# Patient Record
Sex: Female | Born: 1970 | Race: Black or African American | Hispanic: No | Marital: Single | Smoking: Never smoker
Health system: Southern US, Community
[De-identification: ages and names within clinical notes are randomized; demographics above are authoritative.]

## PROBLEM LIST (undated history)

## (undated) DIAGNOSIS — R0600 Dyspnea, unspecified: Secondary | ICD-10-CM

## (undated) DIAGNOSIS — F32A Depression, unspecified: Secondary | ICD-10-CM

## (undated) DIAGNOSIS — Z9289 Personal history of other medical treatment: Secondary | ICD-10-CM

## (undated) DIAGNOSIS — R7303 Prediabetes: Secondary | ICD-10-CM

## (undated) DIAGNOSIS — Z803 Family history of malignant neoplasm of breast: Secondary | ICD-10-CM

## (undated) DIAGNOSIS — K219 Gastro-esophageal reflux disease without esophagitis: Secondary | ICD-10-CM

## (undated) DIAGNOSIS — Z9989 Dependence on other enabling machines and devices: Secondary | ICD-10-CM

## (undated) DIAGNOSIS — I519 Heart disease, unspecified: Secondary | ICD-10-CM

## (undated) DIAGNOSIS — F329 Major depressive disorder, single episode, unspecified: Secondary | ICD-10-CM

## (undated) DIAGNOSIS — B009 Herpesviral infection, unspecified: Secondary | ICD-10-CM

## (undated) DIAGNOSIS — Z8041 Family history of malignant neoplasm of ovary: Secondary | ICD-10-CM

## (undated) DIAGNOSIS — C50911 Malignant neoplasm of unspecified site of right female breast: Secondary | ICD-10-CM

## (undated) DIAGNOSIS — Z801 Family history of malignant neoplasm of trachea, bronchus and lung: Secondary | ICD-10-CM

## (undated) DIAGNOSIS — I951 Orthostatic hypotension: Secondary | ICD-10-CM

## (undated) DIAGNOSIS — I10 Essential (primary) hypertension: Secondary | ICD-10-CM

## (undated) DIAGNOSIS — F419 Anxiety disorder, unspecified: Secondary | ICD-10-CM

## (undated) DIAGNOSIS — T7840XA Allergy, unspecified, initial encounter: Secondary | ICD-10-CM

## (undated) DIAGNOSIS — L409 Psoriasis, unspecified: Secondary | ICD-10-CM

## (undated) HISTORY — DX: Essential (primary) hypertension: I10

## (undated) HISTORY — DX: Anxiety disorder, unspecified: F41.9

## (undated) HISTORY — DX: Psoriasis, unspecified: L40.9

## (undated) HISTORY — DX: Allergy, unspecified, initial encounter: T78.40XA

## (undated) HISTORY — DX: Malignant neoplasm of unspecified site of right female breast: C50.911

## (undated) HISTORY — DX: Depression, unspecified: F32.A

## (undated) HISTORY — DX: Orthostatic hypotension: I95.1

## (undated) HISTORY — DX: Major depressive disorder, single episode, unspecified: F32.9

## (undated) HISTORY — DX: Personal history of other medical treatment: Z92.89

## (undated) HISTORY — DX: Family history of malignant neoplasm of breast: Z80.3

## (undated) HISTORY — DX: Family history of malignant neoplasm of ovary: Z80.41

## (undated) HISTORY — DX: Gastro-esophageal reflux disease without esophagitis: K21.9

## (undated) HISTORY — DX: Heart disease, unspecified: I51.9

## (undated) HISTORY — DX: Family history of malignant neoplasm of trachea, bronchus and lung: Z80.1

## (undated) NOTE — *Deleted (*Deleted)
[facility]  [phone] [phone]     ID: Penny Kim DOB: 1971/06/20  MR#: [id number]  JYN#:[id number]  Patient Care Team: Maryella Shivers as PCP - General (Physician Assistant) End, Cristal Deer, MD as PCP - Cardiology (Cardiology) Magrinat, Valentino Hue, MD as Consulting Physician (Oncology) End, Cristal Deer, MD as Consulting Physician (Cardiology) Felecia Shelling, DPM as Consulting Physician (Podiatry) Kari Baars OTHER MD:  CHIEF COMPLAINT: estrogen receptor positive breast cancer (s/p bilateral mastectomies)  CURRENT TREATMENT: tamoxifen   INTERVAL HISTORY: Penny Kim returns today for follow-up of her estrogen receptor positive breast cancer.   She continues on tamoxifen.  She does not have significant problems from this medication.  She does have increased vaginal wetness and has to wear a pad.  She has some night time hot flashes.  Since her last visit, she underwent lumbar spine MRI on 03/28/2020 after pain from multiple falls. MRI showed: L4-5 left foraminal extrusion impinging on L4 nerve root.   REVIEW OF SYSTEMS: Penny Kim    COVID 19 VACCINATION STATUS:    HISTORY OF CURRENT ILLNESS: From the original intake note:  Penny Kim had noticed that her right breast dropped a little differently than the left, and occasionally she would have a strange sensation radiating to the right nipple, but she really did not pay much attention to this. She had her first mammogram ever, a screening mammogram at [facility], 04/23/2010, and this showed diffuse calcifications in the lower inner quadrant of the right breast measuring up to 5.6 cm. She was recalled 08/08, and Dr. Samuella Cota found the breast to be dense, which limits mammographic sensitivity, but again was able to demonstrate these calcifications throughout the lower inner quadrant of the right breast. Biopsy was discussed, and performed 08/09, and this showed [phone]) ductal carcinoma  in situ, high grade, which was estrogen receptor 100% and progesterone receptor 57% positive.  Bilateral breast MRIs were obtained 08/14. This showed in the right breast the area of enhancement to total 7.7 cm anterior posteriorly. Most of this was nodular and non-mass like, but in addition in the anterior aspect of this, there was a rounded area of mass-like enhancement measuring 1.7 cm. This portion was suspicious for invasive ductal carcinoma. The left breast was unremarkable, and there were no suspicious internal mammary or axillary lymph nodes noted.  Accordingly the patient was brought back for biopsy of the more mass-like area on 05/05/2010, and this biopsy ([id number]) showed a grade 2 invasive ductal carcinoma, which was estrogen receptor at 78% and progesterone receptor 82% positive. The proliferation marker was elevated at 85%. 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  Her subsequent history is as detailed below.   PAST MEDICAL HISTORY: Past Medical History:  Diagnosis Date  . Allergy   . Anxiety   . Breast cancer, right breast (HCC) 02/2010   s/p chemo (completed 09/2010) and right mastectomy w/reconstruction  . Depression   . Family history of breast cancer   . Family history of lung cancer   . Family history of ovarian cancer   . History of stress test    a. treadmill Myoview 11/2016: small defect of mild severity present in the apex location felt to be secondary to breast attenuation. EF 55-65%. Normal study  . HTN (hypertension)   . Orthostatic hypotension   . Psoriasis   . Systolic dysfunction    a. TTE 10/2016: EF 50-55%, no RWMA, normal LV diastolic function, left atrium normal,  RV systolic function normal, PASP normal    PAST SURGICAL HISTORY: Past Surgical History:  Procedure Laterality Date  . BREAST RECONSTRUCTION  01/06/2012   Procedure: BREAST RECONSTRUCTION;  Surgeon: Etter Sjogren, MD;  Location: [facility];   Service: Plastics;  Laterality: Bilateral;  bilateral removal of tissue expanders, bilateral placement of implants--Breast  . BREAST SURGERY  06/19/2010   exploration of right mastectomy site due to post-op bleeding  . INSERTION OF TISSUE EXPANDER AFTER MASTECTOMY  06/19/2010   bilat. mastectomies with bilat. tissue expanders  . PORT-A-CATH REMOVAL  11/26/2010  . PORTACATH PLACEMENT  07/23/2010  . TISSUE EXPANDER REMOVAL  10/20/2010   left    FAMILY HISTORY: Family History  Problem Relation Age of Onset  . Hypertension Mother   . Depression Mother   . Heart attack Mother   . Hyperlipidemia Mother   . Cancer Sister 30       Ovarian cancer  . Hypertension Maternal Grandmother   . Diabetes Maternal Grandmother   . Cancer Maternal Grandfather        Lung cancer - Education officer, environmental and smoker  . Hypertension Maternal Grandfather   . Diabetes Paternal Grandmother   . Hypertension Paternal Grandmother   . Kidney disease Paternal Grandmother        End stage renal dz - dialysis  . Hyperlipidemia Paternal Grandmother   . Breast cancer Paternal Grandmother   . Alcohol abuse Father   . Depression Father   . Drug abuse Father 69       Died of drug overdose  The patient's father died at the age of 37 from drug overdose in the setting of ETOH abuse. The patient's mother died in 13-Mar-2016 at age 43. She has a significant history of depression. The patient has one sister, also with a history of depression, who was diagnosed with ovarian cancer at age 34.   GYNECOLOGIC HISTORY:  No LMP recorded. (Menstrual status: Other). GX P 0 LMP irregular following chemotherapy (since 10/2013) Hysterectomy? no BSO? no   SOCIAL HISTORY: (updated 06/2019)  Penny Kim is currently on disability.When asked who she would call in case of a problem, she really does not have anyone that she would call. She does not feel her family is supportive, and she has no close friends.     ADVANCED DIRECTIVES: not in place    HEALTH MAINTENANCE: Social History   Tobacco Use  . Smoking status: Never Smoker  . Smokeless tobacco: Never Used  Vaping Use  . Vaping Use: Never used  Substance Use Topics  . Alcohol use: No    Comment: occasionally  . Drug use: No     Colonoscopy: n/a  PAP: 11/2017, negative  Bone density: n/a   Allergies  Allergen Reactions  . Prochlorperazine Other (See Comments)    SYNCOPE SYNCOPE  . Methadone Hcl Itching  . Other   . Propranolol     rash  . Tape Rash    Current Outpatient Medications  Medication Sig Dispense Refill  . amLODipine (NORVASC) 10 MG tablet Take 1 tablet (10 mg total) by mouth daily. 90 tablet 3  . busPIRone (BUSPAR) 10 MG tablet Take 2 tablets (20 mg total) by mouth 3 (three) times daily. 540 tablet 0  . clobetasol ointment (TEMOVATE) 0.05 % Apply 1 application topically 2 (two) times daily. 60 g 3  . cyclobenzaprine (FLEXERIL) 10 MG tablet Take 10 mg by mouth at bedtime.    . fluticasone (FLONASE) 50 MCG/ACT nasal  spray SPRAY 2 SPRAYS INTO EACH NOSTRIL EVERY DAY 48 mL 0  . hydrochlorothiazide (MICROZIDE) 12.5 MG capsule TAKE 1 CAPSULE BY MOUTH EVERY DAY 90 capsule 3  . meloxicam (MOBIC) 7.5 MG tablet Take 1 tablet (7.5 mg total) by mouth daily. 30 tablet 0  . methocarbamol (ROBAXIN) 500 MG tablet Take 1 tablet (500 mg total) by mouth 2 (two) times daily as needed for up to 15 days for muscle spasms. 30 tablet 0  . tamoxifen (NOLVADEX) 20 MG tablet Take 1 tablet (20 mg total) by mouth daily. 90 tablet 4  . traMADol (ULTRAM) 50 MG tablet Take 1 tablet (50 mg total) by mouth every 8 (eight) hours as needed. 6 tablet 0  . traZODone (DESYREL) 100 MG tablet TAKE 1 TABLET BY MOUTH EVERYDAY AT BEDTIME 90 tablet 0  . vortioxetine HBr (TRINTELLIX) 20 MG TABS tablet Take 1 tablet (20 mg total) by mouth daily. 90 tablet 1   No current facility-administered medications for this visit.    OBJECTIVE: Young African-American Kim using a cane  There were no  vitals filed for this visit.   There is no height or weight on file to calculate BMI.   Wt Readings from Last 3 Encounters:  07/15/20 138 lb (62.6 kg)  01/23/20 146 lb (66.2 kg)  11/23/19 150 lb (68 kg)      ECOG FS:3 - Symptomatic, >50% confined to bed  Sclerae unicteric, EOMs intact Wearing a mask No cervical or supraclavicular adenopathy Lungs no rales or rhonchi Heart regular rate and rhythm Abd soft, nontender, positive bowel sounds MSK no focal spinal tenderness, no upper extremity lymphedema Neuro: nonfocal, well oriented, appropriate affect Breasts:    {Ocular: Sclerae unicteric, EOMs intact Ear-nose-throat: Using a mask Lymphatic: No cervical or supraclavicular adenopathy Lungs no rales or rhonchi Heart regular rate and rhythm Abd soft, nontender, positive bowel sounds MSK no focal spinal tenderness, no joint edema Neuro: non-focal, well-oriented, flat affect, somewhat slow responses Breasts: Status post bilateral mastectomies with bilateral implant reconstruction.  There is no evidence of chest wall recurrence.  Both axillae are benign. }   LAB RESULTS:  CMP     Component Value Date/Time   NA 136 01/23/2020 1419   NA 138 10/13/2015 1406   K 3.4 (L) 01/23/2020 1419   K 3.6 10/13/2015 1406   CL 96 01/23/2020 1419   CL 105 01/30/2013 1313   CO2 22 01/23/2020 1419   CO2 27 10/13/2015 1406   GLUCOSE 102 (H) 01/23/2020 1419   GLUCOSE 115 (H) 11/23/2019 1229   GLUCOSE 114 10/13/2015 1406   GLUCOSE 94 01/30/2013 1313   BUN 9 01/23/2020 1419   BUN 10.2 10/13/2015 1406   CREATININE 1.05 (H) 01/23/2020 1419   CREATININE 1.0 10/13/2015 1406   CALCIUM 10.0 01/23/2020 1419   CALCIUM 9.5 10/13/2015 1406   PROT 7.8 01/23/2020 1419   PROT 7.4 10/13/2015 1406   ALBUMIN 4.7 01/23/2020 1419   ALBUMIN 4.0 10/13/2015 1406   AST 24 01/23/2020 1419   AST 22 10/13/2015 1406   ALT 15 01/23/2020 1419   ALT 15 10/13/2015 1406   ALKPHOS 48 01/23/2020 1419   ALKPHOS 38 (L)  10/13/2015 1406   BILITOT 0.3 01/23/2020 1419   BILITOT <0.30 10/13/2015 1406   GFRNONAA 63 01/23/2020 1419   GFRAA 73 01/23/2020 1419    Lab Results  Component Value Date   WBC 7.9 01/23/2020   NEUTROABS 5.1 01/23/2020   HGB 11.6 01/23/2020   HCT  34.5 01/23/2020   MCV 81 01/23/2020   PLT 291 01/23/2020   Lab Results  Component Value Date   LABCA2 11 09/25/2012    No components found for: [id number]  No results for input(s): INR in the last 168 hours.  Lab Results  Component Value Date   LABCA2 11 09/25/2012    No results found for: WUJ811  No results found for: BJY782  No results found for: NFA213  No results found for: CA2729  No components found for: HGQUANT  No results found for: CEA1 / No results found for: CEA1   No results found for: AFPTUMOR  No results found for: CHROMOGRNA  No results found for: TOTALPROTELP, ALBUMINELP, A1GS, A2GS, BETS, BETA2SER, GAMS, MSPIKE, SPEI (this displays SPEP labs)  No results found for: KPAFRELGTCHN, LAMBDASER, KAPLAMBRATIO (kappa/lambda light chains)  No results found for: HGBA, HGBA2QUANT, HGBFQUANT, HGBSQUAN (Hemoglobinopathy evaluation)   No results found for: LDH  No results found for: IRON, TIBC, IRONPCTSAT (Iron and TIBC)  No results found for: FERRITIN  Urinalysis    Component Value Date/Time   COLORURINE YELLOW (A) 11/17/2019 2054   APPEARANCEUR CLEAR (A) 11/17/2019 2054   LABSPEC 1.014 11/17/2019 2054   LABSPEC 1.005 08/07/2013 1155   PHURINE 6.0 11/17/2019 2054   GLUCOSEU NEGATIVE 11/17/2019 2054   GLUCOSEU Negative 08/07/2013 1155   HGBUR NEGATIVE 11/17/2019 2054   BILIRUBINUR NEGATIVE 11/17/2019 2054   BILIRUBINUR Negative 08/07/2013 1155   KETONESUR NEGATIVE 11/17/2019 2054   PROTEINUR NEGATIVE 11/17/2019 2054   UROBILINOGEN 0.2 08/07/2013 1155   NITRITE NEGATIVE 11/17/2019 2054   LEUKOCYTESUR NEGATIVE 11/17/2019 2054   LEUKOCYTESUR Negative 08/07/2013 1155    STUDIES: No results  found.   ASSESSMENT: 89 y.o. 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   (3) She did not require radiation.   (4) She started tamoxifenin March 2012          (a) stopped tamoxifen January 2018  (b) resumed tamoxifen January 2019  (5) genetic testing on 06/27/2018  (a) Previous BRCA2 VUS was reclassified to favor polymorphism on 05/06/2014.  The remainder of the BRCA1 and BRCA2 genes were negative through [facility].  No BART.   (b)Updated genetic testing 07/05/2018-through the [facility] gene panel offered by [employer] found no deleterious mutations in APC, ATM, BARD1, BMPR1A, BRCA1, BRCA2, BRIP1, CHD1, CDK4, CDKN2A, CHEK2, EPCAM (large rearrangement only), GREM1, HOXB13, AXIN2, MSH3, NTHL1, RNF43, GALNT12, RSP20, MLH1, MSH2, MSH6, MUTYH, NBN, PALB2, PMS2, PTEN, RAD51C, RAD51D, SMAD4, STK11, and TP53. Sequencing was performed for select regions of POLE and POLD1, and large rearrangement analysis was performed for select regions of GREM1.   PLAN: Penny Kim is now just over 9 years out from definitive surgery for her breast cancer with no evidence of disease recurrence.  This is very favorable.  The plan is to continue tamoxifen through March 2023.  She is tolerating that generally well.  She has multiple issues including avascular necrosis of both hips, significant limitations secondary to her psychological/mood problems, and other concerns listed in the review of systems above which unfortunately do severely limit her functional capacity.  She brought a  formal disability evaluation form to  me.  I explained that I do not do such things as measuring what reach she has or how long she can stand etc.  I do think that in my opinion it would not be possible for her to hold a job that requires any physical strength or persistent mental acuity.  She will see me again in 1 year.  She knows to call for any other issue that may develop before then.   Kari Baars   07/25/2020 12:27 AM Medical Oncology and Hematology [facility] [address] [place], [place] [address] Tel. [phone]    Fax. [phone]   I, Mickie Bail, am acting as scribe for Dr. Valentino Hue. Magrinat.  I, Ruthann Cancer MD, have reviewed the above documentation for accuracy and completeness, and I agree with the above.   *Total Encounter Time as defined by the Centers for Medicare and Medicaid Services includes, in addition to the face-to-face time of a patient visit (documented in the note above) non-face-to-face time: obtaining and reviewing outside history, ordering and reviewing medications, tests or procedures, care coordination (communications with other health care professionals or caregivers) and documentation in the medical record.

## (undated) NOTE — *Deleted (*Deleted)
[facility]  [phone] Fax:([phone]     ID: Penny Kim DOB: 10-08-1970  MR#: [id number]  JYN#:[id number]  Patient Care Team: Maryella Shivers as PCP - General (Physician Assistant) End, Cristal Deer, MD as PCP - Cardiology (Cardiology) Magrinat, Valentino Hue, MD as Consulting Physician (Oncology) End, Cristal Deer, MD as Consulting Physician (Cardiology) Felecia Shelling, DPM as Consulting Physician (Podiatry) Kari Baars OTHER MD:  CHIEF COMPLAINT: estrogen receptor positive breast cancer (s/p bilateral mastectomies)  CURRENT TREATMENT: tamoxifen   INTERVAL HISTORY: Penny Kim returns today for follow-up of her estrogen receptor positive breast cancer.   She continues on tamoxifen.  She does not have significant problems from this medication.  She does have increased vaginal wetness and has to wear a pad.  She has some night time hot flashes.  Since her last visit, she underwent head CT on 11/23/2019 for multiple falls showing no acute findings.  She also underwent lumbar spine MRI on 03/28/2020 for back pain showing: L4-5 left foraminal extrusion impinging on L4 nerve root.   REVIEW OF SYSTEMS: Penny Kim    COVID 19 VACCINATION STATUS:    HISTORY OF CURRENT ILLNESS: From the original intake note:  Penny Kim had noticed that her right breast dropped a little differently than the left, and occasionally she would have a strange sensation radiating to the right nipple, but she really did not pay much attention to this. She had her first mammogram ever, a screening mammogram at [facility], 04/23/2010, and this showed diffuse calcifications in the lower inner quadrant of the right breast measuring up to 5.6 cm. She was recalled 08/08, and Dr. Samuella Cota found the breast to be dense, which limits mammographic sensitivity, but again was able to demonstrate these calcifications throughout the lower inner quadrant of the right breast. Biopsy was discussed, and  performed 08/09, and this showed [phone]) ductal carcinoma in situ, high grade, which was estrogen receptor 100% and progesterone receptor 57% positive.  Bilateral breast MRIs were obtained 08/14. This showed in the right breast the area of enhancement to total 7.7 cm anterior posteriorly. Most of this was nodular and non-mass like, but in addition in the anterior aspect of this, there was a rounded area of mass-like enhancement measuring 1.7 cm. This portion was suspicious for invasive ductal carcinoma. The left breast was unremarkable, and there were no suspicious internal mammary or axillary lymph nodes noted.  Accordingly the patient was brought back for biopsy of the more mass-like area on 05/05/2010, and this biopsy ([id number]) showed a grade 2 invasive ductal carcinoma, which was estrogen receptor at 78% and progesterone receptor 82% positive. The proliferation marker was elevated at 85%. 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  Her subsequent history is as detailed below.   PAST MEDICAL HISTORY: Past Medical History:  Diagnosis Date  . Allergy   . Anxiety   . Breast cancer, right breast (HCC) 02/2010   s/p chemo (completed 09/2010) and right mastectomy w/reconstruction  . Depression   . Family history of breast cancer   . Family history of lung cancer   . Family history of ovarian cancer   . History of stress test    a. treadmill Myoview 11/2016: small defect of mild severity present in the apex location felt to be secondary to breast attenuation. EF 55-65%. Normal study  . HTN (hypertension)   . Orthostatic hypotension   . Psoriasis   . Systolic dysfunction    a. TTE  10/2016: EF 50-55%, no RWMA, normal LV diastolic function, left atrium normal, RV systolic function normal, PASP normal    PAST SURGICAL HISTORY: Past Surgical History:  Procedure Laterality Date  . BREAST RECONSTRUCTION  01/06/2012   Procedure: BREAST RECONSTRUCTION;   Surgeon: Etter Sjogren, MD;  Location: [facility];  Service: Plastics;  Laterality: Bilateral;  bilateral removal of tissue expanders, bilateral placement of implants--Breast  . BREAST SURGERY  06/19/2010   exploration of right mastectomy site due to post-op bleeding  . INSERTION OF TISSUE EXPANDER AFTER MASTECTOMY  06/19/2010   bilat. mastectomies with bilat. tissue expanders  . PORT-A-CATH REMOVAL  11/26/2010  . PORTACATH PLACEMENT  07/23/2010  . TISSUE EXPANDER REMOVAL  10/20/2010   left    FAMILY HISTORY: Family History  Problem Relation Age of Onset  . Hypertension Mother   . Depression Mother   . Heart attack Mother   . Hyperlipidemia Mother   . Cancer Sister 30       Ovarian cancer  . Hypertension Maternal Grandmother   . Diabetes Maternal Grandmother   . Cancer Maternal Grandfather        Lung cancer - Education officer, environmental and smoker  . Hypertension Maternal Grandfather   . Diabetes Paternal Grandmother   . Hypertension Paternal Grandmother   . Kidney disease Paternal Grandmother        End stage renal dz - dialysis  . Hyperlipidemia Paternal Grandmother   . Breast cancer Paternal Grandmother   . Alcohol abuse Father   . Depression Father   . Drug abuse Father 57       Died of drug overdose  The patient's father died at the age of 8 from drug overdose in the setting of ETOH abuse. The patient's mother died in 01-Mar-2016 at age 21. She has a significant history of depression. The patient has one sister, also with a history of depression, who was diagnosed with ovarian cancer at age 86.   GYNECOLOGIC HISTORY:  No LMP recorded. (Menstrual status: Other). GX P 0 LMP irregular following chemotherapy (since 10/2013) Hysterectomy? no BSO? no   SOCIAL HISTORY: (updated 06/2019)  Penny Kim is currently on disability.When asked who she would call in case of a problem, she really does not have anyone that she would call. She does not feel her family is supportive, and she has  no close friends.     ADVANCED DIRECTIVES: not in place   HEALTH MAINTENANCE: Social History   Tobacco Use  . Smoking status: Never Smoker  . Smokeless tobacco: Never Used  Vaping Use  . Vaping Use: Never used  Substance Use Topics  . Alcohol use: No    Comment: occasionally  . Drug use: No     Colonoscopy: n/a  PAP: 11/2017, negative  Bone density: n/a   Allergies  Allergen Reactions  . Prochlorperazine Other (See Comments)    SYNCOPE SYNCOPE  . Methadone Hcl Itching  . Other   . Propranolol     rash  . Tape Rash    Current Outpatient Medications  Medication Sig Dispense Refill  . amLODipine (NORVASC) 10 MG tablet Take 1 tablet (10 mg total) by mouth daily. 90 tablet 3  . busPIRone (BUSPAR) 10 MG tablet Take 2 tablets (20 mg total) by mouth 3 (three) times daily. 540 tablet 0  . clobetasol ointment (TEMOVATE) 0.05 % Apply 1 application topically 2 (two) times daily. 60 g 3  . cyclobenzaprine (FLEXERIL) 10 MG tablet Take 10 mg by  mouth at bedtime.    . fluticasone (FLONASE) 50 MCG/ACT nasal spray SPRAY 2 SPRAYS INTO EACH NOSTRIL EVERY DAY 48 mL 0  . hydrochlorothiazide (MICROZIDE) 12.5 MG capsule TAKE 1 CAPSULE BY MOUTH EVERY DAY 90 capsule 3  . meloxicam (MOBIC) 7.5 MG tablet Take 1 tablet (7.5 mg total) by mouth daily. 30 tablet 0  . tamoxifen (NOLVADEX) 20 MG tablet Take 1 tablet (20 mg total) by mouth daily. 90 tablet 4  . traMADol (ULTRAM) 50 MG tablet Take 1 tablet (50 mg total) by mouth every 8 (eight) hours as needed. 6 tablet 0  . traZODone (DESYREL) 100 MG tablet TAKE 1 TABLET BY MOUTH EVERYDAY AT BEDTIME 90 tablet 0  . vortioxetine HBr (TRINTELLIX) 20 MG TABS tablet Take 1 tablet (20 mg total) by mouth daily. 90 tablet 1   No current facility-administered medications for this visit.    OBJECTIVE: Young African-American woman using a cane  There were no vitals filed for this visit.   There is no height or weight on file to calculate BMI.   Wt Readings  from Last 3 Encounters:  07/15/20 138 lb (62.6 kg)  01/23/20 146 lb (66.2 kg)  11/23/19 150 lb (68 kg)      ECOG FS:3 - Symptomatic, >50% confined to bed  Sclerae unicteric, EOMs intact Wearing a mask No cervical or supraclavicular adenopathy Lungs no rales or rhonchi Heart regular rate and rhythm Abd soft, nontender, positive bowel sounds MSK no focal spinal tenderness, no upper extremity lymphedema Neuro: nonfocal, well oriented, appropriate affect Breasts:    {Ocular: Sclerae unicteric, EOMs intact Ear-nose-throat: Using a mask Lymphatic: No cervical or supraclavicular adenopathy Lungs no rales or rhonchi Heart regular rate and rhythm Abd soft, nontender, positive bowel sounds MSK no focal spinal tenderness, no joint edema Neuro: non-focal, well-oriented, flat affect, somewhat slow responses Breasts: Status post bilateral mastectomies with bilateral implant reconstruction.  There is no evidence of chest wall recurrence.  Both axillae are benign. }   LAB RESULTS:  CMP     Component Value Date/Time   NA 136 01/23/2020 1419   NA 138 10/13/2015 1406   K 3.4 (L) 01/23/2020 1419   K 3.6 10/13/2015 1406   CL 96 01/23/2020 1419   CL 105 01/30/2013 1313   CO2 22 01/23/2020 1419   CO2 27 10/13/2015 1406   GLUCOSE 102 (H) 01/23/2020 1419   GLUCOSE 115 (H) 11/23/2019 1229   GLUCOSE 114 10/13/2015 1406   GLUCOSE 94 01/30/2013 1313   BUN 9 01/23/2020 1419   BUN 10.2 10/13/2015 1406   CREATININE 1.05 (H) 01/23/2020 1419   CREATININE 1.0 10/13/2015 1406   CALCIUM 10.0 01/23/2020 1419   CALCIUM 9.5 10/13/2015 1406   PROT 7.8 01/23/2020 1419   PROT 7.4 10/13/2015 1406   ALBUMIN 4.7 01/23/2020 1419   ALBUMIN 4.0 10/13/2015 1406   AST 24 01/23/2020 1419   AST 22 10/13/2015 1406   ALT 15 01/23/2020 1419   ALT 15 10/13/2015 1406   ALKPHOS 48 01/23/2020 1419   ALKPHOS 38 (L) 10/13/2015 1406   BILITOT 0.3 01/23/2020 1419   BILITOT <0.30 10/13/2015 1406   GFRNONAA 63  01/23/2020 1419   GFRAA 73 01/23/2020 1419    Lab Results  Component Value Date   WBC 7.9 01/23/2020   NEUTROABS 5.1 01/23/2020   HGB 11.6 01/23/2020   HCT 34.5 01/23/2020   MCV 81 01/23/2020   PLT 291 01/23/2020    Lab Results  Component Value  Date   LABCA2 11 09/25/2012    No components found for: [id number]  No results for input(s): INR in the last 168 hours.  Lab Results  Component Value Date   LABCA2 11 09/25/2012    No results found for: WUJ811  No results found for: BJY782  No results found for: NFA213  No results found for: CA2729  No components found for: HGQUANT  No results found for: CEA1 / No results found for: CEA1   No results found for: AFPTUMOR  No results found for: CHROMOGRNA  No results found for: TOTALPROTELP, ALBUMINELP, A1GS, A2GS, BETS, BETA2SER, GAMS, MSPIKE, SPEI (this displays SPEP labs)  No results found for: KPAFRELGTCHN, LAMBDASER, KAPLAMBRATIO (kappa/lambda light chains)  No results found for: HGBA, HGBA2QUANT, HGBFQUANT, HGBSQUAN (Hemoglobinopathy evaluation)   No results found for: LDH  No results found for: IRON, TIBC, IRONPCTSAT (Iron and TIBC)  No results found for: FERRITIN  Urinalysis    Component Value Date/Time   COLORURINE YELLOW (A) 11/17/2019 2054   APPEARANCEUR CLEAR (A) 11/17/2019 2054   LABSPEC 1.014 11/17/2019 2054   LABSPEC 1.005 08/07/2013 1155   PHURINE 6.0 11/17/2019 2054   GLUCOSEU NEGATIVE 11/17/2019 2054   GLUCOSEU Negative 08/07/2013 1155   HGBUR NEGATIVE 11/17/2019 2054   BILIRUBINUR NEGATIVE 11/17/2019 2054   BILIRUBINUR Negative 08/07/2013 1155   KETONESUR NEGATIVE 11/17/2019 2054   PROTEINUR NEGATIVE 11/17/2019 2054   UROBILINOGEN 0.2 08/07/2013 1155   NITRITE NEGATIVE 11/17/2019 2054   LEUKOCYTESUR NEGATIVE 11/17/2019 2054   LEUKOCYTESUR Negative 08/07/2013 1155    STUDIES: No results found.   ASSESSMENT: 71 y.o. 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   (3) She did not require radiation.   (4) She started tamoxifenin March 2012          (a) stopped tamoxifen January 2018  (b) resumed tamoxifen January 2019  (5) genetic testing on 06/27/2018  (a) Previous BRCA2 VUS was reclassified to favor polymorphism on 05/06/2014.  The remainder of the BRCA1 and BRCA2 genes were negative through [facility].  No BART.   (b)Updated genetic testing 07/05/2018-through the [facility] gene panel offered by [facility] found no deleterious mutations in APC, ATM, BARD1, BMPR1A, BRCA1, BRCA2, BRIP1, CHD1, CDK4, CDKN2A, CHEK2, EPCAM (large rearrangement only), GREM1, HOXB13, AXIN2, MSH3, NTHL1, RNF43, GALNT12, RSP20, MLH1, MSH2, MSH6, MUTYH, NBN, PALB2, PMS2, PTEN, RAD51C, RAD51D, SMAD4, STK11, and TP53. Sequencing was performed for select regions of POLE and POLD1, and large rearrangement analysis was performed for select regions of GREM1.   PLAN: Penny Kim is now just over 9 years out from definitive surgery for her breast cancer with no evidence of disease recurrence.  This is very favorable.  The plan is to continue tamoxifen through March 2023.  She is tolerating that generally well.  She has multiple issues including avascular necrosis of both hips, significant limitations secondary to her psychological/mood problems, and other concerns listed in the review of systems above which unfortunately do severely limit her functional capacity.  She brought a formal disability evaluation form to me.  I explained that I do not do such things as  measuring what reach she has or how  long she can stand etc.  I do think that in my opinion it would not be possible for her to hold a job that requires any physical strength or persistent mental acuity.  She will see me again in 1 year.  She knows to call for any other issue that may develop before then.   Kari Baars   08/08/2020 3:12 AM Medical Oncology and Hematology [facility] [address] [place], [place] [address] Tel. [phone]    Fax. [phone]   I, Mickie Bail, am acting as scribe for Dr. Valentino Hue. Magrinat.  I, Ruthann Cancer MD, have reviewed the above documentation for accuracy and completeness, and I agree with the above.   *Total Encounter Time as defined by the Centers for Medicare and Medicaid Services includes, in addition to the face-to-face time of a patient visit (documented in the note above) non-face-to-face time: obtaining and reviewing outside history, ordering and reviewing medications, tests or procedures, care coordination (communications with other health care professionals or caregivers) and documentation in the medical record.

## (undated) NOTE — *Deleted (*Deleted)
[facility]  [phone] [phone]     ID: Penny Kim DOB: 07-14-1971  MR#: [id number]  QIO#:[id number]  Patient Care Team: Yvonne Kendall, MD as PCP - Cardiology (Cardiology) Magrinat, Valentino Hue, MD as Consulting Physician (Oncology) End, Cristal Deer, MD as Consulting Physician (Cardiology) Felecia Shelling, DPM as Consulting Physician (Podiatry) Kari Baars OTHER MD:  CHIEF COMPLAINT: estrogen receptor positive breast cancer (s/p bilateral mastectomies)  CURRENT TREATMENT: tamoxifen   INTERVAL HISTORY: Penny Kim returns today for follow-up of her estrogen receptor positive breast cancer.   She continues on tamoxifen.  She does not have significant problems from this medication.  She does have increased vaginal wetness and has to wear a pad.  She has some night time hot flashes.   REVIEW OF SYSTEMS: Penny Kim    COVID 19 VACCINATION STATUS:    HISTORY OF CURRENT ILLNESS: From the original intake note:  Blythe had noticed that her right breast dropped a little differently than the left, and occasionally she would have a strange sensation radiating to the right nipple, but she really did not pay much attention to this. She had her first mammogram ever, a screening mammogram at [facility], 04/23/2010, and this showed diffuse calcifications in the lower inner quadrant of the right breast measuring up to 5.6 cm. She was recalled 08/08, and Dr. Samuella Cota found the breast to be dense, which limits mammographic sensitivity, but again was able to demonstrate these calcifications throughout the lower inner quadrant of the right breast. Biopsy was discussed, and performed 08/09, and this showed [id number] ductal carcinoma in situ, high grade, which was estrogen receptor 100% and progesterone receptor 57% positive.  Bilateral breast MRIs were obtained 08/14. This showed in the right breast the area of enhancement to total 7.7 cm anterior  posteriorly. Most of this was nodular and non-mass like, but in addition in the anterior aspect of this, there was a rounded area of mass-like enhancement measuring 1.7 cm. This portion was suspicious for invasive ductal carcinoma. The left breast was unremarkable, and there were no suspicious internal mammary or axillary lymph nodes noted.  Accordingly the patient was brought back for biopsy of the more mass-like area on 05/05/2010, and this biopsy ([id number]) showed a grade 2 invasive ductal carcinoma, which was estrogen receptor at 78% and progesterone receptor 82% positive. The proliferation marker was elevated at 85%. 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  Her subsequent history is as detailed below.   PAST MEDICAL HISTORY: Past Medical History:  Diagnosis Date  . Allergy   . Anxiety   . Breast cancer, right breast (HCC) 02/2010   s/p chemo (completed 09/2010) and right mastectomy w/reconstruction  . Depression   . Family history of breast cancer   . Family history of lung cancer   . Family history of ovarian cancer   . History of stress test    a. treadmill Myoview 11/2016: small defect of mild severity present in the apex location felt to be secondary to breast attenuation. EF 55-65%. Normal study  . HTN (hypertension)   . Orthostatic hypotension   . Psoriasis   . Systolic dysfunction    a. TTE 10/2016: EF 50-55%, no RWMA, normal LV diastolic function, left atrium normal, RV systolic function normal, PASP normal    PAST SURGICAL HISTORY: Past Surgical History:  Procedure Laterality Date  . BREAST RECONSTRUCTION  01/06/2012   Procedure: BREAST RECONSTRUCTION;  Surgeon: Etter Sjogren, MD;  Location: [facility];  Service: Plastics;  Laterality: Bilateral;  bilateral removal of tissue expanders, bilateral placement of implants--Breast  . BREAST SURGERY  06/19/2010   exploration of right mastectomy site due to post-op bleeding  .  INSERTION OF TISSUE EXPANDER AFTER MASTECTOMY  06/19/2010   bilat. mastectomies with bilat. tissue expanders  . PORT-A-CATH REMOVAL  11/26/2010  . PORTACATH PLACEMENT  07/23/2010  . TISSUE EXPANDER REMOVAL  10/20/2010   left    FAMILY HISTORY: Family History  Problem Relation Age of Onset  . Hypertension Mother   . Depression Mother   . Heart attack Mother   . Hyperlipidemia Mother   . Cancer Sister 30       Ovarian cancer  . Hypertension Maternal Grandmother   . Diabetes Maternal Grandmother   . Cancer Maternal Grandfather        Lung cancer - Education officer, environmental and smoker  . Hypertension Maternal Grandfather   . Diabetes Paternal Grandmother   . Hypertension Paternal Grandmother   . Kidney disease Paternal Grandmother        End stage renal dz - dialysis  . Hyperlipidemia Paternal Grandmother   . Breast cancer Paternal Grandmother   . Alcohol abuse Father   . Depression Father   . Drug abuse Father 8       Died of drug overdose  The patient's father died at the age of 62 from drug overdose in the setting of ETOH abuse. The patient's mother died in 02/12/16 at age 54. She has a significant history of depression. The patient has one sister, also with a history of depression, who was diagnosed with ovarian cancer at age 31.   GYNECOLOGIC HISTORY:  No LMP recorded. (Menstrual status: Other). GX P 0 LMP irregular following chemotherapy (since 10/2013) Hysterectomy? no BSO? no   SOCIAL HISTORY: (updated 06/2019)  Penny Kim is currently on disability.When asked who she would call in case of a problem, she really does not have anyone that she would call. She does not feel her family is supportive, and she has no close friends.     ADVANCED DIRECTIVES: not in place   HEALTH MAINTENANCE: Social History   Tobacco Use  . Smoking status: Never Smoker  . Smokeless tobacco: Never Used  Vaping Use  . Vaping Use: Never used  Substance Use Topics  . Alcohol use: No    Comment:  occasionally  . Drug use: No     Colonoscopy: n/a  PAP: 11/2017, negative  Bone density: n/a   Allergies  Allergen Reactions  . Prochlorperazine Other (See Comments)    SYNCOPE SYNCOPE  . Methadone Hcl Itching  . Other   . Propranolol     rash  . Tape Rash    Current Outpatient Medications  Medication Sig Dispense Refill  . amLODipine (NORVASC) 10 MG tablet Take 1 tablet (10 mg total) by mouth daily. 90 tablet 3  . busPIRone (BUSPAR) 10 MG tablet Take 2 tablets (20 mg total) by mouth 3 (three) times daily. 540 tablet 0  . clobetasol ointment (TEMOVATE) 0.05 % Apply 1 application topically 2 (two) times daily. 60 g 3  . cyclobenzaprine (FLEXERIL) 10 MG tablet Take 10 mg by mouth at bedtime.    . fluticasone (FLONASE) 50 MCG/ACT nasal spray SPRAY 2 SPRAYS INTO EACH NOSTRIL EVERY DAY 48 mL 0  . hydrochlorothiazide (MICROZIDE) 12.5 MG capsule TAKE 1 CAPSULE BY MOUTH EVERY DAY 90 capsule 3  . meloxicam (MOBIC) 7.5 MG tablet Take 1  tablet (7.5 mg total) by mouth daily. 30 tablet 0  . tamoxifen (NOLVADEX) 20 MG tablet Take 1 tablet (20 mg total) by mouth daily. 90 tablet 4  . traMADol (ULTRAM) 50 MG tablet Take 1 tablet (50 mg total) by mouth every 8 (eight) hours as needed. 6 tablet 0  . traZODone (DESYREL) 100 MG tablet TAKE 1 TABLET BY MOUTH EVERYDAY AT BEDTIME 90 tablet 0  . vortioxetine HBr (TRINTELLIX) 20 MG TABS tablet Take 1 tablet (20 mg total) by mouth daily. 90 tablet 1   No current facility-administered medications for this visit.    OBJECTIVE: Young African-American woman using a cane  There were no vitals filed for this visit.   There is no height or weight on file to calculate BMI.   Wt Readings from Last 3 Encounters:  07/15/20 138 lb (62.6 kg)  01/23/20 146 lb (66.2 kg)  11/23/19 150 lb (68 kg)      ECOG FS:3 - Symptomatic, >50% confined to bed  Sclerae unicteric, EOMs intact Wearing a mask No cervical or supraclavicular adenopathy Lungs no rales or rhonchi  Heart regular rate and rhythm Abd soft, nontender, positive bowel sounds MSK no focal spinal tenderness, no upper extremity lymphedema Neuro: nonfocal, well oriented, appropriate affect Breasts:    {Ocular: Sclerae unicteric, EOMs intact Ear-nose-throat: Using a mask Lymphatic: No cervical or supraclavicular adenopathy Lungs no rales or rhonchi Heart regular rate and rhythm Abd soft, nontender, positive bowel sounds MSK no focal spinal tenderness, no joint edema Neuro: non-focal, well-oriented, flat affect, somewhat slow responses Breasts: Status post bilateral mastectomies with bilateral implant reconstruction.  There is no evidence of chest wall recurrence.  Both axillae are benign.}   LAB RESULTS:  CMP     Component Value Date/Time   NA 136 01/23/2020 1419   NA 138 10/13/2015 1406   K 3.4 (L) 01/23/2020 1419   K 3.6 10/13/2015 1406   CL 96 01/23/2020 1419   CL 105 01/30/2013 1313   CO2 22 01/23/2020 1419   CO2 27 10/13/2015 1406   GLUCOSE 102 (H) 01/23/2020 1419   GLUCOSE 115 (H) 11/23/2019 1229   GLUCOSE 114 10/13/2015 1406   GLUCOSE 94 01/30/2013 1313   BUN 9 01/23/2020 1419   BUN 10.2 10/13/2015 1406   CREATININE 1.05 (H) 01/23/2020 1419   CREATININE 1.0 10/13/2015 1406   CALCIUM 10.0 01/23/2020 1419   CALCIUM 9.5 10/13/2015 1406   PROT 7.8 01/23/2020 1419   PROT 7.4 10/13/2015 1406   ALBUMIN 4.7 01/23/2020 1419   ALBUMIN 4.0 10/13/2015 1406   AST 24 01/23/2020 1419   AST 22 10/13/2015 1406   ALT 15 01/23/2020 1419   ALT 15 10/13/2015 1406   ALKPHOS 48 01/23/2020 1419   ALKPHOS 38 (L) 10/13/2015 1406   BILITOT 0.3 01/23/2020 1419   BILITOT <0.30 10/13/2015 1406   GFRNONAA 63 01/23/2020 1419   GFRAA 73 01/23/2020 1419    Lab Results  Component Value Date   WBC 7.9 01/23/2020   NEUTROABS 5.1 01/23/2020   HGB 11.6 01/23/2020   HCT 34.5 01/23/2020   MCV 81 01/23/2020   PLT 291 01/23/2020    Lab Results  Component Value Date   LABCA2 11  09/25/2012    No components found for: [id number]  No results for input(s): INR in the last 168 hours.  Lab Results  Component Value Date   LABCA2 11 09/25/2012    No results found for: WUJ811  No results found for: BJY782  No results found for: ZOX096  No results found for: CA2729  No components found for: HGQUANT  No results found for: CEA1 / No results found for: CEA1   No results found for: AFPTUMOR  No results found for: CHROMOGRNA  No results found for: TOTALPROTELP, ALBUMINELP, A1GS, A2GS, BETS, BETA2SER, GAMS, MSPIKE, SPEI (this displays SPEP labs)  No results found for: KPAFRELGTCHN, LAMBDASER, KAPLAMBRATIO (kappa/lambda light chains)  No results found for: HGBA, HGBA2QUANT, HGBFQUANT, HGBSQUAN (Hemoglobinopathy evaluation)   No results found for: LDH  No results found for: IRON, TIBC, IRONPCTSAT (Iron and TIBC)  No results found for: FERRITIN  Urinalysis    Component Value Date/Time   COLORURINE YELLOW (A) 11/17/2019 2054   APPEARANCEUR CLEAR (A) 11/17/2019 2054   LABSPEC 1.014 11/17/2019 2054   LABSPEC 1.005 08/07/2013 1155   PHURINE 6.0 11/17/2019 2054   GLUCOSEU NEGATIVE 11/17/2019 2054   GLUCOSEU Negative 08/07/2013 1155   HGBUR NEGATIVE 11/17/2019 2054   BILIRUBINUR NEGATIVE 11/17/2019 2054   BILIRUBINUR Negative 08/07/2013 1155   KETONESUR NEGATIVE 11/17/2019 2054   PROTEINUR NEGATIVE 11/17/2019 2054   UROBILINOGEN 0.2 08/07/2013 1155   NITRITE NEGATIVE 11/17/2019 2054   LEUKOCYTESUR NEGATIVE 11/17/2019 2054   LEUKOCYTESUR Negative 08/07/2013 1155    STUDIES: No results found.   ASSESSMENT: 45 y.o. 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   (3) She did not require radiation.   (4) She started tamoxifenin March 2012          (a) stopped tamoxifen January 2018  (b) resumed tamoxifen January 2019  (5) genetic testing on 06/27/2018  (a) Previous BRCA2 VUS was reclassified to favor polymorphism on 05/06/2014.  The remainder of the BRCA1 and BRCA2 genes were negative through [facility].  No BART.   (b)Updated genetic testing 07/05/2018-through the [facility] gene panel offered by [employer] found no deleterious mutations in APC, ATM, BARD1, BMPR1A, BRCA1, BRCA2, BRIP1, CHD1, CDK4, CDKN2A, CHEK2, EPCAM (large rearrangement only), GREM1, HOXB13, AXIN2, MSH3, NTHL1, RNF43, GALNT12, RSP20, MLH1, MSH2, MSH6, MUTYH, NBN, PALB2, PMS2, PTEN, RAD51C, RAD51D, SMAD4, STK11, and TP53. Sequencing was performed for select regions of POLE and POLD1, and large rearrangement analysis was performed for select regions of GREM1.   PLAN: Penny Kim is now just over 9 years out from definitive surgery for her breast cancer with no evidence of disease recurrence.  This is very favorable.  The plan is to continue tamoxifen through March 2023.  She is tolerating that generally well.  She has multiple issues including avascular necrosis of both hips, significant limitations secondary to her psychological/mood problems, and other concerns listed in the review of systems above which unfortunately do severely limit her functional capacity.  She brought a formal disability evaluation form to me.  I explained that I do not do such things as measuring what reach she has or how long she can stand etc.  I do think that in my opinion it would not be possible for her to hold a job that requires any physical strength or persistent mental acuity.  She will see me again in 1 year.  She knows to call for any other  issue that  may develop before then.  Kari Baars   08/20/2020 2:57 PM Medical Oncology and Hematology [facility] [address] [place], [place] [address] Tel. [phone]    Fax. [phone]   I, Mickie Bail, am acting as scribe for Dr. Valentino Hue. Magrinat.  I, Ruthann Cancer MD, have reviewed the above documentation for accuracy and completeness, and I agree with the above.   *Total Encounter Time as defined by the Centers for Medicare and Medicaid Services includes, in addition to the face-to-face time of a patient visit (documented in the note above) non-face-to-face time: obtaining and reviewing outside history, ordering and reviewing medications, tests or procedures, care coordination (communications with other health care professionals or caregivers) and documentation in the medical record.

---

## 1999-01-03 ENCOUNTER — Emergency Department (HOSPITAL_COMMUNITY): Admission: EM | Admit: 1999-01-03 | Discharge: 1999-01-03 | Payer: Self-pay | Admitting: Emergency Medicine

## 2000-11-18 ENCOUNTER — Other Ambulatory Visit: Admission: RE | Admit: 2000-11-18 | Discharge: 2000-11-18 | Payer: Self-pay | Admitting: Family Medicine

## 2002-02-14 ENCOUNTER — Encounter: Admission: RE | Admit: 2002-02-14 | Discharge: 2002-03-09 | Payer: Self-pay | Admitting: Occupational Medicine

## 2004-01-14 ENCOUNTER — Encounter: Admission: RE | Admit: 2004-01-14 | Discharge: 2004-01-14 | Payer: Self-pay | Admitting: Family Medicine

## 2010-02-11 ENCOUNTER — Emergency Department (HOSPITAL_COMMUNITY): Admission: EM | Admit: 2010-02-11 | Discharge: 2010-02-11 | Payer: Self-pay | Admitting: Emergency Medicine

## 2010-02-18 DIAGNOSIS — C50911 Malignant neoplasm of unspecified site of right female breast: Secondary | ICD-10-CM

## 2010-02-18 HISTORY — DX: Malignant neoplasm of unspecified site of right female breast: C50.911

## 2010-04-29 ENCOUNTER — Ambulatory Visit: Payer: Self-pay | Admitting: Genetic Counselor

## 2010-04-30 ENCOUNTER — Ambulatory Visit: Payer: Self-pay | Admitting: Oncology

## 2010-05-03 ENCOUNTER — Encounter: Admission: RE | Admit: 2010-05-03 | Discharge: 2010-05-03 | Payer: Self-pay | Admitting: Radiology

## 2010-05-13 LAB — COMPREHENSIVE METABOLIC PANEL
ALT: 14 U/L (ref 0–35)
Alkaline Phosphatase: 36 U/L — ABNORMAL LOW (ref 39–117)
CO2: 25 mEq/L (ref 19–32)
Sodium: 138 mEq/L (ref 135–145)
Total Bilirubin: 0.6 mg/dL (ref 0.3–1.2)
Total Protein: 7.2 g/dL (ref 6.0–8.3)

## 2010-05-13 LAB — CBC WITH DIFFERENTIAL/PLATELET
BASO%: 1.1 % (ref 0.0–2.0)
LYMPH%: 41.2 % (ref 14.0–49.7)
MCHC: 34.8 g/dL (ref 31.5–36.0)
MONO#: 0.3 10*3/uL (ref 0.1–0.9)
Platelets: 205 10*3/uL (ref 145–400)
RBC: 4.19 10*6/uL (ref 3.70–5.45)
WBC: 4.2 10*3/uL (ref 3.9–10.3)

## 2010-05-18 ENCOUNTER — Ambulatory Visit (HOSPITAL_COMMUNITY): Admission: RE | Admit: 2010-05-18 | Discharge: 2010-05-18 | Payer: Self-pay | Admitting: Oncology

## 2010-06-19 ENCOUNTER — Encounter (INDEPENDENT_AMBULATORY_CARE_PROVIDER_SITE_OTHER): Payer: Self-pay | Admitting: General Surgery

## 2010-06-19 ENCOUNTER — Inpatient Hospital Stay (HOSPITAL_COMMUNITY): Admission: RE | Admit: 2010-06-19 | Discharge: 2010-06-23 | Payer: Self-pay | Admitting: General Surgery

## 2010-06-19 HISTORY — PX: INSERTION OF TISSUE EXPANDER AFTER MASTECTOMY: SHX1831

## 2010-06-19 HISTORY — PX: BREAST SURGERY: SHX581

## 2010-07-01 ENCOUNTER — Ambulatory Visit: Payer: Self-pay | Admitting: Oncology

## 2010-07-13 LAB — CBC WITH DIFFERENTIAL/PLATELET
BASO%: 0.7 % (ref 0.0–2.0)
Basophils Absolute: 0 10*3/uL (ref 0.0–0.1)
EOS%: 6.4 % (ref 0.0–7.0)
Eosinophils Absolute: 0.3 10*3/uL (ref 0.0–0.5)
HCT: 32.9 % — ABNORMAL LOW (ref 34.8–46.6)
HGB: 10.9 g/dL — ABNORMAL LOW (ref 11.6–15.9)
LYMPH%: 28.2 % (ref 14.0–49.7)
MCH: 28.3 pg (ref 25.1–34.0)
MCHC: 33.1 g/dL (ref 31.5–36.0)
MCV: 85.5 fL (ref 79.5–101.0)
MONO#: 0.4 10*3/uL (ref 0.1–0.9)
MONO%: 9.2 % (ref 0.0–14.0)
NEUT#: 2.3 10*3/uL (ref 1.5–6.5)
NEUT%: 55.5 % (ref 38.4–76.8)
Platelets: 344 10*3/uL (ref 145–400)
RBC: 3.85 10*6/uL (ref 3.70–5.45)
RDW: 13.1 % (ref 11.2–14.5)
WBC: 4.2 10*3/uL (ref 3.9–10.3)
lymph#: 1.2 10*3/uL (ref 0.9–3.3)

## 2010-07-17 ENCOUNTER — Ambulatory Visit (HOSPITAL_COMMUNITY): Admission: RE | Admit: 2010-07-17 | Discharge: 2010-07-17 | Payer: Self-pay | Admitting: Oncology

## 2010-07-22 ENCOUNTER — Ambulatory Visit: Admission: RE | Admit: 2010-07-22 | Discharge: 2010-07-22 | Payer: Self-pay | Admitting: Oncology

## 2010-07-22 ENCOUNTER — Encounter: Payer: Self-pay | Admitting: Oncology

## 2010-07-22 ENCOUNTER — Ambulatory Visit: Payer: Self-pay | Admitting: Cardiology

## 2010-07-22 LAB — CBC WITH DIFFERENTIAL/PLATELET
Basophils Absolute: 0 10*3/uL (ref 0.0–0.1)
Eosinophils Absolute: 0.4 10*3/uL (ref 0.0–0.5)
HGB: 11.2 g/dL — ABNORMAL LOW (ref 11.6–15.9)
LYMPH%: 17.7 % (ref 14.0–49.7)
MCV: 86.3 fL (ref 79.5–101.0)
MONO#: 0.2 10*3/uL (ref 0.1–0.9)
MONO%: 4.2 % (ref 0.0–14.0)
NEUT#: 3.1 10*3/uL (ref 1.5–6.5)
Platelets: 274 10*3/uL (ref 145–400)
WBC: 4.5 10*3/uL (ref 3.9–10.3)

## 2010-07-23 ENCOUNTER — Ambulatory Visit (HOSPITAL_BASED_OUTPATIENT_CLINIC_OR_DEPARTMENT_OTHER): Admission: RE | Admit: 2010-07-23 | Discharge: 2010-07-23 | Payer: Self-pay | Admitting: General Surgery

## 2010-07-23 HISTORY — PX: PORTACATH PLACEMENT: SHX2246

## 2010-07-27 LAB — CBC WITH DIFFERENTIAL/PLATELET
Eosinophils Absolute: 0.5 10*3/uL (ref 0.0–0.5)
HCT: 32 % — ABNORMAL LOW (ref 34.8–46.6)
LYMPH%: 15 % (ref 14.0–49.7)
MCHC: 33.4 g/dL (ref 31.5–36.0)
MCV: 82.7 fL (ref 79.5–101.0)
MONO#: 0.7 10*3/uL (ref 0.1–0.9)
MONO%: 9.4 % (ref 0.0–14.0)
NEUT#: 5 10*3/uL (ref 1.5–6.5)
NEUT%: 68.7 % (ref 38.4–76.8)
Platelets: 272 10*3/uL (ref 145–400)
RBC: 3.87 10*6/uL (ref 3.70–5.45)
WBC: 7.3 10*3/uL (ref 3.9–10.3)
nRBC: 0 % (ref 0–0)

## 2010-08-03 ENCOUNTER — Ambulatory Visit: Payer: Self-pay | Admitting: Oncology

## 2010-08-10 LAB — CBC WITH DIFFERENTIAL/PLATELET
BASO%: 0.5 % (ref 0.0–2.0)
Basophils Absolute: 0.1 10*3/uL (ref 0.0–0.1)
EOS%: 0 % (ref 0.0–7.0)
HCT: 30.5 % — ABNORMAL LOW (ref 34.8–46.6)
HGB: 10 g/dL — ABNORMAL LOW (ref 11.6–15.9)
LYMPH%: 9.5 % — ABNORMAL LOW (ref 14.0–49.7)
MCH: 27.2 pg (ref 25.1–34.0)
MCHC: 32.8 g/dL (ref 31.5–36.0)
MCV: 82.9 fL (ref 79.5–101.0)
MONO%: 7.8 % (ref 0.0–14.0)
NEUT%: 82.2 % — ABNORMAL HIGH (ref 38.4–76.8)

## 2010-08-17 LAB — CBC WITH DIFFERENTIAL/PLATELET
Basophils Absolute: 0 10*3/uL (ref 0.0–0.1)
Eosinophils Absolute: 0 10*3/uL (ref 0.0–0.5)
HCT: 28.7 % — ABNORMAL LOW (ref 34.8–46.6)
HGB: 9.4 g/dL — ABNORMAL LOW (ref 11.6–15.9)
LYMPH%: 3.3 % — ABNORMAL LOW (ref 14.0–49.7)
MCH: 26.9 pg (ref 25.1–34.0)
MCV: 82.2 fL (ref 79.5–101.0)
MONO%: 0.1 % (ref 0.0–14.0)
NEUT#: 14.4 10*3/uL — ABNORMAL HIGH (ref 1.5–6.5)
NEUT%: 96.2 % — ABNORMAL HIGH (ref 38.4–76.8)
Platelets: 360 10*3/uL (ref 145–400)
RDW: 13.2 % (ref 11.2–14.5)

## 2010-08-24 LAB — CBC WITH DIFFERENTIAL/PLATELET
Eosinophils Absolute: 0 10*3/uL (ref 0.0–0.5)
HCT: 31 % — ABNORMAL LOW (ref 34.8–46.6)
LYMPH%: 3.3 % — ABNORMAL LOW (ref 14.0–49.7)
MONO#: 3 10*3/uL — ABNORMAL HIGH (ref 0.1–0.9)
NEUT#: 24.3 10*3/uL — ABNORMAL HIGH (ref 1.5–6.5)
NEUT%: 85.2 % — ABNORMAL HIGH (ref 38.4–76.8)
Platelets: 197 10*3/uL (ref 145–400)
WBC: 28.5 10*3/uL — ABNORMAL HIGH (ref 3.9–10.3)
nRBC: 1 % — ABNORMAL HIGH (ref 0–0)

## 2010-08-24 LAB — COMPREHENSIVE METABOLIC PANEL
BUN: 7 mg/dL (ref 6–23)
CO2: 27 mEq/L (ref 19–32)
Creatinine, Ser: 0.6 mg/dL (ref 0.40–1.20)
Glucose, Bld: 88 mg/dL (ref 70–99)
Total Bilirubin: 0.2 mg/dL — ABNORMAL LOW (ref 0.3–1.2)

## 2010-08-31 LAB — CBC WITH DIFFERENTIAL/PLATELET
Basophils Absolute: 0 10*3/uL (ref 0.0–0.1)
EOS%: 0.1 % (ref 0.0–7.0)
Eosinophils Absolute: 0 10*3/uL (ref 0.0–0.5)
HCT: 26 % — ABNORMAL LOW (ref 34.8–46.6)
HGB: 9 g/dL — ABNORMAL LOW (ref 11.6–15.9)
MCH: 28.1 pg (ref 25.1–34.0)
MCV: 81.7 fL (ref 79.5–101.0)
NEUT%: 72.7 % (ref 38.4–76.8)
lymph#: 0.5 10*3/uL — ABNORMAL LOW (ref 0.9–3.3)

## 2010-09-04 ENCOUNTER — Ambulatory Visit: Payer: Self-pay | Admitting: Oncology

## 2010-09-07 LAB — COMPREHENSIVE METABOLIC PANEL
AST: 16 U/L (ref 0–37)
BUN: 12 mg/dL (ref 6–23)
Calcium: 9.6 mg/dL (ref 8.4–10.5)
Chloride: 103 mEq/L (ref 96–112)
Creatinine, Ser: 0.91 mg/dL (ref 0.40–1.20)
Glucose, Bld: 85 mg/dL (ref 70–99)

## 2010-09-07 LAB — CBC WITH DIFFERENTIAL/PLATELET
Basophils Absolute: 0.1 10*3/uL (ref 0.0–0.1)
Eosinophils Absolute: 0 10*3/uL (ref 0.0–0.5)
HCT: 29.8 % — ABNORMAL LOW (ref 34.8–46.6)
HGB: 9.7 g/dL — ABNORMAL LOW (ref 11.6–15.9)
MCH: 26.6 pg (ref 25.1–34.0)
MONO#: 2 10*3/uL — ABNORMAL HIGH (ref 0.1–0.9)
NEUT#: 14.3 10*3/uL — ABNORMAL HIGH (ref 1.5–6.5)
NEUT%: 83.3 % — ABNORMAL HIGH (ref 38.4–76.8)
RDW: 14.2 % (ref 11.2–14.5)
WBC: 17.2 10*3/uL — ABNORMAL HIGH (ref 3.9–10.3)
lymph#: 0.7 10*3/uL — ABNORMAL LOW (ref 0.9–3.3)

## 2010-09-22 LAB — CBC WITH DIFFERENTIAL/PLATELET
BASO%: 0.6 % (ref 0.0–2.0)
EOS%: 0 % (ref 0.0–7.0)
LYMPH%: 8 % — ABNORMAL LOW (ref 14.0–49.7)
MCH: 26.5 pg (ref 25.1–34.0)
MCHC: 32.2 g/dL (ref 31.5–36.0)
MONO#: 0.7 10*3/uL (ref 0.1–0.9)
NEUT%: 85.7 % — ABNORMAL HIGH (ref 38.4–76.8)
Platelets: 260 10*3/uL (ref 145–400)
RBC: 3.47 10*6/uL — ABNORMAL LOW (ref 3.70–5.45)
WBC: 11.8 10*3/uL — ABNORMAL HIGH (ref 3.9–10.3)
lymph#: 1 10*3/uL (ref 0.9–3.3)
nRBC: 1 % — ABNORMAL HIGH (ref 0–0)

## 2010-09-29 LAB — CBC WITH DIFFERENTIAL/PLATELET
BASO%: 1 % (ref 0.0–2.0)
Basophils Absolute: 0.1 10*3/uL (ref 0.0–0.1)
EOS%: 0.2 % (ref 0.0–7.0)
Eosinophils Absolute: 0 10*3/uL (ref 0.0–0.5)
HCT: 27.7 % — ABNORMAL LOW (ref 34.8–46.6)
HGB: 8.9 g/dL — ABNORMAL LOW (ref 11.6–15.9)
LYMPH%: 15.4 % (ref 14.0–49.7)
MCH: 26.5 pg (ref 25.1–34.0)
MCHC: 32.1 g/dL (ref 31.5–36.0)
MCV: 82.4 fL (ref 79.5–101.0)
MONO#: 0.7 10*3/uL (ref 0.1–0.9)
MONO%: 12.3 % (ref 0.0–14.0)
NEUT#: 4.1 10*3/uL (ref 1.5–6.5)
NEUT%: 71.1 % (ref 38.4–76.8)
Platelets: 375 10*3/uL (ref 145–400)
RBC: 3.36 10*6/uL — ABNORMAL LOW (ref 3.70–5.45)
RDW: 17 % — ABNORMAL HIGH (ref 11.2–14.5)
WBC: 5.8 10*3/uL (ref 3.9–10.3)
lymph#: 0.9 10*3/uL (ref 0.9–3.3)

## 2010-10-05 ENCOUNTER — Ambulatory Visit: Payer: Self-pay | Admitting: Oncology

## 2010-10-05 LAB — CBC WITH DIFFERENTIAL/PLATELET
BASO%: 0.9 % (ref 0.0–2.0)
Basophils Absolute: 0 10*3/uL (ref 0.0–0.1)
EOS%: 0.7 % (ref 0.0–7.0)
Eosinophils Absolute: 0 10*3/uL (ref 0.0–0.5)
HCT: 27 % — ABNORMAL LOW (ref 34.8–46.6)
HGB: 8.6 g/dL — ABNORMAL LOW (ref 11.6–15.9)
LYMPH%: 15.2 % (ref 14.0–49.7)
MCH: 26.6 pg (ref 25.1–34.0)
MCHC: 31.9 g/dL (ref 31.5–36.0)
MCV: 83.6 fL (ref 79.5–101.0)
MONO#: 0.4 10*3/uL (ref 0.1–0.9)
MONO%: 8.2 % (ref 0.0–14.0)
NEUT#: 3.2 10*3/uL (ref 1.5–6.5)
NEUT%: 75 % (ref 38.4–76.8)
Platelets: 329 10*3/uL (ref 145–400)
RBC: 3.23 10*6/uL — ABNORMAL LOW (ref 3.70–5.45)
RDW: 18.2 % — ABNORMAL HIGH (ref 11.2–14.5)
WBC: 4.3 10*3/uL (ref 3.9–10.3)
lymph#: 0.7 10*3/uL — ABNORMAL LOW (ref 0.9–3.3)

## 2010-10-12 LAB — HOLD TUBE, BLOOD BANK

## 2010-10-12 LAB — CBC WITH DIFFERENTIAL/PLATELET
EOS%: 3 % (ref 0.0–7.0)
LYMPH%: 17.2 % (ref 14.0–49.7)
MCH: 28.7 pg (ref 25.1–34.0)
MCHC: 33.7 g/dL (ref 31.5–36.0)
MCV: 85.1 fL (ref 79.5–101.0)
MONO%: 7.9 % (ref 0.0–14.0)
Platelets: 286 10*3/uL (ref 145–400)
RBC: 3.28 10*6/uL — ABNORMAL LOW (ref 3.70–5.45)
RDW: 22.5 % — ABNORMAL HIGH (ref 11.2–14.5)

## 2010-10-19 LAB — CBC WITH DIFFERENTIAL/PLATELET
Basophils Absolute: 0 10*3/uL (ref 0.0–0.1)
Eosinophils Absolute: 0.1 10*3/uL (ref 0.0–0.5)
HGB: 8.9 g/dL — ABNORMAL LOW (ref 11.6–15.9)
MCV: 84.4 fL (ref 79.5–101.0)
MONO#: 0.5 10*3/uL (ref 0.1–0.9)
MONO%: 11.2 % (ref 0.0–14.0)
NEUT#: 3 10*3/uL (ref 1.5–6.5)
RBC: 3.21 10*6/uL — ABNORMAL LOW (ref 3.70–5.45)
RDW: 18.9 % — ABNORMAL HIGH (ref 11.2–14.5)
WBC: 4.2 10*3/uL (ref 3.9–10.3)
lymph#: 0.6 10*3/uL — ABNORMAL LOW (ref 0.9–3.3)
nRBC: 0 % (ref 0–0)

## 2010-10-20 ENCOUNTER — Inpatient Hospital Stay (HOSPITAL_COMMUNITY)
Admission: AD | Admit: 2010-10-20 | Discharge: 2010-10-23 | DRG: 443 | Disposition: A | Discharge status: Home / Self Care | Payer: BC Managed Care – PPO | Attending: Plastic Surgery | Admitting: Plastic Surgery

## 2010-10-20 DIAGNOSIS — Z4430 Encounter for fitting and adjustment of external breast prosthesis, unspecified breast: Secondary | ICD-10-CM

## 2010-10-20 DIAGNOSIS — Y838 Other surgical procedures as the cause of abnormal reaction of the patient, or of later complication, without mention of misadventure at the time of the procedure: Secondary | ICD-10-CM | POA: Diagnosis present

## 2010-10-20 DIAGNOSIS — C50919 Malignant neoplasm of unspecified site of unspecified female breast: Secondary | ICD-10-CM | POA: Diagnosis present

## 2010-10-20 DIAGNOSIS — T8579XA Infection and inflammatory reaction due to other internal prosthetic devices, implants and grafts, initial encounter: Principal | ICD-10-CM | POA: Diagnosis present

## 2010-10-20 HISTORY — PX: TISSUE EXPANDER REMOVAL: SHX2531

## 2010-10-20 LAB — CBC
HCT: 23.8 % — ABNORMAL LOW (ref 36.0–46.0)
Hemoglobin: 7.9 g/dL — ABNORMAL LOW (ref 12.0–15.0)
MCV: 84.7 fL (ref 78.0–100.0)
RBC: 2.81 MIL/uL — ABNORMAL LOW (ref 3.87–5.11)
RDW: 18.4 % — ABNORMAL HIGH (ref 11.5–15.5)
WBC: 3.8 10*3/uL — ABNORMAL LOW (ref 4.0–10.5)

## 2010-10-21 LAB — SURGICAL PCR SCREEN: Staphylococcus aureus: NEGATIVE

## 2010-10-21 LAB — CBC
HCT: 27.8 % — ABNORMAL LOW (ref 36.0–46.0)
HCT: 22.9 % — ABNORMAL LOW (ref 36.0–46.0)
Hemoglobin: 9.1 g/dL — ABNORMAL LOW (ref 12.0–15.0)
Hemoglobin: 7.5 g/dL — ABNORMAL LOW (ref 12.0–15.0)
MCH: 27.7 pg (ref 26.0–34.0)
MCH: 27.8 pg (ref 26.0–34.0)
MCHC: 32.7 g/dL (ref 30.0–36.0)
RBC: 3.29 MIL/uL — ABNORMAL LOW (ref 3.87–5.11)
RBC: 2.7 MIL/uL — ABNORMAL LOW (ref 3.87–5.11)

## 2010-10-24 LAB — CULTURE, ROUTINE-ABSCESS

## 2010-10-26 ENCOUNTER — Other Ambulatory Visit: Payer: Self-pay | Admitting: Oncology

## 2010-10-26 ENCOUNTER — Encounter (HOSPITAL_BASED_OUTPATIENT_CLINIC_OR_DEPARTMENT_OTHER): Payer: BC Managed Care – PPO | Admitting: Oncology

## 2010-10-26 DIAGNOSIS — Z5111 Encounter for antineoplastic chemotherapy: Secondary | ICD-10-CM

## 2010-10-26 DIAGNOSIS — R55 Syncope and collapse: Secondary | ICD-10-CM

## 2010-10-26 DIAGNOSIS — C50319 Malignant neoplasm of lower-inner quadrant of unspecified female breast: Secondary | ICD-10-CM

## 2010-10-26 DIAGNOSIS — Z5189 Encounter for other specified aftercare: Secondary | ICD-10-CM

## 2010-10-26 LAB — CBC WITH DIFFERENTIAL/PLATELET
Basophils Absolute: 0 10*3/uL (ref 0.0–0.1)
Eosinophils Absolute: 0 10*3/uL (ref 0.0–0.5)
HGB: 10.1 g/dL — ABNORMAL LOW (ref 11.6–15.9)
LYMPH%: 4.5 % — ABNORMAL LOW (ref 14.0–49.7)
MCH: 28.5 pg (ref 25.1–34.0)
MCV: 86.3 fL (ref 79.5–101.0)
MONO%: 0.4 % (ref 0.0–14.0)
NEUT#: 8.7 10*3/uL — ABNORMAL HIGH (ref 1.5–6.5)
NEUT%: 94.6 % — ABNORMAL HIGH (ref 38.4–76.8)
Platelets: 325 10*3/uL (ref 145–400)

## 2010-10-26 LAB — ANAEROBIC CULTURE

## 2010-11-02 ENCOUNTER — Other Ambulatory Visit: Payer: Self-pay | Admitting: Oncology

## 2010-11-02 ENCOUNTER — Encounter (HOSPITAL_BASED_OUTPATIENT_CLINIC_OR_DEPARTMENT_OTHER): Payer: BC Managed Care – PPO | Admitting: Oncology

## 2010-11-02 DIAGNOSIS — C50319 Malignant neoplasm of lower-inner quadrant of unspecified female breast: Secondary | ICD-10-CM

## 2010-11-02 LAB — CBC WITH DIFFERENTIAL/PLATELET
BASO%: 0.3 % (ref 0.0–2.0)
EOS%: 1.2 % (ref 0.0–7.0)
Eosinophils Absolute: 0.1 10*3/uL (ref 0.0–0.5)
LYMPH%: 13.9 % — ABNORMAL LOW (ref 14.0–49.7)
MCH: 28.2 pg (ref 25.1–34.0)
MCHC: 33.4 g/dL (ref 31.5–36.0)
MCV: 84.5 fL (ref 79.5–101.0)
MONO%: 7.5 % (ref 0.0–14.0)
Platelets: 338 10*3/uL (ref 145–400)
RBC: 3.39 10*6/uL — ABNORMAL LOW (ref 3.70–5.45)

## 2010-11-18 NOTE — Op Note (Signed)
NAMENAUTIA, LEM NO.:  [id number]  MEDICAL RECORD NO.:  [id number]          PATIENT TYPE:  INP  LOCATION:  5155                         FACILITY:  [facility]  PHYSICIAN:  Loreta Ave, MD DATE OF BIRTH:  October 05, 1970  DATE OF PROCEDURE:  10/21/2010 DATE OF DISCHARGE:                              OPERATIVE REPORT   PREOPERATIVE DIAGNOSIS:  Infected left breast tissue expander.  POSTOPERATIVE DIAGNOSIS:  Infected left breast tissue expander.  PROCEDURE PERFORMED:  Removal of infected left breast tissue expander.  SURGEON:  Loreta Ave, MD  ASSISTANT:  Tonye Becket, nurse practitioner.  ANESTHESIA:  General.  ESTIMATED BLOOD LOSS:  Minimal.  SPECIMENS:  Cultures of periimplant fluid were sent to microbiology.  COMPLICATIONS:  None.  CLINICAL INDICATION:  Penny Kim is a 40 year old female who is status post bilateral breast reconstruction after bilateral mastectomies with placement of bilateral tissue expanders.  Approximately 6 weeks ago, she finished chemotherapy and has subsequently begun bilateral breast tissue expansion.  She presented to my office yesterday complaining of a 1-week episode of increasing left breast pain, erythema, edema as well as flu- like symptoms.  I admitted her to the hospital for IV antibiotics and observation.  Her left breast has improved somewhat with regard to erythema since her admission in the initiation of antibiotic therapy. However, she has persistent pain and significant edema with small amounts of erythema persisting.  I recommended to the patient that she have her left breast tissue expander removed.  She understands the risks of surgery to include but not be limiting to bleeding, ongoing infection, damage to nearby structures, cosmetic deformity as well as the need for more surgery and the additional need of multiple procedures for left breast reconstruction.  She desires to proceed.  DESCRIPTION OF  OPERATION:  The patient was brought to the operating room, placed in the supine position on the operating room table.  After smooth and routine induction of general anesthesia, the left chest was prepped with chlorhexidine and draped into a sterile field.  A 10 mL of 0.5%Marcaine with epinephrine were injected about the lateral mastectomy scar.  The lateral portion of the mastectomy scar was excised with a 10 blade and the periimplant fluid was immediately encountered within the breast capsule.  This was cultured and sent to microbiology for aerobic and anaerobic cultures as well as Gram stain.  The fluid appeared to be turbid and was serum mixed with old blood.  The left breast tissue expander was removed manually from the left mastectomy defect.  The wound was irrigated with copious amounts of normal saline as well as bacitracin containing normal saline.  Two 19-French round Blake drains were placed via separate stab incisions and were secured to the skin with silk sutures.  The skin was closed with the interrupted buried 3-0 PDS sutures in interrupted simple 3-0 nylon sutures; 4x4s as well as circumferential Ace wraps then applied.  The patient tolerated the procedure well.  She was extubated and transferred to the recovery room in stable condition.     Loreta Ave, MD     CF/MEDQ  D:  10/21/2010  T:  10/22/2010  Job:  [id number]  Electronically Signed by Loreta Ave MD on 11/18/2010 07:58:45 PM

## 2010-11-18 NOTE — Discharge Summary (Signed)
NAMEANDORA, KRULL NO.:  [id number]  MEDICAL RECORD NO.:  [id number]           PATIENT TYPE:  I  LOCATION:  5155                         FACILITY:  [facility]  PHYSICIAN:  Loreta Ave, MD DATE OF BIRTH:  Nov 15, 1970  DATE OF ADMISSION:  10/20/2010 DATE OF DISCHARGE:  10/23/2010                              DISCHARGE SUMMARY   PRIMARY ADMITTING DIAGNOSIS:  Infected left breast tissue expander.  ADDITIONAL DISCHARGE DIAGNOSES: 1. Infected left breast tissue expander. 2. Right breast cancer.  PROCEDURES PERFORMED:  On October 21, 2010, removal of infected left breast tissue expander.  HISTORY:  Penny Kim is a 40 year old African American female who is status post bilateral breast reconstruction after bilateral mastectomy with placement of bilateral tissue expanders on June 19, 2010.  She has recently completed chemotherapy in approximately 6 weeks ago and subsequently had bilateral breast tissue expansion.  She presented to Dr. Kallie Edward office on October 20, 2010 after complaining of 1 week of flu-like symptoms with increasing left breast pain, edema and erythema to the left breast.  She was therefore admitted to the hospital for IV antibiotics.  HOSPITAL COURSE:  Penny Kim was admitted on October 20, 2010, for left breast tissue expander infection for IV antibiotics and observation. She was taken to the operating room on October 21, 2010, for removal of left breast tissue expander by Dr. Noelle Penner.  At the completion of the operation, she was discharged to her room in stable condition and return to the Med-Surg Unit.  Her white blood count on October 21, 2010, was 5.4, hemoglobin 9.1, hematocrit 27.8, platelets 320.  She remained afebrile throughout her hospitalization and on postoperative day #1, she was converted to p.r.n. antibiotics.  At that time, her white blood count was 7.8, hemoglobin was 7.5, hematocrit was 22.9, and her platelets were  295.  Her pain was adequately managed with p.o. narcotics.  She was discharged from the hospital on October 23, 2010, remain afebrile with decreased erythema noted to the left breast.  Her incision remained intact with no JP drainage #1 was 45 mL for 24 hours, JP #2 was 45 mL for 24 hours and both remained serosanguineous.  At the time of discharge, she is ambulating independently, voiding spontaneously and tolerating regular diet and her pain is adequately controlled with p.o. narcotics.  DISCHARGE MEDICATIONS: 1. Keflex 500 mg by mouth every 6 hours. 2. Colace 100 mg by mouth twice daily. 3. Dexamethasone 4 mg by mouth twice daily. 4. Ferrous sulfate 65 mg by mouth daily. 5. Hydrocodone one to two tablets by mouth every 4 hours as needed for     pain. 6. Klonopin 1 mg by mouth twice daily as needed for anxiety. 7. Ativan 0.5 mg one to two tablets by mouth every 6 hours as needed     for sleep. 8. Multivitamin tablets daily by mouth. 9. Prochlorperazine 10 mg one tablet by mouth before meals and at     bedtime as needed. 10.Sertraline 100 mg by mouth daily.  DISCHARGE INSTRUCTIONS:  She is to refrain from driving while taking p.o. narcotics.  She  may continue a regular diet.  In p.m., record JP drains daily.  She is asked to follow up with Dr. Noelle Penner on October 29, 2010 by making an appointment at [phone].  Ms. Humphries is asked to contact Dr. Noelle Penner if she experiences any nausea, vomiting, or pain not controlled by her pain medication.  Ms. Tenpas has met maximum benefit of inpatient hospitalization and is deemed appropriate for discharge.     Tonye Becket, NP   ______________________________ Loreta Ave, MD    AC/MEDQ  D:  10/23/2010  T:  10/24/2010  Job:  [id number]  Electronically Signed by Loreta Ave MD on 11/18/2010 07:58:39 PM

## 2010-11-25 ENCOUNTER — Other Ambulatory Visit (HOSPITAL_COMMUNITY): Payer: Self-pay | Admitting: General Surgery

## 2010-11-25 ENCOUNTER — Encounter (HOSPITAL_COMMUNITY)
Admission: RE | Admit: 2010-11-25 | Discharge: 2010-11-25 | Disposition: A | Discharge status: Home / Self Care | Payer: BC Managed Care – PPO | Source: Ambulatory Visit | Attending: General Surgery | Admitting: General Surgery

## 2010-11-25 ENCOUNTER — Ambulatory Visit (HOSPITAL_COMMUNITY)
Admission: RE | Admit: 2010-11-25 | Discharge: 2010-11-25 | Disposition: A | Discharge status: Home / Self Care | Payer: BC Managed Care – PPO | Source: Ambulatory Visit | Attending: General Surgery | Admitting: General Surgery

## 2010-11-25 DIAGNOSIS — Z01818 Encounter for other preprocedural examination: Secondary | ICD-10-CM | POA: Insufficient documentation

## 2010-11-25 DIAGNOSIS — C50919 Malignant neoplasm of unspecified site of unspecified female breast: Secondary | ICD-10-CM

## 2010-11-25 DIAGNOSIS — Z01812 Encounter for preprocedural laboratory examination: Secondary | ICD-10-CM | POA: Insufficient documentation

## 2010-11-25 DIAGNOSIS — Z01811 Encounter for preprocedural respiratory examination: Secondary | ICD-10-CM | POA: Insufficient documentation

## 2010-11-25 LAB — DIFFERENTIAL
Basophils Absolute: 0 10*3/uL (ref 0.0–0.1)
Lymphocytes Relative: 28 % (ref 12–46)
Lymphs Abs: 0.8 10*3/uL (ref 0.7–4.0)
Monocytes Absolute: 0.2 10*3/uL (ref 0.1–1.0)
Neutro Abs: 1.8 10*3/uL (ref 1.7–7.7)

## 2010-11-25 LAB — BASIC METABOLIC PANEL
CO2: 27 mEq/L (ref 19–32)
Calcium: 9.5 mg/dL (ref 8.4–10.5)
Glucose, Bld: 96 mg/dL (ref 70–99)
Potassium: 3.7 mEq/L (ref 3.5–5.1)
Sodium: 139 mEq/L (ref 135–145)

## 2010-11-25 LAB — CBC
HCT: 34.2 % — ABNORMAL LOW (ref 36.0–46.0)
Hemoglobin: 11.3 g/dL — ABNORMAL LOW (ref 12.0–15.0)
MCHC: 33 g/dL (ref 30.0–36.0)
MCV: 85.5 fL (ref 78.0–100.0)

## 2010-11-25 LAB — SURGICAL PCR SCREEN: Staphylococcus aureus: NEGATIVE

## 2010-11-25 LAB — APTT: aPTT: 29 seconds (ref 24–37)

## 2010-11-26 ENCOUNTER — Ambulatory Visit (HOSPITAL_COMMUNITY)
Admission: RE | Admit: 2010-11-26 | Discharge: 2010-11-26 | Disposition: A | Discharge status: Home / Self Care | Payer: BC Managed Care – PPO | Source: Ambulatory Visit | Attending: General Surgery | Admitting: General Surgery

## 2010-11-26 DIAGNOSIS — T451X5A Adverse effect of antineoplastic and immunosuppressive drugs, initial encounter: Secondary | ICD-10-CM | POA: Insufficient documentation

## 2010-11-26 DIAGNOSIS — Z452 Encounter for adjustment and management of vascular access device: Secondary | ICD-10-CM | POA: Insufficient documentation

## 2010-11-26 DIAGNOSIS — Y9210 Unspecified residential institution as the place of occurrence of the external cause: Secondary | ICD-10-CM | POA: Insufficient documentation

## 2010-11-26 DIAGNOSIS — C50919 Malignant neoplasm of unspecified site of unspecified female breast: Secondary | ICD-10-CM | POA: Insufficient documentation

## 2010-11-26 HISTORY — PX: PORT-A-CATH REMOVAL: SHX5289

## 2010-12-02 LAB — GLUCOSE, CAPILLARY: Glucose-Capillary: 90 mg/dL (ref 70–99)

## 2010-12-03 LAB — CBC
Hemoglobin: 13.2 g/dL (ref 12.0–15.0)
MCH: 29.2 pg (ref 26.0–34.0)
MCHC: 34.6 g/dL (ref 30.0–36.0)
MCHC: 34.7 g/dL (ref 30.0–36.0)
Platelets: 174 10*3/uL (ref 150–400)
Platelets: 204 10*3/uL (ref 150–400)
RDW: 12.9 % (ref 11.5–15.5)
RDW: 12.9 % (ref 11.5–15.5)
WBC: 8.3 10*3/uL (ref 4.0–10.5)

## 2010-12-03 LAB — COMPREHENSIVE METABOLIC PANEL
ALT: 14 U/L (ref 0–35)
AST: 20 U/L (ref 0–37)
Albumin: 3.9 g/dL (ref 3.5–5.2)
CO2: 28 mEq/L (ref 19–32)
Calcium: 9.6 mg/dL (ref 8.4–10.5)
GFR calc Af Amer: >60 mL/min (ref >60)
Sodium: 138 mEq/L (ref 135–145)
Total Protein: 7.2 g/dL (ref 6.0–8.3)

## 2010-12-03 LAB — GLUCOSE, CAPILLARY
Glucose-Capillary: 102 mg/dL — ABNORMAL HIGH (ref 70–99)
Glucose-Capillary: 103 mg/dL — ABNORMAL HIGH (ref 70–99)
Glucose-Capillary: 109 mg/dL — ABNORMAL HIGH (ref 70–99)
Glucose-Capillary: 116 mg/dL — ABNORMAL HIGH (ref 70–99)
Glucose-Capillary: 145 mg/dL — ABNORMAL HIGH (ref 70–99)
Glucose-Capillary: 87 mg/dL (ref 70–99)
Glucose-Capillary: 137 mg/dL — ABNORMAL HIGH (ref 70–99)
Glucose-Capillary: 151 mg/dL — ABNORMAL HIGH (ref 70–99)

## 2010-12-03 LAB — DIFFERENTIAL
Eosinophils Absolute: 0 10*3/uL (ref 0.0–0.7)
Eosinophils Relative: 1 % (ref 0–5)
Lymphs Abs: 1.5 10*3/uL (ref 0.7–4.0)
Monocytes Absolute: 0.4 10*3/uL (ref 0.1–1.0)
Monocytes Relative: 11 % (ref 3–12)

## 2010-12-03 LAB — HEMOGLOBIN AND HEMATOCRIT, BLOOD: Hemoglobin: 8.8 g/dL — ABNORMAL LOW (ref 12.0–15.0)

## 2010-12-03 LAB — CANCER ANTIGEN 27.29: CA 27.29: 16 U/mL (ref 0–39)

## 2010-12-03 LAB — BASIC METABOLIC PANEL
BUN: 4 mg/dL — ABNORMAL LOW (ref 6–23)
Creatinine, Ser: 0.85 mg/dL (ref 0.4–1.2)
GFR calc non Af Amer: >60 mL/min (ref >60)

## 2010-12-03 NOTE — Op Note (Signed)
  Penny Kim, Penny Kim NO.:  [id number]  MEDICAL RECORD NO.:  [id number]           PATIENT TYPE:  O  LOCATION:  [facility]                         FACILITY:  [facility]  PHYSICIAN:  Juanetta Gosling, MDDATE OF BIRTH:  December 08, 1970  DATE OF PROCEDURE:  11/26/2010 DATE OF DISCHARGE:  11/26/2010                              OPERATIVE REPORT   PREOPERATIVE DIAGNOSIS:  Port, status post chemotherapy for breast cancer.  POSTOPERATIVE DIAGNOSIS:  Port, status post chemotherapy for breast cancer.  PROCEDURE:  Left subclavian port removal.  SURGEON:  Juanetta Gosling, MD  ASSISTANT:  None.  ANESTHESIA:  Local with MAC.  SPECIMENS:  None.  DRAINS:  None.  COMPLICATIONS:  None.  ESTIMATED BLOOD LOSS:  Minimal.  DISPOSITION:  To recovery room in stable condition.  INDICATIONS:  [name] is a 40 year old female, breast cancer patient. Her chemotherapy was stopped early due to some toxicity.  She was then referred back for port removal.  PROCEDURE:  After informed consent was obtained, the patient was taken to the operating room.  She was placed under monitored anesthesia care. Her chest was prepped and draped in standard sterile surgical fashion. A surgical time-out was then performed.  I infiltrated with 0.25% Marcaine throughout her old incision and reentered her old incision.  I removed the port in its entirety without difficulty as well as all the Prolene suture.  The cavity was hemostatic.  I closed this with 3-0 Vicryl and 4-0 Monocryl.  Dermabond was placed over this.  She tolerated this well and was transferred to the recovery room.     Juanetta Gosling, MD     MCW/MEDQ  D:  11/26/2010  T:  11/27/2010  Job:  [id number]  cc:   Valentino Hue. Magrinat, M.D.  Electronically Signed by Emelia Loron MD on 12/03/2010 09:25:48 AM

## 2010-12-28 ENCOUNTER — Other Ambulatory Visit: Payer: Self-pay | Admitting: Oncology

## 2010-12-28 ENCOUNTER — Encounter (HOSPITAL_BASED_OUTPATIENT_CLINIC_OR_DEPARTMENT_OTHER): Payer: BC Managed Care – PPO | Admitting: Oncology

## 2010-12-28 DIAGNOSIS — C50319 Malignant neoplasm of lower-inner quadrant of unspecified female breast: Secondary | ICD-10-CM

## 2010-12-28 LAB — COMPREHENSIVE METABOLIC PANEL
ALT: 9 U/L (ref 0–35)
AST: 18 U/L (ref 0–37)
CO2: 27 mEq/L (ref 19–32)
Creatinine, Ser: 0.85 mg/dL (ref 0.40–1.20)
Sodium: 142 mEq/L (ref 135–145)
Total Bilirubin: 0.2 mg/dL — ABNORMAL LOW (ref 0.3–1.2)
Total Protein: 6.9 g/dL (ref 6.0–8.3)

## 2010-12-28 LAB — CBC WITH DIFFERENTIAL/PLATELET
BASO%: 0.5 % (ref 0.0–2.0)
EOS%: 6.2 % (ref 0.0–7.0)
LYMPH%: 33.2 % (ref 14.0–49.7)
MCH: 29.3 pg (ref 25.1–34.0)
MCHC: 34 g/dL (ref 31.5–36.0)
MONO#: 0.3 10*3/uL (ref 0.1–0.9)
NEUT%: 49.3 % (ref 38.4–76.8)
Platelets: 194 10*3/uL (ref 145–400)
RBC: 4.33 10*6/uL (ref 3.70–5.45)
WBC: 2.4 10*3/uL — ABNORMAL LOW (ref 3.9–10.3)
lymph#: 0.8 10*3/uL — ABNORMAL LOW (ref 0.9–3.3)

## 2010-12-28 LAB — CANCER ANTIGEN 27.29: CA 27.29: 18 U/mL (ref 0–39)

## 2011-01-04 ENCOUNTER — Encounter (HOSPITAL_BASED_OUTPATIENT_CLINIC_OR_DEPARTMENT_OTHER): Payer: BC Managed Care – PPO | Admitting: Oncology

## 2011-01-04 DIAGNOSIS — C50319 Malignant neoplasm of lower-inner quadrant of unspecified female breast: Secondary | ICD-10-CM

## 2011-02-03 ENCOUNTER — Other Ambulatory Visit: Payer: Self-pay | Admitting: Oncology

## 2011-02-03 ENCOUNTER — Encounter (HOSPITAL_BASED_OUTPATIENT_CLINIC_OR_DEPARTMENT_OTHER): Payer: BC Managed Care – PPO | Admitting: Oncology

## 2011-02-03 DIAGNOSIS — C50319 Malignant neoplasm of lower-inner quadrant of unspecified female breast: Secondary | ICD-10-CM

## 2011-02-03 LAB — CBC WITH DIFFERENTIAL/PLATELET
BASO%: 0.6 % (ref 0.0–2.0)
Basophils Absolute: 0 10*3/uL (ref 0.0–0.1)
EOS%: 4.8 % (ref 0.0–7.0)
HCT: 36.3 % (ref 34.8–46.6)
HGB: 12.4 g/dL (ref 11.6–15.9)
LYMPH%: 28 % (ref 14.0–49.7)
MCH: 28.9 pg (ref 25.1–34.0)
MCHC: 34.2 g/dL (ref 31.5–36.0)
MCV: 84.3 fL (ref 79.5–101.0)
MONO%: 9.7 % (ref 0.0–14.0)
NEUT%: 56.9 % (ref 38.4–76.8)
Platelets: 200 10*3/uL (ref 145–400)

## 2011-02-04 ENCOUNTER — Ambulatory Visit: Payer: BC Managed Care – PPO | Admitting: Psychiatry

## 2011-02-04 ENCOUNTER — Ambulatory Visit (HOSPITAL_COMMUNITY)
Admission: RE | Admit: 2011-02-04 | Discharge: 2011-02-04 | Disposition: A | Discharge status: Home / Self Care | Payer: BC Managed Care – PPO | Source: Ambulatory Visit | Attending: Oncology | Admitting: Oncology

## 2011-02-04 ENCOUNTER — Other Ambulatory Visit: Payer: Self-pay | Admitting: Oncology

## 2011-02-04 DIAGNOSIS — F17200 Nicotine dependence, unspecified, uncomplicated: Secondary | ICD-10-CM | POA: Insufficient documentation

## 2011-02-04 DIAGNOSIS — Z01818 Encounter for other preprocedural examination: Secondary | ICD-10-CM | POA: Insufficient documentation

## 2011-02-04 DIAGNOSIS — C50919 Malignant neoplasm of unspecified site of unspecified female breast: Secondary | ICD-10-CM

## 2011-02-04 DIAGNOSIS — R079 Chest pain, unspecified: Secondary | ICD-10-CM | POA: Insufficient documentation

## 2011-03-19 ENCOUNTER — Emergency Department (HOSPITAL_COMMUNITY): Payer: BC Managed Care – PPO

## 2011-03-19 ENCOUNTER — Inpatient Hospital Stay (HOSPITAL_COMMUNITY)
Admission: EM | Admit: 2011-03-19 | Discharge: 2011-03-22 | DRG: 582 | Disposition: A | Discharge status: Other Acute Inpatient Hospital | Payer: BC Managed Care – PPO | Attending: Internal Medicine | Admitting: Internal Medicine

## 2011-03-19 DIAGNOSIS — Z853 Personal history of malignant neoplasm of breast: Secondary | ICD-10-CM

## 2011-03-19 DIAGNOSIS — T398X2A Poisoning by other nonopioid analgesics and antipyretics, not elsewhere classified, intentional self-harm, initial encounter: Secondary | ICD-10-CM | POA: Diagnosis present

## 2011-03-19 DIAGNOSIS — T394X2A Poisoning by antirheumatics, not elsewhere classified, intentional self-harm, initial encounter: Secondary | ICD-10-CM | POA: Diagnosis present

## 2011-03-19 DIAGNOSIS — K219 Gastro-esophageal reflux disease without esophagitis: Secondary | ICD-10-CM | POA: Diagnosis present

## 2011-03-19 DIAGNOSIS — F332 Major depressive disorder, recurrent severe without psychotic features: Secondary | ICD-10-CM | POA: Diagnosis present

## 2011-03-19 DIAGNOSIS — D649 Anemia, unspecified: Secondary | ICD-10-CM | POA: Diagnosis present

## 2011-03-19 DIAGNOSIS — G92 Toxic encephalopathy: Secondary | ICD-10-CM | POA: Diagnosis present

## 2011-03-19 DIAGNOSIS — Y92009 Unspecified place in unspecified non-institutional (private) residence as the place of occurrence of the external cause: Secondary | ICD-10-CM

## 2011-03-19 DIAGNOSIS — T450X4A Poisoning by antiallergic and antiemetic drugs, undetermined, initial encounter: Secondary | ICD-10-CM | POA: Diagnosis present

## 2011-03-19 DIAGNOSIS — G929 Unspecified toxic encephalopathy: Secondary | ICD-10-CM | POA: Diagnosis present

## 2011-03-19 DIAGNOSIS — T391X1A Poisoning by 4-Aminophenol derivatives, accidental (unintentional), initial encounter: Principal | ICD-10-CM | POA: Diagnosis present

## 2011-03-19 DIAGNOSIS — T50992A Poisoning by other drugs, medicaments and biological substances, intentional self-harm, initial encounter: Secondary | ICD-10-CM | POA: Diagnosis present

## 2011-03-19 DIAGNOSIS — E876 Hypokalemia: Secondary | ICD-10-CM | POA: Diagnosis present

## 2011-03-19 LAB — BLOOD GAS, ARTERIAL
Drawn by: 307971
O2 Content: 2 L/min
O2 Saturation: 99.1 %
Patient temperature: 98.6
pH, Arterial: 7.354 (ref 7.350–7.400)

## 2011-03-19 LAB — URINALYSIS, ROUTINE W REFLEX MICROSCOPIC
Bilirubin Urine: NEGATIVE
Glucose, UA: NEGATIVE mg/dL
Hgb urine dipstick: NEGATIVE
Protein, ur: NEGATIVE mg/dL
Urobilinogen, UA: 0.2 mg/dL (ref 0.0–1.0)

## 2011-03-19 LAB — CBC
Hemoglobin: 14 g/dL (ref 12.0–15.0)
MCHC: 33.6 g/dL (ref 30.0–36.0)
RDW: 13.8 % (ref 11.5–15.5)
WBC: 3.7 10*3/uL — ABNORMAL LOW (ref 4.0–10.5)

## 2011-03-19 LAB — DIFFERENTIAL
Basophils Absolute: 0 10*3/uL (ref 0.0–0.1)
Basophils Relative: 1 % (ref 0–1)
Eosinophils Relative: 4 % (ref 0–5)
Monocytes Absolute: 0.3 10*3/uL (ref 0.1–1.0)
Neutro Abs: 1.7 10*3/uL (ref 1.7–7.7)

## 2011-03-19 LAB — RAPID URINE DRUG SCREEN, HOSP PERFORMED
Amphetamines: NOT DETECTED
Barbiturates: NOT DETECTED
Opiates: NOT DETECTED
Tetrahydrocannabinol: POSITIVE — AB

## 2011-03-19 LAB — COMPREHENSIVE METABOLIC PANEL
ALT: 15 U/L (ref 0–35)
AST: 24 U/L (ref 0–37)
Alkaline Phosphatase: 54 U/L (ref 39–117)
Calcium: 9.5 mg/dL (ref 8.4–10.5)
Potassium: 4.1 mEq/L (ref 3.5–5.1)
Sodium: 138 mEq/L (ref 135–145)
Total Protein: 8 g/dL (ref 6.0–8.3)

## 2011-03-19 LAB — ACETAMINOPHEN LEVEL
Acetaminophen (Tylenol), Serum: 113.1 ug/mL — ABNORMAL HIGH (ref 10–30)
Acetaminophen (Tylenol), Serum: 180.1 ug/mL (ref 10–30)

## 2011-03-19 LAB — PROTIME-INR: Prothrombin Time: 13.1 seconds (ref 11.6–15.2)

## 2011-03-20 DIAGNOSIS — F332 Major depressive disorder, recurrent severe without psychotic features: Secondary | ICD-10-CM

## 2011-03-20 DIAGNOSIS — F411 Generalized anxiety disorder: Secondary | ICD-10-CM

## 2011-03-20 LAB — HEPATIC FUNCTION PANEL
ALT: 14 U/L (ref 0–35)
Indirect Bilirubin: 0.1 mg/dL — ABNORMAL LOW (ref 0.3–0.9)
Total Protein: 6.7 g/dL (ref 6.0–8.3)

## 2011-03-20 LAB — COMPREHENSIVE METABOLIC PANEL
ALT: 10 U/L (ref 0–35)
AST: 18 U/L (ref 0–37)
Alkaline Phosphatase: 41 U/L (ref 39–117)
CO2: 23 mEq/L (ref 19–32)
Chloride: 110 mEq/L (ref 96–112)
GFR calc non Af Amer: >60 mL/min (ref >60)
Potassium: 3.9 mEq/L (ref 3.5–5.1)
Sodium: 142 mEq/L (ref 135–145)
Total Bilirubin: 0.2 mg/dL — ABNORMAL LOW (ref 0.3–1.2)

## 2011-03-20 LAB — ACETAMINOPHEN LEVEL: Acetaminophen (Tylenol), Serum: <15 ug/mL (ref 10–30)

## 2011-03-20 LAB — GLUCOSE, CAPILLARY
Glucose-Capillary: 128 mg/dL — ABNORMAL HIGH (ref 70–99)
Glucose-Capillary: 109 mg/dL — ABNORMAL HIGH (ref 70–99)

## 2011-03-20 LAB — PROTIME-INR: INR: 1.19 (ref 0.00–1.49)

## 2011-03-21 LAB — MAGNESIUM: Magnesium: 1.5 mg/dL (ref 1.5–2.5)

## 2011-03-21 LAB — COMPREHENSIVE METABOLIC PANEL
Alkaline Phosphatase: 31 U/L — ABNORMAL LOW (ref 39–117)
BUN: 4 mg/dL — ABNORMAL LOW (ref 6–23)
Chloride: 108 mEq/L (ref 96–112)
GFR calc Af Amer: >60 mL/min (ref >60)
GFR calc non Af Amer: >60 mL/min (ref >60)
Glucose, Bld: 108 mg/dL — ABNORMAL HIGH (ref 70–99)
Potassium: 3.2 mEq/L — ABNORMAL LOW (ref 3.5–5.1)
Total Bilirubin: 0.3 mg/dL (ref 0.3–1.2)

## 2011-03-21 LAB — CBC
HCT: 33.5 % — ABNORMAL LOW (ref 36.0–46.0)
Hemoglobin: 11.6 g/dL — ABNORMAL LOW (ref 12.0–15.0)
MCHC: 34.6 g/dL (ref 30.0–36.0)
WBC: 4.2 10*3/uL (ref 4.0–10.5)

## 2011-03-21 LAB — GLUCOSE, CAPILLARY
Glucose-Capillary: 112 mg/dL — ABNORMAL HIGH (ref 70–99)
Glucose-Capillary: 109 mg/dL — ABNORMAL HIGH (ref 70–99)
Glucose-Capillary: 98 mg/dL (ref 70–99)

## 2011-03-21 NOTE — Consult Note (Signed)
NAMEHITOMI, SLAPE NO.:  [id number]  MEDICAL RECORD NO.:  [id number]  LOCATION:  1227                         FACILITY:  [facility]  PHYSICIAN:  Franchot Gallo, MD     DATE OF BIRTH:  June 11, 1971  DATE OF CONSULTATION:  03/20/2011 DATE OF DISCHARGE:                                CONSULTATION   CHIEF COMPLAINT:  "I tried to kill myself."  HISTORY OF PRESENT ILLNESS:  Ms. Kocher is a 40 year old single black female, who was admitted to [facility] for evaluation of overdose of Tylenol as well as 140 ounces of beer.  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  The patient states that she became distraught and went home and consumed 50 Tylenol PM as well as 140 ounces of beer.  As stated, the patient reports a long history of depressive symptoms and is currently being followed by Dr. Evelene Croon in [place], [place] for depression.  She states that prior to her overdose, her depression was under fair control, but now states that her depression is severe.  On a scale of 1 to 10, she rates her depression as a 10, reporting severe feelings of sadness, anhedonia, and depressed mood.  She denies any current suicidal or homicidal ideation, she states this is the first suicide attempt she has ever made.  She is adamant that this was a "stupid thing for her to do" and feels safe currently in the hospital.  The patient reports severe anxiety symptoms, which she has also had for quite some time.  She states that these have been controlled on Klonopin 1 mg b.i.d. p.r.n., also has prescribed by Dr. Evelene Croon.  The patient denies any past or current auditory or visual hallucinations or delusional thinking.  She also denies any past or current manic or hypomanic symptoms.  The patient reports to smoking  marijuana secondary to issues related to her breast cancer.  She reports that the marijuana helps with her appetite and with her anxiety.  She denies any abuse of alcohol, tobacco, or illicit drugs.  The patient is being evaluated for her depression and suicide attempt.  PAST PSYCHIATRIC HISTORY:  The patient reports to suffer from depression "all of my life."  As stated above, she states that she is being followed by Dr. Evelene Croon in [place], [place] for her depressive symptoms.  PAST MEDICAL HISTORY:  Current medications - 1. Zoloft 200 mg p.o. q.a.m. 2. Klonopin 1 mg p.o. b.i.d. - p.r.n. for anxiety. 3. Tamoxifen 20 mg p.o. q.a.m.Marland Kitchen 4. Zantac 150 mg tablets, 1 to 2 tablets p.o. daily - p.r.n. for acid     reflux. 5. Tylenol PM - for sleep. 6. Multivitamin powder in 1 teaspoon of Orange juice daily.  ALLERGIES:  TAPE ADHESIVE -  result in rash.  MEDICAL ILLNESSES: 1. History of breast cancer with bilateral mastectomy in September     2011. 2. GE reflux disease.  PAST OPERATIONS:  Bilateral mastectomy with reconstruction on June 18, 2010.  FAMILY HISTORY:  The patient denies any family history of psychiatric or substance-related illnesses.  SOCIAL HISTORY:  The patient reports that she was born and raised in [place], [place] and currently lives alone.  She is single and has never been married and has no children.  She does report that her mother, aunt, grandparents, and other family members are in the area and are very supportive.  The patient reports to completing high school as well as Masters in business administration.  She was recently employed as a Network engineer at [employer], but as stated in the HPI, lost her job 1 day prior to her suicide attempt.  The patient denies any use of tobacco products or alcohol.  She does state that she uses marijuana on a regular basis to help with her anxiety and appetite issues, concerning her breast  cancer.  MENTAL STATUS EXAMINATION:  GENERAL:  The patient was alert and oriented x3 and was friendly and cooperative with this Environmental consultant.  Speech was appropriate in rate and volume.  Mood appeared severely depressed. Affect was essentially flat.  THOUGHT - patient denied any obvious delusions or hallucinations.  She also denied any current suicidal or homicidal ideation stating that this was "a stupid thing to do." Judgment and insight today both appeared fair to good.  IMPRESSION:  Axis I: 1. 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 2. Generalized anxiety disorder - currently under poor control. Axis II:  Deferred. Axis III:  Please see past medical history above. Axis IV:  Chronic mental illness.  Chronic serious nonpsychiatric medical illness.  Recent suicide attempt.  Recent loss of employment. Axis V:  Global assessment of functioning at the time of admission approximately 20.  Highest global assessment of functioning in past year approximately 65.  PLAN: 1. I would recommend that the patient be restarted on her medication     at Zoloft 200 mg p.o. q.a.m. as soon as possible.  The patient     states that this medication has been helpful in treating her     depression. 2. I would also recommend that the medication Klonopin be restarted at     1 mg p.o. b.i.d. - p.r.n. for anxiety. 3. I would recommend that the Ativan p.r.n. be discontinued that she     would not want to mix 2 benzodiazepines. 4. The patient should remain on one-to-one for safety, although the     patient is reporting to feeling safe while in the hospital.     Considering the severity of the patient's overdose, it would,     however, be proven to continue the one-to-one. 5. Recommend transferring the patient to [facility] for further     treatment and evaluation of her symptoms once the patient is     medically stable and a psychiatric bed is  available.    __________________________________ Franchot Gallo, MD     RR/MEDQ  D:  03/20/2011  T:  03/20/2011  Job:  [id number]  Electronically Signed by Franchot Gallo MD on 03/21/2011 10:01:11 PM

## 2011-03-22 DIAGNOSIS — F329 Major depressive disorder, single episode, unspecified: Secondary | ICD-10-CM

## 2011-03-22 DIAGNOSIS — F3289 Other specified depressive episodes: Secondary | ICD-10-CM

## 2011-03-22 LAB — COMPREHENSIVE METABOLIC PANEL
ALT: 10 U/L (ref 0–35)
AST: 18 U/L (ref 0–37)
Albumin: 2.8 g/dL — ABNORMAL LOW (ref 3.5–5.2)
Alkaline Phosphatase: 33 U/L — ABNORMAL LOW (ref 39–117)
Chloride: 109 mEq/L (ref 96–112)
Potassium: 3.4 mEq/L — ABNORMAL LOW (ref 3.5–5.1)
Sodium: 141 mEq/L (ref 135–145)
Total Bilirubin: 0.3 mg/dL (ref 0.3–1.2)

## 2011-03-22 LAB — DIFFERENTIAL
Basophils Relative: 0 % (ref 0–1)
Eosinophils Absolute: 0.1 10*3/uL (ref 0.0–0.7)
Eosinophils Relative: 2 % (ref 0–5)
Monocytes Absolute: 0.4 10*3/uL (ref 0.1–1.0)
Monocytes Relative: 10 % (ref 3–12)
Neutro Abs: 2.6 10*3/uL (ref 1.7–7.7)

## 2011-03-22 LAB — CBC
Platelets: 165 10*3/uL (ref 150–400)
RBC: 3.74 MIL/uL — ABNORMAL LOW (ref 3.87–5.11)
RDW: 13.4 % (ref 11.5–15.5)
WBC: 4.3 10*3/uL (ref 4.0–10.5)

## 2011-03-22 LAB — PROTIME-INR: INR: 1.08 (ref 0.00–1.49)

## 2011-03-22 LAB — GLUCOSE, CAPILLARY
Glucose-Capillary: 121 mg/dL — ABNORMAL HIGH (ref 70–99)
Glucose-Capillary: 122 mg/dL — ABNORMAL HIGH (ref 70–99)

## 2011-03-23 LAB — ACID PHOSPHATASE, PROSTATIC (PAP): Prostatic Acid Phos: 0.8 ng/mL (ref <2.8)

## 2011-03-23 NOTE — H&P (Signed)
Penny Kim, Penny Kim NO.:  [id number]  MEDICAL RECORD NO.:  [id number]  LOCATION:  WLED                         FACILITY:  [facility]  PHYSICIAN:  Jeoffrey Massed, MD    DATE OF BIRTH:  [birth year]  DATE OF ADMISSION:  03/19/2011 DATE OF DISCHARGE:                             HISTORY & PHYSICAL   PRIMARY CARE PRACTITIONER:  Unknown.  CHIEF COMPLAINT:  Overdose with Tylenol PM some time late this morning. She also consumed 140 ounces of beer per EMS note.  HISTORY OF PRESENT ILLNESS:  Please note that this patient is unable to participate in the history taking process.  She is slightly confused, has some slurred speech so most of this history is obtained either from the emergency department notes or from the EMS note.  Per EMS note, the patient stated to them that she tried to kill herself as she was fired yesterday by taking approximately 50 Tylenol PM and 40 ounces of beer.  Please note that the Tylenol PM per EMS note contained 50 mg of acetaminophen and 25 mg of diphenhydramine.  The patient was then brought to the ED and per my conversation with the emergency department physician assistant since the exact time of ingestion was unknown, but believed to be more than 2 hours prior to the patient presenting to the ED and because of altered mental status, activated charcoal was not given.  Her first Tylenol level done at 1315 hours was 113.  After talking with Poison Control, the ED staff repeated another Tylenol level at 1640 hours and this time, it was 180.  After consultation with the Poison Control, the patient has been started on IV Mucomyst already. The hospitalist service was then asked to admit this patient for further evaluation and ongoing management.  During my evaluation of this patient, the patient is slightly lethargic, but awakens with verbal commands.  She has slurred speech and it is very difficult to discern what she is exactly saying.  She  appears unsteady when she attempts to sit up.  She seems a bit confused, but at times is able to follow commands.  There are no family members at bedside.  It is unknown whether the patient has had prior suicidal attempts in the past.  Review of the e-Chart, does show that she did have breast cancer and she is status post mastectomy.  Review of the e-Chart also suggest that the patient did undergo chemotherapy and that was stopped because of some toxic effects.  The records also she takes Zoloft and likely may have underlying depression as well.  Please note in the paper chart, there is a suicide note that was left at the scene by the patient and then brought to the hospital by EMS I presume.  Also, please note that on the paper chart, there is a termination letter from her employer that was dated March 17, 2011.  In any event, the patient does not appear to have any shortness of breath, does not appear to have stridor, she is not drooling, and is now being admitted to the hospitalist service for further evaluation and treatment.  Further history is unobtainable.  ALLERGIES:  Unknown.  PAST MEDICAL HISTORY: 1. Likely breast cancer, status post mastectomy. 2. Likely depression. 3. Rest unknown.  PAST SURGICAL HISTORY: 1. Mastectomy. 2. Status post placement and removal of Port-A-Cath. 3. Rest unknown.  MEDICATIONS AT HOME:  Per what I have available in the chart from our pharmacy team, the patient apparently is on the following medications: 1. Vicodin 5/500 mg 1 to 2 tablets p.o. q.6 h p.r.n. 2. Tylenol PM 500/25 mg 1 to 2 tablets at bedtime. 3. Ferrous sulfate 65 mg 1 tablet daily. 4. Zantac 150 mg 1 to 2 tablets daily. 5. Tamoxifen 20 mg 1 tablet daily. 6. Sertraline 100 mg 1 tablet every morning. 7. Clonazepam 1 mg 1 tablet twice daily. 8. Multivitamins 1 tablet daily.  FAMILY HISTORY:  Unknown.  SOCIAL HISTORY:  Unknown.  REVIEW OF SYSTEMS:  Very difficult to ascertain;  however, the pertinent positives are noted in the HPI.  PHYSICAL EXAMINATION:  GENERAL:  The patient is lying in bed, not in any acute distress, but clearly confused with slurred speech.  She is not acutely short of breath, does not have stridor, and does not have drooling. VITAL SIGNS:  Initial vital signs showed temperature of 98.0, heart rate of 79, blood pressure of 139/67, respiration of 12, and pulse oximetry of 98% on room air.  Last set of vital signs shows a heart rate of 99, respiration of 23, blood pressure of 142/89, and pulse oximetry of 100% on 2 L per minute. HEENT:  Atraumatic and normocephalic.  Pupils around 4 to 5 mm in size and reactive bilaterally.  Oral mucosa appears dry. NECK:  Supple. CHEST:  Clear to auscultation bilaterally. CARDIOVASCULAR:  Heart sounds are slightly tachycardic, but no murmurs heard. ABDOMEN:  Appears soft, good bowel sounds.  It does not appear to be distended or tender. EXTREMITIES:  No edema. NEUROLOGIC:  The patient has slurred speech, but is able to move all of her 4 extremities and withdraws them to pain.  She does not appear to have any gross focal deficits on exam.  Given her mental status, it is very difficult to do with her neurological exam.  LABORATORY DATA: 1. CBC shows a WBC of 3.7, hemoglobin of 14.0, hematocrit of 41.7, and     platelet count of 191. 2. First set of acetaminophen level done on 1315 hours is 113.1. 3. Alcohol level is 63. 4. Salicylate level less than 2. 5. Chemistry shows sodium of 138, potassium of 4.1, chloride of 102,     bicarbonate of 25, glucose of 68, BUN of 11, creatinine of 0.76,     and calcium of 9.5. 6. LFTs shows AST of 24, ALT of 15, total protein of 8.0, and albumin     of 4.1.  Alkaline phosphatase is 54 and total bilirubin is 0.3. 7. A repeat acetaminophen level done on 1630 hours has increased to     180. 8. ABG on 2 L of oxygen via nasal cannula, shows a pH of 7.354, pCO2     of  44.7, and pO2 of 176. 9. INR is 0.97.  RADIOLOGICAL STUDIES:  None.  ASSESSMENT: 1. Tylenol overdose with suicidal attempt. 2. Benadryl overdose with some evidence of anticholinergic effects.     Per RN, the patient did have a distended bladder and a Foley     catheter has been placed.  The patient appears confused as well. 3. Altered mental status secondary to toxic encephalopathy probably     secondary to anticholinergic  effects of Benadryl as well as EtOH     intake. 4. Acute urinary retention secondary to anticholinergic effects of     Benadryl. 5. Suicidal attempt with possible underlying depression.  PLAN: 1. The patient will be admitted to the ICU for now for further close     observation. 2. I have personally spoken to [place] at [facility] and she advises     to continue the Mucomyst, which has already been started.  Their     recommendations are to repeat LFT along with PT/INR 1 hour before     the Mucomyst infusion is complete and this has been ordered.  For     anticholinergic effects of Benadryl, she suggest supportive     measures such as hydration and to use benzodiazepines for agitation     and confusion. 3. A sitter will be placed at bedside. 4. The patient will be monitored very closely in the ICU and further     plan will depend as the patient's clinical course evolves. 5. She appears to be able to protect her airway currently and we will     continue to assess her as well. 6. DVT prophylaxis with Lovenox. 7. Upon hopefully improvement of the patient's clinical status, a     psychiatric consultation will need to be placed. 8. No family available at bedside.  We will presume that this patient     is a full code.  Total critical care time spent equals 65 minutes.     Jeoffrey Massed, MD     SG/MEDQ  D:  03/19/2011  T:  03/19/2011  Job:  [id number]  cc:   Lowella Dell, M.D. Fax: [phone]  Electronically Signed by Jeoffrey Massed  on 03/23/2011  03:36:33 PM

## 2011-03-30 NOTE — Discharge Summary (Signed)
Penny Kim, ANCONA NO.:  [id number]  MEDICAL RECORD NO.:  [id number]  LOCATION:  1512                         FACILITY:  [facility]  PHYSICIAN:  Kathlen Mody, MD       DATE OF BIRTH:  [birth year]  DATE OF ADMISSION:  03/19/2011 DATE OF DISCHARGE:  03/22/2011                              DISCHARGE SUMMARY   DISCHARGE DIAGNOSES: 1. Acetaminophen overdose. 2. Benadryl overdose. 3. Depression. 4. Suicidal ideation. 5. History of breast cancer. 6. Gastroesophageal reflux disease. 7. Anxiety. 8. Hypokalemia. 9. Normocytic anemia.  DISCHARGE MEDICATIONS: 1. Zoloft 200 mg daily. 2. Klonopin 1 mg 1 tablet twice a day p.r.n. 3. Multivitamin powder 1 tablespoon daily as needed. 4. Tamoxifen 20 mg daily. 5. Zantac 150 mg daily.  CONSULTANTS:  Psychiatric consult for suicidal ideations.  PERTINENT LABORATORY AND X-RAY DATA:  On admission, the patient had a CBC done significant for WBC count 3.7.  Urine drug screen positive for tetrahydrocannabinol. Urinalysis negative for nitrites and leukocytes. Urine pregnancy test negative.  Acetaminophen level was 113.  Alcohol level was 63. Aspirin level was less than 2.  Comprehensive metabolic panel significant for a glucose of 68.  A repeat acetaminophen level was 180. ABG showed a pO2 of 176, pH of 7.354.  MRSA PCR screen negative. INR was 1.11.  Repeat acetaminophen level less than 15.  Liver function tests showed an elevated AST of 38 but normal ALT of 14, alkaline phosphatase was 39, total bilirubin was 0.2.  On the day of discharge, the patient's WBC count was 4.3, hemoglobin of 10.5, MCV of 82.6, platelets of 165.  Comprehensive metabolic panel significant for a potassium of 3.4, glucose of 113.  AST, ALT within normal limits. Alkaline phosphatase was 33, total bilirubin was 0.3.  The patient had a CT head without contrast which showed no acute intracranial abnormalities.  BRIEF HISTORY AND PHYSICAL:  This is a  40 year old lady with a history of anxiety and depression, who was brought into the ER for acetaminophen and Benadryl overdose.  When she came in, she was in altered mental status secondary to toxic encephalopathy secondary to overdose from acetaminophen and Benadryl and also EtOH intake.  BRIEF HOSPITAL COURSE:  The patient was admitted to ICU for close observation.  She was in the ICU for 48 hours.  Poison control was advised.  She was given Mucomyst while in the ER.  It was discontinued on March 20, 2011.  Her LFTs during her hospitalization were within normal limits.  Her PT/INR on the day of discharge was 1.08 and 14, was within normal limits.  The patient at this time appears to be medically stable for transfer to [facility].  A psychiatric consult was called while she was in the ICU for a suicidal attempt and acetaminophen overdose.  They recommended continuing the patient on Zoloft for her depression, also recommended continuing Klonopin and recommended to discontinue Ativan p.r.n., that the patient should remain on one-to-one for safety and recommended the patient to be transferred to [facility] for further treatment and evaluation of her symptoms.  The patient requested to be transferred to [facility] and a bed was  available at [facility] and she is being discharged today.  On the day of discharge, the patient's vitals include temperature of 99, pulse of 93, blood pressure 130/85, respirations 18, saturating 93% on room air. General:  She is alert, afebrile, oriented x3, comfortable in no acute distress.  Cardiovascular:  S1, S2 heard.  Respiratory:  Chest clear to auscultation bilaterally, no wheezing or rhonchi.  Abdomen:  Soft, nontender, nondistended.  Extremities:  No pedal edema.  The patient is hemodynamically stable for discharge to [facility].  For hypokalemia, the patient was given 14 mEq of  K-Dur.  FOLLOWUP:  The patient should follow up at the [facility] with the physician and also get a comprehensive metabolic panel in 1 to 2 days to check up on her LFTs.          ______________________________ Kathlen Mody, MD     VA/MEDQ  D:  03/22/2011  T:  03/22/2011  Job:  [id number]  Electronically Signed by Kathlen Mody MD on 03/30/2011 02:13:02 AM

## 2011-04-12 ENCOUNTER — Encounter (HOSPITAL_BASED_OUTPATIENT_CLINIC_OR_DEPARTMENT_OTHER): Payer: BC Managed Care – PPO | Admitting: Oncology

## 2011-04-12 ENCOUNTER — Other Ambulatory Visit: Payer: Self-pay | Admitting: Oncology

## 2011-04-12 DIAGNOSIS — C50319 Malignant neoplasm of lower-inner quadrant of unspecified female breast: Secondary | ICD-10-CM

## 2011-04-12 DIAGNOSIS — Z170 Estrogen receptor positive status [ER+]: Secondary | ICD-10-CM

## 2011-04-12 LAB — CBC WITH DIFFERENTIAL/PLATELET
BASO%: 0.4 % (ref 0.0–2.0)
HCT: 36.2 % (ref 34.8–46.6)
MCHC: 34.1 g/dL (ref 31.5–36.0)
MONO#: 0.3 10*3/uL (ref 0.1–0.9)
NEUT#: 1.8 10*3/uL (ref 1.5–6.5)
NEUT%: 57.1 % (ref 38.4–76.8)
WBC: 3.2 10*3/uL — ABNORMAL LOW (ref 3.9–10.3)
lymph#: 1 10*3/uL (ref 0.9–3.3)

## 2011-04-12 LAB — COMPREHENSIVE METABOLIC PANEL
ALT: 11 U/L (ref 0–35)
CO2: 26 mEq/L (ref 19–32)
Calcium: 10 mg/dL (ref 8.4–10.5)
Chloride: 101 mEq/L (ref 96–112)
Sodium: 140 mEq/L (ref 135–145)
Total Protein: 7.4 g/dL (ref 6.0–8.3)

## 2011-04-12 LAB — CANCER ANTIGEN 27.29: CA 27.29: 13 U/mL (ref 0–39)

## 2011-06-14 ENCOUNTER — Other Ambulatory Visit: Payer: Self-pay | Admitting: Oncology

## 2011-06-14 ENCOUNTER — Encounter (HOSPITAL_BASED_OUTPATIENT_CLINIC_OR_DEPARTMENT_OTHER): Payer: Self-pay | Admitting: Oncology

## 2011-06-14 DIAGNOSIS — Z9010 Acquired absence of unspecified breast and nipple: Secondary | ICD-10-CM

## 2011-06-14 DIAGNOSIS — C50319 Malignant neoplasm of lower-inner quadrant of unspecified female breast: Secondary | ICD-10-CM

## 2011-06-14 DIAGNOSIS — Z170 Estrogen receptor positive status [ER+]: Secondary | ICD-10-CM

## 2011-06-14 DIAGNOSIS — C50919 Malignant neoplasm of unspecified site of unspecified female breast: Secondary | ICD-10-CM

## 2011-06-14 DIAGNOSIS — F341 Dysthymic disorder: Secondary | ICD-10-CM

## 2011-06-14 LAB — CBC WITH DIFFERENTIAL/PLATELET
BASO%: 0.9 % (ref 0.0–2.0)
EOS%: 6.6 % (ref 0.0–7.0)
MCH: 29.8 pg (ref 25.1–34.0)
MCHC: 34.4 g/dL (ref 31.5–36.0)
RDW: 13.1 % (ref 11.2–14.5)
lymph#: 0.9 10*3/uL (ref 0.9–3.3)

## 2011-06-14 LAB — COMPREHENSIVE METABOLIC PANEL
AST: 25 U/L (ref 0–37)
Albumin: 4.1 g/dL (ref 3.5–5.2)
Alkaline Phosphatase: 39 U/L (ref 39–117)
BUN: 11 mg/dL (ref 6–23)
Potassium: 3.8 mEq/L (ref 3.5–5.3)
Sodium: 141 mEq/L (ref 135–145)
Total Protein: 7.1 g/dL (ref 6.0–8.3)

## 2011-07-27 ENCOUNTER — Telehealth: Payer: Self-pay | Admitting: Oncology

## 2011-07-27 NOTE — Telephone Encounter (Signed)
pt called and r/s appt on 11/26 to 12/04

## 2011-08-10 ENCOUNTER — Encounter (INDEPENDENT_AMBULATORY_CARE_PROVIDER_SITE_OTHER): Payer: Self-pay | Admitting: General Surgery

## 2011-08-24 ENCOUNTER — Ambulatory Visit (HOSPITAL_BASED_OUTPATIENT_CLINIC_OR_DEPARTMENT_OTHER): Payer: BC Managed Care – PPO | Admitting: Physician Assistant

## 2011-08-24 ENCOUNTER — Other Ambulatory Visit (HOSPITAL_BASED_OUTPATIENT_CLINIC_OR_DEPARTMENT_OTHER): Payer: BC Managed Care – PPO

## 2011-08-24 ENCOUNTER — Other Ambulatory Visit: Payer: Self-pay | Admitting: *Deleted

## 2011-08-24 ENCOUNTER — Encounter: Payer: Self-pay | Admitting: Physician Assistant

## 2011-08-24 ENCOUNTER — Other Ambulatory Visit: Payer: Self-pay | Admitting: Oncology

## 2011-08-24 VITALS — BP 151/93 | HR 60 | Temp 98.1°F | Ht 64.5 in | Wt 139.5 lb

## 2011-08-24 DIAGNOSIS — C50319 Malignant neoplasm of lower-inner quadrant of unspecified female breast: Secondary | ICD-10-CM

## 2011-08-24 DIAGNOSIS — Z170 Estrogen receptor positive status [ER+]: Secondary | ICD-10-CM

## 2011-08-24 DIAGNOSIS — C50919 Malignant neoplasm of unspecified site of unspecified female breast: Secondary | ICD-10-CM

## 2011-08-24 DIAGNOSIS — F411 Generalized anxiety disorder: Secondary | ICD-10-CM

## 2011-08-24 DIAGNOSIS — F341 Dysthymic disorder: Secondary | ICD-10-CM

## 2011-08-24 DIAGNOSIS — C50911 Malignant neoplasm of unspecified site of right female breast: Secondary | ICD-10-CM

## 2011-08-24 DIAGNOSIS — I10 Essential (primary) hypertension: Secondary | ICD-10-CM

## 2011-08-24 DIAGNOSIS — C50211 Malignant neoplasm of upper-inner quadrant of right female breast: Secondary | ICD-10-CM | POA: Insufficient documentation

## 2011-08-24 DIAGNOSIS — F419 Anxiety disorder, unspecified: Secondary | ICD-10-CM

## 2011-08-24 LAB — CBC WITH DIFFERENTIAL/PLATELET
Basophils Absolute: 0.1 10*3/uL (ref 0.0–0.1)
EOS%: 3.9 % (ref 0.0–7.0)
HGB: 12.7 g/dL (ref 11.6–15.9)
LYMPH%: 36.5 % (ref 14.0–49.7)
MCH: 28.3 pg (ref 25.1–34.0)
MCV: 82.9 fL (ref 79.5–101.0)
MONO%: 11.3 % (ref 0.0–14.0)
Platelets: 200 10*3/uL (ref 145–400)
RDW: 12.9 % (ref 11.2–14.5)

## 2011-08-24 MED ORDER — TAMOXIFEN CITRATE 20 MG PO TABS: 20.0000 mg | ORAL_TABLET | Freq: Every day | ORAL | Status: DC

## 2011-08-24 NOTE — Progress Notes (Signed)
Hematology and Oncology Follow Up Visit  Penny Kim [id number] 01/18/1971 40 y.o. 08/24/2011    HPI: Penny Kim is a 40 year old [place] woman referred by Dr. Dwain Sarna for evaluation and treatment of her right breast carcinoma. Patient had her first mammogram, a screening mammogram, at [facility] on 04/23/2010, showing diffuse calcifications in the lower inner quadrant of the right breast, measuring up to 5.6 cm. She was recalled for additional studies and on 04/27/2010 the breast was found to be dense, limiting mammographic sensitivity. It was again able to demonstrate calcifications throughout the lower inner quadrant of the right breast. 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  The patient was referred to Dr. Dwain Sarna. Bilateral breast MRIs were obtained 05/03/2010, showing in the right breast an area of enhancement totaling 7.7 cm. Most of this was nodular and non-masslike, but in addition, in the anterior aspect, there was a rounded area of masslike enhancement measuring 1.7 cm and suspicious for invasive ductal carcinoma. The left breast was unremarkable, no suspicious lymph nodes noted.  Additional biopsy was obtained for the more masslike area on 05/05/2010, ([id number]) showing a grade 2 invasive ductal carcinoma, ER +78%, PR +82%. 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 Proliferation marker was elevated at 85%.  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 0 of 4 lymph nodes were involved and there were ample margins. Tissue expanders were placed.  The patient was treated with adjuvant chemotherapy and received 4 dose dense cycles of doxorubicin and cyclophosphamide. 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 Started on tamoxifen in March of 2012, continues now at 20 mg daily. The plan is to continue tamoxifen for 5 years, then switch to an aromatase inhibitor for an additional 5 years.  Patient carries a BRCA2 mutation of unknown significance. The issue of whether she should undergo bilateral salpingo-oophorectomy will continue to be addressed, the patient having not had a menstrual cycle for almost one year now.  Interim History:   Patient returns today for routine followup of her breast carcinoma. Interval history is remarkable for the patient beginning an online breast cancer blog. She has been writing music. She finds that these activities are really helping her in dealing with her history of depression and anxiety, although those problems persist. She was being seen by her counseling intern, and is now scheduled to meet with Dr. Enzo Bi to continue therapy next week. She was previously evaluated by Dr. Evelene Croon is currently not seen in their office. She has a prescription for Klonopin which she utilizes occasionally, but is currently not on an antidepressant. She denies any current suicidal ideations.  Patient continues to be out of work which is extremely troublesome and causes quite a bit of stress in itself. She does get some financial support which is very helpful. She does need a refill of tamoxifen and we can call that in through [facility] pharmacy.  With regards to the tamoxifen, the patient is tolerating it well. She's had no menstrual cycles. She tells me she is not currently sexually active, but we did discuss the fact that she could in fact get pregnant, and needs to utilize barrier birth control methods. She has no significant hot flashes. No change in vision. No peripheral swelling  or evidence of abnormal clotting.  The patient still has her tissue expanders intact. Her reconstruction surgery has been put on hold for now secondary to lack of finances, and  lack of insurance. She is followed by Dr. Odis Luster.  A detailed review of systems is otherwise noncontributory as noted below.  Review of Systems: Constitutional:  no weight loss, fever, night sweats and feels well Eyes: negative [id number] Cardiovascular: no chest pain or dyspnea on exertion Respiratory: no cough, shortness of breath, or wheezing Neurological: negative Dermatological: negative Gastrointestinal: no abdominal pain, change in bowel habits, or black or bloody stools Genito-Urinary: no dysuria, trouble voiding, or hematuria Hematological and Lymphatic: negative Breast: negative Musculoskeletal: negative Remaining ROS negative.  Medications: I have reviewed the patient's current medications. Current Outpatient Prescriptions  Medication Sig Dispense Refill  . clonazePAM (KLONOPIN) 1 MG tablet Take 1 mg by mouth 2 (two) times daily as needed.        . tamoxifen (NOLVADEX) 20 MG tablet Take 1 tablet (20 mg total) by mouth daily.  30 tablet  6    Allergies:  Allergies  Allergen Reactions  . Prochlorperazine Other (See Comments)    "Passsing out"  . Methadone Hcl Itching  . Tape Rash     Physical Exam: Filed Vitals:   08/24/11 1207  BP: 151/93  Pulse: 60  Temp: 98.1 F (36.7 C)   HEENT:  Sclerae anicteric, conjunctivae pink.  Oropharynx clear.  No mucositis or candidiasis.  Nodes:  No cervical, supraclavicular, or axillary lymphadenopathy palpated.  Breast Exam:  Patient is status post bilateral mastectomies with tissue expanders intact. Incisions are well healed. No suspicious nodularities, skin changes, or evidence of infection or recurrence of disease. Lungs:  Clear to auscultation bilaterally.  No crackles, rhonchi, or wheezes.  Heart:  Regular rate and rhythm.  Abdomen:  Soft, nontender.  Positive bowel sounds.  No organomegaly or masses palpated.  Musculoskeletal:  No focal spinal tenderness to palpation.  Extremities:  Benign.  No peripheral edema or  cyanosis.  Skin:  Benign.  Neuro:  Nonfocal.   Lab Results: Lab Results  Component Value Date   WBC 4.1 08/24/2011   HGB 12.7 08/24/2011   HCT 37.2 08/24/2011   MCV 82.9 08/24/2011   PLT 200 08/24/2011   NEUTROABS 1.8 08/24/2011     Chemistry      Component Value Date/Time   NA 141 06/14/2011 1211   NA 141 06/14/2011 1211   K 3.8 06/14/2011 1211   K 3.8 06/14/2011 1211   CL 105 06/14/2011 1211   CL 105 06/14/2011 1211   CO2 26 06/14/2011 1211   CO2 26 06/14/2011 1211   BUN 11 06/14/2011 1211   BUN 11 06/14/2011 1211   CREATININE 0.96 06/14/2011 1211   CREATININE 0.96 06/14/2011 1211      Component Value Date/Time   CALCIUM 9.3 06/14/2011 1211   CALCIUM 9.3 06/14/2011 1211   ALKPHOS 39 06/14/2011 1211   ALKPHOS 39 06/14/2011 1211   AST 25 06/14/2011 1211   AST 25 06/14/2011 1211   ALT 18 06/14/2011 1211   ALT 18 06/14/2011 1211   BILITOT 0.3 06/14/2011 1211   BILITOT 0.3 06/14/2011 1211            Radiological Studies:  No results found.   Impression and Plan: 40 year old [place] woman, status post bilateral mastectomies in September 2011 for a right sided T2 N0, stage II a, grade 3 invasive ductal carcinoma. Tumor was ER and PR positive,  HER-2/neu negative, with an elevated proliferation fraction. Status post adjuvant chemotherapy, consisting of 4 cycles of dose dense doxorubicin and cyclophosphamide followed by 4 weekly doses of paclitaxel. Paclitaxel was discontinued after 4 doses secondary to neuropathy which has resolved. Patient was started on tamoxifen in March of 2012 on which she continues with good tolerance. The plan is to continue for total of 5 years, followed by aromatase inhibitor for an additional 5 years. The patient did not require radiation therapy. She does carry a BRCA2 mutation of uncertain significance.  With regards to her breast cancer, the patient continues to do well. We are refilling her tamoxifen through [facility] pharmacy as requested.  As noted above,  the patient is scheduled to meet with our psychologists next week. We will continue to follow her here for her closely, on a Q2 month basis for now, until her situation is a little more stabilized. Accordingly she'll return here for repeat labs and physical exam in February 2013.  This plan was reviewed with the patient, who voices understanding and agreement.  She knows to call with any changes or problems.    Arna Luis, PA-C 08/24/2011

## 2011-08-25 ENCOUNTER — Telehealth: Payer: Self-pay | Admitting: Oncology

## 2011-08-25 NOTE — Telephone Encounter (Signed)
Gv pt appt for [id number].  scheduled pt for appt with Dr. Gwenlyn Saran @ [facility] for 08/26/2011 @ 1:30pm

## 2011-08-26 ENCOUNTER — Ambulatory Visit (INDEPENDENT_AMBULATORY_CARE_PROVIDER_SITE_OTHER): Payer: Self-pay | Admitting: Family Medicine

## 2011-08-26 ENCOUNTER — Encounter: Payer: Self-pay | Admitting: Family Medicine

## 2011-08-26 ENCOUNTER — Telehealth: Payer: Self-pay | Admitting: Oncology

## 2011-08-26 VITALS — BP 114/76 | HR 64 | Temp 98.5°F | Ht 64.0 in | Wt 140.0 lb

## 2011-08-26 DIAGNOSIS — R030 Elevated blood-pressure reading, without diagnosis of hypertension: Secondary | ICD-10-CM

## 2011-08-26 DIAGNOSIS — Z0000 Encounter for general adult medical examination without abnormal findings: Secondary | ICD-10-CM

## 2011-08-26 DIAGNOSIS — C50919 Malignant neoplasm of unspecified site of unspecified female breast: Secondary | ICD-10-CM

## 2011-08-26 DIAGNOSIS — C50911 Malignant neoplasm of unspecified site of right female breast: Secondary | ICD-10-CM

## 2011-08-26 NOTE — Patient Instructions (Signed)
It was great meeting you today! For your blood pressure was like for you to take a log of your blood pressures. Like we talked about, take it after having been at rest for about 5 minutes. I would like to see you back in 3-4 weeks to go over the log with you. Continue exercising as this will help your blood pressure as well. For your anxiety and depression keep your appointments with Merry Proud. We can potentially talk about medications at your next visit if this is something you are interested in.

## 2011-08-26 NOTE — Telephone Encounter (Signed)
Made a note °

## 2011-08-28 NOTE — Progress Notes (Signed)
  Subjective:    Patient ID: Penny Kim, female    DOB: Dec 25, 1970, 40 y.o.   MRN: [id number]  HPI From-year-old female with history of invasive ductal carcinoma in the right breast, grade 3, who presents as a new patient to establish care. She was referred by her oncologist to followup on elevated blood pressure. She was found to have a blood pressure of 158/94 at her last oncology visit. She denies any shortness of breath any chest pain other than her breast tenderness, any lower extremity swelling, any changes in vision. She does have some mild bitemporal headache that she associates with increased stress.  She reports feelings of depression and anxiety. She sees a psychologist Enzo Bi. She reports a suicide attempt where she took multiple Tylenol PM's with alcohol. This occurred 6 or 7 months ago right when she was laid off from her job. She occasionally takes klonopin but ran out recently. She was on citalopram and klonopin at the time of her suicide attempt and feels like they did not help given the fact that she tried to hurt herself while on them. She feels like counseling is probably the best solution at this point. She has been blogging about her breast cancer experience as well as fighting for what she thinks was an unlawful termination of employment. She sees these things as giving her something to work for. She denies any current suicideation. She does report feeling anxious and having felt anxious for this doctor's appointment. She admits to smoking marijuana the morning before the office visit to calm her down.  Health maintenance: Pap smear: June 2012 at [facility] Lipid panel: unknown Mammogram: up to date Flu shot: not this season   Review of Systems Negative except per history of present illness    Objective:   Physical Exam Filed Vitals:   08/26/11 1352  BP: 114/76  Pulse: 64  Temp: 98.5 F (36.9 C)    Physical Examination: General appearance - alert,  well appearing, and in no distress Eyes - pupils equal and reactive, extraocular eye movements intact Nose - normal and patent, no erythema, discharge or polyps Mouth - mucous membranes moist, pharynx normal without lesions Neck - supple, no enlarged thyroid Chest - clear to auscultation, no wheezes, rales or rhonchi, symmetric air entry Heart - normal rate, regular rhythm, normal S1, S2, no murmurs, rubs, clicks or gallops Abdomen - soft, nontender, nondistended, no masses or organomegaly Extremities - peripheral pulses normal, no pedal edema, no clubbing or cyanosis      Assessment & Plan:

## 2011-08-29 ENCOUNTER — Encounter: Payer: Self-pay | Admitting: Family Medicine

## 2011-08-29 DIAGNOSIS — R030 Elevated blood-pressure reading, without diagnosis of hypertension: Secondary | ICD-10-CM | POA: Insufficient documentation

## 2011-08-29 DIAGNOSIS — Z0000 Encounter for general adult medical examination without abnormal findings: Secondary | ICD-10-CM | POA: Insufficient documentation

## 2011-08-29 NOTE — Assessment & Plan Note (Signed)
Patient referred by oncologist for elevated blood pressure in the office. Her blood pressure at this visit was within normal range at 114/76. Elevated blood pressure at oncologist could have been stress related. Nonetheless, patient to measure her blood pressure at local pharmacy and bring back log of blood pressures at follow up visit.

## 2011-08-29 NOTE — Assessment & Plan Note (Signed)
Up to date on Pap smear that she had in June 2012.  Received flu shot today.

## 2011-08-29 NOTE — Assessment & Plan Note (Signed)
Patient to see oncologist in February. Ran out of Tamoxifen but will call oncologist office for refill.

## 2011-08-30 ENCOUNTER — Ambulatory Visit (INDEPENDENT_AMBULATORY_CARE_PROVIDER_SITE_OTHER): Payer: Self-pay | Admitting: Psychiatry

## 2011-08-30 DIAGNOSIS — F1220 Cannabis dependence, uncomplicated: Secondary | ICD-10-CM

## 2011-08-30 DIAGNOSIS — F331 Major depressive disorder, recurrent, moderate: Secondary | ICD-10-CM

## 2011-08-30 DIAGNOSIS — F0630 Mood disorder due to known physiological condition, unspecified: Secondary | ICD-10-CM

## 2011-08-31 NOTE — Progress Notes (Signed)
Patient seen for initial psychological evaluation.  Patient quite distraught over lack of employment despite having an MBA degree, financial problems, fears about the future, h & h.  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 Patient self-medicating with THC on a daily basis (one pipe full per day).  Patient agreeable to engaging in psychotherapy to address these issues.  Next appointment 09-06-2011.

## 2011-09-06 ENCOUNTER — Ambulatory Visit: Payer: Self-pay | Admitting: Psychiatry

## 2011-09-06 NOTE — Progress Notes (Signed)
Patient called to cancel today's appointment because she has no ride and has a cold.  Next appointment is 09-09-2011.

## 2011-09-09 ENCOUNTER — Ambulatory Visit: Payer: Self-pay | Admitting: Psychiatry

## 2011-09-09 NOTE — Progress Notes (Signed)
Patient was a no show for today's appointment at 2 PM.  Attempted to reach patient by phone without success.

## 2011-09-27 ENCOUNTER — Ambulatory Visit (INDEPENDENT_AMBULATORY_CARE_PROVIDER_SITE_OTHER): Payer: Self-pay | Admitting: Psychiatry

## 2011-09-27 DIAGNOSIS — F1220 Cannabis dependence, uncomplicated: Secondary | ICD-10-CM

## 2011-09-27 DIAGNOSIS — F0630 Mood disorder due to known physiological condition, unspecified: Secondary | ICD-10-CM

## 2011-09-27 DIAGNOSIS — F331 Major depressive disorder, recurrent, moderate: Secondary | ICD-10-CM

## 2011-09-27 NOTE — Progress Notes (Unsigned)
Patient seen for individual psychotherapy.  Session focused on psychological tools to help patient develop internal locus of control, set limits with others, overcome terminal insomnia and develop game plan for getting job she would enjoy.  Next appointment 10-06-2011.

## 2011-10-03 IMAGING — PT NM PET TUM IMG RESTAG (PS) SKULL BASE T - THIGH
6 series · 25 of 25 positions shown · non-contrast
Comparison: Chest CT done today.

CLINICAL DATA: Right breast cancer status post bilateral
mastectomy 06/19/2010.

INITIAL NUCLEAR MEDICINE FDG PET CT TUMOR IMAGING- (SKULL BASE
THROUGH THIGHS)
TECHNIQUE: 15.4 mCi F-18 FDG was injected intravenously via the
left antecubital fossa.  Full-ring PET imaging was performed from
the skull base through the mid-thighs 58  minutes after injection.
CT data was obtained and used for attenuation correction and
anatomic localization only.  (This was not acquired as a diagnostic
CT examination.)
Fasting Blood Glucose:  90

[Series 1: pet ac · axial · 3.3mm · 4.69mm/px · z∈[-870,+0]mm · 5 of 267 slices shown]
[im 1/267]
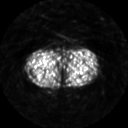
[im 67/267]
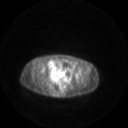
[im 134/267]
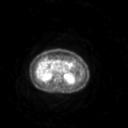
[im 200/267]
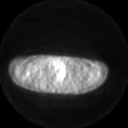
[im 267/267]
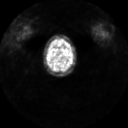

[Series 2: ct images · axial · 3.8mm · 0.98mm/px · z∈[-870,+0]mm · 6 of 267 slices shown]
[im 1/267]
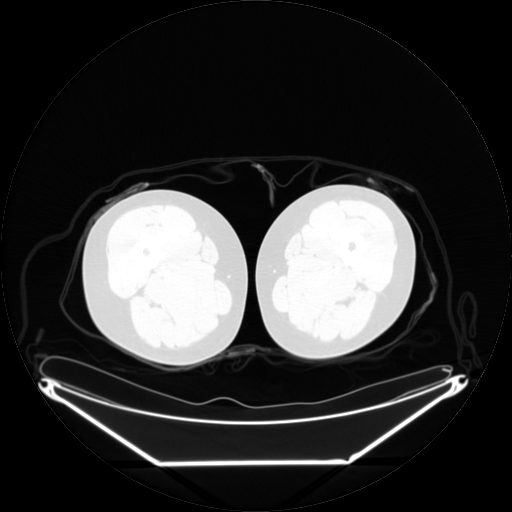
[im 54/267]
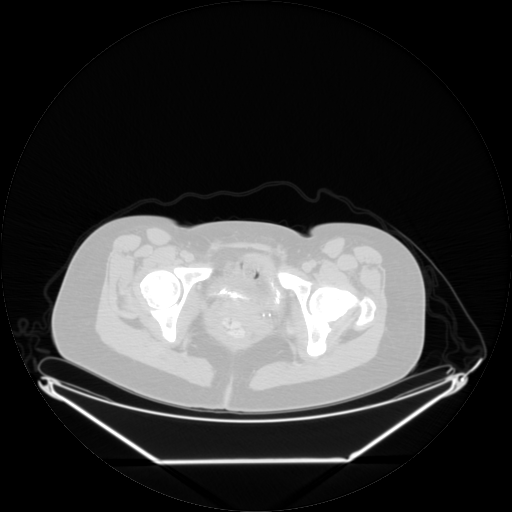
[im 107/267]
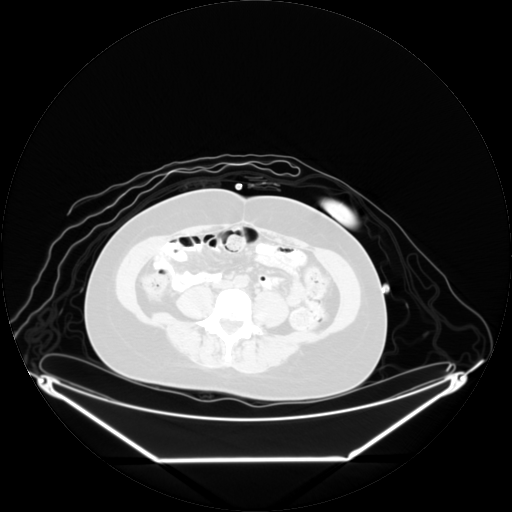
[im 160/267]
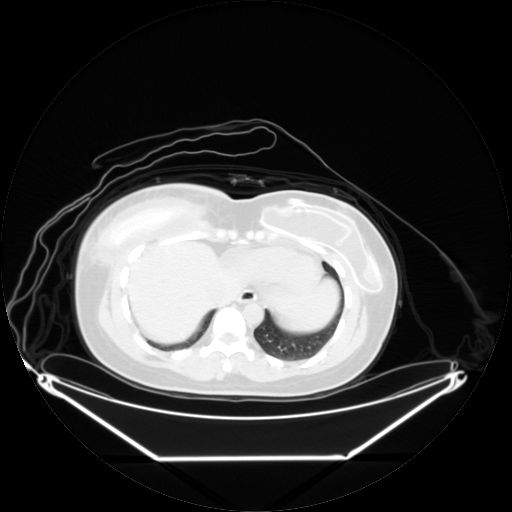
[im 213/267]
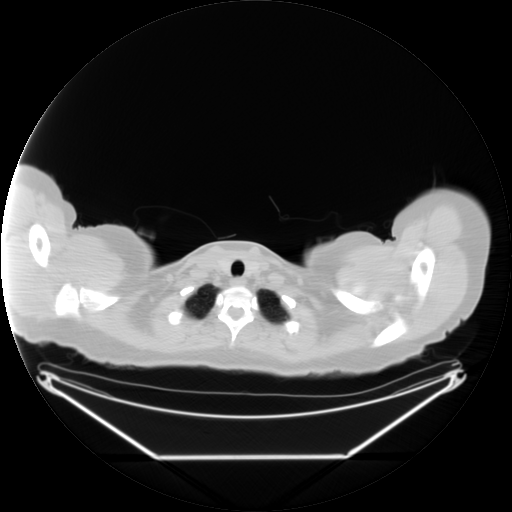
[im 267/267  brain]
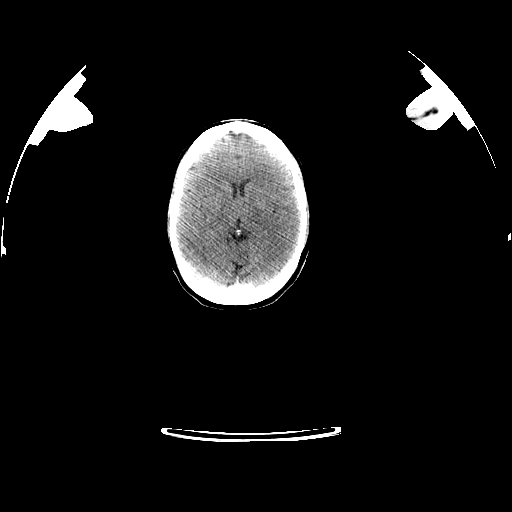

[Series 2: pet nac · axial · 3.3mm · 4.69mm/px · z∈[-870,+0]mm · 6 of 267 slices shown]
[im 1/267]
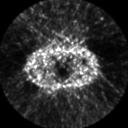
[im 54/267]
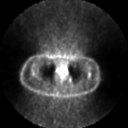
[im 107/267]
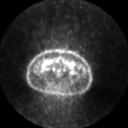
[im 160/267]
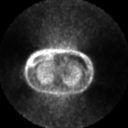
[im 213/267]
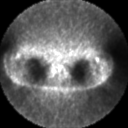
[im 267/267]
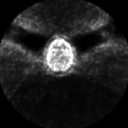

[Series 123: mip · coronal · 3.3mm · 4.69mm/px · 1 of 30 slices shown]
[im 1/30]
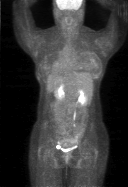

[Series 151: reformatted · axial · 3.3mm · 3.91mm/px · z∈[-870,+0]mm · 6 of 265 slices shown (1 of 2)]
[im 1/265]
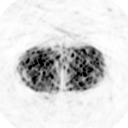
[im 53/265]
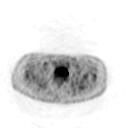
[im 106/265]
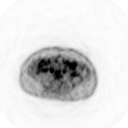
[im 159/265]
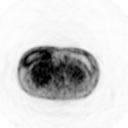
[im 212/265]
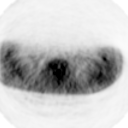
[im 265/265]
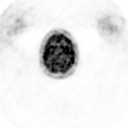

[Series 153: reformatted · coronal · 4.7mm · 6.98mm/px · 1 of 63 slices shown (2 of 2)]
[im 1/63]
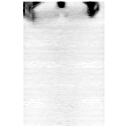

[25 of 25 positions shown; findings below may reference images not displayed]

FINDINGS: There is low-level postsurgical activity surrounding the
bilateral breast implants.  No hypermetabolic lymph nodes are noted
in the axillary, internal mammary, hilar or mediastinal stations.
There is no hypermetabolic nodal activity in the neck, abdomen or
pelvis.

No abnormal metabolic activity is present within the lungs, liver,
adrenal glands or bones.  There is asymmetric posterior extension
of the bladder on the left which could represent a bladder
diverticulum, although is probably a normal variant.
IMPRESSION: No evidence of residual or metastatic disease.

## 2011-10-06 ENCOUNTER — Ambulatory Visit: Payer: Self-pay | Admitting: Family Medicine

## 2011-10-06 ENCOUNTER — Ambulatory Visit (INDEPENDENT_AMBULATORY_CARE_PROVIDER_SITE_OTHER): Payer: Self-pay | Admitting: Psychiatry

## 2011-10-06 DIAGNOSIS — F0630 Mood disorder due to known physiological condition, unspecified: Secondary | ICD-10-CM

## 2011-10-06 DIAGNOSIS — F1220 Cannabis dependence, uncomplicated: Secondary | ICD-10-CM

## 2011-10-06 DIAGNOSIS — F331 Major depressive disorder, recurrent, moderate: Secondary | ICD-10-CM

## 2011-10-06 NOTE — Progress Notes (Unsigned)
Patient seen for individual psychotherapy.  Patient working on career issues, life script issues, and  coping skills. Patient making excellent progress in treatment.  No THC in 2 weeks. Patient has acquired her own transportation and won court case against her former employer. Next appointment 10-13-2011.

## 2011-10-07 ENCOUNTER — Encounter: Payer: Self-pay | Admitting: Oncology

## 2011-10-07 NOTE — Progress Notes (Signed)
Patient received one prescription from [facility] on 10/06/11 $20.15,her remaning balance [facility] $115.28.

## 2011-10-13 ENCOUNTER — Telehealth: Payer: Self-pay | Admitting: Oncology

## 2011-10-13 ENCOUNTER — Ambulatory Visit (INDEPENDENT_AMBULATORY_CARE_PROVIDER_SITE_OTHER): Payer: Self-pay | Admitting: Psychiatry

## 2011-10-13 DIAGNOSIS — F0630 Mood disorder due to known physiological condition, unspecified: Secondary | ICD-10-CM

## 2011-10-13 DIAGNOSIS — F1220 Cannabis dependence, uncomplicated: Secondary | ICD-10-CM

## 2011-10-13 DIAGNOSIS — F331 Major depressive disorder, recurrent, moderate: Secondary | ICD-10-CM

## 2011-10-13 NOTE — Telephone Encounter (Signed)
Spoke with patient, her epp assistance will end on 10/16/2011 she wanted to know if she could get further assistance, I advised patient to re-apply for epp. Gave application to Merry Proud patient stated she had an appointment with her on today.

## 2011-10-18 ENCOUNTER — Ambulatory Visit (HOSPITAL_BASED_OUTPATIENT_CLINIC_OR_DEPARTMENT_OTHER): Payer: Self-pay

## 2011-10-18 DIAGNOSIS — C50919 Malignant neoplasm of unspecified site of unspecified female breast: Secondary | ICD-10-CM

## 2011-10-18 LAB — COMPREHENSIVE METABOLIC PANEL
AST: 21 U/L (ref 0–37)
Albumin: 4.5 g/dL (ref 3.5–5.2)
BUN: 11 mg/dL (ref 6–23)
CO2: 29 mEq/L (ref 19–32)
Calcium: 10 mg/dL (ref 8.4–10.5)
Chloride: 102 mEq/L (ref 96–112)
Potassium: 4 mEq/L (ref 3.5–5.3)

## 2011-10-18 LAB — CBC WITH DIFFERENTIAL/PLATELET
BASO%: 0.5 % (ref 0.0–2.0)
Basophils Absolute: 0 10*3/uL (ref 0.0–0.1)
Eosinophils Absolute: 0.1 10*3/uL (ref 0.0–0.5)
HCT: 39.5 % (ref 34.8–46.6)
HGB: 13.2 g/dL (ref 11.6–15.9)
LYMPH%: 37.6 % (ref 14.0–49.7)
MCHC: 33.5 g/dL (ref 31.5–36.0)
MONO#: 0.4 10*3/uL (ref 0.1–0.9)
NEUT#: 2.1 10*3/uL (ref 1.5–6.5)
NEUT%: 51.3 % (ref 38.4–76.8)
Platelets: 236 10*3/uL (ref 145–400)
WBC: 4.1 10*3/uL (ref 3.9–10.3)
lymph#: 1.6 10*3/uL (ref 0.9–3.3)

## 2011-10-25 ENCOUNTER — Ambulatory Visit: Payer: Self-pay | Admitting: Family Medicine

## 2011-10-25 ENCOUNTER — Other Ambulatory Visit: Payer: BC Managed Care – PPO | Admitting: Lab

## 2011-10-25 ENCOUNTER — Encounter: Payer: Self-pay | Admitting: Physician Assistant

## 2011-10-25 ENCOUNTER — Ambulatory Visit (HOSPITAL_BASED_OUTPATIENT_CLINIC_OR_DEPARTMENT_OTHER): Payer: Self-pay | Admitting: Physician Assistant

## 2011-10-25 ENCOUNTER — Ambulatory Visit (HOSPITAL_COMMUNITY)
Admission: RE | Admit: 2011-10-25 | Discharge: 2011-10-25 | Disposition: A | Discharge status: Home / Self Care | Payer: Self-pay | Source: Ambulatory Visit | Attending: Physician Assistant | Admitting: Physician Assistant

## 2011-10-25 ENCOUNTER — Telehealth: Payer: Self-pay | Admitting: *Deleted

## 2011-10-25 VITALS — BP 149/89 | HR 76 | Temp 97.7°F | Ht 64.5 in | Wt 137.8 lb

## 2011-10-25 DIAGNOSIS — C50919 Malignant neoplasm of unspecified site of unspecified female breast: Secondary | ICD-10-CM

## 2011-10-25 DIAGNOSIS — Z9010 Acquired absence of unspecified breast and nipple: Secondary | ICD-10-CM | POA: Insufficient documentation

## 2011-10-25 DIAGNOSIS — Z853 Personal history of malignant neoplasm of breast: Secondary | ICD-10-CM | POA: Insufficient documentation

## 2011-10-25 DIAGNOSIS — R079 Chest pain, unspecified: Secondary | ICD-10-CM | POA: Insufficient documentation

## 2011-10-25 DIAGNOSIS — C50911 Malignant neoplasm of unspecified site of right female breast: Secondary | ICD-10-CM

## 2011-10-25 NOTE — Telephone Encounter (Signed)
sent to radiology department for an walk in chest x-ray on 10-25-2011 gave patient appointment for lab in march week later to see amy berry

## 2011-10-25 NOTE — Progress Notes (Signed)
Hematology and Oncology Follow Up Visit  Penny Kim [id number] 10-22-1970 41 y.o. 10/25/2011    HPI: Penny Kim is a 41 year old [place] woman referred by Dr. Dwain Sarna for evaluation and treatment of her right breast carcinoma. Patient had her first mammogram, a screening mammogram, at [facility] on 04/23/2010, showing diffuse calcifications in the lower inner quadrant of the right breast, measuring up to 5.6 cm. She was recalled for additional studies and on 04/27/2010 the breast was found to be dense, limiting mammographic sensitivity. It was again able to demonstrate calcifications throughout the lower inner quadrant of the right breast. 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  The patient was referred to Dr. Dwain Sarna. Bilateral breast MRIs were obtained 05/03/2010, showing in the right breast an area of enhancement totaling 7.7 cm. Most of this was nodular and non-masslike, but in addition, in the anterior aspect, there was a rounded area of masslike enhancement measuring 1.7 cm and suspicious for invasive ductal carcinoma. The left breast was unremarkable, no suspicious lymph nodes noted.  Additional biopsy was obtained for the more masslike area on 05/05/2010, ([id number]) showing a grade 2 invasive ductal carcinoma, ER +78%, PR +82%. 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 Proliferation marker was elevated at 85%.  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 0 of 4 lymph nodes were involved and there were ample margins. Tissue expanders were placed.  The patient was treated with adjuvant chemotherapy and received 4 dose dense cycles of doxorubicin and cyclophosphamide. 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 Started on tamoxifen in March of 2012, continues now at 20 mg daily. The plan is to continue tamoxifen for 5 years, then switch to an aromatase inhibitor for an additional 5 years.  Patient carries a BRCA2 mutation of unknown significance. The issue of whether she should undergo bilateral salpingo-oophorectomy will continue to be addressed, the patient having not had a menstrual cycle for almost one year now.  Interim History:   Patient returns today for routine followup of her breast carcinoma. 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 She is also seeing Dr. Enzo Bi on a regular basis. She feels that her depression and anxiety are much better controlled. In fact she discontinued all of her medications other than her tamoxifen. She continues to deny any suicidal ideations. She does continue to feel anxious, however, about her breast cancer, and the possibility of recurrence.  As noted above, [name] continues on her tamoxifen at 20 mg daily. She has hot flashes at night. She's had no vaginal dryness or discharge. No abnormal vaginal bleeding. She has not had a menstrual cycle for approximately 18 months. We did discuss the fact that she could in fact get pregnant, and she knows to use barrier forms of birth control.  The only other complaint she has today is some pain in her mid back, just above her bra strap. She's noticed some tightening and pain there for approximately one month. It does seem to worsen with stress and decreases with activity, especially with yoga. This really sounds muscular in origin, but Kyrin is also having some  pain in the right rib cage which is new.  The patient still has her tissue expanders intact. Her reconstruction surgery has been put on hold for now secondary to lack of finances, and lack of insurance. She is followed by Dr. Odis Luster.  A detailed review of  systems is otherwise noncontributory as noted below.  Review of Systems: Constitutional:  no weight loss, fever, night sweats and feels well Eyes: negative QIO:[id number] Cardiovascular: no chest pain or dyspnea on exertion Respiratory: no cough, shortness of breath, or wheezing Neurological: negative Dermatological: negative Gastrointestinal: no abdominal pain, nausea, change in bowel habits, or black or bloody stools Genito-Urinary: no dysuria, trouble voiding, or hematuria Hematological and Lymphatic: negative Breast: negative Musculoskeletal: pain in mid back and right ribs or Remaining ROS negative.  FAMILY HISTORY:  The patient's father died at the age of 41 from drug overdose in the setting of ETOH abuse.  The patient's mother is alive at age 41.  She has a significant history of depression.  The patient has one sister, also with a history of depression.  GYNECOLOGIC HISTORY:  She is GX, P0.  Periods are regular.  Last about 4 days, of which 1 is heavy.    SOCIAL HISTORY:  She lives by herself.  She is not a church attender, and when asked who she would call in case of a problem, she really does not have anyone that she would call.  She does not feel her family is supportive, and she has no close friends.  She had a significant other, Gardiner Barefoot, but she has not seen him for the last 2-3 years.   Medications: I have reviewed the patient's current medications. Current Outpatient Prescriptions  Medication Sig Dispense Refill  . tamoxifen (NOLVADEX) 20 MG tablet Take 1 tablet (20 mg total) by mouth daily.  30 tablet  6    Allergies:  Allergies  Allergen Reactions  . Prochlorperazine Other (See Comments)    "Passsing out"  . Methadone Hcl Itching  . Tape Rash     Physical Exam: Filed Vitals:   10/25/11 1213  BP: 149/89  Pulse: 76  Temp: 97.7 F (36.5 C)   HEENT:  Sclerae anicteric, conjunctivae pink.  Oropharynx clear.  No mucositis or candidiasis.   Nodes:  No  cervical, supraclavicular, or axillary lymphadenopathy palpated.  Breast Exam:  Patient is status post bilateral mastectomies, with expanders intact. No suspicious nodularities or skin changes. No evidence of recurrence of disease.   Lungs:  Clear to auscultation bilaterally.  No crackles, rhonchi, or wheezes.   Heart:  Regular rate and rhythm.  No gallops, murmurs, or rubs. Abdomen:  Soft, nontender.  Positive bowel sounds.  No organomegaly or masses palpated.   Musculoskeletal:  No focal spinal tenderness to vigorous palpation. No localized tenderness to palpation of the right anterior or posterior ribs. Extremities:  Benign.  No peripheral edema or cyanosis.   Skin:  Benign.   Neuro:  Nonfocal.   Lab Results: Lab Results  Component Value Date   WBC 4.1 10/18/2011   HGB 13.2 10/18/2011   HCT 39.5 10/18/2011   MCV 85.8 10/18/2011   PLT 236 10/18/2011   NEUTROABS 2.1 10/18/2011     Chemistry      Component Value Date/Time   NA 140 10/18/2011 1320   K 4.0 10/18/2011 1320   CL 102 10/18/2011 1320   CO2 29 10/18/2011 1320   BUN 11 10/18/2011 1320   CREATININE 0.96 10/18/2011 1320      Component Value Date/Time   CALCIUM 10.0 10/18/2011 1320  ALKPHOS 46 10/18/2011 1320   AST 21 10/18/2011 1320   ALT 10 10/18/2011 1320   BILITOT 0.4 10/18/2011 1320        Radiological Studies:  No results found.   Assessment:  41 year old [place] woman,  (1)  status post bilateral mastectomies in September 2011 for a right sided T2 N0, stage II a, grade 3 invasive ductal carcinoma. Tumor was ER and PR positive, HER-2/neu negative, with an elevated proliferation fraction.   (2)  Status post adjuvant chemotherapy, consisting of 4 cycles of dose dense doxorubicin and cyclophosphamide followed by 4 weekly doses of paclitaxel. Paclitaxel was discontinued after 4 doses secondary to neuropathy which has resolved.   (3)  Patient was started on tamoxifen in March of 2012 on which she continues with good  tolerance. The plan is to continue for total of 5 years, followed by aromatase inhibitor for an additional 5 years.   (4)  The patient did not require radiation therapy.   (5)  She does carry a BRCA2 mutation of uncertain significance.   Plan:  Overall, Sharissa is actually doing remarkably well. She will continue to meet with Dr. Noe Gens regularly, which she is finding very helpful.  From a breast cancer standpoint, she will continue on tamoxifen at 20 mg daily. I am going to send her for a chest x-ray to evaluate the thoracic spine and right rib cage in the setting of increased pain. Assuming that the chest x-ray is negative, I plan on seeing her again in 2 months. We are following her very closely on a every 2 month basis, until her situation has stabilized.   This plan was reviewed with the patient, who voices understanding and agreement.  She knows to call with any changes or problems.    Aleida Crandell, PA-C 10/25/2011

## 2011-10-28 ENCOUNTER — Encounter: Payer: Self-pay | Admitting: Oncology

## 2011-10-28 NOTE — Progress Notes (Signed)
Patient approved for 100% EPP discount, for family of 1, income 14,640.00

## 2011-11-11 ENCOUNTER — Ambulatory Visit (INDEPENDENT_AMBULATORY_CARE_PROVIDER_SITE_OTHER): Payer: Self-pay | Admitting: Psychiatry

## 2011-11-11 DIAGNOSIS — F0630 Mood disorder due to known physiological condition, unspecified: Secondary | ICD-10-CM

## 2011-11-11 DIAGNOSIS — F331 Major depressive disorder, recurrent, moderate: Secondary | ICD-10-CM

## 2011-11-11 DIAGNOSIS — F1220 Cannabis dependence, uncomplicated: Secondary | ICD-10-CM

## 2011-11-11 NOTE — Progress Notes (Signed)
11-11-2011  Patient seen for individual psychotherapy.  Patient's mood depressed and hopeless as she struggles to find job and pay her bills.  Recommended she contact psychiatrist who prescribed paxil and ask her to write a letter explaining black box negative side effects that resulted in patient's criminal activities. Recommended patient look for efficiency apartment in [place] and network with friends to find job.  Next appointment 11-17-2011.

## 2011-11-17 ENCOUNTER — Ambulatory Visit: Payer: Self-pay | Admitting: Psychiatry

## 2011-11-23 ENCOUNTER — Encounter: Payer: Self-pay | Admitting: Oncology

## 2011-11-23 NOTE — Progress Notes (Signed)
Patient received one prescription from [facility] on 11/22/11 $20.15,her remaning balance [facility] $95.13and ALIGHT -0-.

## 2011-11-24 ENCOUNTER — Ambulatory Visit (INDEPENDENT_AMBULATORY_CARE_PROVIDER_SITE_OTHER): Payer: Self-pay | Admitting: Psychiatry

## 2011-11-24 DIAGNOSIS — F0630 Mood disorder due to known physiological condition, unspecified: Secondary | ICD-10-CM

## 2011-11-24 DIAGNOSIS — F331 Major depressive disorder, recurrent, moderate: Secondary | ICD-10-CM

## 2011-11-24 DIAGNOSIS — F1220 Cannabis dependence, uncomplicated: Secondary | ICD-10-CM

## 2011-11-25 NOTE — Progress Notes (Signed)
11-24-2011  Patient seen for individual psychotherapy.  She continues to struggle with depression related to her inability to find employment.  She has decided to open a women's center in [place] and will apply for a grant to obtain funds.  Gave patient behavioral techniques to get up in the morning and encouraged her to exercise daily to help with her mood issues.  Next appointment 12-01-2011.

## 2011-12-01 ENCOUNTER — Ambulatory Visit (INDEPENDENT_AMBULATORY_CARE_PROVIDER_SITE_OTHER): Payer: Self-pay | Admitting: Psychiatry

## 2011-12-01 DIAGNOSIS — F331 Major depressive disorder, recurrent, moderate: Secondary | ICD-10-CM

## 2011-12-01 DIAGNOSIS — F0630 Mood disorder due to known physiological condition, unspecified: Secondary | ICD-10-CM

## 2011-12-01 DIAGNOSIS — F1220 Cannabis dependence, uncomplicated: Secondary | ICD-10-CM

## 2011-12-02 NOTE — Progress Notes (Signed)
12-01-2011  Patient seen for individual psychotherapy.  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  Session focused on patient's history and family dynamics.  Next appointment 12-08-2011.

## 2011-12-08 ENCOUNTER — Ambulatory Visit (INDEPENDENT_AMBULATORY_CARE_PROVIDER_SITE_OTHER): Payer: Self-pay | Admitting: Psychiatry

## 2011-12-08 DIAGNOSIS — F0630 Mood disorder due to known physiological condition, unspecified: Secondary | ICD-10-CM

## 2011-12-08 DIAGNOSIS — F331 Major depressive disorder, recurrent, moderate: Secondary | ICD-10-CM

## 2011-12-08 DIAGNOSIS — F1220 Cannabis dependence, uncomplicated: Secondary | ICD-10-CM

## 2011-12-09 NOTE — Progress Notes (Signed)
12-08-2011  Patient seen for individual psychotherapy.  She c/o breast pain from expanders.  Referred patient to Su Hoff, nurse navigator, and to [employer] for volunteer support.  Patient scheduled for reconstruction surgery on 01-06-2012.  Session focused on coping skills to deal with psychosocial issues.  Next appointment 12-15-2011.

## 2011-12-14 ENCOUNTER — Other Ambulatory Visit (HOSPITAL_BASED_OUTPATIENT_CLINIC_OR_DEPARTMENT_OTHER): Payer: Self-pay | Admitting: Lab

## 2011-12-14 DIAGNOSIS — C50919 Malignant neoplasm of unspecified site of unspecified female breast: Secondary | ICD-10-CM

## 2011-12-14 DIAGNOSIS — C50911 Malignant neoplasm of unspecified site of right female breast: Secondary | ICD-10-CM

## 2011-12-14 LAB — CBC WITH DIFFERENTIAL/PLATELET
BASO%: 0.3 % (ref 0.0–2.0)
Eosinophils Absolute: 0 10*3/uL (ref 0.0–0.5)
LYMPH%: 30.3 % (ref 14.0–49.7)
MCHC: 34 g/dL (ref 31.5–36.0)
MONO#: 0.3 10*3/uL (ref 0.1–0.9)
MONO%: 6.6 % (ref 0.0–14.0)
NEUT#: 2.3 10*3/uL (ref 1.5–6.5)
Platelets: 203 10*3/uL (ref 145–400)
RBC: 4.41 10*6/uL (ref 3.70–5.45)
RDW: 14.1 % (ref 11.2–14.5)
WBC: 3.8 10*3/uL — ABNORMAL LOW (ref 3.9–10.3)

## 2011-12-14 LAB — CANCER ANTIGEN 27.29: CA 27.29: 15 U/mL (ref 0–39)

## 2011-12-14 LAB — COMPREHENSIVE METABOLIC PANEL
ALT: 9 U/L (ref 0–35)
AST: 17 U/L (ref 0–37)
CO2: 27 mEq/L (ref 19–32)
Creatinine, Ser: 0.86 mg/dL (ref 0.50–1.10)
Sodium: 139 mEq/L (ref 135–145)
Total Bilirubin: 0.2 mg/dL — ABNORMAL LOW (ref 0.3–1.2)
Total Protein: 7.1 g/dL (ref 6.0–8.3)

## 2011-12-15 ENCOUNTER — Ambulatory Visit: Payer: Self-pay | Admitting: Psychiatry

## 2011-12-21 ENCOUNTER — Telehealth: Payer: Self-pay | Admitting: *Deleted

## 2011-12-21 ENCOUNTER — Ambulatory Visit (HOSPITAL_BASED_OUTPATIENT_CLINIC_OR_DEPARTMENT_OTHER): Payer: Self-pay | Admitting: Physician Assistant

## 2011-12-21 ENCOUNTER — Encounter: Payer: Self-pay | Admitting: Physician Assistant

## 2011-12-21 VITALS — BP 127/83 | HR 70 | Temp 98.0°F | Ht 64.5 in | Wt 141.5 lb

## 2011-12-21 DIAGNOSIS — N898 Other specified noninflammatory disorders of vagina: Secondary | ICD-10-CM

## 2011-12-21 DIAGNOSIS — M79609 Pain in unspecified limb: Secondary | ICD-10-CM

## 2011-12-21 DIAGNOSIS — Z170 Estrogen receptor positive status [ER+]: Secondary | ICD-10-CM

## 2011-12-21 DIAGNOSIS — C50319 Malignant neoplasm of lower-inner quadrant of unspecified female breast: Secondary | ICD-10-CM

## 2011-12-21 DIAGNOSIS — C50911 Malignant neoplasm of unspecified site of right female breast: Secondary | ICD-10-CM

## 2011-12-21 DIAGNOSIS — C50919 Malignant neoplasm of unspecified site of unspecified female breast: Secondary | ICD-10-CM

## 2011-12-21 MED ORDER — TAMOXIFEN CITRATE 20 MG PO TABS: 20.0000 mg | ORAL_TABLET | Freq: Every day | ORAL | Status: DC

## 2011-12-21 NOTE — Progress Notes (Signed)
ID: Penny Kim   DOB: 1971/08/16  MR#: [id number]  CSN#:[id number]  HISTORY OF PRESENT ILLNESS: Penny Kim is a 41 year old [place] woman referred by Dr. Dwain Sarna for evaluation and treatment of her right breast carcinoma. Patient had her first mammogram, a screening mammogram, at [facility] on 04/23/2010, showing diffuse calcifications in the lower inner quadrant of the right breast, measuring up to 5.6 cm. She was recalled for additional studies and on 04/27/2010 the breast was found to be dense, limiting mammographic sensitivity. It was again able to demonstrate calcifications throughout the lower inner quadrant of the right breast. 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  The patient was referred to Dr. Dwain Sarna. Bilateral breast MRIs were obtained 05/03/2010, showing in the right breast an area of enhancement totaling 7.7 cm. Most of this was nodular and non-masslike, but in addition, in the anterior aspect, there was a rounded area of masslike enhancement measuring 1.7 cm and suspicious for invasive ductal carcinoma. The left breast was unremarkable, no suspicious lymph nodes noted.  Additional biopsy was obtained for the more masslike area on 05/05/2010, ([id number]) showing a grade 2 invasive ductal carcinoma, ER +78%, PR +82%. 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 Proliferation marker was elevated at 85%.  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 0 of 4 lymph nodes were involved and there were ample margins. Tissue expanders were placed.  The patient was treated with adjuvant chemotherapy and received 4 dose dense cycles of doxorubicin and cyclophosphamide. 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 Started on tamoxifen in March of 2012, continues now at 20 mg daily. The plan is to continue tamoxifen for 5 years, then switch to an aromatase inhibitor for an additional 5 years.  Patient carries a BRCA2 mutation of unknown significance. The issue of whether she should undergo bilateral salpingo-oophorectomy will continue to be addressed, the patient having not had a menstrual cycle for almost one year now.    INTERVAL HISTORY: Penny Kim returns today for routine followup of her right breast carcinoma. She appears to be doing extremely well. She is much more upbeat on presentation today. She continues to meet with Penny Kim a regular basis, and although Penny Kim tells me she still feels depressed at times, she also knows how to "pick herself up". She denies any suicidal ideations. She is still not working which is a little stressful. She is trying to eat better, and is exercising on a regular basis. Overall she "feels so much healthier". She also continues on her tamoxifen daily with good tolerance   REVIEW OF SYSTEMS: Penny Kim's energy level is good. She has had no recent illnesses and denies fevers or chills. She does have some hot flashes. She had intercourse on one occasion and found it to be very painful. She does have vaginal dryness. She has still not a menstrual cycle now for one year. We did discuss the fact that she could in fact get pregnant, and she understands the need for barrier forms of birth control with intercourse.  Penny Kim is eating well and denies any nausea or change in bowel habits. Her skin is still a little darker, as are her nail beds. She denies any additional skin changes or rashes. She does note  some occasional discomfort in the right upper extremity and tells me the arm feels "tight" at times with activity. No chest pain. No increased shortness of breath. She still has some pain associated with her tissue expanders, and is scheduled for her final  reconstructive surgery later this month with Dr. Odis Kim.  A detailed review of systems is otherwise noncontributory.  PAST MEDICAL HISTORY: Past Medical History  Diagnosis Date  . Depression   . Anxiety   . Hypertension   . Breast cancer, right breast 08/24/2011  . Cancer 2011    diagnosed by routine mammogram. 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 s/p bilateral mastectomy and  chemotherapy. Currently on Tamoxifen. Expanders still in place. Reconstructive surgery not yet done due to lack of insurance.     PAST SURGICAL HISTORY: Past Surgical History  Procedure Date  . Breast surgery December 2011    bilaterall mastectomy for invasive ductal carcinoma in the right breast, grade 3,     FAMILY HISTORY Family History  Problem Relation Age of Onset  . Hypertension Mother   . Cancer Sister   . Cancer Maternal Grandmother   . Hypertension Maternal Grandmother   . Diabetes Maternal Grandmother   . Cancer Maternal Grandfather   . Hypertension Maternal Grandfather   . Diabetes Paternal Grandmother    GYNECOLOGIC HISTORY: She is GX, P0.    SOCIAL HISTORY: She lives by herself. She is not a church attender, and when asked who she would call in case of a problem, she really does not have anyone that she would call. She does not feel her family is supportive, and she has no close friends.   ADVANCED DIRECTIVES:  HEALTH MAINTENANCE: History  Substance Use Topics  . Smoking status: Former Games developer  . Smokeless tobacco: Never Used  . Alcohol Use: 1.2 oz/week    2 Glasses of wine per week     Colonoscopy:  PAP:  Bone density:  Lipid panel:  Allergies  Allergen Reactions  . Prochlorperazine Other (See Comments)    "Passsing out"  . Methadone Hcl Itching  . Tape Rash    Current Outpatient Prescriptions  Medication Sig Dispense Refill  . tamoxifen (NOLVADEX) 20 MG tablet Take 1 tablet (20 mg total) by mouth daily.  30  tablet  6    OBJECTIVE: Filed Vitals:   12/21/11 1115  BP: 127/83  Pulse: 70  Temp: 98 F (36.7 C)     Body mass index is 23.91 kg/(m^2).    ECOG FS: 0  Physical Exam: HEENT:  Sclerae anicteric, conjunctivae pink.  Oropharynx clear.  No mucositis or candidiasis.   Nodes:  No cervical, supraclavicular, or axillary lymphadenopathy palpated.  Breast Exam: Tissue expanders are intact bilaterally. No suspicious nodularity or skin changes. No evidence of local recurrence on the right.  Lungs:  Clear to auscultation bilaterally.  No crackles, rhonchi, or wheezes.   Heart:  Regular rate and rhythm.   Abdomen:  Soft, nontender.  Positive bowel sounds.  No organomegaly or masses palpated.   Musculoskeletal:  No focal spinal tenderness to palpation.  Extremities:  Benign.  No peripheral edema or cyanosis.   Skin:  Benign with the exception of mild hyperpigmentation on the upper extremities and the nailbeds.    Neuro:  Nonfocal.    LAB RESULTS: Lab Results  Component Value Date   WBC 3.8* 12/14/2011   NEUTROABS 2.3 12/14/2011   HGB 12.9 12/14/2011   HCT  38.1 12/14/2011   MCV 86.4 12/14/2011   PLT 203 12/14/2011      Chemistry      Component Value Date/Time   NA 139 12/14/2011 1110   K 3.8 12/14/2011 1110   CL 104 12/14/2011 1110   CO2 27 12/14/2011 1110   BUN 11 12/14/2011 1110   CREATININE 0.86 12/14/2011 1110      Component Value Date/Time   CALCIUM 9.6 12/14/2011 1110   ALKPHOS 48 12/14/2011 1110   AST 17 12/14/2011 1110   ALT 9 12/14/2011 1110   BILITOT 0.2* 12/14/2011 1110       Lab Results  Component Value Date   LABCA2 15 12/14/2011    STUDIES:  10/25/2011 *RADIOLOGY REPORT*  Clinical Data: Left chest pain. History of breast cancer. Status  post bilateral mastectomy.  CHEST - 2 VIEW  Comparison: PA and lateral chest 02/04/2011 and 06/22/2010. CT  chest 07/17/2010.  Findings: Bilateral tissue expanders are in place. Tissue expander  on the left is new since the prior  study. The lungs are clear.  Heart size is normal. There is no pneumothorax or pleural  effusion. No focal bony abnormality.  IMPRESSION:  No acute finding. Negative for metastatic disease.  Original Report Authenticated By: Bernadene Bell. D'ALESSIO, M.D.   ASSESSMENT: 41 year old [place] woman,  (1) status post bilateral mastectomies in September 2011 for a right sided T2 N0, stage II a, grade 3 invasive ductal carcinoma. Tumor was ER and PR positive, HER-2/neu negative, with an elevated proliferation fraction.  (2) Status post adjuvant chemotherapy, consisting of 4 cycles of dose dense doxorubicin and cyclophosphamide followed by 4 weekly doses of paclitaxel. Paclitaxel was discontinued after 4 doses secondary to neuropathy which has resolved.  (3) Patient was started on tamoxifen in March of 2012 on which she continues with good tolerance. The plan is to continue for total of 5 years, followed by aromatase inhibitor for an additional 5 years.  (4) The patient did not require radiation therapy.  (5) She does carry a BRCA2 mutation of uncertain significance.    PLAN: With regards to her breast cancer, Penny Kim continues to do well, and there is no clinical evidence of disease recurrence at this time. She will continue on her tamoxifen which I have refilled today. The plan is to complete a total of 5 years, followed by aromatase inhibitor for an additional 5 years. I have recommended vaginal moisturizer such as Replens in addition 12 lubricants such as Astro-glide with intercourse. We did discuss thoroughly the fact that she needs to utilize barrier forms of birth control.  I am referring Penny Kim to the lymphedema clinic to assess for lymphedema, and to assess her range of motion in the right upper extremity.   We will continue to follow Penny Kim closely and will see her every 2 months until her situation has stabilized a little more. She'll continue to be seen by Dr. Noe Kim regularly. I will see her in  2 months, and she'll see Dr. Darnelle Kim in 4 months. Perhaps at that time, she continues to do well, we will extend her visits out a little further.  Zhaniya voices understanding and agreement with this plan, and will call with any changes or problems.   Penny Kim    12/21/2011

## 2011-12-21 NOTE — Telephone Encounter (Signed)
called physical therapy on [address] to inform that the order is in epic pn 12-21-2011

## 2011-12-21 NOTE — Telephone Encounter (Signed)
gave patient appointment for 01-2012 with amy berry 04-2012 with dr.magrinat printed out  calendar and gave to the patient called over the lymphedema clinic orders in epic

## 2011-12-22 ENCOUNTER — Ambulatory Visit (INDEPENDENT_AMBULATORY_CARE_PROVIDER_SITE_OTHER): Payer: Self-pay | Admitting: Psychiatry

## 2011-12-22 DIAGNOSIS — F1220 Cannabis dependence, uncomplicated: Secondary | ICD-10-CM

## 2011-12-22 DIAGNOSIS — F331 Major depressive disorder, recurrent, moderate: Secondary | ICD-10-CM

## 2011-12-22 DIAGNOSIS — F0630 Mood disorder due to known physiological condition, unspecified: Secondary | ICD-10-CM

## 2011-12-23 NOTE — Progress Notes (Signed)
12-22-2011  Patient seen for individual psychotherapy.  Session focused on patient's issues about finances, employment and recreation.  Encouraged her to visualize her goals.  Recommended she use three role models to help her overcome low self esteem by utilizing them as templates for her behavior.  Next appointment 12-29-2011.

## 2011-12-27 ENCOUNTER — Encounter (HOSPITAL_COMMUNITY): Payer: Self-pay

## 2011-12-27 ENCOUNTER — Emergency Department (INDEPENDENT_AMBULATORY_CARE_PROVIDER_SITE_OTHER)
Admission: EM | Admit: 2011-12-27 | Discharge: 2011-12-27 | Disposition: A | Discharge status: Home / Self Care | Payer: Self-pay | Source: Home / Self Care | Attending: Family Medicine | Admitting: Family Medicine

## 2011-12-27 DIAGNOSIS — N760 Acute vaginitis: Secondary | ICD-10-CM

## 2011-12-27 LAB — POCT URINALYSIS DIP (DEVICE)
Glucose, UA: NEGATIVE mg/dL
Nitrite: NEGATIVE
Urobilinogen, UA: 0.2 mg/dL (ref 0.0–1.0)

## 2011-12-27 LAB — WET PREP, GENITAL: Yeast Wet Prep HPF POC: NONE SEEN

## 2011-12-27 LAB — POCT PREGNANCY, URINE: Preg Test, Ur: NEGATIVE

## 2011-12-27 MED ORDER — FLUCONAZOLE 150 MG PO TABS: ORAL_TABLET | ORAL | Status: DC

## 2011-12-27 MED ORDER — METRONIDAZOLE 500 MG PO TABS: ORAL_TABLET | ORAL | Status: DC

## 2011-12-27 NOTE — ED Notes (Signed)
C/o vaginal irritation for 2 weeks, states she noticed an odor this morning.  Denies discharge, states sx may be due to tamoxifen but states she doesn't trust her boyfriend.

## 2011-12-27 NOTE — Discharge Instructions (Signed)
You your prescribed medications will treat empirically for yeast and bacterial vaginal infections. We will call you if any of the pending results if abnormal. I recommend to use condoms every time for sexually transmitted disease prevention. If this is a recurrent problem can try over-the-counter refresh and follow label instructions. Also including yogurt injured died can help prevent seen some vaginal infections and. Return if worsening symptoms despite following treatment. We did not perform a pap smear today you need to establish care with a primary care provider for cancer screening tests.

## 2011-12-28 LAB — HIV ANTIBODY (ROUTINE TESTING W REFLEX): HIV: NONREACTIVE

## 2011-12-28 LAB — GC/CHLAMYDIA PROBE AMP, GENITAL: Chlamydia, DNA Probe: NEGATIVE
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
## 2011-12-29 ENCOUNTER — Ambulatory Visit (INDEPENDENT_AMBULATORY_CARE_PROVIDER_SITE_OTHER): Payer: Self-pay | Admitting: Psychiatry

## 2011-12-29 DIAGNOSIS — F331 Major depressive disorder, recurrent, moderate: Secondary | ICD-10-CM

## 2011-12-29 DIAGNOSIS — F0630 Mood disorder due to known physiological condition, unspecified: Secondary | ICD-10-CM

## 2011-12-30 ENCOUNTER — Encounter: Payer: Self-pay | Admitting: Oncology

## 2011-12-30 NOTE — Progress Notes (Signed)
Patient received six prescriptions from [facility] pharmacy on 12/28/11 $91.00,her remaninig balance [facility] $4.13 and [facility] -0-.

## 2011-12-30 NOTE — Progress Notes (Signed)
12-29-2011  Patient seen for individual psychotherapy.  Session focused on patient's upcoming reconstructive breast surgery.  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  Patient's mood still apprehensive.  Will make referral to [facility] for support for patient prior to surgery. Patient will call for next appointment after surgery.

## 2011-12-31 ENCOUNTER — Encounter (HOSPITAL_BASED_OUTPATIENT_CLINIC_OR_DEPARTMENT_OTHER): Payer: Self-pay | Admitting: *Deleted

## 2012-01-05 ENCOUNTER — Ambulatory Visit: Payer: Self-pay | Admitting: Psychiatry

## 2012-01-06 ENCOUNTER — Encounter (HOSPITAL_BASED_OUTPATIENT_CLINIC_OR_DEPARTMENT_OTHER): Payer: Self-pay | Admitting: *Deleted

## 2012-01-06 ENCOUNTER — Ambulatory Visit (HOSPITAL_BASED_OUTPATIENT_CLINIC_OR_DEPARTMENT_OTHER): Payer: Self-pay | Admitting: Anesthesiology

## 2012-01-06 ENCOUNTER — Encounter (HOSPITAL_BASED_OUTPATIENT_CLINIC_OR_DEPARTMENT_OTHER): Payer: Self-pay | Admitting: Certified Registered"

## 2012-01-06 ENCOUNTER — Encounter (HOSPITAL_BASED_OUTPATIENT_CLINIC_OR_DEPARTMENT_OTHER): Payer: Self-pay | Admitting: Anesthesiology

## 2012-01-06 ENCOUNTER — Ambulatory Visit (HOSPITAL_BASED_OUTPATIENT_CLINIC_OR_DEPARTMENT_OTHER)
Admission: RE | Admit: 2012-01-06 | Discharge: 2012-01-06 | Disposition: A | Discharge status: Home / Self Care | Payer: Self-pay | Source: Ambulatory Visit | Attending: Plastic Surgery | Admitting: Plastic Surgery

## 2012-01-06 ENCOUNTER — Encounter (HOSPITAL_BASED_OUTPATIENT_CLINIC_OR_DEPARTMENT_OTHER)
Admission: RE | Disposition: A | Discharge status: Home / Self Care | Payer: Self-pay | Source: Ambulatory Visit | Attending: Plastic Surgery

## 2012-01-06 DIAGNOSIS — Z9010 Acquired absence of unspecified breast and nipple: Secondary | ICD-10-CM | POA: Insufficient documentation

## 2012-01-06 DIAGNOSIS — Z853 Personal history of malignant neoplasm of breast: Secondary | ICD-10-CM | POA: Insufficient documentation

## 2012-01-06 DIAGNOSIS — Z421 Encounter for breast reconstruction following mastectomy: Secondary | ICD-10-CM | POA: Insufficient documentation

## 2012-01-06 HISTORY — PX: BREAST RECONSTRUCTION: SHX9

## 2012-01-06 SURGERY — RECONSTRUCTION, BREAST
Anesthesia: General | Site: Breast | Laterality: Bilateral | Wound class: Clean

## 2012-01-06 MED ORDER — SODIUM CHLORIDE 0.9 % IR SOLN
Status: DC | PRN
  Administered 2012-01-06: 09:00:00

## 2012-01-06 MED ORDER — PROPOFOL 10 MG/ML IV EMUL
INTRAVENOUS | Status: DC | PRN
  Administered 2012-01-06: 130 mg via INTRAVENOUS

## 2012-01-06 MED ORDER — NEOSTIGMINE METHYLSULFATE 1 MG/ML IJ SOLN
INTRAMUSCULAR | Status: DC | PRN
  Administered 2012-01-06: 2.5 mg via INTRAVENOUS

## 2012-01-06 MED ORDER — MIDAZOLAM HCL 2 MG/2ML IJ SOLN
0.5000 mg | Freq: Once | INTRAMUSCULAR | Status: AC | PRN
  Administered 2012-01-06: 1 mg via INTRAVENOUS

## 2012-01-06 MED ORDER — DEXAMETHASONE SODIUM PHOSPHATE 4 MG/ML IJ SOLN
INTRAMUSCULAR | Status: DC | PRN
  Administered 2012-01-06: 8 mg via INTRAVENOUS

## 2012-01-06 MED ORDER — MIDAZOLAM HCL 5 MG/5ML IJ SOLN
INTRAMUSCULAR | Status: DC | PRN
  Administered 2012-01-06: 2 mg via INTRAVENOUS

## 2012-01-06 MED ORDER — ONDANSETRON HCL 4 MG/2ML IJ SOLN
INTRAMUSCULAR | Status: DC | PRN
  Administered 2012-01-06: 4 mg via INTRAVENOUS

## 2012-01-06 MED ORDER — SODIUM CHLORIDE 0.9 % IR SOLN
Status: DC | PRN
  Administered 2012-01-06: 750 mL

## 2012-01-06 MED ORDER — ACETAMINOPHEN 10 MG/ML IV SOLN
INTRAVENOUS | Status: DC | PRN
  Administered 2012-01-06: 1000 mg via INTRAVENOUS

## 2012-01-06 MED ORDER — CEFAZOLIN SODIUM 1-5 GM-% IV SOLN
INTRAVENOUS | Status: DC | PRN
  Administered 2012-01-06: 1 g via INTRAVENOUS

## 2012-01-06 MED ORDER — HYDROMORPHONE HCL PF 1 MG/ML IJ SOLN
0.2500 mg | INTRAMUSCULAR | Status: DC | PRN
  Administered 2012-01-06 (×2): 0.5 mg via INTRAVENOUS

## 2012-01-06 MED ORDER — ROCURONIUM BROMIDE 100 MG/10ML IV SOLN
INTRAVENOUS | Status: DC | PRN
  Administered 2012-01-06: 30 mg via INTRAVENOUS

## 2012-01-06 MED ORDER — LIDOCAINE HCL (CARDIAC) 20 MG/ML IV SOLN
INTRAVENOUS | Status: DC | PRN
  Administered 2012-01-06: 60 mg via INTRAVENOUS

## 2012-01-06 MED ORDER — DROPERIDOL 2.5 MG/ML IJ SOLN
INTRAMUSCULAR | Status: DC | PRN
  Administered 2012-01-06: 0.625 mg via INTRAVENOUS

## 2012-01-06 MED ORDER — CEFAZOLIN SODIUM 1-5 GM-% IV SOLN: 1.0000 g | Freq: Once | INTRAVENOUS | Status: DC

## 2012-01-06 MED ORDER — GLYCOPYRROLATE 0.2 MG/ML IJ SOLN
INTRAMUSCULAR | Status: DC | PRN
  Administered 2012-01-06: 0.6 mg via INTRAVENOUS

## 2012-01-06 MED ORDER — FENTANYL CITRATE 0.05 MG/ML IJ SOLN
INTRAMUSCULAR | Status: DC | PRN
  Administered 2012-01-06: 25 ug via INTRAVENOUS
  Administered 2012-01-06: 100 ug via INTRAVENOUS

## 2012-01-06 MED ORDER — SUCCINYLCHOLINE CHLORIDE 20 MG/ML IJ SOLN
INTRAMUSCULAR | Status: DC | PRN
  Administered 2012-01-06: 100 mg via INTRAVENOUS

## 2012-01-06 MED ORDER — LACTATED RINGERS IV SOLN
INTRAVENOUS | Status: DC
  Administered 2012-01-06 (×2): via INTRAVENOUS

## 2012-01-06 SURGICAL SUPPLY — 81 items
BAG DECANTER FOR FLEXI CONT (MISCELLANEOUS) IMPLANT
BANDAGE ELASTIC 6 VELCRO ST LF (GAUZE/BANDAGES/DRESSINGS) ×2 IMPLANT
BINDER BREAST LRG (GAUZE/BANDAGES/DRESSINGS) ×4 IMPLANT
BINDER BREAST MEDIUM (GAUZE/BANDAGES/DRESSINGS) IMPLANT
BINDER BREAST XLRG (GAUZE/BANDAGES/DRESSINGS) IMPLANT
BINDER BREAST XXLRG (GAUZE/BANDAGES/DRESSINGS) IMPLANT
BLADE HEX COATED 2.75 (ELECTRODE) IMPLANT
BLADE SURG 10 STRL SS (BLADE) IMPLANT
BLADE SURG 15 STRL LF DISP TIS (BLADE) ×2 IMPLANT
BLADE SURG 15 STRL SS (BLADE) ×2
CANISTER SUCTION 1200CC (MISCELLANEOUS) ×4 IMPLANT
CHLORAPREP W/TINT 26ML (MISCELLANEOUS) ×2 IMPLANT
CLEANER CAUTERY TIP 5X5 PAD (MISCELLANEOUS) IMPLANT
CLOTH BEACON ORANGE TIMEOUT ST (SAFETY) ×2 IMPLANT
COVER MAYO STAND STRL (DRAPES) ×2 IMPLANT
COVER TABLE BACK 60X90 (DRAPES) ×2 IMPLANT
DECANTER SPIKE VIAL GLASS SM (MISCELLANEOUS) IMPLANT
DERMABOND ADVANCED (GAUZE/BANDAGES/DRESSINGS) ×2
DERMABOND ADVANCED .7 DNX12 (GAUZE/BANDAGES/DRESSINGS) ×2 IMPLANT
DRAIN CHANNEL 10M FLAT 3/4 FLT (DRAIN) IMPLANT
DRAIN CHANNEL 19F RND (DRAIN) IMPLANT
DRAPE LAPAROSCOPIC ABDOMINAL (DRAPES) ×2 IMPLANT
DRAPE SURG 17X23 STRL (DRAPES) ×4 IMPLANT
DRSG PAD ABDOMINAL 8X10 ST (GAUZE/BANDAGES/DRESSINGS) ×4 IMPLANT
ELECT BLADE 4.0 EZ CLEAN MEGAD (MISCELLANEOUS) ×2
ELECT BLADE 6.5 .24CM SHAFT (ELECTRODE) IMPLANT
ELECT REM PT RETURN 9FT ADLT (ELECTROSURGICAL) ×2
ELECTRODE BLDE 4.0 EZ CLN MEGD (MISCELLANEOUS) ×1 IMPLANT
ELECTRODE REM PT RTRN 9FT ADLT (ELECTROSURGICAL) ×1 IMPLANT
EVACUATOR SILICONE 100CC (DRAIN) IMPLANT
FILTER 7/8 IN (FILTER) ×2 IMPLANT
GAUZE SPONGE 4X4 12PLY STRL LF (GAUZE/BANDAGES/DRESSINGS) IMPLANT
GAUZE XEROFORM 1X8 LF (GAUZE/BANDAGES/DRESSINGS) IMPLANT
GAUZE XEROFORM 5X9 LF (GAUZE/BANDAGES/DRESSINGS) IMPLANT
GLOVE BIO SURGEON STRL SZ7.5 (GLOVE) ×2 IMPLANT
GLOVE BIO SURGEON STRL SZ8 (GLOVE) IMPLANT
GLOVE BIOGEL PI IND STRL 8 (GLOVE) ×1 IMPLANT
GLOVE BIOGEL PI INDICATOR 8 (GLOVE) ×1
GLOVE SKINSENSE NS SZ7.0 (GLOVE) ×1
GLOVE SKINSENSE STRL SZ7.0 (GLOVE) ×1 IMPLANT
GOWN PREVENTION PLUS XLARGE (GOWN DISPOSABLE) ×2 IMPLANT
GOWN PREVENTION PLUS XXLARGE (GOWN DISPOSABLE) ×4 IMPLANT
IMPL BREAST HP 630CC (Breast) ×2 IMPLANT
IMPLANT BREAST HP 630CC (Breast) ×4 IMPLANT
IV NS 500ML (IV SOLUTION)
IV NS 500ML BAXH (IV SOLUTION) IMPLANT
KIT FILL MCGHAN 30CC (MISCELLANEOUS) ×2 IMPLANT
NEEDLE 27GAX1X1/2 (NEEDLE) IMPLANT
NS IRRIG 1000ML POUR BTL (IV SOLUTION) ×2 IMPLANT
PACK BASIN DAY SURGERY FS (CUSTOM PROCEDURE TRAY) ×2 IMPLANT
PAD CLEANER CAUTERY TIP 5X5 (MISCELLANEOUS)
PAD EYE OVAL STERILE LF (GAUZE/BANDAGES/DRESSINGS) IMPLANT
PIN SAFETY STERILE (MISCELLANEOUS) IMPLANT
SLEEVE SCD COMPRESS KNEE MED (MISCELLANEOUS) ×2 IMPLANT
SPONGE GAUZE 4X4 12PLY (GAUZE/BANDAGES/DRESSINGS) ×2 IMPLANT
SPONGE LAP 18X18 X RAY DECT (DISPOSABLE) ×4 IMPLANT
STRIP CLOSURE SKIN 1/2X4 (GAUZE/BANDAGES/DRESSINGS) IMPLANT
SUCTION FRAZIER TIP 10 FR DISP (SUCTIONS) IMPLANT
SUT CHROMIC 4 0 P 3 18 (SUTURE) IMPLANT
SUT MNCRL AB 3-0 PS2 18 (SUTURE) ×4 IMPLANT
SUT MNCRL AB 4-0 PS2 18 (SUTURE) ×4 IMPLANT
SUT MON AB 2-0 CT1 36 (SUTURE) IMPLANT
SUT PDS AB 0 CT 36 (SUTURE) IMPLANT
SUT PDS AB 2-0 CT2 27 (SUTURE) IMPLANT
SUT PROLENE 3 0 PS 2 (SUTURE) IMPLANT
SUT PROLENE 4 0 PS 2 18 (SUTURE) IMPLANT
SUT SILK 3 0 SH 30 (SUTURE) IMPLANT
SUT VIC AB 2-0 SH 27 (SUTURE)
SUT VIC AB 2-0 SH 27XBRD (SUTURE) IMPLANT
SUT VIC AB 3-0 FS2 27 (SUTURE) IMPLANT
SUT VICRYL 3-0 CR8 SH (SUTURE) IMPLANT
SUT VICRYL 4-0 PS2 18IN ABS (SUTURE) IMPLANT
SYR 50ML LL SCALE MARK (SYRINGE) IMPLANT
SYR BULB IRRIGATION 50ML (SYRINGE) ×4 IMPLANT
SYR CONTROL 10ML LL (SYRINGE) IMPLANT
TOWEL OR 17X24 6PK STRL BLUE (TOWEL DISPOSABLE) ×4 IMPLANT
TUBE CONNECTING 20X1/4 (TUBING) ×2 IMPLANT
UNDERPAD 30X30 INCONTINENT (UNDERPADS AND DIAPERS) ×4 IMPLANT
VAC PENCILS W/TUBING CLEAR (MISCELLANEOUS) ×2 IMPLANT
WATER STERILE IRR 1000ML POUR (IV SOLUTION) IMPLANT
YANKAUER SUCT BULB TIP NO VENT (SUCTIONS) ×2 IMPLANT

## 2012-01-06 NOTE — Anesthesia Preprocedure Evaluation (Signed)
Anesthesia Evaluation  Patient identified by MRN, date of birth, ID band Patient awake    Reviewed: Allergy & Precautions, H&P , NPO status , Patient's Chart, lab work & pertinent test results  Airway Mallampati: II TM Distance: >3 FB Neck ROM: Full    Dental No notable dental hx. (+) Teeth Intact and Dental Advisory Given   Pulmonary neg pulmonary ROS,    Pulmonary exam normal       Cardiovascular negative cardio ROS      Neuro/Psych negative neurological ROS  negative psych ROS   GI/Hepatic negative GI ROS, Neg liver ROS,   Endo/Other  negative endocrine ROS  Renal/GU negative Renal ROS  negative genitourinary   Musculoskeletal   Abdominal   Peds  Hematology negative hematology ROS (+)   Anesthesia Other Findings   Reproductive/Obstetrics negative OB ROS                           Anesthesia Physical Anesthesia Plan  ASA: II  Anesthesia Plan: General   Post-op Pain Management:    Induction: Intravenous  Airway Management Planned: LMA  Additional Equipment:   Intra-op Plan:   Post-operative Plan: Extubation in OR  Informed Consent: I have reviewed the patients History and Physical, chart, labs and discussed the procedure including the risks, benefits and alternatives for the proposed anesthesia with the patient or authorized representative who has indicated his/her understanding and acceptance.   Dental advisory given  Plan Discussed with: CRNA  Anesthesia Plan Comments:         Anesthesia Quick Evaluation

## 2012-01-06 NOTE — Discharge Instructions (Addendum)
No lifting for 6 weeks No vigorous activity for 6 weeks (including outdoor walks) No driving for 4 weeks OK to walk up stairs slowly Stay propped up Use incentive spirometer at home every hour while awake May shower Saturday. Pat dry gently. Take an over-the-counter Probiotic while on antibiotics Take an over-the-counter stool softener (such as Colace) while on pain medication   Post Anesthesia Home Care Instructions  Activity: Get plenty of rest for the remainder of the day. A responsible adult should stay with you for 24 hours following the procedure.  For the next 24 hours, DO NOT: -Drive a car -Advertising copywriter -Drink alcoholic beverages -Take any medication unless instructed by your physician -Make any legal decisions or sign important papers.  Meals: Start with liquid foods such as gelatin or soup. Progress to regular foods as tolerated. Avoid greasy, spicy, heavy foods. If nausea and/or vomiting occur, drink only clear liquids until the nausea and/or vomiting subsides. Call your physician if vomiting continues.  Special Instructions/Symptoms: Your throat may feel dry or sore from the anesthesia or the breathing tube placed in your throat during surgery. If this causes discomfort, gargle with warm salt water. The discomfort should disappear within 24 hours.  Call your surgeon if you experience:   1.  Fever over 101.0. 2.  Inability to urinate. 3.  Nausea and/or vomiting. 4.  Extreme swelling or bruising at the surgical site. 5.  Continued bleeding from the incision. 6.  Increased pain, redness or drainage from the incision. 7.  Problems related to your pain medication.

## 2012-01-06 NOTE — Brief Op Note (Signed)
01/06/2012  9:13 AM  PATIENT:  Penny Kim  41 y.o. female  PRE-OPERATIVE DIAGNOSIS:  breast cancer  POST-OPERATIVE DIAGNOSIS:  Breast Cancer  PROCEDURE:  Procedure(s) (LRB): BREAST RECONSTRUCTION (Bilateral) removal of tissue expanders and placement of saline implants  SURGEON:  Surgeon(s) and Role:    * Etter Sjogren, MD - Primary  PHYSICIAN ASSISTANT:   ASSISTANTS: none   ANESTHESIA:   general  EBL:  Total I/O In: 800 [I.V.:800] Out: -   BLOOD ADMINISTERED:none  DRAINS: none   LOCAL MEDICATIONS USED:  NONE  SPECIMEN:  No Specimen  DISPOSITION OF SPECIMEN:  N/A  COUNTS:  YES  TOURNIQUET:  * No tourniquets in log *  DICTATION: .Other Dictation: Dictation Number [id number]  PLAN OF CARE: Discharge to home after PACU  PATIENT DISPOSITION:  PACU - hemodynamically stable.   Delay start of Pharmacological VTE agent (>24hrs) due to surgical blood loss or risk of bleeding: yes

## 2012-01-06 NOTE — Anesthesia Postprocedure Evaluation (Signed)
  Anesthesia Post-op Note  Patient: Penny Kim  Procedure(s) Performed: Procedure(s) (LRB): BREAST RECONSTRUCTION (Bilateral)  Patient Location: PACU  Anesthesia Type: General  Level of Consciousness: awake  Airway and Oxygen Therapy: Patient Spontanous Breathing and Patient connected to face mask oxygen  Post-op Pain: moderate  Post-op Assessment: Post-op Vital signs reviewed, Patient's Cardiovascular Status Stable, Respiratory Function Stable and Patent Airway  Post-op Vital Signs: Reviewed and stable  Complications: No apparent anesthesia complications

## 2012-01-06 NOTE — H&P (Signed)
I have re-evaluated and re-examined patient and there are no changes. 

## 2012-01-06 NOTE — Anesthesia Procedure Notes (Signed)
Procedure Name: Intubation Date/Time: 01/06/2012 7:52 AM Performed by: Verlan Friends Pre-anesthesia Checklist: Patient identified, Emergency Drugs available, Suction available, Patient being monitored and Timeout performed Patient Re-evaluated:Patient Re-evaluated prior to inductionOxygen Delivery Method: Circle System Utilized Preoxygenation: Pre-oxygenation with 100% oxygen Intubation Type: IV induction Ventilation: Mask ventilation without difficulty Laryngoscope Size: Miller and 3 Grade View: Grade II Tube type: Oral Number of attempts: 2 (cords obscured by sputum, suctioned, intubated) Airway Equipment and Method: stylet and oral airway Placement Confirmation: ETT inserted through vocal cords under direct vision,  positive ETCO2 and breath sounds checked- equal and bilateral Tube secured with: Tape Dental Injury: Teeth and Oropharynx as per pre-operative assessment

## 2012-01-06 NOTE — Transfer of Care (Signed)
Immediate Anesthesia Transfer of Care Note  Patient: Penny Kim  Procedure(s) Performed: Procedure(s) (LRB): BREAST RECONSTRUCTION (Bilateral)  Patient Location: PACU  Anesthesia Type: General  Level of Consciousness: awake, alert , oriented and patient cooperative  Airway & Oxygen Therapy: Patient Spontanous Breathing and Patient connected to face mask oxygen  Post-op Assessment: Report given to PACU RN and Post -op Vital signs reviewed and stable  Post vital signs: Reviewed and stable  Complications: No apparent anesthesia complications

## 2012-01-06 NOTE — Op Note (Signed)
NAME:  Penny Kim, Penny Kim NO.:  MEDICAL RECORD NO.:  [id number]  LOCATION:                                 FACILITY:  PHYSICIAN:  Etter Sjogren, M.D.     DATE OF BIRTH:  Jul 30, 1971  DATE OF PROCEDURE:  01/06/2012 DATE OF DISCHARGE:                              OPERATIVE REPORT   PREOPERATIVE DIAGNOSIS:  Breast cancer.  POSTOPERATIVE DIAGNOSIS:  Breast cancer.  PROCEDURE PERFORMED:  Bilateral removal of tissue expanders and placement of saline implants.  SURGEON:  Etter Sjogren, M.D.  ANESTHESIA:  General.  ESTIMATED BLOOD LOSS:  10 mL.  DRAINS:  None.  CLINICAL NOTE:  A 40 year old woman who has had breast cancer, has had reconstruction with tissue expanders, now presents for removal of expanders and placement of the implants.  The nature of this procedure and risks plus complications were discussed with her in detail.  Risks include, not limited to, bleeding, infection, anesthesia complications, healing problems, scarring, fluid accumulation, loss of sensation, failure of device, capsular contracture, displacement of device, wrinkles, ripples, pneumothorax, pulmonary embolism, asymmetry, chronic pain, disappointment, and she understood all of this and wished to proceed.  PROCEDURE:  The patient was marked in the holding area, taken to the operating room and placed supine.  After successful induction of anesthesia, she was prepped with ChloraPrep.  After waiting full 3 minutes for drying, she was draped with sterile drapes.  The lateral aspect of the old mastectomy scars were utilized.  Dissection was carried down through subcutaneous tissue to the muscle layer.  The skin flaps were developed for a very short distance and then a muscle- splitting incision used to access the tissue expanders, which were deflated gently, mobilized and removed.  The spaces were inspected and they were noted to be in excellent condition.  Thorough irrigation  with saline and then antibiotic solution was placed and allowed to dwell in the space for greater than 5 minutes.  After thoroughly cleaning the gloves, the implants were prepared.  These were Mentor smooth, round, high-profile 750 mL maximum fill saline implants.  100 mL sterile saline placed using a closed-filling system and the implants were placed in the antibiotic solution for greater than 5 minutes.  Spaces were inspected, noted excellent hemostasis.  The 3-0 Vicryl interrupted figure-of-eight sutures were pre-placed in less than time.  Antibiotic solution placed in the wound and the implants were then positioned, filled with the maximum 750 mL of saline using a closed- fill system with sterile saline.  Antibiotic solution again placed on the implants.  Care was taken to make sure the implants were flat without wrinkles or creases, and the 3-0 Vicryl sutures tied securely. The wound again irrigated with antibiotic solution.  The wound closure with 3-0 Monocryl interrupted inverted deep dermal sutures, running 4- 0 Monocryl subcuticular suture, followed by Dermabond.  ABD and a breast binder was placed, and she was transferred to the recovery room in stable condition and tolerated the procedure well.  DISPOSITION:  She will be discharged and follow up in the office within the next few days.     Etter Sjogren, M.D.  DB/MEDQ  D:  01/06/2012  T:  01/06/2012  Job:  [id number]

## 2012-01-10 ENCOUNTER — Encounter (HOSPITAL_BASED_OUTPATIENT_CLINIC_OR_DEPARTMENT_OTHER): Payer: Self-pay | Admitting: Plastic Surgery

## 2012-01-12 ENCOUNTER — Encounter (HOSPITAL_BASED_OUTPATIENT_CLINIC_OR_DEPARTMENT_OTHER): Payer: Self-pay

## 2012-01-31 ENCOUNTER — Ambulatory Visit: Payer: Self-pay | Admitting: Physical Therapy

## 2012-02-08 ENCOUNTER — Other Ambulatory Visit: Payer: Self-pay | Admitting: Lab

## 2012-02-09 ENCOUNTER — Ambulatory Visit (HOSPITAL_BASED_OUTPATIENT_CLINIC_OR_DEPARTMENT_OTHER): Payer: Self-pay | Admitting: Lab

## 2012-02-09 DIAGNOSIS — C50919 Malignant neoplasm of unspecified site of unspecified female breast: Secondary | ICD-10-CM

## 2012-02-09 DIAGNOSIS — C50911 Malignant neoplasm of unspecified site of right female breast: Secondary | ICD-10-CM

## 2012-02-09 DIAGNOSIS — C50319 Malignant neoplasm of lower-inner quadrant of unspecified female breast: Secondary | ICD-10-CM

## 2012-02-09 LAB — COMPREHENSIVE METABOLIC PANEL
BUN: 11 mg/dL (ref 6–23)
CO2: 22 mEq/L (ref 19–32)
Creatinine, Ser: 0.84 mg/dL (ref 0.50–1.10)
Glucose, Bld: 97 mg/dL (ref 70–99)
Total Bilirubin: 0.3 mg/dL (ref 0.3–1.2)
Total Protein: 7 g/dL (ref 6.0–8.3)

## 2012-02-09 LAB — CBC WITH DIFFERENTIAL/PLATELET
Basophils Absolute: 0 10*3/uL (ref 0.0–0.1)
EOS%: 1.9 % (ref 0.0–7.0)
HCT: 39 % (ref 34.8–46.6)
HGB: 13.1 g/dL (ref 11.6–15.9)
LYMPH%: 36.5 % (ref 14.0–49.7)
MCH: 28.9 pg (ref 25.1–34.0)
MCHC: 33.5 g/dL (ref 31.5–36.0)
MCV: 86.3 fL (ref 79.5–101.0)
MONO%: 8 % (ref 0.0–14.0)
NEUT%: 52.8 % (ref 38.4–76.8)

## 2012-02-15 ENCOUNTER — Encounter: Payer: Self-pay | Admitting: Physician Assistant

## 2012-02-15 ENCOUNTER — Ambulatory Visit (HOSPITAL_BASED_OUTPATIENT_CLINIC_OR_DEPARTMENT_OTHER): Payer: Self-pay | Admitting: Physician Assistant

## 2012-02-15 VITALS — BP 123/79 | HR 78 | Temp 99.1°F | Ht 65.0 in | Wt 138.2 lb

## 2012-02-15 DIAGNOSIS — Z170 Estrogen receptor positive status [ER+]: Secondary | ICD-10-CM

## 2012-02-15 DIAGNOSIS — C50911 Malignant neoplasm of unspecified site of right female breast: Secondary | ICD-10-CM

## 2012-02-15 DIAGNOSIS — C50919 Malignant neoplasm of unspecified site of unspecified female breast: Secondary | ICD-10-CM

## 2012-02-15 DIAGNOSIS — Z79810 Long term (current) use of selective estrogen receptor modulators (SERMs): Secondary | ICD-10-CM

## 2012-02-15 MED ORDER — TAMOXIFEN CITRATE 20 MG PO TABS: 20.0000 mg | ORAL_TABLET | Freq: Every day | ORAL | Status: DC

## 2012-02-15 NOTE — Progress Notes (Signed)
ID: Penny Kim   DOB: [birth year]  MR#: [id number]  WUJ#:[id number]  HISTORY OF PRESENT ILLNESS: Penny Kim is a 41 year old [place] woman referred by Dr. Dwain Sarna for evaluation and treatment of her right breast carcinoma. Patient had her first mammogram, a screening mammogram, at [facility] on 04/23/2010, showing diffuse calcifications in the lower inner quadrant of the right breast, measuring up to 5.6 cm. She was recalled for additional studies and on 04/27/2010 the breast was found to be dense, limiting mammographic sensitivity. It was again able to demonstrate calcifications throughout the lower inner quadrant of the right breast. 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  The patient was referred to Dr. Dwain Sarna. Bilateral breast MRIs were obtained 05/03/2010, showing in the right breast an area of enhancement totaling 7.7 cm. Most of this was nodular and non-masslike, but in addition, in the anterior aspect, there was a rounded area of masslike enhancement measuring 1.7 cm and suspicious for invasive ductal carcinoma. The left breast was unremarkable, no suspicious lymph nodes noted.  Additional biopsy was obtained for the more masslike area on 05/05/2010, ([id number]) showing a grade 2 invasive ductal carcinoma, ER +78%, PR +82%. 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 Proliferation marker was elevated at 85%.  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 0 of 4 lymph nodes were involved and there were ample margins. Tissue expanders were placed.  The patient was treated with adjuvant chemotherapy and received 4 dose dense cycles of doxorubicin and cyclophosphamide. 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 Started on tamoxifen in March of 2012, continues now at 20 mg daily. The plan is to continue tamoxifen for 5 years, then switch to an aromatase inhibitor for an additional 5 years.  Patient carries a BRCA2 mutation of unknown significance. The issue of whether she should undergo bilateral salpingo-oophorectomy will continue to be addressed, the patient having not had a menstrual cycle for almost one year now.    INTERVAL HISTORY: Penny Kim returns today for routine followup of her right breast carcinoma.  She continues on tamoxifen with good tolerance.  And Interval history is remarkable for her having undergone her final reconstruction under Dr. Odis Luster in April. Overall, she is relatively pleased with the results, although she feels like there is a small "pucker" in the left breast.  Penny Kim is currently living with her cousin. She tells me this is not ideal, but is necessary with her current financial situation. She is working part-time job at the [employer] and is hoping to obtain a second part-time job for the summer, until she confined full time employment.  She continues to meet with Dr. Enzo Bi a regular basis, and although Penny Kim tells me she still feels depressed at times, she is feeling positive about things and is "in a good place". She denies any suicidal ideations.  REVIEW OF SYSTEMS: Penny Kim has had no recent illnesses and denies fevers or chills. She does have some hot flashes, although these are minor.  She does have vaginal dryness. She has still not a menstrual cycle now for one year. We knows that she could in fact get pregnant, and she understands the need for barrier forms of birth control with intercourse.  Penny Kim  denies any nausea or change in bowel habits.  No chest pain. No increased shortness of breath. No abnormal headaches or dizziness. No new or unusual myalgias, arthralgias, or bony pain.  A detailed review of systems is  otherwise noncontributory.  PAST MEDICAL HISTORY: Past Medical History  Diagnosis Date  . Anxiety   . Breast cancer, right breast 08/24/2011  . Cancer 2011    diagnosed by routine mammogram. 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 s/p bilateral mastectomy and  chemotherapy. Currently on Tamoxifen. Expanders still in place. Reconstructive surgery not yet done due to lack of insurance.   . Depression     no current med.  . S/P chemotherapy, time since greater than 12 weeks     finished chemo 09/2010    PAST SURGICAL HISTORY: Past Surgical History  Procedure Date  . Port-a-cath removal 11/26/2010  . Tissue expander removal 10/20/2010    left  . Portacath placement 07/23/2010  . Insertion of tissue expander after mastectomy 06/19/2010    bilat. mastectomies with bilat. tissue expanders  . Breast surgery 06/19/2010    exploration of right mastectomy site due to post-op bleeding  . Breast reconstruction 01/06/2012    Procedure: BREAST RECONSTRUCTION;  Surgeon: Etter Sjogren, MD;  Location: [facility];  Service: Plastics;  Laterality: Bilateral;  bilateral removal of tissue expanders, bilateral placement of implants--Breast    FAMILY HISTORY Family History  Problem Relation Age of Onset  . Hypertension Mother   . Cancer Sister   . Cancer Maternal Grandmother   . Hypertension Maternal Grandmother   . Diabetes Maternal Grandmother   . Cancer Maternal Grandfather   . Hypertension Maternal Grandfather   . Diabetes Paternal Grandmother    GYNECOLOGIC HISTORY: She is GX, P0.    SOCIAL HISTORY: She lives by herself. She is not a church attender, and when asked who she would call in case of a problem, she really does not have anyone that she would call. She does not feel her family is supportive, and she has no close friends.   ADVANCED DIRECTIVES:  HEALTH MAINTENANCE: History  Substance Use Topics  . Smoking status:  Never Smoker   . Smokeless tobacco: Never Used  . Alcohol Use: 0.0 oz/week     occasionally     Colonoscopy:  PAP:  Bone density:  Lipid panel:  Allergies  Allergen Reactions  . Prochlorperazine Other (See Comments)    SYNCOPE  . Methadone Hcl Itching  . Tape Rash    Current Outpatient Prescriptions  Medication Sig Dispense Refill  . Multiple Vitamin (MULTIVITAMIN) tablet Take 1 tablet by mouth daily.      . tamoxifen (NOLVADEX) 20 MG tablet Take 1 tablet (20 mg total) by mouth daily.  90 tablet  3    OBJECTIVE: Filed Vitals:   02/15/12 1133  BP: 123/79  Pulse: 78  Temp: 99.1 F (37.3 C)     Body mass index is 23.00 kg/(m^2).    ECOG FS: 0  Physical Exam: HEENT:  Sclerae anicteric, conjunctivae pink.  Oropharynx clear.  No mucositis or candidiasis.   Nodes:  No cervical, supraclavicular, or axillary lymphadenopathy palpated.  Breast Exam: Status post bilateral mastectomies with implant reconstruction. No suspicious nodularity or skin changes. No evidence of local recurrence on the right.  Lungs:  Clear to auscultation bilaterally.  No crackles, rhonchi, or wheezes.   Heart:  Regular rate and rhythm.   Abdomen:  Soft,  thin, nontender.  Positive bowel sounds.  No organomegaly or masses palpated.   Musculoskeletal:  No focal spinal tenderness to palpation.  Extremities:  Benign.  No peripheral edema or cyanosis.   Skin:  Benign with the exception of mild hyperpigmentation on the upper extremities and the nailbeds.    Neuro:  Nonfocal. Alert and oriented x3.    LAB RESULTS: Lab Results  Component Value Date   WBC 3.4* 02/09/2012   NEUTROABS 1.8 02/09/2012   HGB 13.1 02/09/2012   HCT 39.0 02/09/2012   MCV 86.3 02/09/2012   PLT 193 02/09/2012      Chemistry      Component Value Date/Time   NA 141 02/09/2012 1008   K 3.9 02/09/2012 1008   CL 108 02/09/2012 1008   CO2 22 02/09/2012 1008   BUN 11 02/09/2012 1008   CREATININE 0.84 02/09/2012 1008      Component Value  Date/Time   CALCIUM 9.4 02/09/2012 1008   ALKPHOS 42 02/09/2012 1008   AST 20 02/09/2012 1008   ALT 12 02/09/2012 1008   BILITOT 0.3 02/09/2012 1008       Lab Results  Component Value Date   LABCA2 7 02/09/2012    STUDIES:  10/25/2011 *RADIOLOGY REPORT*  Clinical Data: Left chest pain. History of breast cancer. Status  post bilateral mastectomy.  CHEST - 2 VIEW  Comparison: PA and lateral chest 02/04/2011 and 06/22/2010. CT  chest 07/17/2010.  Findings: Bilateral tissue expanders are in place. Tissue expander  on the left is new since the prior study. The lungs are clear.  Heart size is normal. There is no pneumothorax or pleural  effusion. No focal bony abnormality.  IMPRESSION:  No acute finding. Negative for metastatic disease.  Original Report Authenticated By: Bernadene Bell. D'ALESSIO, M.D.   ASSESSMENT: 41 year old [place] woman,  (1) status post bilateral mastectomies in September 2011 for a right sided T2 N0, stage II a, grade 3 invasive ductal carcinoma. Tumor was ER and PR positive, HER-2/neu negative, with an elevated proliferation fraction.  (2) Status post adjuvant chemotherapy, consisting of 4 cycles of dose dense doxorubicin and cyclophosphamide followed by 4 weekly doses of paclitaxel. Paclitaxel was discontinued after 4 doses secondary to neuropathy which has resolved.  (3) Patient was started on tamoxifen in March of 2012 on which she continues with good tolerance. The plan is to continue for total of 5 years, followed by aromatase inhibitor for an additional 5 years.  (4) The patient did not require radiation therapy.  (5) She does carry a BRCA2 mutation of uncertain significance.    PLAN: With regards to her breast cancer, Kacie continues to do well, and there is no clinical evidence of disease recurrence at this time. She will continue on her tamoxifen, the plan being to complete a total of 5 years, followed by aromatase inhibitor for an additional 5 years.    I am referring Penny Kim to the lymphedema clinic to assess for lymphedema, and to assess her range of motion in the right upper extremity.   We will continue to follow Penny Kim closely and will see her every 2-3 months until her situation has stabilized a little more. She'll continue to be seen by Dr. Noe Gens regularly.  She will see Dr. Darnelle Catalan in  August.  Perhaps at that time, she continues to do well, we will extend her visits out a little further.  Penny Kim voices understanding and agreement with this plan, and will call with any changes or problems.  Mykala Mccready    02/15/2012

## 2012-02-22 ENCOUNTER — Ambulatory Visit (INDEPENDENT_AMBULATORY_CARE_PROVIDER_SITE_OTHER): Payer: Self-pay | Admitting: Psychiatry

## 2012-02-22 DIAGNOSIS — F0630 Mood disorder due to known physiological condition, unspecified: Secondary | ICD-10-CM

## 2012-02-22 DIAGNOSIS — F331 Major depressive disorder, recurrent, moderate: Secondary | ICD-10-CM

## 2012-02-22 NOTE — Progress Notes (Signed)
02-22-2012  Patient seen for individual psychotherapy.  She returns to treatment after having reconstructive surgery on her breasts.  She has found employment but still struggles with balance in her life.  Locus of control remains primarily external.  Next appointment 03-13-2012.

## 2012-03-13 ENCOUNTER — Ambulatory Visit (INDEPENDENT_AMBULATORY_CARE_PROVIDER_SITE_OTHER): Payer: Self-pay | Admitting: Psychiatry

## 2012-03-13 DIAGNOSIS — F0630 Mood disorder due to known physiological condition, unspecified: Secondary | ICD-10-CM

## 2012-03-13 DIAGNOSIS — F331 Major depressive disorder, recurrent, moderate: Secondary | ICD-10-CM

## 2012-03-13 NOTE — Progress Notes (Incomplete)
03-13-2012 Patient seen for individual psychotherapy.  Next appointment 03-20-2012.

## 2012-03-20 ENCOUNTER — Ambulatory Visit (INDEPENDENT_AMBULATORY_CARE_PROVIDER_SITE_OTHER): Payer: Self-pay | Admitting: Psychiatry

## 2012-03-20 DIAGNOSIS — F331 Major depressive disorder, recurrent, moderate: Secondary | ICD-10-CM

## 2012-03-20 DIAGNOSIS — F0630 Mood disorder due to known physiological condition, unspecified: Secondary | ICD-10-CM

## 2012-03-20 NOTE — Progress Notes (Signed)
03-20-2012  Patient seen for individual psychotherapy.  Next session 03-27-2012.

## 2012-03-27 ENCOUNTER — Ambulatory Visit (INDEPENDENT_AMBULATORY_CARE_PROVIDER_SITE_OTHER): Payer: Self-pay | Admitting: Psychiatry

## 2012-03-27 DIAGNOSIS — F0630 Mood disorder due to known physiological condition, unspecified: Secondary | ICD-10-CM

## 2012-03-27 DIAGNOSIS — F331 Major depressive disorder, recurrent, moderate: Secondary | ICD-10-CM

## 2012-03-28 NOTE — Progress Notes (Signed)
03-27-2012  Patient seen for individual psychotherapy.  Next appointment 04-10-2012.

## 2012-04-10 ENCOUNTER — Ambulatory Visit (INDEPENDENT_AMBULATORY_CARE_PROVIDER_SITE_OTHER): Payer: Self-pay | Admitting: Psychiatry

## 2012-04-10 DIAGNOSIS — F331 Major depressive disorder, recurrent, moderate: Secondary | ICD-10-CM

## 2012-04-10 DIAGNOSIS — F0630 Mood disorder due to known physiological condition, unspecified: Secondary | ICD-10-CM

## 2012-04-10 NOTE — Progress Notes (Signed)
04-10-2012  Patient seen for individual psychotherapy. Next appointment 04-24-2012.

## 2012-04-24 ENCOUNTER — Ambulatory Visit (INDEPENDENT_AMBULATORY_CARE_PROVIDER_SITE_OTHER): Payer: Self-pay | Admitting: Psychiatry

## 2012-04-24 DIAGNOSIS — F331 Major depressive disorder, recurrent, moderate: Secondary | ICD-10-CM

## 2012-04-24 DIAGNOSIS — F0630 Mood disorder due to known physiological condition, unspecified: Secondary | ICD-10-CM

## 2012-04-24 NOTE — Progress Notes (Signed)
04-24-2012  Patient seen for individual psychotherapy.  Next appointment 05-01-2012.

## 2012-04-25 ENCOUNTER — Other Ambulatory Visit: Payer: Self-pay | Admitting: Lab

## 2012-05-01 ENCOUNTER — Ambulatory Visit (INDEPENDENT_AMBULATORY_CARE_PROVIDER_SITE_OTHER): Payer: Self-pay | Admitting: Psychiatry

## 2012-05-01 DIAGNOSIS — F632 Kleptomania: Secondary | ICD-10-CM

## 2012-05-01 DIAGNOSIS — F0630 Mood disorder due to known physiological condition, unspecified: Secondary | ICD-10-CM

## 2012-05-01 DIAGNOSIS — F331 Major depressive disorder, recurrent, moderate: Secondary | ICD-10-CM

## 2012-05-01 NOTE — Progress Notes (Signed)
05-01-2012  Patient seen for individual psychotherapy.  Next appointment 05-08-2012.

## 2012-05-02 ENCOUNTER — Telehealth: Payer: Self-pay | Admitting: *Deleted

## 2012-05-02 ENCOUNTER — Ambulatory Visit (HOSPITAL_BASED_OUTPATIENT_CLINIC_OR_DEPARTMENT_OTHER): Payer: Self-pay | Admitting: Lab

## 2012-05-02 ENCOUNTER — Ambulatory Visit (HOSPITAL_BASED_OUTPATIENT_CLINIC_OR_DEPARTMENT_OTHER): Payer: Self-pay | Admitting: Oncology

## 2012-05-02 VITALS — BP 145/88 | HR 66 | Temp 98.4°F | Resp 20 | Ht 65.0 in | Wt 135.1 lb

## 2012-05-02 DIAGNOSIS — C50319 Malignant neoplasm of lower-inner quadrant of unspecified female breast: Secondary | ICD-10-CM

## 2012-05-02 DIAGNOSIS — C50919 Malignant neoplasm of unspecified site of unspecified female breast: Secondary | ICD-10-CM

## 2012-05-02 DIAGNOSIS — Z170 Estrogen receptor positive status [ER+]: Secondary | ICD-10-CM

## 2012-05-02 DIAGNOSIS — C50911 Malignant neoplasm of unspecified site of right female breast: Secondary | ICD-10-CM

## 2012-05-02 LAB — COMPREHENSIVE METABOLIC PANEL
AST: 24 U/L (ref 0–37)
Albumin: 4 g/dL (ref 3.5–5.2)
CO2: 25 mEq/L (ref 19–32)
Calcium: 9.3 mg/dL (ref 8.4–10.5)
Glucose, Bld: 93 mg/dL (ref 70–99)
Sodium: 141 mEq/L (ref 135–145)
Total Bilirubin: 0.3 mg/dL (ref 0.3–1.2)
Total Protein: 6.6 g/dL (ref 6.0–8.3)

## 2012-05-02 LAB — CBC WITH DIFFERENTIAL/PLATELET
Eosinophils Absolute: 0 10*3/uL (ref 0.0–0.5)
HCT: 36.7 % (ref 34.8–46.6)
HGB: 12.3 g/dL (ref 11.6–15.9)
LYMPH%: 43 % (ref 14.0–49.7)
MCH: 28.9 pg (ref 25.1–34.0)
MCV: 85.9 fL (ref 79.5–101.0)
MONO#: 0.4 10*3/uL (ref 0.1–0.9)
MONO%: 10.2 % (ref 0.0–14.0)
NEUT#: 1.6 10*3/uL (ref 1.5–6.5)
Platelets: 184 10*3/uL (ref 145–400)
RBC: 4.27 10*6/uL (ref 3.70–5.45)
WBC: 3.5 10*3/uL — ABNORMAL LOW (ref 3.9–10.3)

## 2012-05-02 LAB — FOLLICLE STIMULATING HORMONE: FSH: 52.5 m[IU]/mL

## 2012-05-02 MED ORDER — ESTRADIOL 25 MCG VA TABS: 25.0000 ug | ORAL_TABLET | VAGINAL | Status: AC

## 2012-05-02 NOTE — Progress Notes (Signed)
ID: Archie Patten   DOB: Oct 18, 1970  MR#: [id number]  CSN#:[id number]  HISTORY OF PRESENT ILLNESS: Chayce had noticed that her right breast dropped a little differently than the left, and occasionally she would have a strange sensation radiating to the right nipple, but she really did not pay much attention to this.  She had her first mammogram ever, a screening mammogram at [facility], 04/23/2010, and this showed diffuse calcifications in the lower inner quadrant of the right breast measuring up to 5.6 cm.  She was recalled 08/08, and Dr. Samuella Cota found the breast to be dense, which limits mammographic sensitivity, but again was able to demonstrate these calcifications throughout the lower inner quadrant of the right breast.  Biopsy was discussed, and performed 08/09, and this showed [phone]) ductal carcinoma in situ, high grade, which was estrogen receptor 100% and progesterone receptor 57% positive.  Bilateral breast MRIs were obtained 08/14.  This showed in the right breast the area of enhancement to total 7.7 cm anterior posteriorly.  Most of this was nodular and non-mass like, but in addition in the anterior aspect of this, there was a rounded area of mass-like enhancement measuring 1.7 cm.  This portion was suspicious for invasive ductal carcinoma.  The left breast was unremarkable, and there were no suspicious internal mammary or axillary lymph nodes noted.  Accordingly the patient was brought back for biopsy of the more mass-like area on 05/05/2010, and this biopsy ([id number]) showed a grade 2 invasive ductal carcinoma, which was estrogen receptor at 78% and progesterone receptor 82% positive.  The proliferation marker was elevated at 85%.  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 Her subsequent history is as detailed below.  INTERVAL HISTORY: Christal returns for followup of her breast cancer. The interval history is significant for her having taken herself off all her  psychiatric medications. She is seeing Enzo Bi here on a regular basis and tells me Dr. Noe Gens is "very helpful". She is not seeing Dr. Evelene Croon regularly.  REVIEW OF SYSTEMS: She is exercising about an hour a day up chiefly using a stationary bike in her home. She had problems with dyspareunia recently. She has had no periods since her chemotherapy. She has moderate hot flashes. She has some trouble sleeping, and is taking over-the-counter medications for that, as well as for her runny nose. Her bowel movements tend to be hard and not daily. She has some pain in the left breast, and does complain of anxiety and depression but no suicidal thoughts. A detailed review of systems today was otherwise noncontributory  PAST MEDICAL HISTORY: Past Medical History  Diagnosis Date  . Anxiety   . Breast cancer, right breast 08/24/2011  . Cancer 2011    diagnosed by routine mammogram. 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 s/p bilateral mastectomy and  chemotherapy. Currently on Tamoxifen. Expanders still in place. Reconstructive surgery not yet done due to lack of insurance.   . Depression     no current med.  . S/P chemotherapy, time since greater than 12 weeks     finished chemo 09/2010    PAST SURGICAL HISTORY: Past Surgical History  Procedure Date  . Port-a-cath removal 11/26/2010  . Tissue expander removal 10/20/2010    left  . Portacath placement 07/23/2010  . Insertion of tissue expander after mastectomy 06/19/2010    bilat. mastectomies with bilat. tissue expanders  . Breast surgery 06/19/2010  exploration of right mastectomy site due to post-op bleeding  . Breast reconstruction 01/06/2012    Procedure: BREAST RECONSTRUCTION;  Surgeon: Etter Sjogren, MD;  Location: [facility];  Service: Plastics;  Laterality: Bilateral;  bilateral removal of tissue expanders, bilateral placement of implants--Breast  Depression.  This has  been present from the patient's early 20s.  She has never been hospitalized for this.  When she looks back on the antidepressants that she has used, she did best with Zoloft, but she tells me she had to discontinue that because of reflux issues.  She is followed by Dr. Janeece Riggers in [place] (clinic number [phone]), and he has recently changed her from Remeron to Pristiq.  However, the Pristiq is $50 a month, and Roshell has not been able to afford it.  A second problem is borderline glucose intolerance (there is a strong family history of diabetes).  FAMILY HISTORY Family History  Problem Relation Age of Onset  . Hypertension Mother   . Cancer Sister   . Cancer Maternal Grandmother   . Hypertension Maternal Grandmother   . Diabetes Maternal Grandmother   . Cancer Maternal Grandfather   . Hypertension Maternal Grandfather   . Diabetes Paternal Grandmother   The patient's father died at the age of 54 from drug overdose in the setting of ETOH abuse.  The patient's mother is alive.  She has a significant history of depression.  The patient has one sister, also with a history of depression.  GYNECOLOGIC HISTORY: She is GX, P0.  Periods are regular.  Last about 4 days, of which 1 is heavy.  SOCIAL HISTORY: Wylie currently works part-time at the [employer].  She lives by herself.  She is not a church attender, and when asked who she would call in case of a problem, she really does not have anyone that she would call.  She does not feel her family is supportive, and she has no close friends.  She had a significant other, Gardiner Barefoot, but she has not seen him for the last 2 years.    ADVANCED DIRECTIVES:  HEALTH MAINTENANCE: History  Substance Use Topics  . Smoking status: Never Smoker   . Smokeless tobacco: Never Used  . Alcohol Use: 0.0 oz/week     occasionally     Colonoscopy:  PAP:  Bone density:  Lipid panel:  Allergies  Allergen Reactions  . Prochlorperazine Other (See Comments)     SYNCOPE  . Methadone Hcl Itching  . Tape Rash    Current Outpatient Prescriptions  Medication Sig Dispense Refill  . ascorbic acid (VITAMIN C) 500 MG tablet Take 500 mg by mouth daily.      . CHLOROPHYLL PO Take 60 mg by mouth daily.      . fish oil-omega-3 fatty acids 1000 MG capsule Take 1,200 g by mouth daily.      . Multiple Vitamin (MULTIVITAMIN) tablet Take 1 tablet by mouth daily.      . tamoxifen (NOLVADEX) 20 MG tablet Take 1 tablet (20 mg total) by mouth daily.  90 tablet  3    OBJECTIVE: Young-appearing African American woman in no acute distress Filed Vitals:   05/02/12 1114  BP: 145/88  Pulse: 66  Temp: 98.4 F (36.9 C)  Resp: 20     Body mass index is 22.48 kg/(m^2).    ECOG FS: 0  Sclerae unicteric Oropharynx clear No cervical or supraclavicular adenopathy Lungs no rales or rhonchi Heart regular rate and rhythm Abd  benign MSK no focal spinal tenderness, no peripheral edema Neuro: nonfocal; she is alert and oriented x3. Affect is appropriate Breasts: Status post bilateral mastectomies with implants in place. There is no evidence of local recurrence.  LAB RESULTS: Lab Results  Component Value Date   WBC 3.4* 02/09/2012   NEUTROABS 1.8 02/09/2012   HGB 13.1 02/09/2012   HCT 39.0 02/09/2012   MCV 86.3 02/09/2012   PLT 193 02/09/2012      Chemistry      Component Value Date/Time   NA 141 02/09/2012 1008   K 3.9 02/09/2012 1008   CL 108 02/09/2012 1008   CO2 22 02/09/2012 1008   BUN 11 02/09/2012 1008   CREATININE 0.84 02/09/2012 1008      Component Value Date/Time   CALCIUM 9.4 02/09/2012 1008   ALKPHOS 42 02/09/2012 1008   AST 20 02/09/2012 1008   ALT 12 02/09/2012 1008   BILITOT 0.3 02/09/2012 1008       Lab Results  Component Value Date   LABCA2 7 02/09/2012    No components found with this basename: [id number]    No results found for this basename: INR:1;PROTIME:1 in the last 168 hours  Urinalysis    Component Value Date/Time   COLORURINE  YELLOW 03/19/2011 1250   APPEARANCEUR CLEAR 03/19/2011 1250   LABSPEC 1.010 12/27/2011 1623   PHURINE 5.5 12/27/2011 1623   GLUCOSEU NEGATIVE 12/27/2011 1623   HGBUR NEGATIVE 12/27/2011 1623   BILIRUBINUR NEGATIVE 12/27/2011 1623   KETONESUR NEGATIVE 12/27/2011 1623   PROTEINUR NEGATIVE 12/27/2011 1623   UROBILINOGEN 0.2 12/27/2011 1623   NITRITE NEGATIVE 12/27/2011 1623   LEUKOCYTESUR NEGATIVE 12/27/2011 1623    STUDIES: No results found.  ASSESSMENT: 41 y.o. [place] woman status post bilateral mastectomies September 2011 for a right-sided T2 N0, stage IIA invasive ductal carcinoma, grade 3, estrogen and progesterone receptor positive, HER2 negative with an elevated MIB-1, treated with 4 cycles of adjuvant doxorubicin/cyclophosphamide given in dose-dense fashion and 4 weekly doses of paclitaxel discontinued because of neuropathy.  The patient carries a BRCA 2 mutation of uncertain significance.  She started tamoxifen in March 2012.  She did not require radiation.   PLAN: The plan is to continue tamoxifen for a total of 5 years and then reassess. I will be checking an FSH and estradiol today but likely she is permanently postmenopausal. While on tamoxifen she may use Vagifem suppositories I wrote her the prescription today and explained how to use that medication. We will have to watch out for of uterus in lining thickening, and after the next visit we will set her up for a transvaginal ultrasound to serve as baseline. I also wrote her a prescription for a compression sleeve, since she is exercising so vigorously, and for rehabilitation, which she did not make it to after the completion of her reconstruction. Otherwise she will return to see [place] in 6 months. She knows to call for any problems that may develop before that visit.   Yuriy Cui C    05/02/2012

## 2012-05-02 NOTE — Telephone Encounter (Signed)
Gave patient appointment for 09-2012 with the lab one week before the md appointment faxed over order to the lympdema clinic

## 2012-05-08 ENCOUNTER — Ambulatory Visit (INDEPENDENT_AMBULATORY_CARE_PROVIDER_SITE_OTHER): Payer: Self-pay | Admitting: Psychiatry

## 2012-05-08 DIAGNOSIS — F0630 Mood disorder due to known physiological condition, unspecified: Secondary | ICD-10-CM

## 2012-05-08 DIAGNOSIS — F331 Major depressive disorder, recurrent, moderate: Secondary | ICD-10-CM

## 2012-05-08 NOTE — Progress Notes (Signed)
05-08-2012  Patient seen for individual psychotherapy.  Next appointment 05-29-2012.

## 2012-05-09 LAB — ESTRADIOL, ULTRA SENS: Estradiol, Ultra Sensitive: 3 pg/mL

## 2012-05-15 ENCOUNTER — Ambulatory Visit: Payer: Self-pay | Admitting: Physical Therapy

## 2012-05-29 ENCOUNTER — Ambulatory Visit (INDEPENDENT_AMBULATORY_CARE_PROVIDER_SITE_OTHER): Payer: Medicaid Other | Admitting: Psychiatry

## 2012-05-29 DIAGNOSIS — F331 Major depressive disorder, recurrent, moderate: Secondary | ICD-10-CM

## 2012-05-29 DIAGNOSIS — F632 Kleptomania: Secondary | ICD-10-CM

## 2012-05-29 DIAGNOSIS — F0630 Mood disorder due to known physiological condition, unspecified: Secondary | ICD-10-CM

## 2012-05-30 NOTE — Progress Notes (Signed)
05-29-2012  Patient seen for individual psychotherapy.  Next appointment 06-12-2012.

## 2012-06-05 ENCOUNTER — Ambulatory Visit: Payer: Medicaid Other | Attending: Physician Assistant | Admitting: Physical Therapy

## 2012-06-05 DIAGNOSIS — IMO0001 Reserved for inherently not codable concepts without codable children: Secondary | ICD-10-CM | POA: Insufficient documentation

## 2012-06-05 DIAGNOSIS — N644 Mastodynia: Secondary | ICD-10-CM | POA: Insufficient documentation

## 2012-06-05 DIAGNOSIS — R079 Chest pain, unspecified: Secondary | ICD-10-CM | POA: Insufficient documentation

## 2012-06-05 DIAGNOSIS — C50919 Malignant neoplasm of unspecified site of unspecified female breast: Secondary | ICD-10-CM | POA: Insufficient documentation

## 2012-06-05 DIAGNOSIS — M25519 Pain in unspecified shoulder: Secondary | ICD-10-CM | POA: Insufficient documentation

## 2012-06-12 ENCOUNTER — Ambulatory Visit: Payer: Medicaid Other | Admitting: Psychiatry

## 2012-06-12 ENCOUNTER — Ambulatory Visit: Payer: Medicaid Other | Admitting: Physical Therapy

## 2012-06-12 NOTE — Progress Notes (Signed)
06-12-2012 Patient was no show for appointment but arrived 20 later thinking her appointment was at 3.  Next appointment 06-21-2012.

## 2012-06-19 ENCOUNTER — Ambulatory Visit: Payer: Medicaid Other | Admitting: Physical Therapy

## 2012-06-21 ENCOUNTER — Ambulatory Visit (INDEPENDENT_AMBULATORY_CARE_PROVIDER_SITE_OTHER): Payer: Medicaid Other | Admitting: Psychiatry

## 2012-06-21 DIAGNOSIS — F632 Kleptomania: Secondary | ICD-10-CM

## 2012-06-21 DIAGNOSIS — F331 Major depressive disorder, recurrent, moderate: Secondary | ICD-10-CM

## 2012-06-21 DIAGNOSIS — F0630 Mood disorder due to known physiological condition, unspecified: Secondary | ICD-10-CM

## 2012-06-22 ENCOUNTER — Ambulatory Visit: Payer: Self-pay | Admitting: Psychiatry

## 2012-06-26 ENCOUNTER — Encounter: Payer: Medicaid Other | Admitting: Physical Therapy

## 2012-07-03 ENCOUNTER — Encounter: Payer: Medicaid Other | Admitting: Physical Therapy

## 2012-07-04 ENCOUNTER — Ambulatory Visit: Payer: Medicaid Other | Admitting: Psychiatry

## 2012-07-12 ENCOUNTER — Ambulatory Visit: Payer: Medicaid Other | Admitting: Psychiatry

## 2012-07-13 ENCOUNTER — Ambulatory Visit: Payer: Medicaid Other | Admitting: Psychiatry

## 2012-08-22 ENCOUNTER — Ambulatory Visit (INDEPENDENT_AMBULATORY_CARE_PROVIDER_SITE_OTHER): Payer: Medicaid Other | Admitting: Psychiatry

## 2012-08-22 DIAGNOSIS — F331 Major depressive disorder, recurrent, moderate: Secondary | ICD-10-CM

## 2012-08-29 ENCOUNTER — Ambulatory Visit (INDEPENDENT_AMBULATORY_CARE_PROVIDER_SITE_OTHER): Payer: Medicaid Other | Admitting: Psychiatry

## 2012-08-29 DIAGNOSIS — F0630 Mood disorder due to known physiological condition, unspecified: Secondary | ICD-10-CM

## 2012-08-29 DIAGNOSIS — F632 Kleptomania: Secondary | ICD-10-CM

## 2012-08-29 DIAGNOSIS — F331 Major depressive disorder, recurrent, moderate: Secondary | ICD-10-CM

## 2012-09-06 ENCOUNTER — Ambulatory Visit (INDEPENDENT_AMBULATORY_CARE_PROVIDER_SITE_OTHER): Payer: Medicaid Other | Admitting: Psychiatry

## 2012-09-06 DIAGNOSIS — F0630 Mood disorder due to known physiological condition, unspecified: Secondary | ICD-10-CM

## 2012-09-06 DIAGNOSIS — F632 Kleptomania: Secondary | ICD-10-CM

## 2012-09-06 DIAGNOSIS — F331 Major depressive disorder, recurrent, moderate: Secondary | ICD-10-CM

## 2012-09-25 ENCOUNTER — Other Ambulatory Visit (HOSPITAL_BASED_OUTPATIENT_CLINIC_OR_DEPARTMENT_OTHER): Payer: Medicaid Other | Admitting: Lab

## 2012-09-25 ENCOUNTER — Other Ambulatory Visit: Payer: Self-pay | Admitting: *Deleted

## 2012-09-25 DIAGNOSIS — C50911 Malignant neoplasm of unspecified site of right female breast: Secondary | ICD-10-CM

## 2012-09-25 DIAGNOSIS — C50319 Malignant neoplasm of lower-inner quadrant of unspecified female breast: Secondary | ICD-10-CM

## 2012-09-25 LAB — COMPREHENSIVE METABOLIC PANEL (CC13)
AST: 24 U/L (ref 5–34)
Albumin: 3.8 g/dL (ref 3.5–5.0)
Alkaline Phosphatase: 48 U/L (ref 40–150)
Potassium: 3.9 mEq/L (ref 3.5–5.1)
Sodium: 141 mEq/L (ref 136–145)
Total Protein: 7.6 g/dL (ref 6.4–8.3)

## 2012-09-25 LAB — CBC WITH DIFFERENTIAL/PLATELET
BASO%: 0.8 % (ref 0.0–2.0)
EOS%: 2.5 % (ref 0.0–7.0)
MCH: 29.9 pg (ref 25.1–34.0)
MCHC: 34.4 g/dL (ref 31.5–36.0)
RBC: 4.55 10*6/uL (ref 3.70–5.45)
RDW: 13.6 % (ref 11.2–14.5)
lymph#: 1.5 10*3/uL (ref 0.9–3.3)

## 2012-09-26 ENCOUNTER — Ambulatory Visit (INDEPENDENT_AMBULATORY_CARE_PROVIDER_SITE_OTHER): Payer: Medicaid Other | Admitting: Psychiatry

## 2012-09-26 DIAGNOSIS — F331 Major depressive disorder, recurrent, moderate: Secondary | ICD-10-CM

## 2012-09-26 DIAGNOSIS — F632 Kleptomania: Secondary | ICD-10-CM

## 2012-09-26 DIAGNOSIS — F0630 Mood disorder due to known physiological condition, unspecified: Secondary | ICD-10-CM

## 2012-09-29 ENCOUNTER — Telehealth: Payer: Self-pay | Admitting: Oncology

## 2012-09-29 NOTE — Telephone Encounter (Signed)
s/w pt and she is aware of her appt change on 1/13 to 1/14 with sj      anne

## 2012-10-02 ENCOUNTER — Ambulatory Visit: Payer: Self-pay | Admitting: Oncology

## 2012-10-02 ENCOUNTER — Ambulatory Visit (HOSPITAL_BASED_OUTPATIENT_CLINIC_OR_DEPARTMENT_OTHER): Payer: Medicaid Other | Admitting: Oncology

## 2012-10-02 VITALS — BP 144/88 | HR 60 | Temp 97.5°F | Resp 20 | Ht 65.0 in | Wt 137.8 lb

## 2012-10-02 DIAGNOSIS — C50319 Malignant neoplasm of lower-inner quadrant of unspecified female breast: Secondary | ICD-10-CM

## 2012-10-02 DIAGNOSIS — C50911 Malignant neoplasm of unspecified site of right female breast: Secondary | ICD-10-CM

## 2012-10-02 DIAGNOSIS — Z170 Estrogen receptor positive status [ER+]: Secondary | ICD-10-CM

## 2012-10-02 NOTE — Progress Notes (Signed)
ID: Penny Kim   DOB: 04-21-1971  MR#: [id number]  CSN#:[id number]  PCP: Penny Chancy, MD GYN: SU: Penny Kim OTHER MD: Penny Kim, Penny Kim, Penny Kim  HISTORY OF PRESENT ILLNESS: Penny Kim had noticed that her right breast dropped a little differently than the left, and occasionally she would have a strange sensation radiating to the right nipple, but she really did not pay much attention to this.  She had her first mammogram ever, a screening mammogram at [facility], 04/23/2010, and this showed diffuse calcifications in the lower inner quadrant of the right breast measuring up to 5.6 cm.  She was recalled 08/08, and Penny Kim found the breast to be dense, which limits mammographic sensitivity, but again was able to demonstrate these calcifications throughout the lower inner quadrant of the right breast.  Biopsy was discussed, and performed 08/09, and this showed [phone]) ductal carcinoma in situ, high grade, which was estrogen receptor 100% and progesterone receptor 57% positive.  Bilateral breast MRIs were obtained 08/14.  This showed in the right breast the area of enhancement to total 7.7 cm anterior posteriorly.  Most of this was nodular and non-mass like, but in addition in the anterior aspect of this, there was a rounded area of mass-like enhancement measuring 1.7 cm.  This portion was suspicious for invasive ductal carcinoma.  The left breast was unremarkable, and there were no suspicious internal mammary or axillary lymph nodes noted.  Accordingly the patient was brought back for biopsy of the more mass-like area on 05/05/2010, and this biopsy ([id number]) showed a grade 2 invasive ductal carcinoma, which was estrogen receptor at 78% and progesterone receptor 82% positive.  The proliferation marker was elevated at 85%.  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 Her subsequent history is as detailed below.  INTERVAL HISTORY: Penny Kim returns for  followup of her breast cancer. The interval history is generally unremarkable. She has changed jobs again, as noted below, and she is living with her aunt, Penny Kim, in [place], to save money on rent  REVIEW OF SYSTEMS: [place] [facility] [place]. She uses the elliptical there. She is considering the [facility] program. She is noted a little bit of irritation in her mid upper back, between the shoulder blades at about the level of T4-T5. She has mild sinus problems. She continues to have issues with anxiety and depression, although considerably better than before. She has hot flashes frequently but not enough to want her to take medication for it. A detailed review of systems was otherwise noncontributory.  PAST MEDICAL HISTORY: Past Medical History  Diagnosis Date  . Anxiety   . Breast cancer, right breast 08/24/2011  . Cancer 2011    diagnosed by routine mammogram. 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 s/p bilateral mastectomy and  chemotherapy. Currently on Tamoxifen. Expanders still in place. Reconstructive surgery not yet done due to lack of insurance.   . Depression     no current med.  . S/P chemotherapy, time since greater than 12 weeks     finished chemo 09/2010    PAST SURGICAL HISTORY: Past Surgical History  Procedure Date  . Port-a-cath removal 11/26/2010  . Tissue expander removal 10/20/2010    left  . Portacath placement 07/23/2010  . Insertion of tissue expander after mastectomy 06/19/2010    bilat. mastectomies with bilat. tissue expanders  . Breast surgery 06/19/2010  exploration of right mastectomy site due to post-op bleeding  . Breast reconstruction 01/06/2012    Procedure: BREAST RECONSTRUCTION;  Surgeon: Penny Sjogren, MD;  Location: [facility];  Service: Plastics;  Laterality: Bilateral;  bilateral removal of tissue expanders, bilateral placement of implants--Breast    Depression.  This has been present from the patient's early 20s.  She has never been hospitalized for this.  When she looks back on the antidepressants that she has used, she did best with Penny Kim, but she tells me she had to discontinue that because of reflux issues.  She is followed by Dr. Janeece Kim in [place] (clinic number [phone]), and he has recently changed her from Penny Kim to Penny Kim.  However, the Penny Kim is $50 a month, and [name] has not been able to afford it.  A second problem is borderline glucose intolerance (there is a strong family history of diabetes).  FAMILY HISTORY Family History  Problem Relation Age of Onset  . Hypertension Mother   . Cancer Sister   . Cancer Maternal Grandmother   . Hypertension Maternal Grandmother   . Diabetes Maternal Grandmother   . Cancer Maternal Grandfather   . Hypertension Maternal Grandfather   . Diabetes Paternal Grandmother   The patient's father died at the age of 24 from drug overdose in the setting of ETOH abuse.  The patient's mother is alive.  She has a significant history of depression.  The patient has one sister, also with a history of depression.  GYNECOLOGIC HISTORY: She is GX, P0.  She stopped having periods with chemotherapy. They have not resumed.  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 She is considering that possibility. She is not a Advice worker. When asked who she would call in case of a problem, she really does not have anyone that she would call.  She does not feel her family is supportive, and she has no close friends.     ADVANCED DIRECTIVES:  HEALTH MAINTENANCE: History  Substance Use Topics  . Smoking status: Never Smoker   . Smokeless tobacco: Never Used  . Alcohol Use: 0.0 oz/week     Comment: occasionally      Colonoscopy:  PAP:  Bone density:  Lipid panel:  Allergies  Allergen Reactions  . Prochlorperazine Other (See Comments)    SYNCOPE  . Methadone Hcl Itching  . Tape Rash    Current Outpatient Prescriptions  Medication Sig Dispense Refill  . ascorbic acid (VITAMIN C) 500 MG tablet Take 500 mg by mouth daily.      . CHLOROPHYLL PO Take 60 mg by mouth daily.      Marland Kitchen estradiol (VAGIFEM) 25 MCG vaginal tablet Place 1 tablet (25 mcg total) vaginally 3 (three) times a week.  16 tablet  12  . fish oil-omega-3 fatty acids 1000 MG capsule Take 1,200 g by mouth daily.      . Multiple Vitamin (MULTIVITAMIN) tablet Take 1 tablet by mouth daily.      . tamoxifen (NOLVADEX) 20 MG tablet Take 1 tablet (20 mg total) by mouth daily.  90 tablet  3    OBJECTIVE: Young-appearing African American woman in no acute distress Filed Vitals:   10/02/12 1451  BP: 144/88  Pulse: 60  Temp: 97.5 F (36.4 C)  Resp: 20  Body mass index is 22.93 kg/(m^2).    ECOG FS: 0  Sclerae unicteric Oropharynx clear No cervical or supraclavicular adenopathy Lungs no rales or rhonchi Heart regular rate and rhythm Abd benign MSK no focal spinal tenderness, and specifically vigorous palpation and percussion in the upper thoracic spine area elicits no tenderness or discomfort; no peripheral edema Neuro: nonfocal; she is well oriented; affect is positive Breasts: Status post bilateral mastectomies with implants in place. There is no evidence of local recurrence. Both axillae are benign  LAB RESULTS: Lab Results  Component Value Date   WBC 3.3* 09/25/2012   NEUTROABS 1.4* 09/25/2012   HGB 13.6 09/25/2012   HCT 39.6 09/25/2012   MCV 87.1 09/25/2012   PLT 192 09/25/2012      Chemistry      Component Value Date/Time   NA 141 09/25/2012 1208   NA 141 05/02/2012 1212   K 3.9 09/25/2012 1208   K 3.9 05/02/2012 1212   CL 107 09/25/2012 1208   CL 108 05/02/2012 1212   CO2 24 09/25/2012 1208   CO2 25 05/02/2012 1212   BUN 11.0  09/25/2012 1208   BUN 9 05/02/2012 1212   CREATININE 0.9 09/25/2012 1208   CREATININE 0.87 05/02/2012 1212      Component Value Date/Time   CALCIUM 9.7 09/25/2012 1208   CALCIUM 9.3 05/02/2012 1212   ALKPHOS 48 09/25/2012 1208   ALKPHOS 36* 05/02/2012 1212   AST 24 09/25/2012 1208   AST 24 05/02/2012 1212   ALT 15 09/25/2012 1208   ALT 17 05/02/2012 1212   BILITOT 0.34 09/25/2012 1208   BILITOT 0.3 05/02/2012 1212       Lab Results  Component Value Date   LABCA2 11 09/25/2012    No components found with this basename: [id number]    No results found for this basename: INR:1;PROTIME:1 in the last 168 hours  Urinalysis    Component Value Date/Time   COLORURINE YELLOW 03/19/2011 1250   APPEARANCEUR CLEAR 03/19/2011 1250   LABSPEC 1.010 12/27/2011 1623   PHURINE 5.5 12/27/2011 1623   GLUCOSEU NEGATIVE 12/27/2011 1623   HGBUR NEGATIVE 12/27/2011 1623   BILIRUBINUR NEGATIVE 12/27/2011 1623   KETONESUR NEGATIVE 12/27/2011 1623   PROTEINUR NEGATIVE 12/27/2011 1623   UROBILINOGEN 0.2 12/27/2011 1623   NITRITE NEGATIVE 12/27/2011 1623   LEUKOCYTESUR NEGATIVE 12/27/2011 1623    STUDIES: No results found.   ASSESSMENT: 42 y.o. 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  The patient carries a BRCA 2 mutation of uncertain significance.  She started tamoxifen in March 2012.  She did not require radiation.   PLAN: She is doing overall very well, now more than 2 years out from her definitive surgery without any evidence of recurrence. The plan is to continue tamoxifen 25 years and possibly 10, or we could switch to an aromatase inhibitor after 5 years. In general current data  suggests that in younger women 10 years of therapy are superior.  She knows to call for any problems that may develop before the next visit which will be in 6 months.   Aayden Cefalu C    10/02/2012

## 2012-10-03 ENCOUNTER — Telehealth: Payer: Self-pay | Admitting: *Deleted

## 2012-10-03 ENCOUNTER — Ambulatory Visit: Payer: Self-pay | Admitting: Nurse Practitioner

## 2012-10-03 ENCOUNTER — Ambulatory Visit (INDEPENDENT_AMBULATORY_CARE_PROVIDER_SITE_OTHER): Payer: Medicaid Other | Admitting: Psychiatry

## 2012-10-03 DIAGNOSIS — F331 Major depressive disorder, recurrent, moderate: Secondary | ICD-10-CM

## 2012-10-03 DIAGNOSIS — F0630 Mood disorder due to known physiological condition, unspecified: Secondary | ICD-10-CM

## 2012-10-03 DIAGNOSIS — F632 Kleptomania: Secondary | ICD-10-CM

## 2012-10-03 NOTE — Telephone Encounter (Signed)
Appt w/ [facility];6 months  Pt aware of appointment

## 2012-10-10 ENCOUNTER — Ambulatory Visit (INDEPENDENT_AMBULATORY_CARE_PROVIDER_SITE_OTHER): Payer: Medicaid Other | Admitting: Psychiatry

## 2012-10-10 DIAGNOSIS — F0630 Mood disorder due to known physiological condition, unspecified: Secondary | ICD-10-CM

## 2012-10-10 DIAGNOSIS — F331 Major depressive disorder, recurrent, moderate: Secondary | ICD-10-CM

## 2012-10-17 ENCOUNTER — Ambulatory Visit (INDEPENDENT_AMBULATORY_CARE_PROVIDER_SITE_OTHER): Payer: Medicaid Other | Admitting: Psychiatry

## 2012-10-17 DIAGNOSIS — F0630 Mood disorder due to known physiological condition, unspecified: Secondary | ICD-10-CM

## 2012-10-17 DIAGNOSIS — F331 Major depressive disorder, recurrent, moderate: Secondary | ICD-10-CM

## 2012-10-24 ENCOUNTER — Ambulatory Visit (INDEPENDENT_AMBULATORY_CARE_PROVIDER_SITE_OTHER): Payer: Medicaid Other | Admitting: Psychiatry

## 2012-10-24 DIAGNOSIS — F331 Major depressive disorder, recurrent, moderate: Secondary | ICD-10-CM

## 2012-10-24 DIAGNOSIS — F0630 Mood disorder due to known physiological condition, unspecified: Secondary | ICD-10-CM

## 2012-10-31 ENCOUNTER — Ambulatory Visit (INDEPENDENT_AMBULATORY_CARE_PROVIDER_SITE_OTHER): Payer: Medicaid Other | Admitting: Psychiatry

## 2012-10-31 DIAGNOSIS — F0630 Mood disorder due to known physiological condition, unspecified: Secondary | ICD-10-CM

## 2012-10-31 DIAGNOSIS — F331 Major depressive disorder, recurrent, moderate: Secondary | ICD-10-CM

## 2012-11-07 ENCOUNTER — Ambulatory Visit (INDEPENDENT_AMBULATORY_CARE_PROVIDER_SITE_OTHER): Payer: Medicaid Other | Admitting: Psychiatry

## 2012-11-07 DIAGNOSIS — F331 Major depressive disorder, recurrent, moderate: Secondary | ICD-10-CM

## 2012-11-07 DIAGNOSIS — F0630 Mood disorder due to known physiological condition, unspecified: Secondary | ICD-10-CM

## 2012-11-14 ENCOUNTER — Ambulatory Visit (INDEPENDENT_AMBULATORY_CARE_PROVIDER_SITE_OTHER): Payer: Medicaid Other | Admitting: Psychiatry

## 2012-11-14 DIAGNOSIS — F331 Major depressive disorder, recurrent, moderate: Secondary | ICD-10-CM

## 2012-11-14 DIAGNOSIS — F0630 Mood disorder due to known physiological condition, unspecified: Secondary | ICD-10-CM

## 2012-11-21 ENCOUNTER — Ambulatory Visit: Payer: Medicaid Other | Admitting: Psychiatry

## 2012-11-28 ENCOUNTER — Ambulatory Visit: Payer: Medicaid Other | Admitting: Psychiatry

## 2012-11-29 ENCOUNTER — Ambulatory Visit (INDEPENDENT_AMBULATORY_CARE_PROVIDER_SITE_OTHER): Payer: Medicaid Other | Admitting: Psychiatry

## 2012-11-29 DIAGNOSIS — F0630 Mood disorder due to known physiological condition, unspecified: Secondary | ICD-10-CM

## 2012-11-29 DIAGNOSIS — F331 Major depressive disorder, recurrent, moderate: Secondary | ICD-10-CM

## 2012-12-05 ENCOUNTER — Ambulatory Visit: Payer: Medicaid Other | Admitting: Psychiatry

## 2012-12-06 ENCOUNTER — Ambulatory Visit (INDEPENDENT_AMBULATORY_CARE_PROVIDER_SITE_OTHER): Payer: Medicaid Other | Admitting: Psychiatry

## 2012-12-06 DIAGNOSIS — F0630 Mood disorder due to known physiological condition, unspecified: Secondary | ICD-10-CM

## 2012-12-06 DIAGNOSIS — F331 Major depressive disorder, recurrent, moderate: Secondary | ICD-10-CM

## 2012-12-12 ENCOUNTER — Ambulatory Visit (INDEPENDENT_AMBULATORY_CARE_PROVIDER_SITE_OTHER): Payer: Medicaid Other | Admitting: Psychiatry

## 2012-12-12 DIAGNOSIS — F0630 Mood disorder due to known physiological condition, unspecified: Secondary | ICD-10-CM

## 2012-12-12 DIAGNOSIS — F331 Major depressive disorder, recurrent, moderate: Secondary | ICD-10-CM

## 2012-12-19 ENCOUNTER — Ambulatory Visit (INDEPENDENT_AMBULATORY_CARE_PROVIDER_SITE_OTHER): Payer: Medicaid Other | Admitting: Psychiatry

## 2012-12-19 DIAGNOSIS — F331 Major depressive disorder, recurrent, moderate: Secondary | ICD-10-CM

## 2012-12-19 DIAGNOSIS — F0630 Mood disorder due to known physiological condition, unspecified: Secondary | ICD-10-CM

## 2012-12-25 ENCOUNTER — Other Ambulatory Visit: Payer: Medicaid Other | Admitting: Lab

## 2012-12-26 ENCOUNTER — Ambulatory Visit (INDEPENDENT_AMBULATORY_CARE_PROVIDER_SITE_OTHER): Payer: Medicaid Other | Admitting: Psychiatry

## 2012-12-26 DIAGNOSIS — F0630 Mood disorder due to known physiological condition, unspecified: Secondary | ICD-10-CM

## 2012-12-26 DIAGNOSIS — F331 Major depressive disorder, recurrent, moderate: Secondary | ICD-10-CM

## 2013-01-01 ENCOUNTER — Ambulatory Visit: Payer: Medicaid Other | Admitting: Physician Assistant

## 2013-01-01 ENCOUNTER — Encounter: Payer: Self-pay | Admitting: Physician Assistant

## 2013-01-01 NOTE — Progress Notes (Signed)
[facility] today.  Letter mailed to patient.   Zollie Scale, PA-C 01/01/2013

## 2013-01-02 ENCOUNTER — Ambulatory Visit: Payer: Medicaid Other | Admitting: Psychiatry

## 2013-01-03 ENCOUNTER — Other Ambulatory Visit: Payer: Self-pay | Admitting: Physician Assistant

## 2013-01-03 NOTE — Telephone Encounter (Signed)
Was [facility] for last appointment. Will refill X 1 month and require f/u appointment for future refills.

## 2013-01-09 ENCOUNTER — Ambulatory Visit (INDEPENDENT_AMBULATORY_CARE_PROVIDER_SITE_OTHER): Payer: Medicaid Other | Admitting: Psychiatry

## 2013-01-09 DIAGNOSIS — F331 Major depressive disorder, recurrent, moderate: Secondary | ICD-10-CM

## 2013-01-09 DIAGNOSIS — F0630 Mood disorder due to known physiological condition, unspecified: Secondary | ICD-10-CM

## 2013-01-16 ENCOUNTER — Ambulatory Visit (INDEPENDENT_AMBULATORY_CARE_PROVIDER_SITE_OTHER): Payer: BC Managed Care – PPO | Admitting: Psychiatry

## 2013-01-16 DIAGNOSIS — F0630 Mood disorder due to known physiological condition, unspecified: Secondary | ICD-10-CM

## 2013-01-16 DIAGNOSIS — F331 Major depressive disorder, recurrent, moderate: Secondary | ICD-10-CM

## 2013-01-18 ENCOUNTER — Telehealth: Payer: Self-pay | Admitting: *Deleted

## 2013-01-18 NOTE — Telephone Encounter (Signed)
Pt called to r/s her missed appts. gv appt for a 1pm lab on 01/30/13, and ov@ 10:45am on 02/06/13...td

## 2013-01-23 ENCOUNTER — Ambulatory Visit (INDEPENDENT_AMBULATORY_CARE_PROVIDER_SITE_OTHER): Payer: Medicaid Other | Admitting: Psychiatry

## 2013-01-23 DIAGNOSIS — F331 Major depressive disorder, recurrent, moderate: Secondary | ICD-10-CM

## 2013-01-23 DIAGNOSIS — F0630 Mood disorder due to known physiological condition, unspecified: Secondary | ICD-10-CM

## 2013-01-29 ENCOUNTER — Other Ambulatory Visit: Payer: Self-pay | Admitting: *Deleted

## 2013-01-29 DIAGNOSIS — C50911 Malignant neoplasm of unspecified site of right female breast: Secondary | ICD-10-CM

## 2013-01-30 ENCOUNTER — Ambulatory Visit (INDEPENDENT_AMBULATORY_CARE_PROVIDER_SITE_OTHER): Payer: Medicaid Other | Admitting: Psychiatry

## 2013-01-30 ENCOUNTER — Other Ambulatory Visit (HOSPITAL_BASED_OUTPATIENT_CLINIC_OR_DEPARTMENT_OTHER): Payer: Medicaid Other

## 2013-01-30 DIAGNOSIS — C50319 Malignant neoplasm of lower-inner quadrant of unspecified female breast: Secondary | ICD-10-CM

## 2013-01-30 DIAGNOSIS — F0630 Mood disorder due to known physiological condition, unspecified: Secondary | ICD-10-CM

## 2013-01-30 DIAGNOSIS — C50911 Malignant neoplasm of unspecified site of right female breast: Secondary | ICD-10-CM

## 2013-01-30 DIAGNOSIS — F331 Major depressive disorder, recurrent, moderate: Secondary | ICD-10-CM

## 2013-01-30 LAB — CBC WITH DIFFERENTIAL/PLATELET
Basophils Absolute: 0 10*3/uL (ref 0.0–0.1)
EOS%: 2 % (ref 0.0–7.0)
HGB: 12.6 g/dL (ref 11.6–15.9)
LYMPH%: 51.4 % — ABNORMAL HIGH (ref 14.0–49.7)
MCH: 29 pg (ref 25.1–34.0)
MCV: 85.3 fL (ref 79.5–101.0)
MONO%: 7.7 % (ref 0.0–14.0)
Platelets: 193 10*3/uL (ref 145–400)
RBC: 4.36 10*6/uL (ref 3.70–5.45)
RDW: 13.3 % (ref 11.2–14.5)

## 2013-01-30 LAB — COMPREHENSIVE METABOLIC PANEL (CC13)
Albumin: 3.7 g/dL (ref 3.5–5.0)
CO2: 28 mEq/L (ref 22–29)
Glucose: 94 mg/dl (ref 70–99)
Sodium: 141 mEq/L (ref 136–145)
Total Bilirubin: 0.26 mg/dL (ref 0.20–1.20)
Total Protein: 7.2 g/dL (ref 6.4–8.3)

## 2013-02-06 ENCOUNTER — Telehealth: Payer: Self-pay | Admitting: Oncology

## 2013-02-06 ENCOUNTER — Encounter: Payer: Self-pay | Admitting: Physician Assistant

## 2013-02-06 ENCOUNTER — Ambulatory Visit (HOSPITAL_BASED_OUTPATIENT_CLINIC_OR_DEPARTMENT_OTHER): Payer: Medicaid Other | Admitting: Physician Assistant

## 2013-02-06 ENCOUNTER — Ambulatory Visit: Payer: Medicaid Other | Admitting: Psychiatry

## 2013-02-06 VITALS — BP 119/82 | HR 74 | Temp 98.2°F | Ht 65.0 in | Wt 139.0 lb

## 2013-02-06 DIAGNOSIS — Z170 Estrogen receptor positive status [ER+]: Secondary | ICD-10-CM

## 2013-02-06 DIAGNOSIS — H547 Unspecified visual loss: Secondary | ICD-10-CM

## 2013-02-06 DIAGNOSIS — C50911 Malignant neoplasm of unspecified site of right female breast: Secondary | ICD-10-CM

## 2013-02-06 DIAGNOSIS — H539 Unspecified visual disturbance: Secondary | ICD-10-CM

## 2013-02-06 DIAGNOSIS — C50319 Malignant neoplasm of lower-inner quadrant of unspecified female breast: Secondary | ICD-10-CM

## 2013-02-06 DIAGNOSIS — R030 Elevated blood-pressure reading, without diagnosis of hypertension: Secondary | ICD-10-CM

## 2013-02-06 MED ORDER — TAMOXIFEN CITRATE 20 MG PO TABS: 20.0000 mg | ORAL_TABLET | Freq: Every day | ORAL | Status: DC

## 2013-02-06 NOTE — Progress Notes (Signed)
ID: Penny Kim   DOB: Jan 03, 1971  MR#: [id number]  CSN#:[id number]  PCP: Marena Chancy, MD GYN: SUEmelia Loron OTHER MD: Etter Sjogren, Enzo Bi, Rupinder Kaur  HISTORY OF PRESENT ILLNESS: Penny Kim had noticed that her right breast dropped a little differently than the left, and occasionally she would have a strange sensation radiating to the right nipple, but she really did not pay much attention to this.  She had her first mammogram ever, a screening mammogram at [facility], 04/23/2010, and this showed diffuse calcifications in the lower inner quadrant of the right breast measuring up to 5.6 cm.  She was recalled 08/08, and Dr. Samuella Cota found the breast to be dense, which limits mammographic sensitivity, but again was able to demonstrate these calcifications throughout the lower inner quadrant of the right breast.  Biopsy was discussed, and performed 08/09, and this showed [phone]) ductal carcinoma in situ, high grade, which was estrogen receptor 100% and progesterone receptor 57% positive.  Bilateral breast MRIs were obtained 08/14.  This showed in the right breast the area of enhancement to total 7.7 cm anterior posteriorly.  Most of this was nodular and non-mass like, but in addition in the anterior aspect of this, there was a rounded area of mass-like enhancement measuring 1.7 cm.  This portion was suspicious for invasive ductal carcinoma.  The left breast was unremarkable, and there were no suspicious internal mammary or axillary lymph nodes noted.  Accordingly the patient was brought back for biopsy of the more mass-like area on 05/05/2010, and this biopsy ([id number]) showed a grade 2 invasive ductal carcinoma, which was estrogen receptor at 78% and progesterone receptor 82% positive.  The proliferation marker was elevated at 85%.  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 Her subsequent history is as detailed below.  INTERVAL HISTORY: Penny Kim returns for  routine followup of her right breast cancer. She continues on tamoxifen which she is tolerating well. She continues to have hot flashes, but these have actually improved. She's had no problems with vaginal bleeding, but does have some mild vaginal dryness. She is concerned about some change in vision since beginning the tamoxifen. She also has a history of hypertension.  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 She is exercising regularly. Overall she is feeling very well.   REVIEW OF SYSTEMS: Penny Kim denies any recent illnesses and has had no fevers, chills, or night sweats. She's had no skin changes and denies any abnormal bruising or bleeding. She has occasional nausea if she goes too long without eating, but has had no emesis, and denies any change in bowel habits. She's had no new cough, increased shortness of breath, chest pain, palpitation. She does have some postsurgical pain in the breasts bilaterally, but this comes and goes. She's had no abnormal headaches or dizziness. She denies any unusual myalgias, arthralgias, or bony pain, and specifically denies any back pain today. She continues to have some anxiety and depression although this seems to be well-controlled.  A detailed review of systems is otherwise noncontributory.   PAST MEDICAL HISTORY: Past Medical History  Diagnosis Date  . Anxiety   . Breast cancer, right breast 08/24/2011  . Cancer 2011    diagnosed by routine mammogram. 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 s/p bilateral mastectomy and  chemotherapy. Currently on Tamoxifen. Expanders still in place. Reconstructive surgery not  yet done due to lack of insurance.   . Depression     no current med.  . S/P chemotherapy, time since greater than 12 weeks     finished chemo 09/2010    PAST SURGICAL HISTORY: Past Surgical History  Procedure Laterality Date  .  Port-a-cath removal  11/26/2010  . Tissue expander removal  10/20/2010    left  . Portacath placement  07/23/2010  . Insertion of tissue expander after mastectomy  06/19/2010    bilat. mastectomies with bilat. tissue expanders  . Breast surgery  06/19/2010    exploration of right mastectomy site due to post-op bleeding  . Breast reconstruction  01/06/2012    Procedure: BREAST RECONSTRUCTION;  Surgeon: Etter Sjogren, MD;  Location: [facility];  Service: Plastics;  Laterality: Bilateral;  bilateral removal of tissue expanders, bilateral placement of implants--Breast  Depression.  This has been present from the patient's early 20s.  She has never been hospitalized for this.  When she looks back on the antidepressants that she has used, she did best with Zoloft, but she tells me she had to discontinue that because of reflux issues.  She is followed by Dr. Janeece Riggers in [place] (clinic number [phone]), and he has recently changed her from Remeron to Pristiq.  However, the Pristiq is $50 a month, and Penny Kim has not been able to afford it.  A second problem is borderline glucose intolerance (there is a strong family history of diabetes).  FAMILY HISTORY Family History  Problem Relation Age of Onset  . Hypertension Mother   . Cancer Sister   . Cancer Maternal Grandmother   . Hypertension Maternal Grandmother   . Diabetes Maternal Grandmother   . Cancer Maternal Grandfather   . Hypertension Maternal Grandfather   . Diabetes Paternal Grandmother   The patient's father died at the age of 64 from drug overdose in the setting of ETOH abuse.  The patient's mother is alive.  She has a significant history of depression.  The patient has one sister, also with a history of depression.  GYNECOLOGIC HISTORY: She is GX, P0.  She stopped having periods with chemotherapy. They have not resumed.  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  She is not a Advice worker. When asked who she would call in case of a problem, she really does not have anyone that she would call.  She does not feel her family is supportive, and she has no close friends.     ADVANCED DIRECTIVES:  HEALTH MAINTENANCE: History  Substance Use Topics  . Smoking status: Never Smoker   . Smokeless tobacco: Never Used  . Alcohol Use: 0.0 oz/week     Comment: occasionally     Colonoscopy:  PAP:  Bone density:  Lipid panel:  Allergies  Allergen Reactions  . Prochlorperazine Other (See Comments)    SYNCOPE  . Methadone Hcl Itching  . Tape Rash    Current Outpatient Prescriptions  Medication Sig Dispense Refill  . ascorbic acid (VITAMIN C) 500 MG tablet Take 500 mg by mouth daily.      . CHLOROPHYLL PO Take 60 mg by mouth daily.      Marland Kitchen estradiol (VAGIFEM) 25 MCG vaginal tablet Place 1 tablet (25 mcg total) vaginally 3 (three) times a week.  16 tablet  12  . fish oil-omega-3  fatty acids 1000 MG capsule Take 1,200 g by mouth daily.      . Multiple Vitamin (MULTIVITAMIN) tablet Take 1 tablet by mouth daily.      . tamoxifen (NOLVADEX) 20 MG tablet Take 1 tablet (20 mg total) by mouth daily.  90 tablet  1   No current facility-administered medications for this visit.    OBJECTIVE: Young-appearing African American woman in no acute distress Filed Vitals:   02/06/13 1102  BP: 119/82  Pulse: 74  Temp: 98.2 F (36.8 C)     Body mass index is 23.13 kg/(m^2).    ECOG FS: 0 Filed Weights   02/06/13 1102  Weight: 139 lb (63.05 kg)   Sclerae unicteric Oropharynx clear No cervical or supraclavicular adenopathy Lungs clear to auscultation bilaterally, no rales or rhonchi Heart regular rate and rhythm Abdomen soft, nontender to palpation, positive bowel sounds MSK no focal spinal tenderness; no peripheral edema Neuro: nonfocal; she is well oriented;  positive affect  Breasts: Status post bilateral mastectomies with implants in place. There is no evidence of local recurrence. Both axillae are benign, with no palpable adenopathy.  LAB RESULTS: Lab Results  Component Value Date   WBC 3.9 01/30/2013   NEUTROABS 1.5 01/30/2013   HGB 12.6 01/30/2013   HCT 37.2 01/30/2013   MCV 85.3 01/30/2013   PLT 193 01/30/2013      Chemistry      Component Value Date/Time   NA 141 01/30/2013 1313   NA 141 05/02/2012 1212   K 3.8 01/30/2013 1313   K 3.9 05/02/2012 1212   CL 105 01/30/2013 1313   CL 108 05/02/2012 1212   CO2 28 01/30/2013 1313   CO2 25 05/02/2012 1212   BUN 10.8 01/30/2013 1313   BUN 9 05/02/2012 1212   CREATININE 0.9 01/30/2013 1313   CREATININE 0.87 05/02/2012 1212      Component Value Date/Time   CALCIUM 9.2 01/30/2013 1313   CALCIUM 9.3 05/02/2012 1212   ALKPHOS 44 01/30/2013 1313   ALKPHOS 36* 05/02/2012 1212   AST 24 01/30/2013 1313   AST 24 05/02/2012 1212   ALT 18 01/30/2013 1313   ALT 17 05/02/2012 1212   BILITOT 0.26 01/30/2013 1313   BILITOT 0.3 05/02/2012 1212       STUDIES: Most recent chest x-ray on 10/25/2011 was unremarkable.   ASSESSMENT: 42 y.o. 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    (3) She did not require radiation.   (4)  She started tamoxifen in March 2012.     PLAN:  Burnie appears to be doing very well with regards to her breast cancer, and will continue on the tamoxifen which I have refilled for her today. The plan is to continue the tamoxifen for at least 5 years, and possibly 10.  I am  referring her for evaluation with Dr. Charlotte Sanes for changes in vision since beginning tamoxifen. Otherwise, we will see Mirabelle again for routine followup in 6 months. She knows to call prior that time, however, with any changes or problems.   She is doing overall very well, now more than 2 years  out from her definitive surgery without any evidence of recurrence. The plan is to continue tamoxifen 25 years and possibly 10, or we could switch to an aromatase inhibitor after 5 years. In general current data suggests that in younger women 10 years of therapy are superior.  She knows to call for any problems that may develop before the next visit which will be in 6 months.   Deniece Rankin    02/06/2013

## 2013-02-13 ENCOUNTER — Ambulatory Visit (INDEPENDENT_AMBULATORY_CARE_PROVIDER_SITE_OTHER): Payer: BC Managed Care – PPO | Admitting: Psychiatry

## 2013-02-13 DIAGNOSIS — F0630 Mood disorder due to known physiological condition, unspecified: Secondary | ICD-10-CM

## 2013-02-13 DIAGNOSIS — F331 Major depressive disorder, recurrent, moderate: Secondary | ICD-10-CM

## 2013-02-20 ENCOUNTER — Ambulatory Visit (INDEPENDENT_AMBULATORY_CARE_PROVIDER_SITE_OTHER): Payer: BC Managed Care – PPO | Admitting: Psychiatry

## 2013-02-20 DIAGNOSIS — F0630 Mood disorder due to known physiological condition, unspecified: Secondary | ICD-10-CM

## 2013-02-20 DIAGNOSIS — F331 Major depressive disorder, recurrent, moderate: Secondary | ICD-10-CM

## 2013-02-27 ENCOUNTER — Ambulatory Visit (INDEPENDENT_AMBULATORY_CARE_PROVIDER_SITE_OTHER): Payer: BC Managed Care – PPO | Admitting: Psychiatry

## 2013-02-27 DIAGNOSIS — F0630 Mood disorder due to known physiological condition, unspecified: Secondary | ICD-10-CM

## 2013-02-27 DIAGNOSIS — F331 Major depressive disorder, recurrent, moderate: Secondary | ICD-10-CM

## 2013-03-02 ENCOUNTER — Ambulatory Visit: Payer: Medicaid Other | Admitting: Physician Assistant

## 2013-03-06 ENCOUNTER — Ambulatory Visit: Payer: BC Managed Care – PPO | Admitting: Psychiatry

## 2013-03-14 ENCOUNTER — Ambulatory Visit: Payer: BC Managed Care – PPO | Admitting: Psychiatry

## 2013-03-28 ENCOUNTER — Ambulatory Visit (INDEPENDENT_AMBULATORY_CARE_PROVIDER_SITE_OTHER): Payer: BC Managed Care – PPO | Admitting: Psychiatry

## 2013-03-28 DIAGNOSIS — F0630 Mood disorder due to known physiological condition, unspecified: Secondary | ICD-10-CM

## 2013-03-28 DIAGNOSIS — F331 Major depressive disorder, recurrent, moderate: Secondary | ICD-10-CM

## 2013-04-11 ENCOUNTER — Ambulatory Visit (INDEPENDENT_AMBULATORY_CARE_PROVIDER_SITE_OTHER): Payer: Self-pay | Admitting: Psychiatry

## 2013-04-11 DIAGNOSIS — F0630 Mood disorder due to known physiological condition, unspecified: Secondary | ICD-10-CM

## 2013-04-11 DIAGNOSIS — F331 Major depressive disorder, recurrent, moderate: Secondary | ICD-10-CM

## 2013-04-25 ENCOUNTER — Ambulatory Visit (INDEPENDENT_AMBULATORY_CARE_PROVIDER_SITE_OTHER): Payer: BC Managed Care – PPO | Admitting: Psychiatry

## 2013-04-25 DIAGNOSIS — F0630 Mood disorder due to known physiological condition, unspecified: Secondary | ICD-10-CM

## 2013-04-25 DIAGNOSIS — F331 Major depressive disorder, recurrent, moderate: Secondary | ICD-10-CM

## 2013-05-09 ENCOUNTER — Ambulatory Visit (INDEPENDENT_AMBULATORY_CARE_PROVIDER_SITE_OTHER): Payer: BC Managed Care – PPO | Admitting: Psychiatry

## 2013-05-09 DIAGNOSIS — F331 Major depressive disorder, recurrent, moderate: Secondary | ICD-10-CM

## 2013-05-09 DIAGNOSIS — F0630 Mood disorder due to known physiological condition, unspecified: Secondary | ICD-10-CM

## 2013-05-23 ENCOUNTER — Ambulatory Visit: Payer: BC Managed Care – PPO | Admitting: Psychiatry

## 2013-06-06 ENCOUNTER — Ambulatory Visit: Payer: Self-pay | Admitting: Psychiatry

## 2013-06-13 ENCOUNTER — Ambulatory Visit (INDEPENDENT_AMBULATORY_CARE_PROVIDER_SITE_OTHER): Payer: BC Managed Care – PPO | Admitting: Psychiatry

## 2013-06-13 DIAGNOSIS — F331 Major depressive disorder, recurrent, moderate: Secondary | ICD-10-CM

## 2013-06-13 DIAGNOSIS — F0630 Mood disorder due to known physiological condition, unspecified: Secondary | ICD-10-CM

## 2013-06-15 ENCOUNTER — Ambulatory Visit: Payer: Self-pay | Admitting: Adult Health

## 2013-06-27 ENCOUNTER — Ambulatory Visit (INDEPENDENT_AMBULATORY_CARE_PROVIDER_SITE_OTHER): Payer: BC Managed Care – PPO | Admitting: Psychiatry

## 2013-06-27 DIAGNOSIS — F331 Major depressive disorder, recurrent, moderate: Secondary | ICD-10-CM

## 2013-06-27 DIAGNOSIS — F0630 Mood disorder due to known physiological condition, unspecified: Secondary | ICD-10-CM

## 2013-07-11 ENCOUNTER — Ambulatory Visit (INDEPENDENT_AMBULATORY_CARE_PROVIDER_SITE_OTHER): Payer: BC Managed Care – PPO | Admitting: Psychiatry

## 2013-07-11 DIAGNOSIS — F331 Major depressive disorder, recurrent, moderate: Secondary | ICD-10-CM

## 2013-07-11 DIAGNOSIS — F0630 Mood disorder due to known physiological condition, unspecified: Secondary | ICD-10-CM

## 2013-07-25 ENCOUNTER — Ambulatory Visit (INDEPENDENT_AMBULATORY_CARE_PROVIDER_SITE_OTHER): Payer: Self-pay | Admitting: Psychiatry

## 2013-07-25 DIAGNOSIS — F0630 Mood disorder due to known physiological condition, unspecified: Secondary | ICD-10-CM

## 2013-07-25 DIAGNOSIS — F331 Major depressive disorder, recurrent, moderate: Secondary | ICD-10-CM

## 2013-07-30 ENCOUNTER — Ambulatory Visit (INDEPENDENT_AMBULATORY_CARE_PROVIDER_SITE_OTHER): Payer: Self-pay | Admitting: Adult Health

## 2013-07-30 ENCOUNTER — Encounter (INDEPENDENT_AMBULATORY_CARE_PROVIDER_SITE_OTHER): Payer: Self-pay

## 2013-07-30 ENCOUNTER — Encounter: Payer: Self-pay | Admitting: Adult Health

## 2013-07-30 VITALS — BP 140/90 | HR 78 | Temp 98.6°F | Resp 16 | Ht 64.0 in | Wt 143.0 lb

## 2013-07-30 DIAGNOSIS — R5381 Other malaise: Secondary | ICD-10-CM

## 2013-07-30 DIAGNOSIS — F419 Anxiety disorder, unspecified: Secondary | ICD-10-CM | POA: Insufficient documentation

## 2013-07-30 DIAGNOSIS — F3289 Other specified depressive episodes: Secondary | ICD-10-CM

## 2013-07-30 DIAGNOSIS — F32A Depression, unspecified: Secondary | ICD-10-CM | POA: Insufficient documentation

## 2013-07-30 DIAGNOSIS — R5383 Other fatigue: Secondary | ICD-10-CM

## 2013-07-30 DIAGNOSIS — I10 Essential (primary) hypertension: Secondary | ICD-10-CM

## 2013-07-30 DIAGNOSIS — F329 Major depressive disorder, single episode, unspecified: Secondary | ICD-10-CM

## 2013-07-30 DIAGNOSIS — Z0000 Encounter for general adult medical examination without abnormal findings: Secondary | ICD-10-CM

## 2013-07-30 MED ORDER — HYDROCHLOROTHIAZIDE 25 MG PO TABS: ORAL_TABLET | ORAL | Status: DC

## 2013-07-30 NOTE — Assessment & Plan Note (Signed)
Currently on Zoloft 200 mg daily. She believes this medication makes her drowsy. Suggested taking the medication in the evening to see if this improves her symptoms. She is followed by psychiatry.

## 2013-07-30 NOTE — Assessment & Plan Note (Addendum)
Blood pressure readings have been elevated. Start HCTZ 25 mg 1/2 tablet daily in the morning. Follow up in 3 months or sooner if necessary.

## 2013-07-30 NOTE — Patient Instructions (Signed)
   Thank you for choosing [facility] for your health care needs.  Please have your labs drawn tomorrow when you go to the Cancer Center. Let them know there are additional labs that I have ordered.  The results will be available through MyChart for your convenience. Please remember to activate this. The activation code is located at the end of this form.  I am starting you on HCTZ 25 mg tablets - take 1/2 tablet in the morning for your blood pressure.   Please schedule a follow up appointment to see me in February.

## 2013-07-30 NOTE — Progress Notes (Signed)
Pre visit review using our clinic review tool, if applicable. No additional management support is needed unless otherwise documented below in the visit note. 

## 2013-07-30 NOTE — Progress Notes (Signed)
Subjective:    Patient ID: Penny Kim, female    DOB: May 17, 1971, 42 y.o.   MRN: [id number]  HPI  Patient is a pleasant 42 y/o female who presents to establish care. Patient reports recent added stress with leaving her previous employer. Her blood pressures have been elevated at times and she attributes this to her increased stress and lack of exercise. She reports feeling slightly fatigued. She is scheduled to have lab work done at the cancer center at [facility] (cbc, cmet). I will add additional tests to prevent her from getting stuck twice. Patient reports last PAP 2012 at [facility]. She was also followed by Dr. Evern Core in [place]. Will request medical records. Patient with hx of depression and currently on Zoloft. Feels symptoms improved although she feels "sleepy" during the day.     Past Medical History  Diagnosis Date  . Anxiety   . Breast cancer, right breast 02/2010    s/p chemo (completed 09/2010) and right mastectomy w/reconstruction  . Depression   . HTN (hypertension)      Past Surgical History  Procedure Laterality Date  . Port-a-cath removal  11/26/2010  . Tissue expander removal  10/20/2010    left  . Portacath placement  07/23/2010  . Insertion of tissue expander after mastectomy  06/19/2010    bilat. mastectomies with bilat. tissue expanders  . Breast surgery  06/19/2010    exploration of right mastectomy site due to post-op bleeding  . Breast reconstruction  01/06/2012    Procedure: BREAST RECONSTRUCTION;  Surgeon: Etter Sjogren, MD;  Location: [facility];  Service: Plastics;  Laterality: Bilateral;  bilateral removal of tissue expanders, bilateral placement of implants--Breast     Family History  Problem Relation Age of Onset  . Hypertension Mother   . Depression Mother   . Cancer Sister 30    Ovarian cancer  . Hypertension Maternal Grandmother   . Diabetes Maternal Grandmother   . Cancer Maternal Grandfather     Lung  cancer - Education officer, environmental and smoker  . Hypertension Maternal Grandfather   . Diabetes Paternal Grandmother   . Hypertension Paternal Grandmother   . Kidney disease Paternal Grandmother     End stage renal dz - dialysis  . Hyperlipidemia Paternal Grandmother   . Alcohol abuse Father   . Depression Father   . Drug abuse Father 56    Died of drug overdose     History   Social History  . Marital Status: Single    Spouse Name: N/A    Number of Children: N/A  . Years of Education: 61   Occupational History  . Scientist, research (physical sciences)   Social History Main Topics  . Smoking status: Never Smoker   . Smokeless tobacco: Never Used  . Alcohol Use: 0.0 oz/week     Comment: occasionally  . Drug Use: No  . Sexual Activity: Yes   Other Topics Concern  . Not on file   Social History Narrative   Penny Kim [place], [place]. She attended [employer] where she obtained her Bachelors of Science degree in Psychology. 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 She currently lives in [place]. She lives alone. Ayme enjoys attending live musical performances. 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      Review of Systems  Constitutional:  Positive for fatigue.  Eyes: Negative.   Respiratory: Negative.   Cardiovascular: Negative.   Gastrointestinal: Positive for constipation.       She will take an over the counter tea to help with constipation  Endocrine: Negative.   Genitourinary: Negative.   Skin: Negative.   Allergic/Immunologic: Negative.   Neurological: Positive for headaches.  Hematological: Negative.   Psychiatric/Behavioral: Negative for behavioral problems and agitation. The patient is nervous/anxious.        Objective:   Physical Exam  Constitutional: She is oriented to person, place, and time. She appears well-developed and  well-nourished. No distress.  HENT:  Head: Normocephalic and atraumatic.  Right Ear: External ear normal.  Left Ear: External ear normal.  Nose: Nose normal.  Mouth/Throat: Oropharynx is clear and moist.  Eyes: Conjunctivae and EOM are normal. Pupils are equal, round, and reactive to light.  Neck: Normal range of motion. Neck supple. No tracheal deviation present. No thyromegaly present.  Cardiovascular: Normal rate, regular rhythm, normal heart sounds and intact distal pulses.  Exam reveals no gallop and no friction rub.   No murmur heard. Pulmonary/Chest: Effort normal and breath sounds normal. No respiratory distress. She has no wheezes. She has no rales.  Abdominal: Soft. Bowel sounds are normal. She exhibits no distension and no mass. There is no tenderness. There is no rebound and no guarding.  Musculoskeletal: Normal range of motion. She exhibits no edema and no tenderness.  Lymphadenopathy:    She has no cervical adenopathy.  Neurological: She is alert and oriented to person, place, and time. She has normal reflexes. No cranial nerve deficit. Coordination normal.  Skin: Skin is warm and dry.  Psychiatric: She has a normal mood and affect. Her behavior is normal. Judgment and thought content normal.    BP 140/90  Pulse 78  Temp(Src) 98.6 F (37 C) (Oral)  Resp 16  Ht 5\' 4"  (1.626 m)  Wt 143 lb (64.864 kg)  BMI 24.53 kg/m2  SpO2 96%       Assessment & Plan:

## 2013-07-30 NOTE — Assessment & Plan Note (Signed)
Normal physical exam excluding breast, pelvic/pap. Check labs: TSH, lipids, vit D, B12. She will also have CBC and cmet drawn for follow-up with oncology. Request medical records from [facility] and Dr. Enos Fling.

## 2013-07-31 ENCOUNTER — Other Ambulatory Visit (HOSPITAL_BASED_OUTPATIENT_CLINIC_OR_DEPARTMENT_OTHER): Payer: Self-pay

## 2013-07-31 DIAGNOSIS — C50319 Malignant neoplasm of lower-inner quadrant of unspecified female breast: Secondary | ICD-10-CM

## 2013-07-31 DIAGNOSIS — C50911 Malignant neoplasm of unspecified site of right female breast: Secondary | ICD-10-CM

## 2013-07-31 LAB — CBC WITH DIFFERENTIAL/PLATELET
Basophils Absolute: 0 10*3/uL (ref 0.0–0.1)
EOS%: 2.7 % (ref 0.0–7.0)
HCT: 38.1 % (ref 34.8–46.6)
HGB: 12.6 g/dL (ref 11.6–15.9)
LYMPH%: 38.4 % (ref 14.0–49.7)
MCH: 28.3 pg (ref 25.1–34.0)
MCV: 85.4 fL (ref 79.5–101.0)
MONO%: 9.7 % (ref 0.0–14.0)
NEUT%: 48.2 % (ref 38.4–76.8)
Platelets: 172 10*3/uL (ref 145–400)
RDW: 14.4 % (ref 11.2–14.5)
lymph#: 1.4 10*3/uL (ref 0.9–3.3)

## 2013-07-31 LAB — COMPREHENSIVE METABOLIC PANEL (CC13)
AST: 28 U/L (ref 5–34)
Anion Gap: 10 mEq/L (ref 3–11)
BUN: 10.4 mg/dL (ref 7.0–26.0)
CO2: 24 mEq/L (ref 22–29)
Calcium: 9.7 mg/dL (ref 8.4–10.4)
Chloride: 106 mEq/L (ref 98–109)
Creatinine: 0.8 mg/dL (ref 0.6–1.1)
Sodium: 140 mEq/L (ref 136–145)
Total Bilirubin: 0.37 mg/dL (ref 0.20–1.20)
Total Protein: 7.5 g/dL (ref 6.4–8.3)

## 2013-08-07 ENCOUNTER — Encounter: Payer: Self-pay | Admitting: Physician Assistant

## 2013-08-07 ENCOUNTER — Ambulatory Visit (HOSPITAL_BASED_OUTPATIENT_CLINIC_OR_DEPARTMENT_OTHER): Payer: Self-pay | Admitting: Lab

## 2013-08-07 ENCOUNTER — Telehealth: Payer: Self-pay | Admitting: *Deleted

## 2013-08-07 ENCOUNTER — Ambulatory Visit (HOSPITAL_BASED_OUTPATIENT_CLINIC_OR_DEPARTMENT_OTHER): Payer: Self-pay | Admitting: Physician Assistant

## 2013-08-07 VITALS — BP 132/86 | HR 73 | Temp 98.0°F | Resp 18 | Ht 64.0 in | Wt 145.1 lb

## 2013-08-07 DIAGNOSIS — Z0000 Encounter for general adult medical examination without abnormal findings: Secondary | ICD-10-CM

## 2013-08-07 DIAGNOSIS — I10 Essential (primary) hypertension: Secondary | ICD-10-CM

## 2013-08-07 DIAGNOSIS — Z13220 Encounter for screening for lipoid disorders: Secondary | ICD-10-CM

## 2013-08-07 DIAGNOSIS — R5383 Other fatigue: Secondary | ICD-10-CM

## 2013-08-07 DIAGNOSIS — Z170 Estrogen receptor positive status [ER+]: Secondary | ICD-10-CM

## 2013-08-07 DIAGNOSIS — C50911 Malignant neoplasm of unspecified site of right female breast: Secondary | ICD-10-CM

## 2013-08-07 DIAGNOSIS — C50319 Malignant neoplasm of lower-inner quadrant of unspecified female breast: Secondary | ICD-10-CM

## 2013-08-07 LAB — URINALYSIS, MICROSCOPIC - CHCC
Bilirubin (Urine): NEGATIVE
Glucose: NEGATIVE mg/dL
Ketones: NEGATIVE mg/dL
Nitrite: NEGATIVE
RBC / HPF: NEGATIVE (ref 0–2)
Specific Gravity, Urine: 1.005 (ref 1.003–1.035)
Urobilinogen, UR: 0.2 mg/dL (ref 0.2–1)
pH: 6 (ref 4.6–8.0)

## 2013-08-07 LAB — TSH CHCC: TSH: 1.393 m(IU)/L (ref 0.308–3.960)

## 2013-08-07 NOTE — Telephone Encounter (Signed)
APPTS MADE AND PRINTED. PT IS AWARE TO HAVE AN CXR IN jUNE. PT WILL CALL AND MAKE AN APPT WITH THE OB/GYN SHE ALREADY AN ESTABLISHED PATIENT....TD

## 2013-08-07 NOTE — Progress Notes (Signed)
ID: Penny Kim   DOB: [birth year]  MR#: [id number]  CSN#:[id number]  PCP:  Orville Govern, NP GYN: [facility] SU: Emelia Loron, MD OTHER MD: Etter Sjogren, MD; Donnelly Stager; Milagros Evener, MD  CHIEF COMPLAINT:  Right Breast Cancer   HISTORY OF PRESENT ILLNESS: Penny Kim had noticed that her right breast dropped a little differently than the left, and occasionally she would have a strange sensation radiating to the right nipple, but she really did not pay much attention to this.  She had her first mammogram ever, a screening mammogram at [facility], 04/23/2010, and this showed diffuse calcifications in the lower inner quadrant of the right breast measuring up to 5.6 cm.  She was recalled 08/08, and Dr. Samuella Cota found the breast to be dense, which limits mammographic sensitivity, but again was able to demonstrate these calcifications throughout the lower inner quadrant of the right breast.  Biopsy was discussed, and performed 08/09, and this showed [phone] ductal carcinoma in situ, high grade, which was estrogen receptor 100% and progesterone receptor 57% positive.  Bilateral breast MRIs were obtained 08/14.  This showed in the right breast the area of enhancement to total 7.7 cm anterior posteriorly.  Most of this was nodular and non-mass like, but in addition in the anterior aspect of this, there was a rounded area of mass-like enhancement measuring 1.7 cm.  This portion was suspicious for invasive ductal carcinoma.  The left breast was unremarkable, and there were no suspicious internal mammary or axillary lymph nodes noted.  Accordingly the patient was brought back for biopsy of the more mass-like area on 05/05/2010, and this biopsy ([id number]) showed a grade 2 invasive ductal carcinoma, which was estrogen receptor at 78% and progesterone receptor 82% positive.  The proliferation marker was elevated at 85%.  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 Her  subsequent history is as detailed below.  INTERVAL HISTORY: Penny Kim returns for routine followup of her right breast cancer. She continues on tamoxifen which she is tolerating well. She has some occasional hot flashes which she does not find particularly problematic. She's had some mild vaginal dryness, but denies any vaginal bleeding. She recently saw her ophthalmologist who recommended that she use reading glasses. She's had no additional changes in vision.   Interval history is notable for Penny Kim having started a new job. 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 She loves her job, and is very happy that she is now "self-sufficient".   REVIEW OF SYSTEMS: Physically, Penny Kim is feeling well. She does have some general fatigue which is chronic. She still has some postsurgical pain in the breasts, but this is stable. She also has chronic anxiety and depression which she feels is well-controlled at the current time.  She continues to be followed by both Dr. Noe Gens and Dr. Evelene Croon.  Penny Kim denies any recent illnesses and has had no fevers, chills, or night sweats. She's had no skin changes and denies any abnormal bruising or bleeding. She has occasional nausea but has had no emesis, and denies any change in bowel habits. She's had no new cough, increased shortness of breath, chest pain, or palpitations.  She's had no abnormal headaches or dizziness. She denies any unusual myalgias, arthralgias, or bony pain, other than some occasional back pain which has been a chronic issue on and off. She tells me it is intermittent, and stable. She's had no peripheral swelling.  A detailed review of systems is otherwise noncontributory.   PAST MEDICAL HISTORY: Past Medical History  Diagnosis Date  . Anxiety   . Breast cancer, right breast 02/2010    s/p chemo (completed 09/2010) and right mastectomy w/reconstruction  . Depression   . HTN (hypertension)      PAST SURGICAL HISTORY: Past Surgical History  Procedure Laterality Date  . Port-a-cath removal  11/26/2010  . Tissue expander removal  10/20/2010    left  . Portacath placement  07/23/2010  . Insertion of tissue expander after mastectomy  06/19/2010    bilat. mastectomies with bilat. tissue expanders  . Breast surgery  06/19/2010    exploration of right mastectomy site due to post-op bleeding  . Breast reconstruction  01/06/2012    Procedure: BREAST RECONSTRUCTION;  Surgeon: Etter Sjogren, MD;  Location: [facility];  Service: Plastics;  Laterality: Bilateral;  bilateral removal of tissue expanders, bilateral placement of implants--Breast  Depression.  This has been present from the patient's early 20s.  She has never been hospitalized for this.  When she looks back on the antidepressants that she has used, she did best with Zoloft, but she tells me she had to discontinue that because of reflux issues.  She is followed by Dr. Janeece Riggers in [place] (clinic number [phone]), and he has recently changed her from Remeron to Pristiq.  However, the Pristiq is $50 a month, and Carle has not been able to afford it.  A second problem is borderline glucose intolerance (there is a strong family history of diabetes).  FAMILY HISTORY Family History  Problem Relation Age of Onset  . Hypertension Mother   . Depression Mother   . Cancer Sister 30    Ovarian cancer  . Hypertension Maternal Grandmother   . Diabetes Maternal Grandmother   . Cancer Maternal Grandfather     Lung cancer - Education officer, environmental and smoker  . Hypertension Maternal Grandfather   . Diabetes Paternal Grandmother   . Hypertension Paternal Grandmother   . Kidney disease Paternal Grandmother     End stage renal dz - dialysis  . Hyperlipidemia Paternal Grandmother   . Alcohol abuse Father   . Depression Father   . Drug abuse Father 32    Died of drug overdose  The patient's father died at the age of 15 from drug overdose in the  setting of ETOH abuse.  The patient's mother is alive.  She has a significant history of depression.  The patient has one sister, also with a history of depression.  GYNECOLOGIC HISTORY:  She is GX, P0.  She stopped having periods with chemotherapy. They have not resumed.  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  When asked who she would call in case of a problem, she really does not have anyone that she would call.  She does not feel her family is supportive, and she has no close friends.     ADVANCED DIRECTIVES:  HEALTH MAINTENANCE: History  Substance Use Topics  . Smoking status: Never Smoker   . Smokeless tobacco: Never Used  . Alcohol Use: 0.0 oz/week     Comment: occasionally     Colonoscopy:  PAP:  Bone density:  Lipid panel:  Allergies  Allergen Reactions  . Prochlorperazine Other (See Comments)    SYNCOPE  . Methadone Hcl Itching  . Tape Rash    Current Outpatient  Prescriptions  Medication Sig Dispense Refill  . ascorbic acid (VITAMIN C) 500 MG tablet Take 500 mg by mouth daily.      . CHLOROPHYLL PO Take 60 mg by mouth daily.      . clonazePAM (KLONOPIN) 1 MG tablet Take 1 mg by mouth 2 (two) times daily.      . fish oil-omega-3 fatty acids 1000 MG capsule Take 1,200 g by mouth daily.      . hydrochlorothiazide (HYDRODIURIL) 25 MG tablet Take 1/2 tablet daily in the morning.  30 tablet  6  . Multiple Vitamin (MULTIVITAMIN) tablet Take 1 tablet by mouth daily.      . sertraline (ZOLOFT) 100 MG tablet Take 100 mg by mouth daily.      . tamoxifen (NOLVADEX) 20 MG tablet Take 1 tablet (20 mg total) by mouth daily.  90 tablet  1   No current facility-administered medications for this visit.    OBJECTIVE: Young-appearing African American woman in no acute distress Filed Vitals:   08/07/13 1105  BP: 132/86  Pulse: 73  Temp: 98 F (36.7 C)   Resp: 18     Body mass index is 24.89 kg/(m^2).    ECOG FS: 0 Filed Weights   08/07/13 1105  Weight: 145 lb 1.6 oz (65.817 kg)   Physical Exam: HEENT:  Sclerae anicteric.  Oropharynx clear. Buccal mucosa is pink and moist. NODES:  No cervical or supraclavicular lymphadenopathy palpated.  BREAST EXAM:  Status post bilateral mastectomies with implant reconstruction. No suspicious nodularity or skin changes, and no evidence of local recurrence. Axillae are benign bilaterally, with no palpable lymphadenopathy. LUNGS:  Clear to auscultation bilaterally.  No wheezes or rhonchi HEART:  Regular rate and rhythm. No murmur  ABDOMEN:  Soft, nontender to palpation.  Positive bowel sounds.  MSK:  No focal spinal tenderness to palpation. Good range of motion in the upper extremities. EXTREMITIES:  No peripheral edema.   NEURO:  Nonfocal. Well oriented.  Appropriate affect.    LAB RESULTS: Lab Results  Component Value Date   WBC 3.6* 07/31/2013   NEUTROABS 1.7 07/31/2013   HGB 12.6 07/31/2013   HCT 38.1 07/31/2013   MCV 85.4 07/31/2013   PLT 172 07/31/2013      Chemistry      Component Value Date/Time   NA 140 07/31/2013 1052   NA 141 05/02/2012 1212   K 4.0 07/31/2013 1052   K 3.9 05/02/2012 1212   CL 105 01/30/2013 1313   CL 108 05/02/2012 1212   CO2 24 07/31/2013 1052   CO2 25 05/02/2012 1212   BUN 10.4 07/31/2013 1052   BUN 9 05/02/2012 1212   CREATININE 0.8 07/31/2013 1052   CREATININE 0.87 05/02/2012 1212      Component Value Date/Time   CALCIUM 9.7 07/31/2013 1052   CALCIUM 9.3 05/02/2012 1212   ALKPHOS 42 07/31/2013 1052   ALKPHOS 36* 05/02/2012 1212   AST 28 07/31/2013 1052   AST 24 05/02/2012 1212   ALT 20 07/31/2013 1052   ALT 17 05/02/2012 1212   BILITOT 0.37 07/31/2013 1052   BILITOT 0.3 05/02/2012 1212       STUDIES: Most recent chest x-ray on 10/25/2011 was unremarkable.   ASSESSMENT: 42 y.o. 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    (3) She did not require radiation.   (4)  She started tamoxifen in March 2012.     PLAN:  Penny Kim is doing well with regards to her breast cancer, with no clinical evidence of disease recurrence at this time. She is tolerating the tamoxifen well, and will continue on her current regimen. The plan is to continue for at least 5 years, and possibly 10.  Penny Kim is due for a routine pelvic exam, and will be referred back to [facility] where she has been seen in the past. Otherwise, she will return here in 6 months for routine followup including labs and physical exam. We will obtain a screening chest x-ray just prior to that visit to assess for any signs of disease recurrence.  Of note, she is currently being followed by Orville Govern, NP for routine care. Raquel recently ordered several labs for routine screening, and they were hoping that these could be drawn through our office while Asha was here. We will obtain these today as she leaves.   Patient voices understanding and agreement with this plan, and knows to call with any changes or problems prior to her next appointment.  Penny Escalona PA-C     08/07/2013

## 2013-08-08 ENCOUNTER — Ambulatory Visit (INDEPENDENT_AMBULATORY_CARE_PROVIDER_SITE_OTHER): Payer: Self-pay | Admitting: Psychiatry

## 2013-08-08 DIAGNOSIS — F0630 Mood disorder due to known physiological condition, unspecified: Secondary | ICD-10-CM

## 2013-08-08 DIAGNOSIS — F331 Major depressive disorder, recurrent, moderate: Secondary | ICD-10-CM

## 2013-08-08 LAB — VITAMIN D 25 HYDROXY (VIT D DEFICIENCY, FRACTURES): Vit D, 25-Hydroxy: 31 ng/mL (ref 30–89)

## 2013-08-08 LAB — LIPID PANEL: Total CHOL/HDL Ratio: 3.5 Ratio

## 2013-08-08 LAB — VITAMIN B12: Vitamin B-12: 636 pg/mL (ref 211–911)

## 2013-08-22 ENCOUNTER — Ambulatory Visit: Payer: Self-pay | Admitting: Psychiatry

## 2013-08-28 ENCOUNTER — Ambulatory Visit (INDEPENDENT_AMBULATORY_CARE_PROVIDER_SITE_OTHER): Payer: Self-pay | Admitting: Psychiatry

## 2013-08-28 DIAGNOSIS — F331 Major depressive disorder, recurrent, moderate: Secondary | ICD-10-CM

## 2013-08-28 DIAGNOSIS — F0630 Mood disorder due to known physiological condition, unspecified: Secondary | ICD-10-CM

## 2013-09-11 ENCOUNTER — Ambulatory Visit (INDEPENDENT_AMBULATORY_CARE_PROVIDER_SITE_OTHER): Payer: Self-pay | Admitting: Psychiatry

## 2013-09-11 DIAGNOSIS — F331 Major depressive disorder, recurrent, moderate: Secondary | ICD-10-CM

## 2013-09-11 DIAGNOSIS — F0630 Mood disorder due to known physiological condition, unspecified: Secondary | ICD-10-CM

## 2013-10-03 ENCOUNTER — Ambulatory Visit: Payer: BC Managed Care – PPO | Admitting: Psychiatry

## 2013-10-17 ENCOUNTER — Ambulatory Visit: Payer: BC Managed Care – PPO | Admitting: Psychiatry

## 2013-10-22 ENCOUNTER — Encounter: Payer: Self-pay | Admitting: Adult Health

## 2013-10-22 ENCOUNTER — Ambulatory Visit (INDEPENDENT_AMBULATORY_CARE_PROVIDER_SITE_OTHER): Payer: BC Managed Care – PPO | Admitting: Adult Health

## 2013-10-22 VITALS — BP 118/68 | HR 80 | Resp 12 | Wt 152.0 lb

## 2013-10-22 DIAGNOSIS — I10 Essential (primary) hypertension: Secondary | ICD-10-CM

## 2013-10-22 DIAGNOSIS — C50911 Malignant neoplasm of unspecified site of right female breast: Secondary | ICD-10-CM

## 2013-10-22 DIAGNOSIS — C50919 Malignant neoplasm of unspecified site of unspecified female breast: Secondary | ICD-10-CM

## 2013-10-22 DIAGNOSIS — Z79899 Other long term (current) drug therapy: Secondary | ICD-10-CM

## 2013-10-22 NOTE — Progress Notes (Signed)
   Subjective:    Patient ID: Penny Kim, female    DOB: [birth year], 43 y.o.   MRN: [id number]  HPI  Pt is a pleasant 43 y/o female who presents to clinic for f/u HTN. She was started on HCTZ in November. Reports tolerating medication well. Was taking at bedtime even though it was making her get up to urinate often. She is now taking it in the morning.  Current Outpatient Prescriptions on File Prior to Visit  Medication Sig Dispense Refill  . ascorbic acid (VITAMIN C) 500 MG tablet Take 500 mg by mouth daily.      . CHLOROPHYLL PO Take 60 mg by mouth daily.      . clonazePAM (KLONOPIN) 1 MG tablet Take 1 mg by mouth 2 (two) times daily.      . fish oil-omega-3 fatty acids 1000 MG capsule Take 1,200 g by mouth daily.      . hydrochlorothiazide (HYDRODIURIL) 25 MG tablet Take 1/2 tablet daily in the morning.  30 tablet  6  . Multiple Vitamin (MULTIVITAMIN) tablet Take 1 tablet by mouth daily.      . sertraline (ZOLOFT) 100 MG tablet Take 100 mg by mouth daily.      . tamoxifen (NOLVADEX) 20 MG tablet Take 1 tablet (20 mg total) by mouth daily.  90 tablet  1   No current facility-administered medications on file prior to visit.     Review of Systems  Constitutional: Negative.   Respiratory: Negative.   Cardiovascular: Negative.   Neurological: Negative.   Psychiatric/Behavioral: Negative.   All other systems reviewed and are negative.       Objective:   Physical Exam  Constitutional: She is oriented to person, place, and time.  HENT:  Head: Normocephalic and atraumatic.  Cardiovascular: Normal rate, regular rhythm, normal heart sounds and intact distal pulses.  Exam reveals no gallop.   No murmur heard. Pulmonary/Chest: Effort normal and breath sounds normal. No respiratory distress. She has no wheezes. She has no rales.  Musculoskeletal: Normal range of motion.  Neurological: She is alert and oriented to person, place, and time.  Skin: Skin is warm and dry.  Psychiatric:  She has a normal mood and affect. Her behavior is normal. Judgment and thought content normal.          Assessment & Plan:

## 2013-10-22 NOTE — Progress Notes (Signed)
Pre visit review using our clinic review tool, if applicable. No additional management support is needed unless otherwise documented below in the visit note. 

## 2013-10-22 NOTE — Assessment & Plan Note (Signed)
Doing well on HCTZ. B/P is well controlled. Check bmet. Continue to follow.

## 2013-10-22 NOTE — Patient Instructions (Signed)
  Blood pressure is doing well.   Please have your labs drawn prior to leaving. I will send you the results through your MyChart once I review them.  Try Nasonex 2 sprays into each nostril daily.  Irrigate sinuses with saline spray. Do this as often as you like.  Gargle to help dislodge the tonsil stones. When they sit for extended periods of time they cause bad odor.  See you back in November for your physical exam or sooner if necessary.

## 2013-10-22 NOTE — Addendum Note (Signed)
Addended by: Ronaldo Miyamoto on: 10/22/2013 03:51 PM   Modules accepted: Orders

## 2013-10-23 ENCOUNTER — Encounter: Payer: Self-pay | Admitting: Adult Health

## 2013-10-23 ENCOUNTER — Telehealth: Payer: Self-pay | Admitting: Adult Health

## 2013-10-23 LAB — CBC WITH DIFFERENTIAL/PLATELET
Basophils Absolute: 0 10*3/uL (ref 0.0–0.1)
Basophils Relative: 0.5 % (ref 0.0–3.0)
EOS PCT: 1.1 % (ref 0.0–5.0)
Eosinophils Absolute: 0 10*3/uL (ref 0.0–0.7)
HEMATOCRIT: 39.7 % (ref 36.0–46.0)
Hemoglobin: 13.1 g/dL (ref 12.0–15.0)
LYMPHS ABS: 1.9 10*3/uL (ref 0.7–4.0)
Lymphocytes Relative: 43.6 % (ref 12.0–46.0)
MCHC: 32.9 g/dL (ref 30.0–36.0)
MCV: 87.9 fl (ref 78.0–100.0)
MONO ABS: 0.3 10*3/uL (ref 0.1–1.0)
Monocytes Relative: 7.7 % (ref 3.0–12.0)
Neutro Abs: 2 10*3/uL (ref 1.4–7.7)
Neutrophils Relative %: 47.1 % (ref 43.0–77.0)
Platelets: 190 10*3/uL (ref 150.0–400.0)
RBC: 4.52 Mil/uL (ref 3.87–5.11)
RDW: 14.1 % (ref 11.5–14.6)
WBC: 4.3 10*3/uL — AB (ref 4.5–10.5)

## 2013-10-23 LAB — BASIC METABOLIC PANEL
BUN: 12 mg/dL (ref 6–23)
CO2: 23 meq/L (ref 19–32)
CREATININE: 0.9 mg/dL (ref 0.4–1.2)
Calcium: 9.6 mg/dL (ref 8.4–10.5)
Chloride: 105 mEq/L (ref 96–112)
GFR: 91.5 mL/min (ref >60.00)
Glucose, Bld: 88 mg/dL (ref 70–99)
POTASSIUM: 4.2 meq/L (ref 3.5–5.1)
Sodium: 136 mEq/L (ref 135–145)

## 2013-10-23 NOTE — Telephone Encounter (Signed)
Relevant patient education assigned to patient using Emmi. ° °

## 2013-10-25 NOTE — Telephone Encounter (Signed)
Mailed unread message to pt  

## 2013-10-31 ENCOUNTER — Ambulatory Visit: Payer: BC Managed Care – PPO | Admitting: Psychiatry

## 2013-11-01 ENCOUNTER — Ambulatory Visit (INDEPENDENT_AMBULATORY_CARE_PROVIDER_SITE_OTHER): Payer: BC Managed Care – PPO | Admitting: Psychiatry

## 2013-11-01 DIAGNOSIS — F331 Major depressive disorder, recurrent, moderate: Secondary | ICD-10-CM

## 2013-11-01 DIAGNOSIS — F0630 Mood disorder due to known physiological condition, unspecified: Secondary | ICD-10-CM

## 2013-11-13 ENCOUNTER — Ambulatory Visit: Payer: BC Managed Care – PPO | Admitting: Psychiatry

## 2013-11-16 ENCOUNTER — Other Ambulatory Visit: Payer: Self-pay | Admitting: Physician Assistant

## 2013-11-16 DIAGNOSIS — C50911 Malignant neoplasm of unspecified site of right female breast: Secondary | ICD-10-CM

## 2013-11-20 ENCOUNTER — Other Ambulatory Visit: Payer: Self-pay

## 2013-11-27 ENCOUNTER — Ambulatory Visit: Payer: BC Managed Care – PPO | Admitting: Psychiatry

## 2013-11-29 ENCOUNTER — Ambulatory Visit (INDEPENDENT_AMBULATORY_CARE_PROVIDER_SITE_OTHER): Payer: BC Managed Care – PPO | Admitting: Psychiatry

## 2013-11-29 DIAGNOSIS — F331 Major depressive disorder, recurrent, moderate: Secondary | ICD-10-CM

## 2013-11-29 DIAGNOSIS — F0630 Mood disorder due to known physiological condition, unspecified: Secondary | ICD-10-CM

## 2013-12-12 ENCOUNTER — Ambulatory Visit (INDEPENDENT_AMBULATORY_CARE_PROVIDER_SITE_OTHER): Payer: BC Managed Care – PPO | Admitting: Psychiatry

## 2013-12-12 DIAGNOSIS — F0630 Mood disorder due to known physiological condition, unspecified: Secondary | ICD-10-CM

## 2013-12-12 DIAGNOSIS — F331 Major depressive disorder, recurrent, moderate: Secondary | ICD-10-CM

## 2013-12-17 ENCOUNTER — Telehealth: Payer: Self-pay | Admitting: Oncology

## 2013-12-17 NOTE — Telephone Encounter (Signed)
per 3.29.15 pof moved pt appt to AB done

## 2013-12-26 ENCOUNTER — Ambulatory Visit (INDEPENDENT_AMBULATORY_CARE_PROVIDER_SITE_OTHER): Payer: BC Managed Care – PPO | Admitting: Psychiatry

## 2013-12-26 DIAGNOSIS — F0630 Mood disorder due to known physiological condition, unspecified: Secondary | ICD-10-CM

## 2013-12-26 DIAGNOSIS — F331 Major depressive disorder, recurrent, moderate: Secondary | ICD-10-CM

## 2014-01-10 ENCOUNTER — Ambulatory Visit: Payer: BC Managed Care – PPO | Admitting: Psychiatry

## 2014-02-13 ENCOUNTER — Ambulatory Visit (INDEPENDENT_AMBULATORY_CARE_PROVIDER_SITE_OTHER): Payer: BC Managed Care – PPO | Admitting: Psychiatry

## 2014-02-13 DIAGNOSIS — F331 Major depressive disorder, recurrent, moderate: Secondary | ICD-10-CM

## 2014-02-13 DIAGNOSIS — F0630 Mood disorder due to known physiological condition, unspecified: Secondary | ICD-10-CM

## 2014-02-21 ENCOUNTER — Ambulatory Visit: Payer: BC Managed Care – PPO | Admitting: Psychiatry

## 2014-03-04 ENCOUNTER — Other Ambulatory Visit: Payer: Self-pay

## 2014-03-04 ENCOUNTER — Ambulatory Visit (HOSPITAL_COMMUNITY): Admission: RE | Admit: 2014-03-04 | Payer: Self-pay | Source: Ambulatory Visit

## 2014-03-07 ENCOUNTER — Ambulatory Visit: Payer: BC Managed Care – PPO | Admitting: Psychiatry

## 2014-03-11 ENCOUNTER — Ambulatory Visit (HOSPITAL_BASED_OUTPATIENT_CLINIC_OR_DEPARTMENT_OTHER): Payer: BC Managed Care – PPO | Admitting: Physician Assistant

## 2014-03-11 ENCOUNTER — Telehealth: Payer: Self-pay | Admitting: Physician Assistant

## 2014-03-11 ENCOUNTER — Encounter: Payer: Self-pay | Admitting: Physician Assistant

## 2014-03-11 ENCOUNTER — Encounter: Payer: Self-pay | Admitting: Oncology

## 2014-03-11 VITALS — BP 131/82 | HR 77 | Temp 97.5°F | Resp 20 | Ht 64.0 in | Wt 150.7 lb

## 2014-03-11 DIAGNOSIS — M549 Dorsalgia, unspecified: Secondary | ICD-10-CM

## 2014-03-11 DIAGNOSIS — R140 Abdominal distension (gaseous): Secondary | ICD-10-CM | POA: Insufficient documentation

## 2014-03-11 DIAGNOSIS — Z170 Estrogen receptor positive status [ER+]: Secondary | ICD-10-CM

## 2014-03-11 DIAGNOSIS — R05 Cough: Secondary | ICD-10-CM

## 2014-03-11 DIAGNOSIS — F3289 Other specified depressive episodes: Secondary | ICD-10-CM

## 2014-03-11 DIAGNOSIS — C50911 Malignant neoplasm of unspecified site of right female breast: Secondary | ICD-10-CM

## 2014-03-11 DIAGNOSIS — R059 Cough, unspecified: Secondary | ICD-10-CM | POA: Insufficient documentation

## 2014-03-11 DIAGNOSIS — C50319 Malignant neoplasm of lower-inner quadrant of unspecified female breast: Secondary | ICD-10-CM

## 2014-03-11 DIAGNOSIS — F329 Major depressive disorder, single episode, unspecified: Secondary | ICD-10-CM

## 2014-03-11 DIAGNOSIS — Z853 Personal history of malignant neoplasm of breast: Secondary | ICD-10-CM

## 2014-03-11 DIAGNOSIS — R109 Unspecified abdominal pain: Secondary | ICD-10-CM | POA: Insufficient documentation

## 2014-03-11 DIAGNOSIS — K5900 Constipation, unspecified: Secondary | ICD-10-CM

## 2014-03-11 DIAGNOSIS — R102 Pelvic and perineal pain: Secondary | ICD-10-CM

## 2014-03-11 NOTE — Telephone Encounter (Signed)
per pof to sch pt for appt-adv [facility] will call & sch CT-pof to have pt to add on lab-lab is closed-pt stated will come in Thurs for labs-sch and gave pt copy of sch

## 2014-03-11 NOTE — Progress Notes (Signed)
ID: [name]   DOB: 1970/11/27  MR#: [id number]  CSN#:[id number]  PCP:  Penny Forward, Penny Kim GYN: [facility] SU: Rolm Bookbinder, MD OTHER MD: Crissie Reese, MD; Ovidio Hanger; Chucky May, MD  CHIEF COMPLAINT:  Hx of Right Breast Cancer (tamoxifen)   HISTORY OF PRESENT ILLNESS: Penny Kim had noticed that her right breast dropped a little differently than the left, and occasionally she would have a strange sensation radiating to the right nipple, but she really did not pay much attention to this.  She had her first mammogram ever, a screening mammogram at [facility], 04/23/2010, and this showed diffuse calcifications in the lower inner quadrant of the right breast measuring up to 5.6 cm.  She was recalled 08/08, and Dr. March Rummage found the breast to be dense, which limits mammographic sensitivity, but again was able to demonstrate these calcifications throughout the lower inner quadrant of the right breast.  Biopsy was discussed, and performed 08/09, and this showed [phone]) ductal carcinoma in situ, high grade, which was estrogen receptor 100% and progesterone receptor 57% positive.  Bilateral breast MRIs were obtained 08/14.  This showed in the right breast the area of enhancement to total 7.7 cm anterior posteriorly.  Most of this was nodular and non-mass like, but in addition in the anterior aspect of this, there was a rounded area of mass-like enhancement measuring 1.7 cm.  This portion was suspicious for invasive ductal carcinoma.  The left breast was unremarkable, and there were no suspicious internal mammary or axillary lymph nodes noted.  Accordingly the patient was brought back for biopsy of the more mass-like area on 05/05/2010, and this biopsy ([id number]) showed a grade 2 invasive ductal carcinoma, which was estrogen receptor at 78% and progesterone receptor 82% positive.  The proliferation marker was elevated at 85%.  The HER-2 ratio was 1.67, even though there was evidence of  polysomy.   Her subsequent history is as detailed below.  INTERVAL HISTORY: Penny Kim returns alone today  for routine followup of her right breast cancer. She does have several concerns to discuss today, and these are all detailed below.  Interval history is notable for the fact that Penny Kim fell on the ice last February 2015. She has had some resulting back pain which is being followed by a chiropractor, and she continues to see the chiropractor twice weekly. She does feel like the back pain has improved. Notable, however, is the fact that since the fall, she has also had increased abdominal pain, and feels like this has worsened over the past couple of months. She has also felt bloated and "distended". She has had an increased problem with constipation, and this was never a problem in the past. In addition,  she resumed her menstrual cycle following the fall. Prior to that time, she had not had a period since November of 2011. She began her period, however, the day after her fall in February 2015. She then had a normal cycle in March and May. Most recent menstrual cycle was 02/11/2014. She tells me she has seen her gynecologist and had a complete GYN evaluation which was unremarkable.  Penny Kim continues on tamoxifen which she tolerates reasonably well. She has had fewer hot flashes, perhaps because she has resumed her menstrual cycle. She denies any blood clots. She's had no peripheral swelling. She denies any abnormal headaches or change in vision. She's had no vaginal dryness or discharge.  REVIEW OF SYSTEMS: Penny Kim denies any recent illnesses and has had no fevers, chills, or  night sweats. Her energy level is fair. Her depression seems to be well-controlled at the current time, and she continues to be followed by both Dr. Ferdinand Lango and Dr. Toy Care. She's had no skin changes or rashes. She denies any abnormal bruising or bleeding. Her appetite is fair. She has no nausea or emesis. She denies any change in urinary  habits, specifically no dysuria or hematuria. She does have a cough which has worsened. It is dry, nonproductive, and she denies hemoptysis. She's had no significant increase in shortness of breath, and also denies any chest pain or palpitations. She's had no abnormal headaches, dizziness, or change in vision.  A detailed review of systems is otherwise stable and noncontributory.    PAST MEDICAL HISTORY: Past Medical History  Diagnosis Date  . Anxiety   . Breast cancer, right breast 02/2010    s/p chemo (completed 09/2010) and right mastectomy w/reconstruction  . Depression   . HTN (hypertension)   . Cancer   . Allergy     PAST SURGICAL HISTORY: Past Surgical History  Procedure Laterality Date  . Port-a-cath removal  11/26/2010  . Tissue expander removal  10/20/2010    left  . Portacath placement  07/23/2010  . Insertion of tissue expander after mastectomy  06/19/2010    bilat. mastectomies with bilat. tissue expanders  . Breast surgery  06/19/2010    exploration of right mastectomy site due to post-op bleeding  . Breast reconstruction  01/06/2012    Procedure: BREAST RECONSTRUCTION;  Surgeon: Crissie Reese, MD;  Location: [place];  Service: Plastics;  Laterality: Bilateral;  bilateral removal of tissue expanders, bilateral placement of implants--Breast  Depression.  This has been present from the patient's early 20s.  She has never been hospitalized for this.  When she looks back on the antidepressants that she has used, she did best with Zoloft, but she tells me she had to discontinue that because of reflux issues.  She is followed by Dr. Kasandra Knudsen in [place] (clinic number [phone]), and he has recently changed her from Remeron to [place].  However, the Pristiq is $50 a month, and Penny Kim has not been able to afford it.  A second problem is borderline glucose intolerance (there is a strong family history of diabetes).  FAMILY HISTORY Family History  Problem Relation Age of  Onset  . Hypertension Mother   . Depression Mother   . Cancer Sister 30    Ovarian cancer  . Hypertension Maternal Grandmother   . Diabetes Maternal Grandmother   . 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 Hypertension Maternal Grandfather   . Diabetes Paternal Grandmother   . Hypertension Paternal Grandmother   . Kidney disease Paternal Grandmother     End stage renal dz - dialysis  . Hyperlipidemia Paternal Grandmother   . Alcohol abuse Father   . Depression Father   . Drug abuse Father 75    Died of drug overdose  The patient's father died at the age of 65 from drug overdose in the setting of ETOH abuse.  The patient's mother is alive.  She has a significant history of depression.  The patient has one sister, also with a history of depression.  GYNECOLOGIC HISTORY:   (Updated 03/11/2014) She is GX, P0.  She stopped having periods with chemotherapy in approximately November 2011. These resumed following a fall in February 2015, and has been somewhat regular since that time. Last menstrual cycle was  02/11/2014. 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  When asked who she would call in case of a problem, she really does not have anyone that she would call.  She does not feel her family is supportive, and she has no close friends.     ADVANCED DIRECTIVES:  HEALTH MAINTENANCE:  (Updated 03/11/2014) History  Substance Use Topics  . Smoking status: Never Smoker   . Smokeless tobacco: Never Used  . Alcohol Use: 0.0 oz/week     Comment: occasionally     Colonoscopy: Never  PAP: March 2015  Bone density: Never  Lipid panel:  November 2014  Allergies  Allergen Reactions  . Prochlorperazine Other  (See Comments)    SYNCOPE  . Methadone Hcl Itching  . Tape Rash    Current Outpatient Prescriptions  Medication Sig Dispense Refill  . ascorbic acid (VITAMIN C) 500 MG tablet Take 500 mg by mouth daily.      . CHLOROPHYLL PO Take 60 mg by mouth daily.      . clonazePAM (KLONOPIN) 1 MG tablet Take 1 mg by mouth 2 (two) times daily.      . fish oil-omega-3 fatty acids 1000 MG capsule Take 1,200 g by mouth daily.      . hydrochlorothiazide (HYDRODIURIL) 25 MG tablet Take 1/2 tablet daily in the morning.  30 tablet  6  . Multiple Vitamin (MULTIVITAMIN) tablet Take 1 tablet by mouth daily.      . sertraline (ZOLOFT) 100 MG tablet Take 100 mg by mouth daily.      . tamoxifen (NOLVADEX) 20 MG tablet TAKE ONE TABLET BY MOUTH DAILY  90 tablet  1   No current facility-administered medications for this visit.    OBJECTIVE: Young-appearing African American woman in no acute distress Filed Vitals:   03/11/14 1512  BP: 131/82  Pulse: 77  Temp: 97.5 F (36.4 C)  Resp: 20     Body mass index is 25.85 kg/(m^2).    ECOG FS: 0 Filed Weights   03/11/14 1512  Weight: 150 lb 11.2 oz (68.357 kg)   Physical Exam: HEENT:  Sclerae anicteric.  Oropharynx clear. Buccal mucosa is pink and moist. Neck supple, trachea midline. No thyromegaly palpated. NODES:  No cervical or supraclavicular lymphadenopathy palpated.  BREAST EXAM:  Status post bilateral mastectomies with implant reconstruction. No suspicious nodularity or skin changes, and no evidence of local recurrence. Axillae are benign bilaterally, with no palpable lymphadenopathy. LUNGS:  Clear to auscultation bilaterally with fair excursion.  No crackles, wheezes or rhonchi HEART:  Regular rate and rhythm. No murmur appreciated. ABDOMEN:  Soft, mildly distended. Diffuse tenderness to abdominal palpation. No organomegaly or masses palpated. Positive and normoactive bowel sounds.  MSK:  No focal spinal tenderness to palpation. Good range of motion in the  upper extremities. EXTREMITIES:  No peripheral edema.  No lymphedema in the right upper extremity. NEURO:  Nonfocal. Well oriented.  Appropriate affect.    LAB RESULTS: Lab Results  Component Value Date   WBC 4.3* 10/22/2013   NEUTROABS 2.0 10/22/2013   HGB 13.1 10/22/2013   HCT 39.7 10/22/2013   MCV 87.9 10/22/2013   PLT 190.0 10/22/2013      Chemistry      Component Value Date/Time  NA 136 10/22/2013 1552   NA 140 07/31/2013 1052   K 4.2 10/22/2013 1552   K 4.0 07/31/2013 1052   CL 105 10/22/2013 1552   CL 105 01/30/2013 1313   CO2 23 10/22/2013 1552   CO2 24 07/31/2013 1052   BUN 12 10/22/2013 1552   BUN 10.4 07/31/2013 1052   CREATININE 0.9 10/22/2013 1552   CREATININE 0.8 07/31/2013 1052      Component Value Date/Time   CALCIUM 9.6 10/22/2013 1552   CALCIUM 9.7 07/31/2013 1052   ALKPHOS 42 07/31/2013 1052   ALKPHOS 36* 05/02/2012 1212   AST 28 07/31/2013 1052   AST 24 05/02/2012 1212   ALT 20 07/31/2013 1052   ALT 17 05/02/2012 1212   BILITOT 0.37 07/31/2013 1052   BILITOT 0.3 05/02/2012 1212       STUDIES: Most recent chest x-ray on 10/25/2011 was unremarkable.   ASSESSMENT: 43 y.o. [place] woman with  a BRCA 2 mutation of uncertain significance:  (1)  status post bilateral mastectomies September 2011 for a right-sided T2 N0, stage IIA invasive ductal carcinoma, grade 3, estrogen and progesterone receptor positive, HER2 negative with an elevated MIB-1,   (2)  treated with 4 cycles of adjuvant doxorubicin/cyclophosphamide given in dose-dense fashion and 4 weekly doses of paclitaxel of 12 planned, discontinued because of neuropathy.    (3) She did not require radiation.   (4)  She started tamoxifen in March 2012.     PLAN:  The majority of our 25 minute appointment today was spent reviewing the patient's recent medical history, addressing her concerns, discussing her treatment plan, and coordinating care.  Penny Kim is tolerating the tamoxifen well thus far. I'm making no  changes in her current treatment plan, and our plan is to continue the tamoxifen for at least 5 years, and possibly 10.  I am concerned about the abdominal/pelvic pain and distention Penny Kim has been experiencing, and she is also worried about the chronic cough. She has had no staging studies other than a chest x-ray since her original imaging studies in October 2011. I think it would be most prudent to investigate these issues more thoroughly with CTs of the chest, abdomen, and pelvis for complete restaging. If there are any abnormalities, at that point we can move Kim with a PET scan for further evaluation.  Penny Kim herself is very much in favor with this plan.   Otherwise, Penny Kim will continue to be followed by [facility]  as well as her primary care provider, Penny Forward, Penny Kim.  She will see [place] again in 6 months for repeat labs and physical exam, but she knows to call prior that time with any changes or problems.    BERRY,AMY PA-C     03/11/2014

## 2014-03-14 ENCOUNTER — Other Ambulatory Visit (HOSPITAL_BASED_OUTPATIENT_CLINIC_OR_DEPARTMENT_OTHER): Payer: BC Managed Care – PPO

## 2014-03-14 ENCOUNTER — Ambulatory Visit (HOSPITAL_COMMUNITY)
Admission: RE | Admit: 2014-03-14 | Discharge: 2014-03-14 | Disposition: A | Discharge status: Home / Self Care | Payer: BC Managed Care – PPO | Source: Ambulatory Visit | Attending: Oncology | Admitting: Oncology

## 2014-03-14 ENCOUNTER — Encounter (HOSPITAL_COMMUNITY): Payer: Self-pay

## 2014-03-14 DIAGNOSIS — R109 Unspecified abdominal pain: Secondary | ICD-10-CM

## 2014-03-14 DIAGNOSIS — R059 Cough, unspecified: Secondary | ICD-10-CM | POA: Insufficient documentation

## 2014-03-14 DIAGNOSIS — R05 Cough: Secondary | ICD-10-CM

## 2014-03-14 DIAGNOSIS — R140 Abdominal distension (gaseous): Secondary | ICD-10-CM

## 2014-03-14 DIAGNOSIS — R102 Pelvic and perineal pain: Secondary | ICD-10-CM

## 2014-03-14 DIAGNOSIS — C50319 Malignant neoplasm of lower-inner quadrant of unspecified female breast: Secondary | ICD-10-CM

## 2014-03-14 DIAGNOSIS — Z9010 Acquired absence of unspecified breast and nipple: Secondary | ICD-10-CM | POA: Insufficient documentation

## 2014-03-14 DIAGNOSIS — Z853 Personal history of malignant neoplasm of breast: Secondary | ICD-10-CM

## 2014-03-14 DIAGNOSIS — C50919 Malignant neoplasm of unspecified site of unspecified female breast: Secondary | ICD-10-CM | POA: Insufficient documentation

## 2014-03-14 LAB — COMPREHENSIVE METABOLIC PANEL (CC13)
ALBUMIN: 4.1 g/dL (ref 3.5–5.0)
ALT: 19 U/L (ref 0–55)
AST: 31 U/L (ref 5–34)
Alkaline Phosphatase: 30 U/L — ABNORMAL LOW (ref 40–150)
Anion Gap: 12 mEq/L — ABNORMAL HIGH (ref 3–11)
BUN: 10.9 mg/dL (ref 7.0–26.0)
CALCIUM: 9.5 mg/dL (ref 8.4–10.4)
CO2: 26 mEq/L (ref 22–29)
Chloride: 101 mEq/L (ref 98–109)
Creatinine: 1 mg/dL (ref 0.6–1.1)
GLUCOSE: 91 mg/dL (ref 70–140)
POTASSIUM: 3.2 meq/L — AB (ref 3.5–5.1)
Sodium: 139 mEq/L (ref 136–145)
Total Bilirubin: 0.3 mg/dL (ref 0.20–1.20)
Total Protein: 7.5 g/dL (ref 6.4–8.3)

## 2014-03-14 LAB — CBC WITH DIFFERENTIAL/PLATELET
BASO%: 1.2 % (ref 0.0–2.0)
Basophils Absolute: 0.1 10*3/uL (ref 0.0–0.1)
EOS ABS: 0.1 10*3/uL (ref 0.0–0.5)
EOS%: 2.2 % (ref 0.0–7.0)
HCT: 36.6 % (ref 34.8–46.6)
HGB: 12.2 g/dL (ref 11.6–15.9)
LYMPH%: 52 % — AB (ref 14.0–49.7)
MCH: 28.3 pg (ref 25.1–34.0)
MCHC: 33.5 g/dL (ref 31.5–36.0)
MCV: 84.7 fL (ref 79.5–101.0)
MONO#: 0.4 10*3/uL (ref 0.1–0.9)
MONO%: 9.5 % (ref 0.0–14.0)
NEUT%: 35.1 % — AB (ref 38.4–76.8)
NEUTROS ABS: 1.5 10*3/uL (ref 1.5–6.5)
PLATELETS: 222 10*3/uL (ref 145–400)
RBC: 4.32 10*6/uL (ref 3.70–5.45)
RDW: 13.4 % (ref 11.2–14.5)
WBC: 4.3 10*3/uL (ref 3.9–10.3)
lymph#: 2.2 10*3/uL (ref 0.9–3.3)

## 2014-03-14 MED ORDER — IOHEXOL 300 MG/ML  SOLN
100.0000 mL | Freq: Once | INTRAMUSCULAR | Status: AC | PRN
  Administered 2014-03-14: 100 mL via INTRAVENOUS

## 2014-03-15 ENCOUNTER — Telehealth: Payer: Self-pay | Admitting: Physician Assistant

## 2014-03-15 ENCOUNTER — Other Ambulatory Visit: Payer: Self-pay | Admitting: Physician Assistant

## 2014-03-15 ENCOUNTER — Telehealth: Payer: Self-pay | Admitting: *Deleted

## 2014-03-15 DIAGNOSIS — E876 Hypokalemia: Secondary | ICD-10-CM

## 2014-03-15 DIAGNOSIS — C50911 Malignant neoplasm of unspecified site of right female breast: Secondary | ICD-10-CM

## 2014-03-15 MED ORDER — POTASSIUM CHLORIDE CRYS ER 20 MEQ PO TBCR: 20.0000 meq | EXTENDED_RELEASE_TABLET | Freq: Every day | ORAL | Status: DC

## 2014-03-15 NOTE — Telephone Encounter (Signed)
Called pt to inform her of labs results concerning Potassium level(3.2L). I told pt a Rx for K+ has been called in to her pharmacy. She is to take 1- 20 mEq tablet by mouth daily. Also I gave her the results of the CT she had done and told her everything looked normal, no signs of any cancer or mets. Pt was very pleased about the results. Pt verbalized understanding and said she will pick up her medicine. Message to be forwarded to [employer], PA-C.

## 2014-03-15 NOTE — Telephone Encounter (Signed)
S/W PT GAVE APPT FOR LAB ONLY (PER 6/26 POF) FO 7/28 @ 4PM.

## 2014-04-04 ENCOUNTER — Ambulatory Visit: Payer: BC Managed Care – PPO | Admitting: Psychiatry

## 2014-04-16 ENCOUNTER — Other Ambulatory Visit (HOSPITAL_BASED_OUTPATIENT_CLINIC_OR_DEPARTMENT_OTHER): Payer: BC Managed Care – PPO

## 2014-04-16 DIAGNOSIS — E876 Hypokalemia: Secondary | ICD-10-CM

## 2014-04-16 DIAGNOSIS — C50319 Malignant neoplasm of lower-inner quadrant of unspecified female breast: Secondary | ICD-10-CM

## 2014-04-16 DIAGNOSIS — C50911 Malignant neoplasm of unspecified site of right female breast: Secondary | ICD-10-CM

## 2014-04-16 LAB — BASIC METABOLIC PANEL (CC13)
Anion Gap: 11 mEq/L (ref 3–11)
BUN: 7.8 mg/dL (ref 7.0–26.0)
CO2: 28 mEq/L (ref 22–29)
Calcium: 9.2 mg/dL (ref 8.4–10.4)
Chloride: 103 mEq/L (ref 98–109)
Creatinine: 1 mg/dL (ref 0.6–1.1)
Glucose: 92 mg/dl (ref 70–140)
Potassium: 3.3 mEq/L — ABNORMAL LOW (ref 3.5–5.1)
Sodium: 141 mEq/L (ref 136–145)

## 2014-06-27 ENCOUNTER — Other Ambulatory Visit: Payer: Self-pay | Admitting: *Deleted

## 2014-06-27 DIAGNOSIS — C50911 Malignant neoplasm of unspecified site of right female breast: Secondary | ICD-10-CM

## 2014-06-27 MED ORDER — TAMOXIFEN CITRATE 20 MG PO TABS: ORAL_TABLET | ORAL | Status: DC

## 2014-08-01 ENCOUNTER — Encounter: Payer: Self-pay | Admitting: Adult Health

## 2014-08-13 ENCOUNTER — Encounter: Payer: Self-pay | Admitting: Nurse Practitioner

## 2014-08-13 ENCOUNTER — Ambulatory Visit (INDEPENDENT_AMBULATORY_CARE_PROVIDER_SITE_OTHER): Payer: BC Managed Care – PPO | Admitting: Nurse Practitioner

## 2014-08-13 VITALS — BP 132/100 | HR 63 | Temp 98.4°F | Resp 16 | Ht 64.0 in | Wt 135.5 lb

## 2014-08-13 DIAGNOSIS — I10 Essential (primary) hypertension: Secondary | ICD-10-CM

## 2014-08-13 DIAGNOSIS — Z0000 Encounter for general adult medical examination without abnormal findings: Secondary | ICD-10-CM

## 2014-08-13 DIAGNOSIS — R030 Elevated blood-pressure reading, without diagnosis of hypertension: Secondary | ICD-10-CM

## 2014-08-13 DIAGNOSIS — M25551 Pain in right hip: Secondary | ICD-10-CM

## 2014-08-13 NOTE — Progress Notes (Signed)
Pre-visit discussion using our clinic review tool. No additional management support is needed unless otherwise documented below in the visit note.  

## 2014-08-13 NOTE — Assessment & Plan Note (Signed)
Uncontrolled. Right hip/lower back pain. Unable to pin point etiology due to mixed symptoms. Referred to Sports Medicine for evaluation and treatment since insurance will not pay for anymore chiropractor visits this year.

## 2014-08-13 NOTE — Progress Notes (Signed)
Subjective:    Patient ID: Penny Kim, female    DOB: [birth year], 43 y.o.   MRN: [id number]  HPI  Penny Kim is a 43 yo female here for her Annual wellness exam.   CC: Anxiety/Depression  HPI: Pt had a Double Mastectomy: Oct. 2011 for breast cancer. She is tearful today and has not done her normal routine of yoga, meditation, and tea to help with her anxiety/depression. This routine helps her more than the SSRI she reports. She feels that she is anxious about dating and others seeing her mastectomies. She feels that her womanhood is taken away.   1) Right hip/back pain- Patient describes a fall on icy stairs at her apt. In 2/15. She had no head trauma or immediate injury after bouncing on her backside down the stairs. She did not see anyone for a few months and then saw a Chiropractor for help. Dr. Freddi Che. Her insurance will not pay for more visits at this time.  Onset- Feb. 2015 Location- Pain in anterior hip, groin area, and buttock.  Duration- Intermittent With movement and certain motions only Characteristics- Aching, shooting pains into anterior thigh, and posterior thigh to foot occasionally. Treatment to date: Yoga- helpful  2) She would like me to note that after chemo right foot has peeling skin. No issues at this time.  3) Health Maintenance-  Vaginal bleeding in May 2015, reported everything was okay. [facility]ed [facility]. Taking Tamoxifen 4th year.  Eye Exam- Not up to date Dental- Up to date, Dr. Barnie Alderman DDS 06/2014 Flu Shot- 06/2014 [facility]   4)BP- Elevated today, had not taken medication. Took HCTZ in room during exam.    Review of Systems  Constitutional: Negative for fever, chills, diaphoresis, fatigue and unexpected weight change.  HENT: Positive for sinus pressure and sore throat.   Eyes: Negative for visual disturbance.  Respiratory: Negative for cough and chest tightness.   Cardiovascular: Negative for chest pain, palpitations and  leg swelling.  Gastrointestinal: Negative for nausea, vomiting, abdominal pain, diarrhea, constipation and blood in stool.  Genitourinary: Negative for dysuria and pelvic pain.  Musculoskeletal: Positive for back pain, arthralgias and neck pain. Negative for myalgias.  Skin: Negative for rash.  Neurological: Negative for headaches.  Hematological: Bruises/bleeds easily.       After chemo  Psychiatric/Behavioral: Negative for suicidal ideas.       Positive for Anxiety/Depression   Past Medical History  Diagnosis Date  . Anxiety   . Breast cancer, right breast 02/2010    s/p chemo (completed 09/2010) and right mastectomy w/reconstruction  . Depression   . HTN (hypertension)   . Cancer   . Allergy     History   Social History  . Marital Status: Single    Spouse Name: N/A    Number of Children: N/A  . Years of Education: 24   Occupational History  . Glass blower/designer   Social History Main Topics  . Smoking status: Never Smoker   . Smokeless tobacco: Never Used  . Alcohol Use: 0.6 oz/week    1 Glasses of wine per week     Comment: occasionally  . Drug Use: No  . Sexual Activity: Not Currently    Birth Control/ Protection: None   Other Topics Concern  . Not on file   Social History Narrative   Penny Kim [place], [place]. She attended [employer] where she obtained her Therapist, nutritional degree in Psychology. 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 She currently lives in [place]. She lives alone. Penny Kim enjoys attending live musical performances. 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     Past Surgical History  Procedure Laterality Date  . Port-a-cath removal  11/26/2010  . Tissue expander removal  10/20/2010    left  . Portacath placement  07/23/2010  . Insertion of tissue expander after mastectomy  06/19/2010     bilat. mastectomies with bilat. tissue expanders  . Breast surgery  06/19/2010    exploration of right mastectomy site due to post-op bleeding  . Breast reconstruction  01/06/2012    Procedure: BREAST RECONSTRUCTION;  Surgeon: Crissie Reese, MD;  Location: [place];  Service: Plastics;  Laterality: Bilateral;  bilateral removal of tissue expanders, bilateral placement of implants--Breast    Family History  Problem Relation Age of Onset  . Hypertension Mother   . Depression Mother   . Cancer Sister 30    Ovarian cancer  . Hypertension Maternal Grandmother   . Diabetes Maternal Grandmother   . 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 Hypertension Maternal Grandfather   . Diabetes Paternal Grandmother   . Hypertension Paternal Grandmother   . Kidney disease Paternal Grandmother     End stage renal dz - dialysis  . Hyperlipidemia Paternal Grandmother   . Alcohol abuse Father   . Depression Father   . Drug abuse Father 28    Died of drug overdose    Allergies  Allergen Reactions  . Prochlorperazine Other (See Comments)    SYNCOPE  . Methadone Hcl Itching  . Tape Rash    Current Outpatient Prescriptions on File Prior to Visit  Medication Sig Dispense Refill  . clonazePAM (KLONOPIN) 1 MG tablet Take 1 mg by mouth 2 (two) times daily.    . hydrochlorothiazide (HYDRODIURIL) 25 MG tablet Take 1/2 tablet daily in the morning. 30 tablet 6  . Multiple Vitamin (MULTIVITAMIN) tablet Take 1 tablet by mouth daily.    . tamoxifen (NOLVADEX) 20 MG tablet TAKE ONE TABLET BY MOUTH DAILY 90 tablet 1  . ascorbic acid (VITAMIN C) 500 MG tablet Take 500 mg by mouth daily.    . CHLOROPHYLL PO Take 60 mg by mouth daily.    . fish oil-omega-3 fatty acids 1000 MG capsule Take 1,200 g by mouth daily.    . potassium chloride SA (K-DUR,KLOR-CON) 20 MEQ tablet Take 1 tablet (20 mEq total) by mouth daily. (Patient not taking: Reported on 08/13/2014) 30 tablet  1  . sertraline (ZOLOFT) 100 MG tablet Take 100 mg by mouth daily.     No current facility-administered medications on file prior to visit.    BP 132/100 mmHg  Pulse 63  Temp(Src) 98.4 F (36.9 C) (Oral)  Resp 16  Ht 5\' 4"  (1.626 m)  Wt 135 lb 8 oz (61.462 kg)  BMI 23.25 kg/m2  SpO2 98%  LMP 02/10/2014     Objective:   Physical Exam  Constitutional: She is oriented to person, place, and time. She appears well-nourished. No distress.  HENT:  Head: Normocephalic and atraumatic.  Right Ear: External ear normal.  Left Ear: External ear normal.  Mouth/Throat: Oropharynx is clear and moist.  TM's clear  Eyes: Conjunctivae and EOM are normal. Pupils are equal, round, and reactive  to light. Right eye exhibits no discharge. Left eye exhibits no discharge. No scleral icterus.  Neck: Normal range of motion. Neck supple. No thyromegaly present.  Cardiovascular: Normal rate, regular rhythm and normal heart sounds.   Pulmonary/Chest: Effort normal and breath sounds normal. No respiratory distress. She has no wheezes. She has no rales. She exhibits no tenderness.  Breast reconstruction bilaterally post-mastectomies.  Abdominal: Soft. Bowel sounds are normal. She exhibits no distension and no mass. There is no tenderness. There is no rebound and no guarding.  Musculoskeletal: Normal range of motion. She exhibits tenderness. She exhibits no edema.  Right hip tender to palpation and ROM  Lymphadenopathy:    She has no cervical adenopathy.  Neurological: She is alert and oriented to person, place, and time. No cranial nerve deficit.  Negative straight leg test R & L  Skin: Skin is warm and dry. No rash noted. She is not diaphoretic.  Skin tag at neck anterior near midline.  Psychiatric: Her behavior is normal. Judgment and thought content normal.  Tearful today.          Assessment & Plan:  Annual comprehensive exam was done excluding PAP and breast exam- deferred to OB/GYN and Oncology  post- bilateral mastectomies. Vaccine status reviewed.  Seatbelt , alcohol, tobacco and illicit drug use reviewed. FH reviewed and updated. Screening for domestic violence done.

## 2014-08-13 NOTE — Patient Instructions (Signed)
Take Ibuprofen OTC for inflammation twice daily for 2 weeks.  Use ice on hip no longer than 15 minutes at a time, place towel between ice and skin. Please call us if there are any worsening symptoms.

## 2014-08-13 NOTE — Assessment & Plan Note (Signed)
Uncontrolled. Had not taken BP at home. BP elevated in office, no medication was taken until during visit at the end. Will continue to follow.

## 2014-08-19 ENCOUNTER — Other Ambulatory Visit: Payer: Self-pay | Admitting: *Deleted

## 2014-08-19 MED ORDER — HYDROCHLOROTHIAZIDE 25 MG PO TABS: ORAL_TABLET | ORAL | Status: DC

## 2014-09-03 ENCOUNTER — Encounter: Payer: Self-pay | Admitting: Family Medicine

## 2014-09-03 ENCOUNTER — Other Ambulatory Visit (INDEPENDENT_AMBULATORY_CARE_PROVIDER_SITE_OTHER): Payer: BC Managed Care – PPO

## 2014-09-03 ENCOUNTER — Ambulatory Visit (INDEPENDENT_AMBULATORY_CARE_PROVIDER_SITE_OTHER)
Admission: RE | Admit: 2014-09-03 | Discharge: 2014-09-03 | Disposition: A | Discharge status: Home / Self Care | Payer: BC Managed Care – PPO | Source: Ambulatory Visit | Attending: Family Medicine | Admitting: Family Medicine

## 2014-09-03 ENCOUNTER — Ambulatory Visit (INDEPENDENT_AMBULATORY_CARE_PROVIDER_SITE_OTHER): Payer: BC Managed Care – PPO | Admitting: Family Medicine

## 2014-09-03 VITALS — BP 122/74 | HR 72 | Ht 64.5 in | Wt 134.0 lb

## 2014-09-03 DIAGNOSIS — M25551 Pain in right hip: Secondary | ICD-10-CM

## 2014-09-03 MED ORDER — ALLOPURINOL 100 MG PO TABS: 100.0000 mg | ORAL_TABLET | Freq: Every day | ORAL | Status: DC

## 2014-09-03 NOTE — Assessment & Plan Note (Signed)
Patient's right hip pain seemed to be exacerbated by a fall. Patient continues to have the pain though. Patient was taking chemotherapy drugs and does have a family history of gout. On ultrasound today. 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 Patient also has some old changes and I would like an x-ray to further evaluate. With patient's history of cancer as well as again would be a good idea for imaging of the back. We discussed icing regimen, home exercises, and topical anti-inflammatories were given. We may need to consider changing patient's hydrochlorothiazide in the long run to another medication secondary to the possibility of increasing her uric acid. Patient was started on allopurinol. We will monitor and patient should have blood work in next 2 weeks. Patient also gives history of potential avascular necrosis from a younger sister who needed replacement at the age of 80. We will monitor closely. Patient will come back again in 2 weeks for further evaluation. Depending on findings of the x-ray and response to the allopurinol further evaluation and management will be done with potential injection of the hip.

## 2014-09-03 NOTE — Patient Instructions (Signed)
Nice to meet you Ice 20 minutes 2 times daily. Usually after activity and before bed.  Exercises 3 times a week.   I would like to get xrays today.  Allopurinol 1 pill daily.  Iron daily 325mg  daily Vitamin D 2000 IU daily consider compression sleeve to thigh.  Good shoes with rigid bottom.  Jalene Mullet, Merrell or New balance greater then 700 [employer]en[employer] orthotics online look for total support ($30) See me again in 3 weeks.    Gout Gout is an inflammatory arthritis caused by a buildup of uric acid crystals in the joints. Uric acid is a chemical that is normally present in the blood. When the level of uric acid in the blood is too high it can form crystals that deposit in your joints and tissues. This causes joint redness, soreness, and swelling (inflammation). Repeat attacks are common. Over time, uric acid crystals can form into masses (tophi) near a joint, destroying bone and causing disfigurement. Gout is treatable and often preventable. CAUSES  The disease begins with elevated levels of uric acid in the blood. Uric acid is produced by your body when it breaks down a naturally found substance called purines. Certain foods you eat, such as meats and fish, contain high amounts of purines. Causes of an elevated uric acid level include:  Being passed down from parent to child (heredity).  Diseases that cause increased uric acid production (such as obesity, psoriasis, and certain cancers).  Excessive alcohol use.  Diet, especially diets rich in meat and seafood.  Medicines, including certain cancer-fighting medicines (chemotherapy), water pills (diuretics), and aspirin.  Chronic kidney disease. The kidneys are no longer able to remove uric acid well.  Problems with metabolism. Conditions strongly associated with gout include:  Obesity.  High blood pressure.  High cholesterol.  Diabetes. Not everyone with elevated uric acid levels gets gout. It is not understood why some people get  gout and others do not. Surgery, joint injury, and eating too much of certain foods are some of the factors that can lead to gout attacks. SYMPTOMS   An attack of gout comes on quickly. It causes intense pain with redness, swelling, and warmth in a joint.  Fever can occur.  Often, only one joint is involved. Certain joints are more commonly involved:  Base of the big toe.  Knee.  Ankle.  Wrist.  Finger. Without treatment, an attack usually goes away in a few days to weeks. Between attacks, you usually will not have symptoms, which is different from many other forms of arthritis. DIAGNOSIS  Your caregiver will suspect gout based on your symptoms and exam. In some cases, tests may be recommended. The tests may include:  Blood tests.  Urine tests.  X-rays.  Joint fluid exam. This exam requires a needle to remove fluid from the joint (arthrocentesis). Using a microscope, gout is confirmed when uric acid crystals are seen in the joint fluid. TREATMENT  There are two phases to gout treatment: treating the sudden onset (acute) attack and preventing attacks (prophylaxis).  Treatment of an Acute Attack.  Medicines are used. These include anti-inflammatory medicines or steroid medicines.  An injection of steroid medicine into the affected joint is sometimes necessary.  The painful joint is rested. Movement can worsen the arthritis.  You may use warm or cold treatments on painful joints, depending which works best for you.  Treatment to Prevent Attacks.  If you suffer from frequent gout attacks, your caregiver may advise preventive medicine. These medicines are  started after the acute attack subsides. These medicines either help your kidneys eliminate uric acid from your body or decrease your uric acid production. You may need to stay on these medicines for a very long time.  The early phase of treatment with preventive medicine can be associated with an increase in acute gout  attacks. For this reason, during the first few months of treatment, your caregiver may also advise you to take medicines usually used for acute gout treatment. Be sure you understand your caregiver's directions. Your caregiver may make several adjustments to your medicine dose before these medicines are effective.  Discuss dietary treatment with your caregiver or dietitian. Alcohol and drinks high in sugar and fructose and foods such as meat, poultry, and seafood can increase uric acid levels. Your caregiver or dietitian can advise you on drinks and foods that should be limited. HOME CARE INSTRUCTIONS   Do not take aspirin to relieve pain. This raises uric acid levels.  Only take over-the-counter or prescription medicines for pain, discomfort, or fever as directed by your caregiver.  Rest the joint as much as possible. When in bed, keep sheets and blankets off painful areas.  Keep the affected joint raised (elevated).  Apply warm or cold treatments to painful joints. Use of warm or cold treatments depends on which works best for you.  Use crutches if the painful joint is in your leg.  Drink enough fluids to keep your urine clear or pale yellow. This helps your body get rid of uric acid. Limit alcohol, sugary drinks, and fructose drinks.  Follow your dietary instructions. Pay careful attention to the amount of protein you eat. Your daily diet should emphasize fruits, vegetables, whole grains, and fat-free or low-fat milk products. Discuss the use of coffee, vitamin C, and cherries with your caregiver or dietitian. These may be helpful in lowering uric acid levels.  Maintain a healthy body weight. SEEK MEDICAL CARE IF:   You develop diarrhea, vomiting, or any side effects from medicines.  You do not feel better in 24 hours, or you are getting worse. SEEK IMMEDIATE MEDICAL CARE IF:   Your joint becomes suddenly more tender, and you have chills or a fever. MAKE SURE YOU:   Understand  these instructions.  Will watch your condition.  Will get help right away if you are not doing well or get worse. Document Released: 09/03/2000 Document Revised: 01/21/2014 Document Reviewed: 04/19/2012 [facility] Patient Information 2015 [place], [place]. This information is not intended to replace advice given to you by your health care provider. Make sure you discuss any questions you have with your health care provider.

## 2014-09-03 NOTE — Progress Notes (Signed)
[name] [address], [place] [address] Phone: [phone] Subjective:    I'm seeing this patient by the request  of:  Doss, Velora Heckler, NP   CC: right hip pain  ATF:[id number] Penny Kim is a 43 y.o. female coming in with complaint of right hip pain. Patient states she did fall on icy stairs on February 15.patient's states that she bounced down numerous stairs. Patient did see a chiropractor and that seem to be beneficial but then started to not have insurance pain for the coverage. Patient states most of the pain seems to be anterior hip as well as groin area but can radiate toward the buttocks. Patient states that this pain is more intermittent and worse with movements and seems to be better with rest. Describes the pain as more of an aching pain that does shoot to the anterior thigh. Sometimes can shoot down to the foot but this seems to be very minimal. Denies any weakness.patient does have a past medical history significant for breast cancer. Patient puts the severity of pain as 6 out of 10. Denies any nighttime awakening.denies any fevers chills or any abnormal weight loss.patient's past family history is significant for gout as well as hip replacement in her younger sister at the age of 36 but she does not know why.     Past medical history, social, surgical and family history all reviewed in electronic medical record.   Review of Systems: No headache, visual changes, nausea, vomiting, diarrhea, constipation, dizziness, abdominal pain, skin rash, fevers, chills, night sweats, weight loss, swollen lymph nodes, body aches, joint swelling, muscle aches, chest pain, shortness of breath, mood changes.   Objective Blood pressure 122/74, pulse 72, height 5' 4.5" (1.638 m), weight 134 lb (60.782 kg), last menstrual period 02/10/2014, SpO2 98 %.  General: No apparent distress alert and oriented x3 mood and affect normal, dressed appropriately.  HEENT:  Pupils equal, extraocular movements intact  Respiratory: Patient's speak in full sentences and does not appear short of breath  Cardiovascular: No lower extremity edema, non tender, no erythema  Skin: Warm dry intact with no signs of infection or rash on extremities or on axial skeleton.  Abdomen: Soft nontender  Neuro: Cranial nerves II through XII are intact, neurovascularly intact in all extremities with 2+ DTRs and 2+ pulses.  Lymph: No lymphadenopathy of posterior or anterior cervical chain or axillae bilaterally.  Gait normal with good balance and coordination.  MSK:  Non tender with full range of motion and good stability and symmetric strength and tone of shoulders, elbows, wrist,  knee and ankles bilaterally.  HCW:CBJSE ROM IR: 25 Degwith pain with internal rotation, ER: 45 Degpainful groin pain, Flexion: 120 Deg, Extension: 100 Deg, Abduction: 45 Deg, Adduction: 45 Deg Strength IR: 5/5, ER: 5/5, Flexion: 5/5, Extension: 5/5, Abduction: 4/5, Adduction: 5/5 Pelvic alignment unremarkable to inspection and palpation. Standing hip rotation and gait without trendelenburg sign / unsteadiness. Greater trochanter without tenderness to palpation. No tenderness over piriformis and greater trochanter. significant pain with FABER andFADIR. Mild SI joint pain bilaterally   MSK US performed of: right hip This study was ordered, performed, and interpreted by Charlann Boxer D.O.  Hip: Trochanteric bursa without swelling or effusion. Acetabular labrum  Significant inflammation noted. 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 Patient does have narrowing of the femoral jointsignificant nondescript changes of the joint noted. Femoral  neck appears unremarkable without increased power doppler signal along Cortex.  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     Impression and Recommendations:       This case required medical decision making of moderate complexity.

## 2014-09-10 ENCOUNTER — Telehealth: Payer: Self-pay | Admitting: Oncology

## 2014-09-10 NOTE — Telephone Encounter (Signed)
pt cld & left vm-cld pt back and left message-pt left no reason for call-adv to call us back @ [phone]

## 2014-09-11 ENCOUNTER — Telehealth: Payer: Self-pay | Admitting: Oncology

## 2014-09-11 NOTE — Telephone Encounter (Signed)
pt cld to r/s appt time on 1/4-gave pt updated time for appt-pt understood

## 2014-09-19 ENCOUNTER — Encounter: Payer: Self-pay | Admitting: *Deleted

## 2014-09-19 ENCOUNTER — Ambulatory Visit (INDEPENDENT_AMBULATORY_CARE_PROVIDER_SITE_OTHER): Payer: BC Managed Care – PPO | Admitting: Family Medicine

## 2014-09-19 ENCOUNTER — Encounter: Payer: Self-pay | Admitting: Family Medicine

## 2014-09-19 VITALS — BP 118/82 | HR 61 | Temp 97.6°F | Ht 64.0 in | Wt 133.1 lb

## 2014-09-19 DIAGNOSIS — M9903 Segmental and somatic dysfunction of lumbar region: Secondary | ICD-10-CM

## 2014-09-19 DIAGNOSIS — M25551 Pain in right hip: Secondary | ICD-10-CM

## 2014-09-19 DIAGNOSIS — M9905 Segmental and somatic dysfunction of pelvic region: Secondary | ICD-10-CM

## 2014-09-19 DIAGNOSIS — M999 Biomechanical lesion, unspecified: Secondary | ICD-10-CM

## 2014-09-19 DIAGNOSIS — M9902 Segmental and somatic dysfunction of thoracic region: Secondary | ICD-10-CM

## 2014-09-19 NOTE — Patient Instructions (Addendum)
Good to se eyou Increase the allopurinol to 200mg  daily.  Add B12 1072mcg daily.  Continue the exercises at least 3 times a week. Love the idea of yoga.  2 weeks of seated work.  See me againin 2 weeks for manipulation and we will discuss injection.

## 2014-09-19 NOTE — Assessment & Plan Note (Signed)
Patient did have manipulation today with some resolution of pain. I put patient on light duty at work to see if this will be beneficial. We increase patient's allopurinol to 200 mg daily. Patient will come back again in 2 weeks. At that time I think an ultrasound of the hip would be beneficial. We discussed the possibility of an injection in the hip but patient's family history of a younger sister at the age of 18 needing hip replacement is concerning for possible avascular necrosis. Patient's x-rays do not show any early signs this. If patient continues to have pain we may want to consider early imaging before injection. We may also need to discussed changing patient's hydrochlorothiazide. Patient will continue with the conservative therapy and see me again in 2 weeks.  Spent greater than 25 minutes with patient face-to-face and had greater than 50% of counseling including as described above in assessment and plan.

## 2014-09-19 NOTE — Progress Notes (Signed)
Pre visit review using our clinic review tool, if applicable. No additional management support is needed unless otherwise documented below in the visit note. 

## 2014-09-19 NOTE — Assessment & Plan Note (Signed)
Decision today to treat with OMT was based on Physical Exam  After verbal consent patient was treated with HVLA, ME techniques in thoracic, lumbar, sacrum and ilium areas  Patient tolerated the procedure well with improvement in symptoms  Patient given exercises, stretches and lifestyle modifications  See medications in patient instructions if given  Patient will follow up in 2 weeks

## 2014-09-19 NOTE — Progress Notes (Signed)
  [facility] [address], [place] [address] Phone: [phone] Subjective:    CC: right hip pain  ONG:[id number] Penny Kim is a 43 y.o. female coming in with complaint of right hip pain. Patient states she did fall on icy stairs on February 15.patient's states that she bounced down numerous stairs.  Please see previous note for everything else patient has tried. Patient was seen and it appeared the patient may have had gouty deposits within the hip. Patient does have a history of breast cancer status post chemotherapy and has been in 4 years of remission. Patient continues to takes tamoxifen. Patient was put on allopurinol and given a home exercise program. Patient states on the weekends this seems to get better and then with which she does at work she has increasing pain. Patient states more walking seems to be agitated knee area. Patient was getting some improvement with pain with a chiropractor. Patient is wondering if we can try manipulation.    Past medical history, social, surgical and family history all reviewed in electronic medical record.   Review of Systems: No headache, visual changes, nausea, vomiting, diarrhea, constipation, dizziness, abdominal pain, skin rash, fevers, chills, night sweats, weight loss, swollen lymph nodes, body aches, joint swelling, muscle aches, chest pain, shortness of breath, mood changes.   Objective Blood pressure 118/82, pulse 61, temperature 97.6 F (36.4 C), temperature source Oral, height 5\' 4"  (1.626 m), weight 133 lb 2 oz (60.385 kg), last menstrual period 08/09/2010, SpO2 97 %.  General: No apparent distress alert and oriented x3 mood and affect normal, dressed appropriately.  HEENT: Pupils equal, extraocular movements intact  Respiratory: Patient's speak in full sentences and does not appear short of breath  Cardiovascular: No lower extremity edema, non tender, no erythema  Skin: Warm dry intact with no  signs of infection or rash on extremities or on axial skeleton.  Abdomen: Soft nontender  Neuro: Cranial nerves II through XII are intact, neurovascularly intact in all extremities with 2+ DTRs and 2+ pulses.  Lymph: No lymphadenopathy of posterior or anterior cervical chain or axillae bilaterally.  Gait normal with good balance and coordination.  MSK:  Non tender with full range of motion and good stability and symmetric strength and tone of shoulders, elbows, wrist,  knee and ankles bilaterally.  MWN:UUVOZ ROM IR: 25 Degwith pain with internal rotation, ER: 45 Degpainful groin pain, Flexion: 120 Deg, Extension: 100 Deg, Abduction: 45 Deg, Adduction: 45 Deg Strength IR: 5/5, ER: 5/5, Flexion: 5/5, Extension: 5/5, Abduction: 4/5, Adduction: 5/5 Pelvic alignment unremarkable to inspection and palpation. Standing hip rotation and gait without trendelenburg sign / unsteadiness. Greater trochanter without tenderness to palpation. No tenderness over piriformis and greater trochanter. Continued pain with FABER andFADIR less than previous exam. Mild SI joint pain bilaterally   Osteopathic findings Cervical C4 flexed rotated and side bent right  Thoracic T3 extended rotated and side bent left  Lumbar L2 flexed rotated and side bent right  Sacrum Left on left  Ileum Right anterior     Impression and Recommendations:     This case required medical decision making of moderate complexity.

## 2014-09-23 ENCOUNTER — Other Ambulatory Visit: Payer: Self-pay

## 2014-09-27 ENCOUNTER — Ambulatory Visit (HOSPITAL_BASED_OUTPATIENT_CLINIC_OR_DEPARTMENT_OTHER): Payer: Self-pay

## 2014-09-27 DIAGNOSIS — Z853 Personal history of malignant neoplasm of breast: Secondary | ICD-10-CM

## 2014-09-27 DIAGNOSIS — C50319 Malignant neoplasm of lower-inner quadrant of unspecified female breast: Secondary | ICD-10-CM

## 2014-09-27 LAB — CBC WITH DIFFERENTIAL/PLATELET
BASO%: 0.5 % (ref 0.0–2.0)
Basophils Absolute: 0 10*3/uL (ref 0.0–0.1)
EOS%: 1 % (ref 0.0–7.0)
Eosinophils Absolute: 0 10*3/uL (ref 0.0–0.5)
HCT: 38.2 % (ref 34.8–46.6)
HGB: 12.6 g/dL (ref 11.6–15.9)
LYMPH%: 44.3 % (ref 14.0–49.7)
MCH: 27.3 pg (ref 25.1–34.0)
MCHC: 33 g/dL (ref 31.5–36.0)
MCV: 82.7 fL (ref 79.5–101.0)
MONO#: 0.4 10*3/uL (ref 0.1–0.9)
MONO%: 10.3 % (ref 0.0–14.0)
NEUT%: 43.9 % (ref 38.4–76.8)
NEUTROS ABS: 1.7 10*3/uL (ref 1.5–6.5)
PLATELETS: 197 10*3/uL (ref 145–400)
RBC: 4.62 10*6/uL (ref 3.70–5.45)
RDW: 14.2 % (ref 11.2–14.5)
WBC: 4 10*3/uL (ref 3.9–10.3)
lymph#: 1.8 10*3/uL (ref 0.9–3.3)

## 2014-09-27 LAB — COMPREHENSIVE METABOLIC PANEL (CC13)
ALBUMIN: 4.1 g/dL (ref 3.5–5.0)
ALK PHOS: 38 U/L — AB (ref 40–150)
ALT: 13 U/L (ref 0–55)
AST: 23 U/L (ref 5–34)
Anion Gap: 8 mEq/L (ref 3–11)
BUN: 10.7 mg/dL (ref 7.0–26.0)
CHLORIDE: 102 meq/L (ref 98–109)
CO2: 31 mEq/L — ABNORMAL HIGH (ref 22–29)
Calcium: 9.6 mg/dL (ref 8.4–10.4)
Creatinine: 0.9 mg/dL (ref 0.6–1.1)
GLUCOSE: 94 mg/dL (ref 70–140)
Potassium: 3.3 mEq/L — ABNORMAL LOW (ref 3.5–5.1)
SODIUM: 141 meq/L (ref 136–145)
Total Bilirubin: 0.4 mg/dL (ref 0.20–1.20)
Total Protein: 7.4 g/dL (ref 6.4–8.3)

## 2014-09-30 ENCOUNTER — Ambulatory Visit (HOSPITAL_BASED_OUTPATIENT_CLINIC_OR_DEPARTMENT_OTHER): Payer: BLUE CROSS/BLUE SHIELD | Admitting: Oncology

## 2014-09-30 ENCOUNTER — Telehealth: Payer: Self-pay | Admitting: Oncology

## 2014-09-30 VITALS — BP 122/77 | HR 80 | Temp 98.6°F | Resp 18 | Ht 64.0 in | Wt 130.5 lb

## 2014-09-30 DIAGNOSIS — F32A Depression, unspecified: Secondary | ICD-10-CM

## 2014-09-30 DIAGNOSIS — F329 Major depressive disorder, single episode, unspecified: Secondary | ICD-10-CM

## 2014-09-30 DIAGNOSIS — C50911 Malignant neoplasm of unspecified site of right female breast: Secondary | ICD-10-CM

## 2014-09-30 DIAGNOSIS — Z170 Estrogen receptor positive status [ER+]: Secondary | ICD-10-CM

## 2014-09-30 DIAGNOSIS — I10 Essential (primary) hypertension: Secondary | ICD-10-CM

## 2014-09-30 NOTE — Telephone Encounter (Signed)
per pof to sch pt appt-gave pt copy of sch °

## 2014-09-30 NOTE — Progress Notes (Signed)
ID: Penny Kim   DOB: 1971-01-06  MR#: [id number]  DQQ#:[id number]  PCP:  Charolette Forward, NP GYN: [facility] SU: Rolm Bookbinder, MD OTHER MD: Crissie Reese, MD; Ovidio Hanger; Chucky May, MD, Hulan Saas M.D.,  CHIEF COMPLAINT:  Hx of Right Breast Cancer  CURRENT TREATMENT: Tamoxifen   HISTORY OF PRESENT ILLNESS: From the original intake note:  Penny Kim had noticed that her right breast dropped a little differently than the left, and occasionally she would have a strange sensation radiating to the right nipple, but she really did not pay much attention to this.  She had her first mammogram ever, a screening mammogram at [facility], 04/23/2010, and this showed diffuse calcifications in the lower inner quadrant of the right breast measuring up to 5.6 cm.  She was recalled 08/08, and Dr. March Rummage found the breast to be dense, which limits mammographic sensitivity, but again was able to demonstrate these calcifications throughout the lower inner quadrant of the right breast.  Biopsy was discussed, and performed 08/09, and this showed [phone]) ductal carcinoma in situ, high grade, which was estrogen receptor 100% and progesterone receptor 57% positive.  Bilateral breast MRIs were obtained 08/14.  This showed in the right breast the area of enhancement to total 7.7 cm anterior posteriorly.  Most of this was nodular and non-mass like, but in addition in the anterior aspect of this, there was a rounded area of mass-like enhancement measuring 1.7 cm.  This portion was suspicious for invasive ductal carcinoma.  The left breast was unremarkable, and there were no suspicious internal mammary or axillary lymph nodes noted.  Accordingly the patient was brought back for biopsy of the more mass-like area on 05/05/2010, and this biopsy ([id number]) showed a grade 2 invasive ductal carcinoma, which was estrogen receptor at 78% and progesterone receptor 82% positive.  The proliferation marker was  elevated at 85%.  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   Her subsequent history is as detailed below.  INTERVAL HISTORY: Penny Kim returns today for follow-up of her breast cancer. The interval history is generally stable. She continues on tamoxifen. She is tolerating that well, with minimal hot flashes and no vaginal wetness or discharge. She obtains it at a good price.  REVIEW OF SYSTEMS: Penny Kim finds her work stable. She works 10-12 hour shifts and then has 3 days off. She feels tired, but she does yoga every morning and is trying to get back on her exercise bike. She had significant right hip pain and has been diagnosed with gout. She is on allopurinol so far with not much response. She has mild urinary stress incontinence. She bruises easily. Since a fall last year she is been having back and joint pain, but this is not more persistent or intense than before. She feels forgetful, anxious, and depressed, but her depression is considerably better than it was a few years ago even though she is not on antidepressants at this point. Aside from these issues a detailed review of systems today was noncontributory.  PAST MEDICAL HISTORY: Past Medical History  Diagnosis Date  . Anxiety   . Breast cancer, right breast 02/2010    s/p chemo (completed 09/2010) and right mastectomy w/reconstruction  . Depression   . HTN (hypertension)   . Cancer   . Allergy     PAST SURGICAL HISTORY: Past Surgical History  Procedure Laterality Date  . Port-a-cath removal  11/26/2010  . Tissue expander removal  10/20/2010  left  . Portacath placement  07/23/2010  . Insertion of tissue expander after mastectomy  06/19/2010    bilat. mastectomies with bilat. tissue expanders  . Breast surgery  06/19/2010    exploration of right mastectomy site due to post-op bleeding  . Breast reconstruction  01/06/2012    Procedure: BREAST RECONSTRUCTION;  Surgeon: Crissie Reese, MD;  Location: [facility];  Service: Plastics;  Laterality: Bilateral;  bilateral removal of tissue expanders, bilateral placement of implants--Breast  Depression.  This has been present from the patient's early 20s.  She has never been hospitalized for this.  When she looks back on the antidepressants that she has used, she did best with Zoloft, but she tells me she had to discontinue that because of reflux issues.  She is followed by Dr. Kasandra Knudsen in [place] (clinic number [phone]), and he has recently changed her from Remeron to Walker.  However, the Pristiq is $50 a month, and Penny Kim has not been able to afford it.  A second problem is borderline glucose intolerance (there is a strong family history of diabetes).  FAMILY HISTORY Family History  Problem Relation Age of Onset  . Hypertension Mother   . Depression Mother   . Cancer Sister 30    Ovarian cancer  . Hypertension Maternal Grandmother   . Diabetes Maternal Grandmother   . 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 Hypertension Maternal Grandfather   . Diabetes Paternal Grandmother   . Hypertension Paternal Grandmother   . Kidney disease Paternal Grandmother     End stage renal dz - dialysis  . Hyperlipidemia Paternal Grandmother   . Alcohol abuse Father   . Depression Father   . Drug abuse Father 54    Died of drug overdose  The patient's father died at the age of 53 from drug overdose in the setting of ETOH abuse.  The patient's mother is alive.  She has a significant history of depression.  The patient has one sister, also with a history of depression.  GYNECOLOGIC HISTORY:   (Updated 03/11/2014) She is GX, P0.  She stopped having periods with chemotherapy in approximately November 2011. These resumed following a fall in February 2015, and has been somewhat regular since that time. Last menstrual cycle was 02/11/2014. 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  When asked who she would call in case of a problem, she really does not have anyone that she would call.  She does not feel her family is supportive, and she has no close friends.     ADVANCED DIRECTIVES: Not in place  HEALTH MAINTENANCE:  (Updated 03/11/2014) History  Substance Use Topics  . Smoking status: Never Smoker   . Smokeless tobacco: Never Used  . Alcohol Use: 0.6 oz/week    1 Glasses of wine per week     Comment: occasionally     Colonoscopy: Never  PAP: March 2015  Bone density: Never  Lipid panel:  November 2014  Allergies  Allergen Reactions  . Prochlorperazine Other (See Comments)    SYNCOPE  . Methadone Hcl Itching  . Tape Rash    Current Outpatient Prescriptions  Medication Sig Dispense Refill  . allopurinol (ZYLOPRIM)  100 MG tablet Take 1 tablet (100 mg total) by mouth daily. 30 tablet 6  . ascorbic acid (VITAMIN C) 500 MG tablet Take 500 mg by mouth daily.    . CHLOROPHYLL PO Take 60 mg by mouth daily.    . clonazePAM (KLONOPIN) 1 MG tablet Take 1 mg by mouth 2 (two) times daily.    . fish oil-omega-3 fatty acids 1000 MG capsule Take 1,200 g by mouth daily.    . Flaxseed, Linseed, (GNP FLAX SEED OIL) 1000 MG CAPS Take 1 capsule by mouth daily.    . hydrochlorothiazide (HYDRODIURIL) 25 MG tablet Take 1/2 tablet daily in the morning. 30 tablet 6  . Multiple Vitamin (MULTIVITAMIN) tablet Take 1 tablet by mouth daily.    . potassium chloride SA (K-DUR,KLOR-CON) 20 MEQ tablet Take 1 tablet (20 mEq total) by mouth daily. 30 tablet 1  . sertraline (ZOLOFT) 100 MG tablet Take 100 mg by mouth daily.    . tamoxifen (NOLVADEX) 20 MG tablet TAKE ONE TABLET BY MOUTH DAILY 90 tablet 1   No current facility-administered medications for this visit.    OBJECTIVE:  Young-appearing African American woman who appears stated age 44 Vitals:   09/30/14 1510  BP: 122/77  Pulse: 80  Temp: 98.6 F (37 C)  Resp: 18     Body mass index is 22.39 kg/(m^2).    ECOG FS: 0 Filed Weights   09/30/14 1510  Weight: 130 lb 8 oz (59.194 kg)   Sclerae unicteric, pupils equal and reactive Oropharynx clear and moist-- no thrush or other lesions  No cervical or supraclavicular adenopathy Lungs no rales or rhonchi Heart regular rate and rhythm Abd soft, nontender, positive bowel sounds MSK no focal spinal tenderness, no upper extremity lymphedema Neuro: nonfocal, well oriented, appropriate affect Breasts: status post bilateral mastectomies with implants in place; there is no evidence of chest wall recurrence; both axillae are benign    LAB RESULTS: Lab Results  Component Value Date   WBC 4.0 09/27/2014   NEUTROABS 1.7 09/27/2014   HGB 12.6 09/27/2014   HCT 38.2 09/27/2014   MCV 82.7 09/27/2014   PLT 197 09/27/2014      Chemistry      Component Value Date/Time   NA 141 09/27/2014 1419   NA 136 10/22/2013 1552   K 3.3* 09/27/2014 1419   K 4.2 10/22/2013 1552   CL 105 10/22/2013 1552   CL 105 01/30/2013 1313   CO2 31* 09/27/2014 1419   CO2 23 10/22/2013 1552   BUN 10.7 09/27/2014 1419   BUN 12 10/22/2013 1552   CREATININE 0.9 09/27/2014 1419   CREATININE 0.9 10/22/2013 1552      Component Value Date/Time   CALCIUM 9.6 09/27/2014 1419   CALCIUM 9.6 10/22/2013 1552   ALKPHOS 38* 09/27/2014 1419   ALKPHOS 36* 05/02/2012 1212   AST 23 09/27/2014 1419   AST 24 05/02/2012 1212   ALT 13 09/27/2014 1419   ALT 17 05/02/2012 1212   BILITOT 0.40 09/27/2014 1419   BILITOT 0.3 05/02/2012 1212       STUDIES: Right hip and lumbar spine films obtained 09/03/2014 showed no fracture or other abnormality.    ASSESSMENT: 44 y.o. Penny Kim woman with  a BRCA 2 mutation of uncertain significance:  (1)  status post bilateral mastectomies September 2011  for a right-sided T2 N0, stage IIA invasive ductal carcinoma, grade 3, estrogen and progesterone receptor positive, HER2 negative with an elevated MIB-1,   (2)  treated  with 4 cycles of adjuvant doxorubicin/cyclophosphamide given in dose-dense fashion and 4 weekly doses of paclitaxel of 12 planned, discontinued because of neuropathy.    (3) She did not require radiation.   (4)  She started tamoxifen in March 2012.     PLAN:   Penny Kim is doing generally well. The plan is going to be to continue tamoxifen for a total of 10 years. She understands that tamoxifen is not a contraceptive, and though she is not having overt periods she may be having subclinical cycling and she might get pregnant if she does not use contraceptives.  Her work is very stressful. We discussed some possible stress relief techniques that she could be using in the middle of work. Outside of work of course the best thing to do his exercises  She is can see [place] again in 6 months and then again in 12 months. She knows to call for any problems that may develop before that visit.   Chauncey Cruel, MD      09/30/2014

## 2014-10-01 NOTE — Addendum Note (Signed)
Addended by: Jaci Carrel A on: 10/01/2014 10:27 AM   Modules accepted: Orders, Medications

## 2014-10-04 ENCOUNTER — Encounter: Payer: Self-pay | Admitting: Family Medicine

## 2014-10-04 ENCOUNTER — Other Ambulatory Visit (INDEPENDENT_AMBULATORY_CARE_PROVIDER_SITE_OTHER): Payer: BLUE CROSS/BLUE SHIELD

## 2014-10-04 ENCOUNTER — Ambulatory Visit (INDEPENDENT_AMBULATORY_CARE_PROVIDER_SITE_OTHER): Payer: BLUE CROSS/BLUE SHIELD | Admitting: Family Medicine

## 2014-10-04 VITALS — BP 116/74 | HR 74 | Ht 64.0 in | Wt 133.0 lb

## 2014-10-04 DIAGNOSIS — M9902 Segmental and somatic dysfunction of thoracic region: Secondary | ICD-10-CM

## 2014-10-04 DIAGNOSIS — M999 Biomechanical lesion, unspecified: Secondary | ICD-10-CM

## 2014-10-04 DIAGNOSIS — M25551 Pain in right hip: Secondary | ICD-10-CM

## 2014-10-04 DIAGNOSIS — M9903 Segmental and somatic dysfunction of lumbar region: Secondary | ICD-10-CM

## 2014-10-04 DIAGNOSIS — M9905 Segmental and somatic dysfunction of pelvic region: Secondary | ICD-10-CM

## 2014-10-04 LAB — URIC ACID: Uric Acid, Serum: 3.7 mg/dL (ref 2.4–7.0)

## 2014-10-04 LAB — SEDIMENTATION RATE: SED RATE: 14 mm/h (ref 0–22)

## 2014-10-04 MED ORDER — ALLOPURINOL 300 MG PO TABS: 300.0000 mg | ORAL_TABLET | Freq: Every day | ORAL | Status: DC

## 2014-10-04 NOTE — Progress Notes (Signed)
  [name], [place] [address] Phone: [phone] Subjective:    CC: right hip pain follow up  HWE:[id number] ANYIA Penny Kim is a 44 y.o. female coming in with complaint of right hip pain.  Please see previous note for everything else patient has tried. Patient was seen and it appeared the patient may have had gouty deposits within the hip. Patient does have a history of breast cancer status post chemotherapy and has been in 4 years of remission. Patient continues to takes tamoxifen. Patient was put on allopurinol and given a home exercise program.  Patient was also seen by me 2 weeks ago and did have manipulation. Patient states that this did help. Patient states that she's been doing some exercises but continues to have some of the discomfort. Patient is now on 200 mg of allopurinol without any side effects. Patient continues to take a half of the diuretic. Patient states that this pain is bearable but she is wanting it to be pain-free of course. Patient is still the liberating if she would like an injection in the hip.  Once again patient's family history is significant for a younger sister having bilateral total hip replacements previously.    Past medical history, social, surgical and family history all reviewed in electronic medical record.   Review of Systems: No headache, visual changes, nausea, vomiting, diarrhea, constipation, dizziness, abdominal pain, skin rash, fevers, chills, night sweats, weight loss, swollen lymph nodes, body aches, joint swelling, muscle aches, chest pain, shortness of breath, mood changes.   Objective Blood pressure 116/74, pulse 74, height 5\' 4"  (1.626 m), weight 133 lb (60.328 kg), SpO2 94 %.  General: No apparent distress alert and oriented x3 mood and affect normal, dressed appropriately.  HEENT: Pupils equal, extraocular movements intact  Respiratory: Patient's speak in full sentences and does not appear  short of breath  Cardiovascular: No lower extremity edema, non tender, no erythema  Skin: Warm dry intact with no signs of infection or rash on extremities or on axial skeleton.  Abdomen: Soft nontender  Neuro: Cranial nerves II through XII are intact, neurovascularly intact in all extremities with 2+ DTRs and 2+ pulses.  Lymph: No lymphadenopathy of posterior or anterior cervical chain or axillae bilaterally.  Gait normal with good balance and coordination.  MSK:  Non tender with full range of motion and good stability and symmetric strength and tone of shoulders, elbows, wrist,  knee and ankles bilaterally.  LFY:BOFBP ROM IR: 25 Degwith pain with internal rotation, ER: 45 Degpainful groin pain, Flexion: 120 Deg, Extension: 100 Deg, Abduction: 45 Deg, Adduction: 45 Deg Strength IR: 5/5, ER: 5/5, Flexion: 5/5, Extension: 5/5, Abduction: 4/5, Adduction: 5/5 Pelvic alignment unremarkable to inspection and palpation. Standing hip rotation and gait without trendelenburg sign / unsteadiness. Greater trochanter without tenderness to palpation. No tenderness over piriformis and greater trochanter. Continued pain with FABER andFADIR less than previous exam. Continues to seem to improve Mild SI joint pain bilaterally   Osteopathic findings Cervical C4 flexed rotated and side bent right  Thoracic T3 extended rotated and side bent left  Lumbar L2 flexed rotated and side bent right  Sacrum Left on left  Ilium Right anterior     Impression and Recommendations:     This case required medical decision making of moderate complexity.

## 2014-10-04 NOTE — Assessment & Plan Note (Signed)
Discussed with patient again at great length. I do feel that likely patient is having some gouty deposition in her hip. Patient has elected to finally have her uric acid check. We will also check in ANA for screening of any autoimmune diseases. Patient continued to have pain could elect to have an intra-articular injection. With patient's family history of younger sister having bilateral hip replacements I'm concern for possible past medical history of avascular necrosis. Patient's x-rays do not show any significant changes in that. This is concerning oh to do with injection. Discussed with patient about the possible side effects of this. Patient though states that she would elect to have that done potentially if an increase in the allopurinol as well as a decrease in her blood pressure medications does not make any improvement. Discussed with her I would like to know for sure the diagnosis from her sister before we would proceed with the injection. Patient will talk to her sister this weekend. Patient and will come back and see me again in 3-4 weeks for further evaluation.

## 2014-10-04 NOTE — Assessment & Plan Note (Signed)
Decision today to treat with OMT was based on Physical Exam  After verbal consent patient was treated with HVLA, ME techniques in thoracic, lumbar, sacrum and ilium areas  Patient tolerated the procedure well with improvement in symptoms  Patient given exercises, stretches and lifestyle modifications  See medications in patient instructions if given  Patient will follow up in 2-3 weeks

## 2014-10-04 NOTE — Patient Instructions (Addendum)
Very nice to see you Uric acid today.  Allopurinol 300mg  nightly! Lets decrease HCTZ to 3 times weekly.  Ask your sister about avascular necrosis (AVN) See me in 2-3 weeks If stiill in pain and want to try after talking to sis we can try injection.

## 2014-10-07 LAB — ANA: Anti Nuclear Antibody(ANA): NEGATIVE

## 2014-10-16 ENCOUNTER — Other Ambulatory Visit: Payer: Self-pay | Admitting: Oncology

## 2014-10-25 ENCOUNTER — Encounter: Payer: Self-pay | Admitting: Family Medicine

## 2014-10-25 ENCOUNTER — Ambulatory Visit (INDEPENDENT_AMBULATORY_CARE_PROVIDER_SITE_OTHER): Payer: BLUE CROSS/BLUE SHIELD | Admitting: Family Medicine

## 2014-10-25 VITALS — BP 138/82 | HR 84 | Ht 64.0 in | Wt 132.0 lb

## 2014-10-25 DIAGNOSIS — M9902 Segmental and somatic dysfunction of thoracic region: Secondary | ICD-10-CM

## 2014-10-25 DIAGNOSIS — M9903 Segmental and somatic dysfunction of lumbar region: Secondary | ICD-10-CM

## 2014-10-25 DIAGNOSIS — M25551 Pain in right hip: Secondary | ICD-10-CM

## 2014-10-25 DIAGNOSIS — M999 Biomechanical lesion, unspecified: Secondary | ICD-10-CM

## 2014-10-25 DIAGNOSIS — M9905 Segmental and somatic dysfunction of pelvic region: Secondary | ICD-10-CM

## 2014-10-25 NOTE — Patient Instructions (Signed)
Good to see you Enjoy your flat jacket but do not wear it all the time.  ICe still is good Continue the exercises Lets get an MRI of the hip Continue the vitamins Melatonin 3mg  1 hour before you sleep See me again in 3-4 weeks.

## 2014-10-25 NOTE — Progress Notes (Signed)
  [name] [place], [place] [address] Phone: [phone] Subjective:    CC: right hip pain follow up  PVV:ZSMOLMBEML Penny Kim is a 44 y.o. female coming in with complaint of right hip pain.  Please see previous note for everything else patient has tried. Patient was seen and it appeared the patient may have had gouty deposits within the hip. Patient does have a history of breast cancer status post chemotherapy and has been in 4 years of remission. Patient continues to takes tamoxifen. Patient was put on allopurinol and given a home exercise program.  Patient had been responding very well to osteopathic manipulation for this pain as well. 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 Patient's uric acid though was unremarkable. Patient continue with the home exercises. Patient has gotten a brace for her back which she feels is more beneficial. Patient though continues to have the hip pain intermittently. Nothing that is stopping her from activity at this time.   Once again patient's family history is significant for a younger sister having bilateral total hip replacements previously. prortusio acetabuli    Past medical history, social, surgical and family history all reviewed in electronic medical record.   Review of Systems: No headache, visual changes, nausea, vomiting, diarrhea, constipation, dizziness, abdominal pain, skin rash, fevers, chills, night sweats, weight loss, swollen lymph nodes, body aches, joint swelling, muscle aches, chest pain, shortness of breath, mood changes.   Objective Blood pressure 138/82, pulse 84, height 5\' 4"  (1.626 m), weight 132 lb (59.875 kg), SpO2 99 %.  General: No apparent distress alert and oriented x3 mood and affect normal, dressed appropriately.  HEENT: Pupils equal, extraocular movements intact  Respiratory: Patient's speak in full sentences and  does not appear short of breath  Cardiovascular: No lower extremity edema, non tender, no erythema  Skin: Warm dry intact with no signs of infection or rash on extremities or on axial skeleton.  Abdomen: Soft nontender  Neuro: Cranial nerves II through XII are intact, neurovascularly intact in all extremities with 2+ DTRs and 2+ pulses.  Lymph: No lymphadenopathy of posterior or anterior cervical chain or axillae bilaterally.  Gait normal with good balance and coordination.  MSK:  Non tender with full range of motion and good stability and symmetric strength and tone of shoulders, elbows, wrist,  knee and ankles bilaterally.  JQG:BEEFE ROM IR: 25 Degwith pain with internal rotation, ER: 45 Degpainful groin pain, Flexion: 120 Deg, Extension: 100 Deg, Abduction: 45 Deg, Adduction: 45 Deg Strength IR: 5/5, ER: 5/5, Flexion: 5/5, Extension: 5/5, Abduction: 4/5, Adduction: 5/5 Pelvic alignment unremarkable to inspection and palpation. Standing hip rotation and gait without trendelenburg sign / unsteadiness. Greater trochanter without tenderness to palpation. No tenderness over piriformis and greater trochanter. Continued pain with FABER andFADIR . Continues to seem to improve Mild SI joint pain bilaterally   Osteopathic findings Cervical C4 flexed rotated and side bent right  Thoracic T3 extended rotated and side bent left  Lumbar L2 flexed rotated and side bent right  Sacrum Left on left  Ilium Neutral     Impression and Recommendations:     This case required medical decision making of moderate complexity.

## 2014-10-25 NOTE — Progress Notes (Signed)
Pre visit review using our clinic review tool, if applicable. No additional management support is needed unless otherwise documented below in the visit note. 

## 2014-10-26 NOTE — Assessment & Plan Note (Signed)
Decision today to treat with OMT was based on Physical Exam  After verbal consent patient was treated with HVLA, ME techniques in thoracic, lumbar, sacrum and ilium areas  Patient tolerated the procedure well with improvement in symptoms  Patient given exercises, stretches and lifestyle modifications  See medications in patient instructions if given  Patient will follow up in 3-4 weeks

## 2014-10-26 NOTE — Assessment & Plan Note (Signed)
Discussed with patient at length, patient given a potential for family disorder of connective tissue disease but patient is not having any significant phenotype. Patient's uric acid is unremarkable which usually rules out crystal arthropathy. We discussed the possibility of advanced imaging to rule out anything such as protrusion acetabulum. X-rays were reviewed again by me and no significant bony abnormality noted. Patient though is not having any of his pain started a regular daily activities. Patient is responding fairly well to osteopathic manipulation. Patient will continue with the conservative therapy at this time but if any worsening pain in the hip we will get advanced imaging. Otherwise patient can follow-up with me again in 3-4 weeks for further evaluation and treatment.  Spent  25 minutes with patient face-to-face and had greater than 50% of counseling including as described above in assessment and plan.

## 2014-11-17 ENCOUNTER — Ambulatory Visit
Admission: RE | Admit: 2014-11-17 | Discharge: 2014-11-17 | Disposition: A | Discharge status: Home / Self Care | Payer: BLUE CROSS/BLUE SHIELD | Source: Ambulatory Visit | Attending: Family Medicine | Admitting: Family Medicine

## 2014-11-17 DIAGNOSIS — M25551 Pain in right hip: Secondary | ICD-10-CM

## 2014-11-22 ENCOUNTER — Encounter: Payer: Self-pay | Admitting: Family Medicine

## 2014-11-22 ENCOUNTER — Ambulatory Visit (INDEPENDENT_AMBULATORY_CARE_PROVIDER_SITE_OTHER): Payer: BLUE CROSS/BLUE SHIELD | Admitting: Family Medicine

## 2014-11-22 VITALS — BP 120/82 | HR 96 | Ht 64.0 in | Wt 132.0 lb

## 2014-11-22 DIAGNOSIS — M9902 Segmental and somatic dysfunction of thoracic region: Secondary | ICD-10-CM

## 2014-11-22 DIAGNOSIS — M87051 Idiopathic aseptic necrosis of right femur: Secondary | ICD-10-CM

## 2014-11-22 DIAGNOSIS — M999 Biomechanical lesion, unspecified: Secondary | ICD-10-CM

## 2014-11-22 DIAGNOSIS — M8700 Idiopathic aseptic necrosis of unspecified bone: Secondary | ICD-10-CM | POA: Insufficient documentation

## 2014-11-22 DIAGNOSIS — M9903 Segmental and somatic dysfunction of lumbar region: Secondary | ICD-10-CM

## 2014-11-22 DIAGNOSIS — M87059 Idiopathic aseptic necrosis of unspecified femur: Secondary | ICD-10-CM | POA: Insufficient documentation

## 2014-11-22 DIAGNOSIS — M9905 Segmental and somatic dysfunction of pelvic region: Secondary | ICD-10-CM

## 2014-11-22 DIAGNOSIS — M87052 Idiopathic aseptic necrosis of left femur: Secondary | ICD-10-CM

## 2014-11-22 NOTE — Assessment & Plan Note (Signed)
Patient MRI does show this. Patient was given significant amount of information on this as well as a handout. We custom about conservative therapy and medications which patient declined at this time. We discussed bisphosphonates, vasodilators, and other modalities. Patient will continue with the natural supplementations. We discussed the possibility of referral to a surgeon to discuss other types of treatment. Patient has elected to decline this at this time. We will continue to follow up with patient on a fairly regular basis.

## 2014-11-22 NOTE — Progress Notes (Signed)
[facility] [address], [place] [address] Phone: [phone] Subjective:    CC: right hip pain follow up  INO:MVEHMCNOBS ADRIJANA Penny Kim is a 44 y.o. female coming in with complaint of right hip pain.  Please see previous note for everything else patient has tried. Patient was seen and it appeared the patient may have had gouty deposits within the hip. Patient does have a history of breast cancer status post chemotherapy and has been in 4 years of remission. Patient continues to takes tamoxifen. Patient was put on allopurinol and given a home exercise program.  Patient had been responding very well to osteopathic manipulation for this pain as well. 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 Patient's uric acid though was unremarkable.  Patient is still able to do daily activities but continued have pain so MRI was ordered. 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 No significant edema noted no active inflammation noted. It appears the patient is continuing to keep the morphology of the femoral head as well.  Patient states that her hip is not made any significant improvement but is not worsening. Patient is a cyclist and continues to cycling on a regular basis. Patient denies any radiation of the laying. States that it is still hurting with certain movements. Has been doing fairly well with osteopathic manipulation.    Once again patient's family history is significant for a younger sister having bilateral total hip replacements previously. prortusio acetabuli    Past medical history, social, surgical and family history all reviewed in electronic medical record.   Review of Systems: No headache, visual changes, nausea, vomiting, diarrhea, constipation, dizziness, abdominal pain, skin rash, fevers, chills, night sweats, weight  loss, swollen lymph nodes, body aches, joint swelling, muscle aches, chest pain, shortness of breath, mood changes.   Objective Blood pressure 120/82, pulse 96, height 5\' 4"  (1.626 m), weight 132 lb (59.875 kg), SpO2 94 %.  General: No apparent distress alert and oriented x3 mood and affect normal, dressed appropriately.  HEENT: Pupils equal, extraocular movements intact  Respiratory: Patient's speak in full sentences and does not appear short of breath  Cardiovascular: No lower extremity edema, non tender, no erythema  Skin: Warm dry intact with no signs of infection or rash on extremities or on axial skeleton.  Abdomen: Soft nontender  Neuro: Cranial nerves II through XII are intact, neurovascularly intact in all extremities with 2+ DTRs and 2+ pulses.  Lymph: No lymphadenopathy of posterior or anterior cervical chain or axillae bilaterally.  Gait normal with good balance and coordination.  MSK:  Non tender with full range of motion and good stability and symmetric strength and tone of shoulders, elbows, wrist,  knee and ankles bilaterally.  JGG:EZMOQ ROM IR: 25 Degwith pain with internal rotation, ER: 45 Degpainful groin pain, Flexion: 120 Deg, Extension: 100 Deg, Abduction: 45 Deg, Adduction: 45 Deg Strength IR: 5/5, ER: 5/5, Flexion: 5/5, Extension: 5/5, Abduction: 4/5, Adduction: 5/5 Pelvic alignment unremarkable to inspection and palpation. Standing hip rotation and gait without trendelenburg sign / unsteadiness. Greater trochanter without tenderness to palpation. No tenderness over piriformis and greater trochanter. Continued pain with FABER andFADIR .  Mild SI joint pain bilaterally No change from previous exam  Osteopathic findings Cervical C4 flexed rotated and side bent right  Thoracic T3 extended rotated and side bent left  Lumbar L2 flexed rotated and side bent right  Sacrum Left on left  Ilium Neutral     Impression and Recommendations:     This case  required medical decision making of moderate complexity.

## 2014-11-22 NOTE — Patient Instructions (Addendum)
Good to see you We can continue current therapy  Consider increasing omega 3 and decreasing omega 6 Read the packet. We can discuss.  We can continue manipulation Should see you again in 4-6 weeks.

## 2014-11-22 NOTE — Progress Notes (Signed)
Pre visit review using our clinic review tool, if applicable. No additional management support is needed unless otherwise documented below in the visit note. 

## 2014-11-22 NOTE — Assessment & Plan Note (Signed)
Decision today to treat with OMT was based on Physical Exam  After verbal consent patient was treated with HVLA, ME techniques in thoracic, lumbar, sacrum and ilium areas  Patient tolerated the procedure well with improvement in symptoms  Patient given exercises, stretches and lifestyle modifications  See medications in patient instructions if given  Patient will follow up in 4 weeks

## 2015-01-03 ENCOUNTER — Ambulatory Visit: Payer: Self-pay | Admitting: Family Medicine

## 2015-01-24 ENCOUNTER — Ambulatory Visit: Payer: Self-pay | Admitting: Family Medicine

## 2015-01-24 DIAGNOSIS — Z0289 Encounter for other administrative examinations: Secondary | ICD-10-CM

## 2015-02-09 ENCOUNTER — Other Ambulatory Visit: Payer: Self-pay | Admitting: Oncology

## 2015-03-06 ENCOUNTER — Ambulatory Visit (INDEPENDENT_AMBULATORY_CARE_PROVIDER_SITE_OTHER): Payer: 59 | Admitting: Psychiatry

## 2015-03-06 DIAGNOSIS — F332 Major depressive disorder, recurrent severe without psychotic features: Secondary | ICD-10-CM | POA: Diagnosis not present

## 2015-03-06 DIAGNOSIS — F064 Anxiety disorder due to known physiological condition: Secondary | ICD-10-CM | POA: Diagnosis not present

## 2015-03-14 ENCOUNTER — Encounter: Payer: Self-pay | Admitting: Genetic Counselor

## 2015-03-31 ENCOUNTER — Other Ambulatory Visit: Payer: Self-pay | Admitting: *Deleted

## 2015-03-31 DIAGNOSIS — C50911 Malignant neoplasm of unspecified site of right female breast: Secondary | ICD-10-CM

## 2015-04-01 ENCOUNTER — Other Ambulatory Visit: Payer: Self-pay

## 2015-04-01 ENCOUNTER — Ambulatory Visit: Payer: Self-pay | Admitting: Nurse Practitioner

## 2015-04-04 ENCOUNTER — Other Ambulatory Visit (HOSPITAL_BASED_OUTPATIENT_CLINIC_OR_DEPARTMENT_OTHER): Payer: 59

## 2015-04-04 DIAGNOSIS — C50319 Malignant neoplasm of lower-inner quadrant of unspecified female breast: Secondary | ICD-10-CM | POA: Diagnosis not present

## 2015-04-04 DIAGNOSIS — C50911 Malignant neoplasm of unspecified site of right female breast: Secondary | ICD-10-CM

## 2015-04-04 LAB — CBC WITH DIFFERENTIAL/PLATELET
BASO%: 0.7 % (ref 0.0–2.0)
BASOS ABS: 0 10*3/uL (ref 0.0–0.1)
EOS%: 1.5 % (ref 0.0–7.0)
Eosinophils Absolute: 0.1 10*3/uL (ref 0.0–0.5)
HEMATOCRIT: 37.8 % (ref 34.8–46.6)
HEMOGLOBIN: 12.6 g/dL (ref 11.6–15.9)
LYMPH#: 1.9 10*3/uL (ref 0.9–3.3)
LYMPH%: 39.3 % (ref 14.0–49.7)
MCH: 28.9 pg (ref 25.1–34.0)
MCHC: 33.4 g/dL (ref 31.5–36.0)
MCV: 86.4 fL (ref 79.5–101.0)
MONO#: 0.4 10*3/uL (ref 0.1–0.9)
MONO%: 8.4 % (ref 0.0–14.0)
NEUT#: 2.4 10*3/uL (ref 1.5–6.5)
NEUT%: 50.1 % (ref 38.4–76.8)
Platelets: 220 10*3/uL (ref 145–400)
RBC: 4.38 10*6/uL (ref 3.70–5.45)
RDW: 13.2 % (ref 11.2–14.5)
WBC: 4.8 10*3/uL (ref 3.9–10.3)

## 2015-04-04 LAB — COMPREHENSIVE METABOLIC PANEL (CC13)
ALBUMIN: 3.8 g/dL (ref 3.5–5.0)
ALT: 18 U/L (ref 0–55)
AST: 26 U/L (ref 5–34)
Alkaline Phosphatase: 38 U/L — ABNORMAL LOW (ref 40–150)
Anion Gap: 9 mEq/L (ref 3–11)
BUN: 7.8 mg/dL (ref 7.0–26.0)
CHLORIDE: 107 meq/L (ref 98–109)
CO2: 25 meq/L (ref 22–29)
Calcium: 9.4 mg/dL (ref 8.4–10.4)
Creatinine: 1.1 mg/dL (ref 0.6–1.1)
EGFR: 75 mL/min/{1.73_m2} — AB (ref >90)
GLUCOSE: 101 mg/dL (ref 70–140)
Potassium: 3.9 mEq/L (ref 3.5–5.1)
Sodium: 142 mEq/L (ref 136–145)
Total Bilirubin: <0.2 mg/dL (ref 0.20–1.20)
Total Protein: 7.1 g/dL (ref 6.4–8.3)

## 2015-04-11 ENCOUNTER — Ambulatory Visit (HOSPITAL_BASED_OUTPATIENT_CLINIC_OR_DEPARTMENT_OTHER): Payer: 59 | Admitting: Nurse Practitioner

## 2015-04-11 ENCOUNTER — Telehealth: Payer: Self-pay | Admitting: Nurse Practitioner

## 2015-04-11 ENCOUNTER — Encounter: Payer: Self-pay | Admitting: Nurse Practitioner

## 2015-04-11 VITALS — BP 134/79 | HR 79 | Temp 97.1°F | Resp 18 | Ht 64.0 in | Wt 136.9 lb

## 2015-04-11 DIAGNOSIS — C50311 Malignant neoplasm of lower-inner quadrant of right female breast: Secondary | ICD-10-CM

## 2015-04-11 DIAGNOSIS — Z79810 Long term (current) use of selective estrogen receptor modulators (SERMs): Secondary | ICD-10-CM | POA: Diagnosis not present

## 2015-04-11 DIAGNOSIS — C50911 Malignant neoplasm of unspecified site of right female breast: Secondary | ICD-10-CM

## 2015-04-11 DIAGNOSIS — Z170 Estrogen receptor positive status [ER+]: Secondary | ICD-10-CM | POA: Diagnosis not present

## 2015-04-11 NOTE — Progress Notes (Signed)
ID: Penny Kim   DOB: 1970-12-08  MR#: [id number]  CSN#:[id number]  PCP:  Penny Forward, NP GYN: [facility] SU: Rolm Bookbinder, MD OTHER MD: Crissie Reese, MD; Ovidio Hanger; Chucky May, MD, Hulan Saas M.D.,  CHIEF COMPLAINT:  Hx of Right Breast Cancer  CURRENT TREATMENT: Tamoxifen  BREAST CANCER HISTORY: From the original intake note:  Penny Kim had noticed that her right breast dropped a little differently than the left, and occasionally she would have a strange sensation radiating to the right nipple, but she really did not pay much attention to this.  She had her first mammogram ever, a screening mammogram at [facility], 04/23/2010, and this showed diffuse calcifications in the lower inner quadrant of the right breast measuring up to 5.6 cm.  She was recalled 08/08, and Dr. March Rummage found the breast to be dense, which limits mammographic sensitivity, but again was able to demonstrate these calcifications throughout the lower inner quadrant of the right breast.  Biopsy was discussed, and performed 08/09, and this showed [phone]) ductal carcinoma in situ, high grade, which was estrogen receptor 100% and progesterone receptor 57% positive.  Bilateral breast MRIs were obtained 08/14.  This showed in the right breast the area of enhancement to total 7.7 cm anterior posteriorly.  Most of this was nodular and non-mass like, but in addition in the anterior aspect of this, there was a rounded area of mass-like enhancement measuring 1.7 cm.  This portion was suspicious for invasive ductal carcinoma.  The left breast was unremarkable, and there were no suspicious internal mammary or axillary lymph nodes noted.  Accordingly the patient was brought back for biopsy of the more mass-like area on 05/05/2010, and this biopsy ([id number]) showed a grade 2 invasive ductal carcinoma, which was estrogen receptor at 78% and progesterone receptor 82% positive.  The proliferation marker was elevated at  85%.  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   Her subsequent history is as detailed below.  INTERVAL HISTORY: Penny Kim returns today for follow-up of her breast cancer. She has been on tamoxifen since March 2012 and is tolerating this drug well. Her hot flashes have improved since she first started the drug. She denies vaginal wetness. She had some spotting for the first time in 1 year last month, but none this month. The interval history is remarkable for getting a new job with [employer] that is significantly less stressful than her last job.  REVIEW OF SYSTEMS: Penny Kim denies fevers, chills, nausea, vomiting, or changes in bowel or bladder habits. She has been more physically active since she has been doing yoga and riding her exercise bike more often. Her energy level has improved. The only joint pain she has now is to her bilateral hands and this is likely related to the beginning of carpal tunnel. She denies headaches, dizziness, unexplained weight loss, or fatigue. She endorses anxiety and depression. A detailed review of systems is otherwise stable.  PAST MEDICAL HISTORY: Past Medical History  Diagnosis Date  . Anxiety   . Breast cancer, right breast 02/2010    s/p chemo (completed 09/2010) and right mastectomy w/reconstruction  . Depression   . HTN (hypertension)   . Cancer   . Allergy     PAST SURGICAL HISTORY: Past Surgical History  Procedure Laterality Date  . Port-a-cath removal  11/26/2010  . Tissue expander removal  10/20/2010    left  . Portacath placement  07/23/2010  . Insertion of tissue expander after mastectomy  06/19/2010    bilat. mastectomies with bilat. tissue expanders  . Breast surgery  06/19/2010    exploration of right mastectomy site due to post-op bleeding  . Breast reconstruction  01/06/2012    Procedure: BREAST RECONSTRUCTION;  Surgeon: Crissie Reese, MD;  Location: [facility];  Service: Plastics;  Laterality: Bilateral;   bilateral removal of tissue expanders, bilateral placement of implants--Breast  Depression.  This has been present from the patient's early 20s.  She has never been hospitalized for this.  When she looks back on the antidepressants that she has used, she did best with Zoloft, but she tells me she had to discontinue that because of reflux issues.  She is followed by Dr. Kasandra Knudsen in [place] (clinic number [phone]), and he has recently changed her from Remeron to [place].  However, the Pristiq is $50 a month, and [name] has not been able to afford it.  A second problem is borderline glucose intolerance (there is a strong family history of diabetes).  FAMILY HISTORY Family History  Problem Relation Age of Onset  . Hypertension Mother   . Depression Mother   . Cancer Sister 30    Ovarian cancer  . Hypertension Maternal Grandmother   . Diabetes Maternal Grandmother   . 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 Hypertension Maternal Grandfather   . Diabetes Paternal Grandmother   . Hypertension Paternal Grandmother   . Kidney disease Paternal Grandmother     End stage renal dz - dialysis  . Hyperlipidemia Paternal Grandmother   . Alcohol abuse Father   . Depression Father   . Drug abuse Father 26    Died of drug overdose  The patient's father died at the age of 15 from drug overdose in the setting of ETOH abuse.  The patient's mother is alive.  She has a significant history of depression.  The patient has one sister, also with a history of depression.  GYNECOLOGIC HISTORY:   (Updated 03/11/2014) She is GX, P0.  She stopped having periods with chemotherapy in approximately November 2011. These resumed following a fall in February 2015, and has been somewhat regular since that time. Last menstrual cycle was 02/11/2014. (Patient was reminded that tamoxifen is not a birth control method, and she does need to use barrier forms of birth control if and when  necessary.)  SOCIAL HISTORY:  (Updated 04/11/2015) [name] currently works with [employer].  When asked who she would call in case of a problem, she really does not have anyone that she would call.  She does not feel her family is supportive, and she has no close friends.     ADVANCED DIRECTIVES: Not in place  HEALTH MAINTENANCE:  (Updated 03/11/2014) History  Substance Use Topics  . Smoking status: Never Smoker   . Smokeless tobacco: Never Used  . Alcohol Use: 0.6 oz/week    1 Glasses of wine per week     Comment: occasionally     Colonoscopy: Never  PAP: March 2015  Bone density: Never  Lipid panel:  November 2014  Allergies  Allergen Reactions  . Prochlorperazine Other (See Comments)    SYNCOPE  . Methadone Hcl Itching  . Tape Rash    Current Outpatient Prescriptions  Medication Sig Dispense Refill  . ASCORBIC ACID PO Take 1 tablet by mouth daily.    . B Complex-Folic Acid (B COMPLEX-VITAMIN B12 PO) Take 1 Dose by mouth daily.    Marland Kitchen  Cholecalciferol 2000 UNITS CAPS Take 1 capsule by mouth daily.    . clonazePAM (KLONOPIN) 1 MG tablet Take 1 mg by mouth 2 (two) times daily.    . Flaxseed, Linseed, (GNP FLAX SEED OIL) 1000 MG CAPS Take 1 capsule by mouth daily.    . hydrochlorothiazide (HYDRODIURIL) 25 MG tablet Take 1/2 tablet daily in the morning. 30 tablet 6  . Melatonin 5 MG TABS Take 1 tablet by mouth at bedtime as needed.    . Multiple Vitamins-Minerals (MULTI-VITAMIN GUMMIES) CHEW Chew 1 Dose by mouth daily.    . tamoxifen (NOLVADEX) 20 MG tablet TAKE ONE TABLET BY MOUTH DAILY 90 tablet 1   No current facility-administered medications for this visit.    OBJECTIVE: Young-appearing African American woman who appears stated age 44 Vitals:   04/11/15 1144  BP: 134/79  Pulse: 79  Temp: 97.1 F (36.2 C)  Resp: 18     Body mass index is 23.49 kg/(m^2).    ECOG FS: 0 Filed Weights   04/11/15 1144  Weight: 136 lb 14.4 oz (62.097 kg)   Skin: warm, dry  HEENT: sclerae  anicteric, conjunctivae pink, oropharynx clear. No thrush or mucositis.  Lymph Nodes: No cervical or supraclavicular lymphadenopathy  Lungs: clear to auscultation bilaterally, no rales, wheezes, or rhonci  Heart: regular rate and rhythm  Abdomen: round, soft, non tender, positive bowel sounds  Musculoskeletal: No focal spinal tenderness, no peripheral edema  Neuro: non focal, well oriented, positive affect  Breasts: bilateral breasts status post mastectomies and implant reconstruction. No evidence of recurrent disease. Bilateral axillae benign.    LAB RESULTS: Lab Results  Component Value Date   WBC 4.8 04/04/2015   NEUTROABS 2.4 04/04/2015   HGB 12.6 04/04/2015   HCT 37.8 04/04/2015   MCV 86.4 04/04/2015   PLT 220 04/04/2015      Chemistry      Component Value Date/Time   NA 142 04/04/2015 1330   NA 136 10/22/2013 1552   K 3.9 04/04/2015 1330   K 4.2 10/22/2013 1552   CL 105 10/22/2013 1552   CL 105 01/30/2013 1313   CO2 25 04/04/2015 1330   CO2 23 10/22/2013 1552   BUN 7.8 04/04/2015 1330   BUN 12 10/22/2013 1552   CREATININE 1.1 04/04/2015 1330   CREATININE 0.9 10/22/2013 1552      Component Value Date/Time   CALCIUM 9.4 04/04/2015 1330   CALCIUM 9.6 10/22/2013 1552   ALKPHOS 38* 04/04/2015 1330   ALKPHOS 36* 05/02/2012 1212   AST 26 04/04/2015 1330   AST 24 05/02/2012 1212   ALT 18 04/04/2015 1330   ALT 17 05/02/2012 1212   BILITOT <0.20 04/04/2015 1330   BILITOT 0.3 05/02/2012 1212       STUDIES: Right hip and lumbar spine films obtained 09/03/2014 showed no fracture or other abnormality.    ASSESSMENT: 44 y.o. [place] woman with  a BRCA 2 mutation of uncertain significance:  (1)  status post bilateral mastectomies September 2011 for a right-sided T2 N0, stage IIA invasive ductal carcinoma, grade 3, estrogen and progesterone receptor positive, HER2 negative with an elevated MIB-1,   (2)  treated with 4 cycles of adjuvant doxorubicin/cyclophosphamide  given in dose-dense fashion and 4 weekly doses of paclitaxel of 12 planned, discontinued because of neuropathy.    (3) She did not require radiation.   (4)  She started tamoxifen in March 2012.     PLAN:  Janette continues to do well as far as  her breast cancer is concerned. She is tolerating the tamoxifen well and will continue this drug for 10 years of antiestrogen therapy.   She is unhappy with the final result of her breast reconstruction, and would like to seek a second opinion. I have placed a referral to Dr. Para Skeans office during our visit.   Makyiah will return in 6 months for labs and a follow up visit. She understands and agrees with this plan. She knows the goal of treatment in her case is cure. She has been encouraged to call with any issues that might arise before her next visit here.    Laurie Panda, NP     04/11/2015

## 2015-04-11 NOTE — Telephone Encounter (Signed)
Gave avs & calendar for January. Sent referral to HIM to send records.

## 2015-04-15 ENCOUNTER — Telehealth: Payer: Self-pay | Admitting: Oncology

## 2015-04-15 NOTE — Telephone Encounter (Signed)
Patient scheduled to Dr. Iran Planas 08/03 @ 8:15 medical records faxed. Left vm informing patient of appt. [phone](FPOIPP) [phone](OFV)

## 2015-04-16 ENCOUNTER — Telehealth: Payer: Self-pay | Admitting: Oncology

## 2015-04-16 NOTE — Telephone Encounter (Signed)
Pt appt to see Dr Iran Planas 04/23/15@8 :71

## 2015-04-22 DIAGNOSIS — Z853 Personal history of malignant neoplasm of breast: Secondary | ICD-10-CM | POA: Insufficient documentation

## 2015-04-22 DIAGNOSIS — Z9013 Acquired absence of bilateral breasts and nipples: Secondary | ICD-10-CM | POA: Insufficient documentation

## 2015-05-28 ENCOUNTER — Other Ambulatory Visit: Payer: Self-pay | Admitting: *Deleted

## 2015-05-28 MED ORDER — TAMOXIFEN CITRATE 20 MG PO TABS: 20.0000 mg | ORAL_TABLET | Freq: Every day | ORAL | Status: DC

## 2015-07-03 ENCOUNTER — Ambulatory Visit: Payer: 59 | Admitting: Psychiatry

## 2015-07-28 ENCOUNTER — Other Ambulatory Visit: Payer: Self-pay

## 2015-07-28 DIAGNOSIS — C50911 Malignant neoplasm of unspecified site of right female breast: Secondary | ICD-10-CM

## 2015-08-05 MED ORDER — TAMOXIFEN CITRATE 20 MG PO TABS: 20.0000 mg | ORAL_TABLET | Freq: Every day | ORAL | Status: DC

## 2015-08-27 ENCOUNTER — Ambulatory Visit (INDEPENDENT_AMBULATORY_CARE_PROVIDER_SITE_OTHER): Payer: 59 | Admitting: Nurse Practitioner

## 2015-08-27 VITALS — BP 112/78 | HR 82 | Temp 98.6°F | Resp 14 | Ht 64.0 in | Wt 140.8 lb

## 2015-08-27 DIAGNOSIS — Z23 Encounter for immunization: Secondary | ICD-10-CM | POA: Diagnosis not present

## 2015-08-27 DIAGNOSIS — Z0000 Encounter for general adult medical examination without abnormal findings: Secondary | ICD-10-CM

## 2015-08-27 LAB — COMPREHENSIVE METABOLIC PANEL
ALBUMIN: 4.3 g/dL (ref 3.5–5.2)
ALK PHOS: 30 U/L — AB (ref 39–117)
ALT: 13 U/L (ref 0–35)
AST: 22 U/L (ref 0–37)
BUN: 14 mg/dL (ref 6–23)
CO2: 28 mEq/L (ref 19–32)
CREATININE: 1.02 mg/dL (ref 0.40–1.20)
Calcium: 9.8 mg/dL (ref 8.4–10.5)
Chloride: 103 mEq/L (ref 96–112)
GFR: 75.51 mL/min (ref >60.00)
Glucose, Bld: 99 mg/dL (ref 70–99)
POTASSIUM: 4.1 meq/L (ref 3.5–5.1)
SODIUM: 140 meq/L (ref 135–145)
Total Bilirubin: 0.4 mg/dL (ref 0.2–1.2)
Total Protein: 7.8 g/dL (ref 6.0–8.3)

## 2015-08-27 LAB — CBC WITH DIFFERENTIAL/PLATELET
BASOS PCT: 0.6 % (ref 0.0–3.0)
Basophils Absolute: 0 10*3/uL (ref 0.0–0.1)
EOS PCT: 2.6 % (ref 0.0–5.0)
Eosinophils Absolute: 0.1 10*3/uL (ref 0.0–0.7)
HEMATOCRIT: 40.5 % (ref 36.0–46.0)
HEMOGLOBIN: 13.6 g/dL (ref 12.0–15.0)
LYMPHS PCT: 39.4 % (ref 12.0–46.0)
Lymphs Abs: 1.8 10*3/uL (ref 0.7–4.0)
MCHC: 33.5 g/dL (ref 30.0–36.0)
MCV: 86.1 fl (ref 78.0–100.0)
MONOS PCT: 7.4 % (ref 3.0–12.0)
Monocytes Absolute: 0.3 10*3/uL (ref 0.1–1.0)
Neutro Abs: 2.3 10*3/uL (ref 1.4–7.7)
Neutrophils Relative %: 50 % (ref 43.0–77.0)
Platelets: 234 10*3/uL (ref 150.0–400.0)
RBC: 4.71 Mil/uL (ref 3.87–5.11)
RDW: 13.4 % (ref 11.5–15.5)
WBC: 4.6 10*3/uL (ref 4.0–10.5)

## 2015-08-27 LAB — LIPID PANEL
CHOL/HDL RATIO: 5
Cholesterol: 223 mg/dL — ABNORMAL HIGH (ref 0–200)
HDL: 47 mg/dL (ref >39.00)
LDL CALC: 152 mg/dL — AB (ref 0–99)
NonHDL: 175.8
TRIGLYCERIDES: 119 mg/dL (ref 0.0–149.0)
VLDL: 23.8 mg/dL (ref 0.0–40.0)

## 2015-08-27 LAB — HEMOGLOBIN A1C: Hgb A1c MFr Bld: 5.7 % (ref 4.6–6.5)

## 2015-08-27 LAB — VITAMIN D 25 HYDROXY (VIT D DEFICIENCY, FRACTURES): VITD: 34.22 ng/mL (ref 30.00–100.00)

## 2015-08-27 LAB — TSH: TSH: 2.43 u[IU]/mL (ref 0.35–4.50)

## 2015-08-27 NOTE — Progress Notes (Signed)
Pre visit review using our clinic review tool, if applicable. No additional management support is needed unless otherwise documented below in the visit note. 

## 2015-08-27 NOTE — Patient Instructions (Signed)
Health Maintenance, Female Adopting a healthy lifestyle and getting preventive care can go a long way to promote health and wellness. Talk with your health care provider about what schedule of regular examinations is right for you. This is a good chance for you to check in with your provider about disease prevention and staying healthy. In between checkups, there are plenty of things you can do on your own. Experts have done a lot of research about which lifestyle changes and preventive measures are most likely to keep you healthy. Ask your health care provider for more information. WEIGHT AND DIET  Eat a healthy diet  Be sure to include plenty of vegetables, fruits, low-fat dairy products, and lean protein.  Do not eat a lot of foods high in solid fats, added sugars, or salt.  Get regular exercise. This is one of the most important things you can do for your health.  Most adults should exercise for at least 150 minutes each week. The exercise should increase your heart rate and make you sweat (moderate-intensity exercise).  Most adults should also do strengthening exercises at least twice a week. This is in addition to the moderate-intensity exercise.  Maintain a healthy weight  Body mass index (BMI) is a measurement that can be used to identify possible weight problems. It estimates body fat based on height and weight. Your health care provider can help determine your BMI and help you achieve or maintain a healthy weight.  For females 20 years of age and older:   A BMI below 18.5 is considered underweight.  A BMI of 18.5 to 24.9 is normal.  A BMI of 25 to 29.9 is considered overweight.  A BMI of 30 and above is considered obese.  Watch levels of cholesterol and blood lipids  You should start having your blood tested for lipids and cholesterol at 44 years of age, then have this test every 5 years.  You may need to have your cholesterol levels checked more often if:  Your lipid  or cholesterol levels are high.  You are older than 44 years of age.  You are at high risk for heart disease.  CANCER SCREENING   Lung Cancer  Lung cancer screening is recommended for adults 55-80 years old who are at high risk for lung cancer because of a history of smoking.  A yearly low-dose CT scan of the lungs is recommended for people who:  Currently smoke.  Have quit within the past 15 years.  Have at least a 30-pack-year history of smoking. A pack year is smoking an average of one pack of cigarettes a day for 1 year.  Yearly screening should continue until it has been 15 years since you quit.  Yearly screening should stop if you develop a health problem that would prevent you from having lung cancer treatment.  Breast Cancer  Practice breast self-awareness. This means understanding how your breasts normally appear and feel.  It also means doing regular breast self-exams. Let your health care provider know about any changes, no matter how small.  If you are in your 20s or 30s, you should have a clinical breast exam (CBE) by a health care provider every 1-3 years as part of a regular health exam.  If you are 40 or older, have a CBE every year. Also consider having a breast X-ray (mammogram) every year.  If you have a family history of breast cancer, talk to your health care provider about genetic screening.  If you   are at high risk for breast cancer, talk to your health care provider about having an MRI and a mammogram every year.  Breast cancer gene (BRCA) assessment is recommended for women who have family members with BRCA-related cancers. BRCA-related cancers include:  Breast.  Ovarian.  Tubal.  Peritoneal cancers.  Results of the assessment will determine the need for genetic counseling and BRCA1 and BRCA2 testing. Cervical Cancer Your health care provider may recommend that you be screened regularly for cancer of the pelvic organs (ovaries, uterus, and  vagina). This screening involves a pelvic examination, including checking for microscopic changes to the surface of your cervix (Pap test). You may be encouraged to have this screening done every 3 years, beginning at age 21.  For women ages 30-65, health care providers may recommend pelvic exams and Pap testing every 3 years, or they may recommend the Pap and pelvic exam, combined with testing for human papilloma virus (HPV), every 5 years. Some types of HPV increase your risk of cervical cancer. Testing for HPV may also be done on women of any age with unclear Pap test results.  Other health care providers may not recommend any screening for nonpregnant women who are considered low risk for pelvic cancer and who do not have symptoms. Ask your health care provider if a screening pelvic exam is right for you.  If you have had past treatment for cervical cancer or a condition that could lead to cancer, you need Pap tests and screening for cancer for at least 20 years after your treatment. If Pap tests have been discontinued, your risk factors (such as having a new sexual partner) need to be reassessed to determine if screening should resume. Some women have medical problems that increase the chance of getting cervical cancer. In these cases, your health care provider may recommend more frequent screening and Pap tests. Colorectal Cancer  This type of cancer can be detected and often prevented.  Routine colorectal cancer screening usually begins at 44 years of age and continues through 44 years of age.  Your health care provider may recommend screening at an earlier age if you have risk factors for colon cancer.  Your health care provider may also recommend using home test kits to check for hidden blood in the stool.  A small camera at the end of a tube can be used to examine your colon directly (sigmoidoscopy or colonoscopy). This is done to check for the earliest forms of colorectal  cancer.  Routine screening usually begins at age 50.  Direct examination of the colon should be repeated every 5-10 years through 44 years of age. However, you may need to be screened more often if early forms of precancerous polyps or small growths are found. Skin Cancer  Check your skin from head to toe regularly.  Tell your health care provider about any new moles or changes in moles, especially if there is a change in a mole's shape or color.  Also tell your health care provider if you have a mole that is larger than the size of a pencil eraser.  Always use sunscreen. Apply sunscreen liberally and repeatedly throughout the day.  Protect yourself by wearing long sleeves, pants, a wide-brimmed hat, and sunglasses whenever you are outside. HEART DISEASE, DIABETES, AND HIGH BLOOD PRESSURE   High blood pressure causes heart disease and increases the risk of stroke. High blood pressure is more likely to develop in:  People who have blood pressure in the high end   of the normal range (130-139/85-89 mm Hg).  People who are overweight or obese.  People who are African American.  If you are 38-23 years of age, have your blood pressure checked every 3-5 years. If you are 61 years of age or older, have your blood pressure checked every year. You should have your blood pressure measured twice--once when you are at a hospital or clinic, and once when you are not at a hospital or clinic. Record the average of the two measurements. To check your blood pressure when you are not at a hospital or clinic, you can use:  An automated blood pressure machine at a pharmacy.  A home blood pressure monitor.  If you are between 45 years and 39 years old, ask your health care provider if you should take aspirin to prevent strokes.  Have regular diabetes screenings. This involves taking a blood sample to check your fasting blood sugar level.  If you are at a normal weight and have a low risk for diabetes,  have this test once every three years after 44 years of age.  If you are overweight and have a high risk for diabetes, consider being tested at a younger age or more often. PREVENTING INFECTION  Hepatitis B  If you have a higher risk for hepatitis B, you should be screened for this virus. You are considered at high risk for hepatitis B if:  You were born in a country where hepatitis B is common. Ask your health care provider which countries are considered high risk.  Your parents were born in a high-risk country, and you have not been immunized against hepatitis B (hepatitis B vaccine).  You have HIV or AIDS.  You use needles to inject street drugs.  You live with someone who has hepatitis B.  You have had sex with someone who has hepatitis B.  You get hemodialysis treatment.  You take certain medicines for conditions, including cancer, organ transplantation, and autoimmune conditions. Hepatitis C  Blood testing is recommended for:  Everyone born from [birth year] through 1965.  Anyone with known risk factors for hepatitis C. Sexually transmitted infections (STIs)  You should be screened for sexually transmitted infections (STIs) including gonorrhea and chlamydia if:  You are sexually active and are younger than 44 years of age.  You are older than 44 years of age and your health care provider tells you that you are at risk for this type of infection.  Your sexual activity has changed since you were last screened and you are at an increased risk for chlamydia or gonorrhea. Ask your health care provider if you are at risk.  If you do not have HIV, but are at risk, it may be recommended that you take a prescription medicine daily to prevent HIV infection. This is called pre-exposure prophylaxis (PrEP). You are considered at risk if:  You are sexually active and do not regularly use condoms or know the HIV status of your partner(s).  You take drugs by injection.  You are sexually  active with a partner who has HIV. Talk with your health care provider about whether you are at high risk of being infected with HIV. If you choose to begin PrEP, you should first be tested for HIV. You should then be tested every 3 months for as long as you are taking PrEP.  PREGNANCY   If you are premenopausal and you may become pregnant, ask your health care provider about preconception counseling.  If you may  become pregnant, take 400 to 800 micrograms (mcg) of folic acid every day.  If you want to prevent pregnancy, talk to your health care provider about birth control (contraception). OSTEOPOROSIS AND MENOPAUSE   Osteoporosis is a disease in which the bones lose minerals and strength with aging. This can result in serious bone fractures. Your risk for osteoporosis can be identified using a bone density scan.  If you are 72 years of age or older, or if you are at risk for osteoporosis and fractures, ask your health care provider if you should be screened.  Ask your health care provider whether you should take a calcium or vitamin D supplement to lower your risk for osteoporosis.  Menopause may have certain physical symptoms and risks.  Hormone replacement therapy may reduce some of these symptoms and risks. Talk to your health care provider about whether hormone replacement therapy is right for you.  HOME CARE INSTRUCTIONS   Schedule regular health, dental, and eye exams.  Stay current with your immunizations.   Do not use any tobacco products including cigarettes, chewing tobacco, or electronic cigarettes.  If you are pregnant, do not drink alcohol.  If you are breastfeeding, limit how much and how often you drink alcohol.  Limit alcohol intake to no more than 1 drink per day for nonpregnant women. One drink equals 12 ounces of beer, 5 ounces of wine, or 1 ounces of hard liquor.  Do not use street drugs.  Do not share needles.  Ask your health care provider for help if  you need support or information about quitting drugs.  Tell your health care provider if you often feel depressed.  Tell your health care provider if you have ever been abused or do not feel safe at home.   This information is not intended to replace advice given to you by your health care provider. Make sure you discuss any questions you have with your health care provider.   Document Released: 03/22/2011 Document Revised: 09/27/2014 Document Reviewed: 08/08/2013 Elsevier Interactive Patient Education [employer].

## 2015-08-27 NOTE — Progress Notes (Signed)
Patient ID: Penny Kim, female    DOB: [birth year]  Age: 44 y.o. MRN: [id number]  CC: Annual Exam   HPI Penny Kim presents for Annual Physical Exam.   1) Health Maintenance-   Diet- No formal   Exercise- No formal   Immunizations- Tdap- Today Flu- Concerned about this  Mammogram- Breast Cancer Pt Right side   Mastectomies in 2011   Tamoxifen started March 2012   Every 6 months breast exam   Pap- 2015 Normal Okay for 3 years, Sees OB/GYN  Bone Density- Look into this   Eye Exam- Not UTD  Dental Exam- UTD  Labs- Fasting today  Fall Screen- Not at risk   Depression Screen- Counseling with Hospice, Seeing her weekly, feels it is helpful   Taking for depression: Sees Dr. August Saucer Sertraline 200 mg a day  Lamotrigene  Klonopin 1 mg- takes one a day    History Penny Kim has a past medical history of Anxiety; Breast cancer, right breast ([place]) (02/2010); Depression; HTN (hypertension); Cancer ([place]); and Allergy.   She has past surgical history that includes Port-a-cath removal (11/26/2010); Tissue expander removal (10/20/2010); Portacath placement (07/23/2010); Insertion of tissue expander after mastectomy (06/19/2010); Breast surgery (06/19/2010); and Breast reconstruction (01/06/2012).   Her family history includes Alcohol abuse in her father; Cancer in her maternal grandfather; Cancer (age of onset: 45) in her sister; Depression in her father and mother; Diabetes in her maternal grandmother and paternal grandmother; Drug abuse (age of onset: 54) in her father; Hyperlipidemia in her paternal grandmother; Hypertension in her maternal grandfather, maternal grandmother, mother, and paternal grandmother; Kidney disease in her paternal grandmother.She reports that she has never smoked. She has never used smokeless tobacco. She reports that she drinks about 0.6 oz of alcohol per week. She reports that she does not use illicit drugs.  Outpatient Prescriptions Prior to Visit  Medication Sig Dispense  Refill  . clonazePAM (KLONOPIN) 1 MG tablet Take 1 mg by mouth 2 (two) times daily.    . hydrochlorothiazide (HYDRODIURIL) 25 MG tablet Take 1/2 tablet daily in the morning. 30 tablet 6  . Melatonin 5 MG TABS Take 1 tablet by mouth at bedtime as needed.    . Multiple Vitamins-Minerals (MULTI-VITAMIN GUMMIES) CHEW Chew 1 Dose by mouth daily.    . tamoxifen (NOLVADEX) 20 MG tablet Take 1 tablet (20 mg total) by mouth daily. 90 tablet 1  . ASCORBIC ACID PO Take 1 tablet by mouth daily.    . B Complex-Folic Acid (B COMPLEX-VITAMIN B12 PO) Take 1 Dose by mouth daily.    . Cholecalciferol 2000 UNITS CAPS Take 1 capsule by mouth daily.    . Flaxseed, Linseed, (GNP FLAX SEED OIL) 1000 MG CAPS Take 1 capsule by mouth daily.     No facility-administered medications prior to visit.    ROS Review of Systems  Constitutional: Negative for fever, chills, diaphoresis, fatigue and unexpected weight change.  HENT: Negative for tinnitus and trouble swallowing.   Eyes: Negative for visual disturbance.  Respiratory: Negative for cough, chest tightness and wheezing.   Cardiovascular: Negative for chest pain, palpitations and leg swelling.  Gastrointestinal: Negative for nausea, vomiting, abdominal pain, diarrhea, constipation and blood in stool.  Endocrine: Negative for polydipsia, polyphagia and polyuria.  Genitourinary: Negative for dysuria, hematuria, vaginal discharge and vaginal pain.  Musculoskeletal: Negative for myalgias, back pain, arthralgias and gait problem.  Skin: Negative for color change and rash.  Neurological: Negative for dizziness, weakness, numbness and headaches.  Hematological: Does not bruise/bleed easily.  Psychiatric/Behavioral: Negative for suicidal ideas and sleep disturbance. The patient is nervous/anxious.     Objective:  BP 112/78 mmHg  Pulse 82  Temp(Src) 98.6 F (37 C)  Resp 14  Ht 5\' 4"  (1.626 m)  Wt 140 lb 12.8 oz (63.866 kg)  BMI 24.16 kg/m2  SpO2 96%  Physical  Exam  Constitutional: She is oriented to person, place, and time. She appears well-developed and well-nourished. No distress.  HENT:  Head: Normocephalic and atraumatic.  Right Ear: External ear normal.  Left Ear: External ear normal.  Nose: Nose normal.  Mouth/Throat: Oropharynx is clear and moist. No oropharyngeal exudate.  TMs and canals clear bilaterally  Eyes: Conjunctivae and EOM are normal. Pupils are equal, round, and reactive to light. Right eye exhibits no discharge. Left eye exhibits no discharge. No scleral icterus.  Neck: Normal range of motion. Neck supple. No thyromegaly present.  Cardiovascular: Normal rate, regular rhythm and normal heart sounds.  Exam reveals no gallop and no friction rub.   No murmur heard. Pulmonary/Chest: Effort normal and breath sounds normal. No respiratory distress. She has no wheezes. She has no rales. She exhibits no tenderness.  Patient has bi-yearly follow-ups of general surgery after breast CA  Abdominal: Soft. Bowel sounds are normal. She exhibits no distension and no mass. There is no tenderness. There is no rebound and no guarding.  Genitourinary:  Sees OB/GYN  Musculoskeletal: Normal range of motion. She exhibits no edema or tenderness.  Lymphadenopathy:    She has no cervical adenopathy.  Neurological: She is alert and oriented to person, place, and time. She has normal reflexes. No cranial nerve deficit. She exhibits normal muscle tone. Coordination normal.  Skin: Skin is warm and dry. No rash noted. She is not diaphoretic. No erythema. No pallor.  Psychiatric: She has a normal mood and affect. Her behavior is normal. Judgment and thought content normal.   Assessment & Plan:   Penny Kim was seen today for annual exam.  Diagnoses and all orders for this visit:  Routine general medical examination at a health care facility -     CBC with Differential/Platelet -     Comprehensive metabolic panel -     Hemoglobin A1c -     Lipid panel -      TSH -     VITAMIN D 25 Hydroxy (Vit-D Deficiency, Fractures)   I have discontinued Penny Kim's GNP FLAX SEED OIL, Cholecalciferol, ASCORBIC ACID PO, and B Complex-Folic Acid (B COMPLEX-VITAMIN B12 PO). I am also having her maintain her clonazePAM, hydrochlorothiazide, Melatonin, MULTI-VITAMIN GUMMIES, tamoxifen, Probiotic Product (PROBIOTIC ADVANCED PO), Omega-3 Fatty Acids (FISH OIL ADULT GUMMIES PO), Iron, vitamin C, Emollient (KERI ADVANCED MOISTURE THERAPY EX), Vitamin D3, sertraline, and lamoTRIgine.  Meds ordered this encounter  Medications  . Probiotic Product (PROBIOTIC ADVANCED PO)    Sig: Take 1 tablet by mouth daily.  . Omega-3 Fatty Acids (FISH OIL ADULT GUMMIES PO)    Sig: Take 1 Dose by mouth daily.  . Ferrous Sulfate (IRON) 28 MG TABS    Sig: Take 1 tablet by mouth daily.  . Ascorbic Acid (VITAMIN C) 1000 MG tablet    Sig: Take 1,000 mg by mouth daily.  Marland Kitchen Emollient (KERI ADVANCED MOISTURE THERAPY EX)    Sig: Apply topically.  . Cholecalciferol (VITAMIN D3) 2000 UNITS TABS    Sig: Take 1 tablet by mouth.  . sertraline (ZOLOFT) 100 MG tablet    Sig: Take 100  mg by mouth daily.  Marland Kitchen lamoTRIgine (LAMICTAL) 150 MG tablet    Sig: Take 150 mg by mouth daily.     Follow-up: Return in about 2 weeks (around 09/10/2015).

## 2015-09-05 ENCOUNTER — Encounter: Payer: Self-pay | Admitting: Nurse Practitioner

## 2015-09-05 DIAGNOSIS — Z0000 Encounter for general adult medical examination without abnormal findings: Secondary | ICD-10-CM | POA: Insufficient documentation

## 2015-09-05 NOTE — Assessment & Plan Note (Signed)
Discussed acute and chronic issues. Reviewed health maintenance measures, PFSHx, and immunizations. Obtain routine labs TSH, Lipid panel, CBC w/ diff, A1c, Vitamin D, and CMET.   PAP and Breast done elsewhere Depression Screen Positive, but pt is receiving care

## 2015-09-08 DIAGNOSIS — Z23 Encounter for immunization: Secondary | ICD-10-CM

## 2015-09-08 NOTE — Addendum Note (Signed)
Addended by: Bevelyn Ngo on: 09/08/2015 11:15 AM   Modules accepted: Miquel Dunn

## 2015-09-11 ENCOUNTER — Ambulatory Visit (INDEPENDENT_AMBULATORY_CARE_PROVIDER_SITE_OTHER): Payer: 59 | Admitting: Nurse Practitioner

## 2015-09-11 ENCOUNTER — Ambulatory Visit: Payer: Self-pay | Admitting: Nurse Practitioner

## 2015-09-11 ENCOUNTER — Telehealth: Payer: Self-pay | Admitting: Nurse Practitioner

## 2015-09-11 ENCOUNTER — Encounter: Payer: Self-pay | Admitting: Nurse Practitioner

## 2015-09-11 VITALS — BP 102/68 | HR 72 | Temp 98.1°F | Resp 12 | Ht 64.0 in | Wt 142.6 lb

## 2015-09-11 DIAGNOSIS — F329 Major depressive disorder, single episode, unspecified: Secondary | ICD-10-CM | POA: Diagnosis not present

## 2015-09-11 DIAGNOSIS — E785 Hyperlipidemia, unspecified: Secondary | ICD-10-CM | POA: Insufficient documentation

## 2015-09-11 DIAGNOSIS — I10 Essential (primary) hypertension: Secondary | ICD-10-CM

## 2015-09-11 DIAGNOSIS — F32A Depression, unspecified: Secondary | ICD-10-CM

## 2015-09-11 NOTE — Assessment & Plan Note (Signed)
BP Readings from Last 3 Encounters:  09/11/15 102/68  08/27/15 112/78  04/11/15 134/79   Continue current medication

## 2015-09-11 NOTE — Progress Notes (Signed)
Pre visit review using our clinic review tool, if applicable. No additional management support is needed unless otherwise documented below in the visit note. 

## 2015-09-11 NOTE — Patient Instructions (Addendum)
Follow up in 2 months or sooner as needed.   Merry Christmas!

## 2015-09-11 NOTE — Assessment & Plan Note (Signed)
Pt stable on Lamictal. She is afraid to take zoloft with this. Advised her to try half since she is no longer drinking alcohol or smoking marijuana. Pt is seeing [facility] for care after her mother's death. FU in 2 months or sooner as needed.   More good days than bad

## 2015-09-11 NOTE — Assessment & Plan Note (Signed)
Cholesterol slightly elevated. Asked her to work on diet and exercise. She is agreeable.

## 2015-09-11 NOTE — Telephone Encounter (Signed)
Patient was called this morning just to check in and see how she was doing since she missed her 8:00 appointment. She said that she is okay, and come in at 3:30 today so I will see her then.

## 2015-09-11 NOTE — Progress Notes (Signed)
Patient ID: Penny Kim, female    DOB: 28-Aug-1971  Age: 44 y.o. MRN: [id number]  CC: Follow-up   HPI CHARDA DEVOR presents for follow up of depression.   1) Saturday didn't take full dosage Less anxiety with increased activity  LMP- 2 months back on track   No zombie like feelings, or forehead pressure  Doing more physical activity   Being more conscientious about food  History Lunah has a past medical history of Anxiety; Breast cancer, right breast ([place]) (02/2010); Depression; HTN (hypertension); Cancer ([place]); and Allergy.   She has past surgical history that includes Port-a-cath removal (11/26/2010); Tissue expander removal (10/20/2010); Portacath placement (07/23/2010); Insertion of tissue expander after mastectomy (06/19/2010); Breast surgery (06/19/2010); and Breast reconstruction (01/06/2012).   Her family history includes Alcohol abuse in her father; Cancer in her maternal grandfather; Cancer (age of onset: 23) in her sister; Depression in her father and mother; Diabetes in her maternal grandmother and paternal grandmother; Drug abuse (age of onset: 53) in her father; Hyperlipidemia in her paternal grandmother; Hypertension in her maternal grandfather, maternal grandmother, mother, and paternal grandmother; Kidney disease in her paternal grandmother.She reports that she has never smoked. She has never used smokeless tobacco. She reports that she drinks about 0.6 oz of alcohol per week. She reports that she does not use illicit drugs.  Outpatient Prescriptions Prior to Visit  Medication Sig Dispense Refill  . Ascorbic Acid (VITAMIN C) 1000 MG tablet Take 1,000 mg by mouth daily.    . Cholecalciferol (VITAMIN D3) 2000 UNITS TABS Take 1 tablet by mouth.    . clonazePAM (KLONOPIN) 1 MG tablet Take 1 mg by mouth 2 (two) times daily.    . Ferrous Sulfate (IRON) 28 MG TABS Take 1 tablet by mouth daily.    . hydrochlorothiazide (HYDRODIURIL) 25 MG tablet Take 1/2 tablet daily in the morning. 30  tablet 6  . Melatonin 5 MG TABS Take 1 tablet by mouth at bedtime as needed.    . Multiple Vitamins-Minerals (MULTI-VITAMIN GUMMIES) CHEW Chew 1 Dose by mouth daily.    . Omega-3 Fatty Acids (FISH OIL ADULT GUMMIES PO) Take 1 Dose by mouth daily.    . Probiotic Product (PROBIOTIC ADVANCED PO) Take 1 tablet by mouth daily.    . tamoxifen (NOLVADEX) 20 MG tablet Take 1 tablet (20 mg total) by mouth daily. 90 tablet 1  . Emollient (KERI ADVANCED MOISTURE THERAPY EX) Apply topically.    . lamoTRIgine (LAMICTAL) 150 MG tablet Take 150 mg by mouth daily. Reported on 09/11/2015    . sertraline (ZOLOFT) 100 MG tablet Take 100 mg by mouth daily. Reported on 09/11/2015     No facility-administered medications prior to visit.    ROS Review of Systems  Constitutional: Negative for fever, chills, diaphoresis and fatigue.  Respiratory: Negative for chest tightness, shortness of breath and wheezing.   Cardiovascular: Negative for chest pain, palpitations and leg swelling.  Gastrointestinal: Negative for nausea, vomiting and diarrhea.  Skin: Negative for rash.  Neurological: Negative for dizziness, weakness, numbness and headaches.  Psychiatric/Behavioral: Negative for suicidal ideas, behavioral problems, confusion, sleep disturbance, decreased concentration and agitation. The patient is nervous/anxious.     Objective:  BP 102/68 mmHg  Pulse 72  Temp(Src) 98.1 F (36.7 C) (Oral)  Resp 12  Ht 5\' 4"  (1.626 m)  Wt 142 lb 9.6 oz (64.683 kg)  BMI 24.47 kg/m2  SpO2 97%  Physical Exam  Constitutional: She is oriented to person, place, and  time. She appears well-developed and well-nourished. No distress.  HENT:  Head: Normocephalic and atraumatic.  Right Ear: External ear normal.  Left Ear: External ear normal.  Cardiovascular: Normal rate and regular rhythm.   Pulmonary/Chest: Effort normal and breath sounds normal. No respiratory distress. She has no wheezes. She has no rales. She exhibits no  tenderness.  Neurological: She is alert and oriented to person, place, and time. No cranial nerve deficit. She exhibits normal muscle tone. Coordination normal.  Skin: Skin is warm and dry. No rash noted. She is not diaphoretic.  Psychiatric: She has a normal mood and affect. Her behavior is normal. Judgment and thought content normal.   Assessment & Plan:   Jozette was seen today for follow-up.  Diagnoses and all orders for this visit:  Essential hypertension  Depression  Hyperlipidemia   I have discontinued Ms. Bradway's Emollient (KERI ADVANCED MOISTURE THERAPY EX). I am also having her maintain her clonazePAM, hydrochlorothiazide, Melatonin, MULTI-VITAMIN GUMMIES, tamoxifen, Probiotic Product (PROBIOTIC ADVANCED PO), Omega-3 Fatty Acids (FISH OIL ADULT GUMMIES PO), Iron, vitamin C, Vitamin D3, sertraline, and lamoTRIgine.  No orders of the defined types were placed in this encounter.     Follow-up: Return in about 2 months (around 11/12/2015) for Follow up .

## 2015-09-18 ENCOUNTER — Other Ambulatory Visit: Payer: Self-pay | Admitting: Nurse Practitioner

## 2015-09-21 DIAGNOSIS — F1910 Other psychoactive substance abuse, uncomplicated: Secondary | ICD-10-CM

## 2015-09-21 HISTORY — DX: Other psychoactive substance abuse, uncomplicated: F19.10

## 2015-10-01 ENCOUNTER — Other Ambulatory Visit: Payer: Self-pay | Admitting: *Deleted

## 2015-10-01 ENCOUNTER — Telehealth: Payer: Self-pay | Admitting: Oncology

## 2015-10-01 DIAGNOSIS — C50911 Malignant neoplasm of unspecified site of right female breast: Secondary | ICD-10-CM

## 2015-10-01 NOTE — Telephone Encounter (Signed)
Patient called in to reschedule her appointment °

## 2015-10-02 ENCOUNTER — Ambulatory Visit: Payer: Self-pay | Admitting: Oncology

## 2015-10-02 ENCOUNTER — Other Ambulatory Visit: Payer: Self-pay

## 2015-10-06 ENCOUNTER — Encounter: Payer: Self-pay | Admitting: Nurse Practitioner

## 2015-10-06 ENCOUNTER — Other Ambulatory Visit: Payer: Self-pay

## 2015-10-06 NOTE — Progress Notes (Signed)
This encounter was created in error - please disregard.

## 2015-10-07 ENCOUNTER — Encounter: Payer: Self-pay | Admitting: Genetic Counselor

## 2015-10-07 DIAGNOSIS — Z1379 Encounter for other screening for genetic and chromosomal anomalies: Secondary | ICD-10-CM

## 2015-10-07 HISTORY — DX: Encounter for other screening for genetic and chromosomal anomalies: Z13.79

## 2015-10-09 ENCOUNTER — Telehealth: Payer: Self-pay | Admitting: Oncology

## 2015-10-09 NOTE — Telephone Encounter (Signed)
Patient called and r/s missed lab/HB from 1/16. Gave  Patient new appointment for lab/HB 1/23 @ 1:45 pm.

## 2015-10-13 ENCOUNTER — Ambulatory Visit (HOSPITAL_BASED_OUTPATIENT_CLINIC_OR_DEPARTMENT_OTHER): Payer: BLUE CROSS/BLUE SHIELD | Admitting: Nurse Practitioner

## 2015-10-13 ENCOUNTER — Telehealth: Payer: Self-pay | Admitting: Oncology

## 2015-10-13 ENCOUNTER — Other Ambulatory Visit (HOSPITAL_BASED_OUTPATIENT_CLINIC_OR_DEPARTMENT_OTHER): Payer: BLUE CROSS/BLUE SHIELD

## 2015-10-13 ENCOUNTER — Encounter: Payer: Self-pay | Admitting: Nurse Practitioner

## 2015-10-13 ENCOUNTER — Other Ambulatory Visit: Payer: Self-pay | Admitting: *Deleted

## 2015-10-13 VITALS — BP 118/77 | HR 77 | Temp 98.1°F | Resp 17 | Ht 64.0 in | Wt 142.3 lb

## 2015-10-13 DIAGNOSIS — Z79810 Long term (current) use of selective estrogen receptor modulators (SERMs): Secondary | ICD-10-CM

## 2015-10-13 DIAGNOSIS — C50911 Malignant neoplasm of unspecified site of right female breast: Secondary | ICD-10-CM

## 2015-10-13 DIAGNOSIS — Z170 Estrogen receptor positive status [ER+]: Secondary | ICD-10-CM | POA: Diagnosis not present

## 2015-10-13 DIAGNOSIS — C50311 Malignant neoplasm of lower-inner quadrant of right female breast: Secondary | ICD-10-CM | POA: Diagnosis not present

## 2015-10-13 DIAGNOSIS — C50211 Malignant neoplasm of upper-inner quadrant of right female breast: Secondary | ICD-10-CM

## 2015-10-13 LAB — CBC WITH DIFFERENTIAL/PLATELET
BASO%: 0.6 % (ref 0.0–2.0)
BASOS ABS: 0 10*3/uL (ref 0.0–0.1)
EOS ABS: 0.1 10*3/uL (ref 0.0–0.5)
EOS%: 1.5 % (ref 0.0–7.0)
HEMATOCRIT: 37.1 % (ref 34.8–46.6)
HEMOGLOBIN: 12.9 g/dL (ref 11.6–15.9)
LYMPH#: 1.6 10*3/uL (ref 0.9–3.3)
LYMPH%: 30.8 % (ref 14.0–49.7)
MCH: 29.2 pg (ref 25.1–34.0)
MCHC: 34.8 g/dL (ref 31.5–36.0)
MCV: 83.9 fL (ref 79.5–101.0)
MONO#: 0.4 10*3/uL (ref 0.1–0.9)
MONO%: 6.6 % (ref 0.0–14.0)
NEUT#: 3.2 10*3/uL (ref 1.5–6.5)
NEUT%: 60.5 % (ref 38.4–76.8)
PLATELETS: 206 10*3/uL (ref 145–400)
RBC: 4.42 10*6/uL (ref 3.70–5.45)
RDW: 12.7 % (ref 11.2–14.5)
WBC: 5.3 10*3/uL (ref 3.9–10.3)
nRBC: 0 % (ref 0–0)

## 2015-10-13 LAB — COMPREHENSIVE METABOLIC PANEL
ALBUMIN: 4 g/dL (ref 3.5–5.0)
ALK PHOS: 38 U/L — AB (ref 40–150)
ALT: 15 U/L (ref 0–55)
ANION GAP: 10 meq/L (ref 3–11)
AST: 22 U/L (ref 5–34)
BUN: 10.2 mg/dL (ref 7.0–26.0)
CALCIUM: 9.5 mg/dL (ref 8.4–10.4)
CHLORIDE: 101 meq/L (ref 98–109)
CO2: 27 mEq/L (ref 22–29)
Creatinine: 1 mg/dL (ref 0.6–1.1)
EGFR: 75 mL/min/{1.73_m2} — AB (ref >90)
Glucose: 114 mg/dl (ref 70–140)
POTASSIUM: 3.6 meq/L (ref 3.5–5.1)
Sodium: 138 mEq/L (ref 136–145)
Total Bilirubin: <0.3 mg/dL (ref 0.20–1.20)
Total Protein: 7.4 g/dL (ref 6.4–8.3)

## 2015-10-13 MED ORDER — TAMOXIFEN CITRATE 20 MG PO TABS: 20.0000 mg | ORAL_TABLET | Freq: Every day | ORAL | Status: DC

## 2015-10-13 NOTE — Telephone Encounter (Signed)
Gave patient avs report and appointments for January 2018.  °

## 2015-10-13 NOTE — Progress Notes (Signed)
ID: Penny Kim   DOB: May 13, 1971  MR#: [id number]  CSN#:[id number]  PCP:  Charolette Forward, NP GYN: [facility] SU: Rolm Bookbinder, MD OTHER MD: Crissie Reese, MD; Ovidio Hanger; Chucky May, MD, Hulan Saas M.D.,  CHIEF COMPLAINT:  Hx of Right Breast Cancer  CURRENT TREATMENT: Tamoxifen  BREAST CANCER HISTORY: From the original intake note:  Penny Kim had noticed that her right breast dropped a little differently than the left, and occasionally she would have a strange sensation radiating to the right nipple, but she really did not pay much attention to this.  She had her first mammogram ever, a screening mammogram at [facility], 04/23/2010, and this showed diffuse calcifications in the lower inner quadrant of the right breast measuring up to 5.6 cm.  She was recalled 08/08, and Dr. March Rummage found the breast to be dense, which limits mammographic sensitivity, but again was able to demonstrate these calcifications throughout the lower inner quadrant of the right breast.  Biopsy was discussed, and performed 08/09, and this showed [phone]) ductal carcinoma in situ, high grade, which was estrogen receptor 100% and progesterone receptor 57% positive.  Bilateral breast MRIs were obtained 08/14.  This showed in the right breast the area of enhancement to total 7.7 cm anterior posteriorly.  Most of this was nodular and non-mass like, but in addition in the anterior aspect of this, there was a rounded area of mass-like enhancement measuring 1.7 cm.  This portion was suspicious for invasive ductal carcinoma.  The left breast was unremarkable, and there were no suspicious internal mammary or axillary lymph nodes noted.  Accordingly the patient was brought back for biopsy of the more mass-like area on 05/05/2010, and this biopsy ([id number]) showed a grade 2 invasive ductal carcinoma, which was estrogen receptor at 78% and progesterone receptor 82% positive.  The proliferation marker was elevated at  85%.  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   Her subsequent history is as detailed below.  INTERVAL HISTORY: Penny Kim returns today for follow-up of her breast cancer. She continues tamoxifen daily and tolerates this drug well. She has hot flashes but they are much less frequent. She denies vaginal changes. The interval history is remarkable for the sudden passing of her mother in August of last year from some type of acute kidney injury the patient was started on zoloft and is pleased with her mood while on this drug.   REVIEW OF SYSTEMS: Penny Kim denies fevers, chills, nausea, vomiting, or changes in bowel or bladder habits. She was told by her PCP that she has a borderline high cholesterol level and A1c. The patient has not made many changes her diet, and her only physical activity is yoga. She has some mild but sharp pains to her left breast, but otherwise denies pain. She has no headaches, dizziness, or vision changes. She denies shortness of breath, chest pain, cough, or palpitations. A detailed review of systems is otherwise stable.  PAST MEDICAL HISTORY: Past Medical History  Diagnosis Date  . Anxiety   . Breast cancer, right breast ([place]) 02/2010    s/p chemo (completed 09/2010) and right mastectomy w/reconstruction  . Depression   . HTN (hypertension)   . Cancer ([place])   . Allergy     PAST SURGICAL HISTORY: Past Surgical History  Procedure Laterality Date  . Port-a-cath removal  11/26/2010  . Tissue expander removal  10/20/2010    left  . Portacath placement  07/23/2010  . Insertion of tissue  expander after mastectomy  06/19/2010    bilat. mastectomies with bilat. tissue expanders  . Breast surgery  06/19/2010    exploration of right mastectomy site due to post-op bleeding  . Breast reconstruction  01/06/2012    Procedure: BREAST RECONSTRUCTION;  Surgeon: Crissie Reese, MD;  Location: [facility];  Service: Plastics;  Laterality: Bilateral;   bilateral removal of tissue expanders, bilateral placement of implants--Breast  Depression.  This has been present from the patient's early 20s.  She has never been hospitalized for this.  When she looks back on the antidepressants that she has used, she did best with Zoloft, but she tells me she had to discontinue that because of reflux issues.  She is followed by Dr. Kasandra Knudsen in [place] (clinic number [phone]), and he has recently changed her from Remeron to [place].  However, the Pristiq is $50 a month, and Penny Kim has not been able to afford it.  A second problem is borderline glucose intolerance (there is a strong family history of diabetes).  FAMILY HISTORY Family History  Problem Relation Age of Onset  . Hypertension Mother   . Depression Mother   . Cancer Sister 30    Ovarian cancer  . Hypertension Maternal Grandmother   . Diabetes Maternal Grandmother   . 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 Hypertension Maternal Grandfather   . Diabetes Paternal Grandmother   . Hypertension Paternal Grandmother   . Kidney disease Paternal Grandmother     End stage renal dz - dialysis  . Hyperlipidemia Paternal Grandmother   . Alcohol abuse Father   . Depression Father   . Drug abuse Father 59    Died of drug overdose  The patient's father died at the age of 25 from drug overdose in the setting of ETOH abuse.  The patient's mother is alive.  She has a significant history of depression.  The patient has one sister, also with a history of depression.  GYNECOLOGIC HISTORY:   (Updated 03/11/2014) She is GX, P0.  She stopped having periods with chemotherapy in approximately November 2011. These resumed following a fall in February 2015, and has been somewhat regular since that time. Last menstrual cycle was 02/11/2014. (Patient was reminded that tamoxifen is not a birth control method, and she does need to use barrier forms of birth control if and when  necessary.)  SOCIAL HISTORY:  (Updated 04/11/2015) Penny Kim currently works with [employer].  When asked who she would call in case of a problem, she really does not have anyone that she would call.  She does not feel her family is supportive, and she has no close friends.     ADVANCED DIRECTIVES: Not in place  HEALTH MAINTENANCE:  (Updated 03/11/2014) Social History  Substance Use Topics  . Smoking status: Never Smoker   . Smokeless tobacco: Never Used  . Alcohol Use: 0.6 oz/week    1 Glasses of wine per week     Comment: occasionally     Colonoscopy: Never  PAP: March 2015  Bone density: Never  Lipid panel:  November 2014  Allergies  Allergen Reactions  . Prochlorperazine Other (See Comments)    SYNCOPE  . Methadone Hcl Itching  . Tape Rash    Current Outpatient Prescriptions  Medication Sig Dispense Refill  . Ascorbic Acid (VITAMIN C) 1000 MG tablet Take 1,000 mg by mouth daily.    . Cholecalciferol (VITAMIN D3) 2000 UNITS TABS Take 1  tablet by mouth.    . clonazePAM (KLONOPIN) 1 MG tablet Take 1 mg by mouth 2 (two) times daily. Pt takes  1-2 tablets daily    . Ferrous Sulfate (IRON) 28 MG TABS Take 1 tablet by mouth daily.    . hydrochlorothiazide (HYDRODIURIL) 25 MG tablet TAKE 1/2 TABLET BY MOUTH EVERY MORNING 15 tablet 0  . lamoTRIgine (LAMICTAL) 150 MG tablet Take 150 mg by mouth daily. Reported on 09/11/2015    . Melatonin 5 MG TABS Take 1 tablet by mouth at bedtime as needed.    . Multiple Vitamins-Minerals (MULTI-VITAMIN GUMMIES) CHEW Chew 1 Dose by mouth daily.    . Omega-3 Fatty Acids (FISH OIL ADULT GUMMIES PO) Take 1 Dose by mouth daily.    . Probiotic Product (PROBIOTIC ADVANCED PO) Take 1 tablet by mouth daily.    . sertraline (ZOLOFT) 100 MG tablet Take 100 mg by mouth daily. Reported on 09/11/2015    . tamoxifen (NOLVADEX) 20 MG tablet Take 1 tablet (20 mg total) by mouth daily. 90 tablet 1   No current facility-administered medications for this visit.     OBJECTIVE: Young-appearing African American woman who appears stated age 73 Vitals:   10/13/15 1423  BP: 118/77  Pulse: 77  Temp: 98.1 F (36.7 C)  Resp: 17     Body mass index is 24.41 kg/(m^2).    ECOG FS: 0 Filed Weights   10/13/15 1423  Weight: 142 lb 4.8 oz (64.547 kg)   Skin: warm, dry  HEENT: sclerae anicteric, conjunctivae pink, oropharynx clear. No thrush or mucositis.  Lymph Nodes: No cervical or supraclavicular lymphadenopathy  Lungs: clear to auscultation bilaterally, no rales, wheezes, or rhonci  Heart: regular rate and rhythm  Abdomen: round, soft, non tender, positive bowel sounds  Musculoskeletal: No focal spinal tenderness, no peripheral edema  Neuro: non focal, well oriented, positive affect  Breasts: bilateral breasts status post mastectomies and implant reconstruction. No evidence of recurrent disease. Bilateral axillae benign.    LAB RESULTS: Lab Results  Component Value Date   WBC 5.3 10/13/2015   NEUTROABS 3.2 10/13/2015   HGB 12.9 10/13/2015   HCT 37.1 10/13/2015   MCV 83.9 10/13/2015   PLT 206 10/13/2015      Chemistry      Component Value Date/Time   NA 138 10/13/2015 1406   NA 140 08/27/2015 0908   K 3.6 10/13/2015 1406   K 4.1 08/27/2015 0908   CL 103 08/27/2015 0908   CL 105 01/30/2013 1313   CO2 27 10/13/2015 1406   CO2 28 08/27/2015 0908   BUN 10.2 10/13/2015 1406   BUN 14 08/27/2015 0908   CREATININE 1.0 10/13/2015 1406   CREATININE 1.02 08/27/2015 0908      Component Value Date/Time   CALCIUM 9.5 10/13/2015 1406   CALCIUM 9.8 08/27/2015 0908   ALKPHOS 38* 10/13/2015 1406   ALKPHOS 30* 08/27/2015 0908   AST 22 10/13/2015 1406   AST 22 08/27/2015 0908   ALT 15 10/13/2015 1406   ALT 13 08/27/2015 0908   BILITOT <0.30 10/13/2015 1406   BILITOT 0.4 08/27/2015 0908      STUDIES: No results found.  ASSESSMENT: 45 y.o. [place] woman with  a BRCA 2 mutation of uncertain significance:  (1)  status post bilateral  mastectomies September 2011 for a right-sided T2 N0, stage IIA invasive ductal carcinoma, grade 3, estrogen and progesterone receptor positive, HER2 negative with an elevated MIB-1,   (2)  treated with 4  cycles of adjuvant doxorubicin/cyclophosphamide given in dose-dense fashion and 4 weekly doses of paclitaxel of 12 planned, discontinued because of neuropathy.    (3) She did not require radiation.   (4)  She started tamoxifen in March 2012.     PLAN:  Penny Kim looks and feels well today. The labs were reviewed in detail and were stable. She is tolerating the tamoxifen well and she will continue this drug for 10 years of antiestrogen therapy. Note that she believed she was to stop after 5 years, but now understands that there is data that proved that the 10 year treatment period is superior. Plus she has few issues with the drug, and finds no hassle in taking it.   Penny Kim will return in 1 year for labs and a follow up visit. She understands and agrees with this plan. She knows the goal of treatment in her case is cure. She has been encouraged to call with any issues that might arise before    Laurie Panda, NP   10/13/2015

## 2015-11-04 ENCOUNTER — Other Ambulatory Visit: Payer: Self-pay | Admitting: Nurse Practitioner

## 2015-11-14 ENCOUNTER — Ambulatory Visit: Payer: Self-pay | Admitting: Nurse Practitioner

## 2016-03-05 ENCOUNTER — Encounter: Payer: Self-pay | Admitting: Genetic Counselor

## 2016-03-05 ENCOUNTER — Other Ambulatory Visit: Payer: Self-pay | Admitting: Oncology

## 2016-07-04 ENCOUNTER — Encounter: Payer: Self-pay | Admitting: Nurse Practitioner

## 2016-07-05 ENCOUNTER — Other Ambulatory Visit: Payer: Self-pay | Admitting: Oncology

## 2016-07-07 ENCOUNTER — Encounter: Payer: Self-pay | Admitting: Nurse Practitioner

## 2016-07-08 ENCOUNTER — Encounter: Payer: Self-pay | Admitting: Nurse Practitioner

## 2016-07-29 ENCOUNTER — Telehealth: Payer: Self-pay | Admitting: Nurse Practitioner

## 2016-07-29 NOTE — Telephone Encounter (Signed)
I spoke with the patient.  She was a former patient of C.Doss, NP.  Has been following up with a Oncology doctor in the past 71months, has had chemotherapy.  To make ends meet had to take on a second job.  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  Since starting that job, she has had a few incidences of fatigue and lightheadness/Dizziness.  One episode involved a fall in her bathroom.  She remembers washing her face and brushing her teeth and then found herself with her face against the wall.  She is not sure how long she was out for , but has bruising to the face from that.  She later in the week had another fall from attempting to open a window at her home.  Her concern is is the second job causing the fatigue or is this a side effect of being out of shape or a side effect from the chemo.  Wants a through exam, and is scheduled with Dr. Lacinda Axon on the 17th due to her schedule constraints.  She was advised to go to the ER/urgent care if she has another episode prior the appt to seek medical care, she complied.  Just wanted to make you aware prior to her appt with you. Thanks

## 2016-07-29 NOTE — Telephone Encounter (Signed)
No. Should it have?

## 2016-07-29 NOTE — Telephone Encounter (Signed)
Pt called and state that she is having some dizziness. She states that she is now working 2 jobs and feels that it may be something to do with that. Please advise, thank you!  Call pt @ [phone]

## 2016-07-29 NOTE — Telephone Encounter (Signed)
Did this go to team health?

## 2016-08-06 ENCOUNTER — Ambulatory Visit (INDEPENDENT_AMBULATORY_CARE_PROVIDER_SITE_OTHER): Payer: BLUE CROSS/BLUE SHIELD | Admitting: Family Medicine

## 2016-08-06 ENCOUNTER — Encounter: Payer: Self-pay | Admitting: Family Medicine

## 2016-08-06 VITALS — BP 126/85 | HR 95 | Temp 98.5°F | Resp 12 | Ht 64.0 in | Wt 148.4 lb

## 2016-08-06 DIAGNOSIS — I10 Essential (primary) hypertension: Secondary | ICD-10-CM

## 2016-08-06 DIAGNOSIS — E7800 Pure hypercholesterolemia, unspecified: Secondary | ICD-10-CM | POA: Diagnosis not present

## 2016-08-06 DIAGNOSIS — R7303 Prediabetes: Secondary | ICD-10-CM

## 2016-08-06 DIAGNOSIS — R55 Syncope and collapse: Secondary | ICD-10-CM | POA: Diagnosis not present

## 2016-08-06 LAB — LIPID PANEL
CHOL/HDL RATIO: 4
Cholesterol: 250 mg/dL — ABNORMAL HIGH (ref 0–200)
HDL: 61.6 mg/dL (ref >39.00)
LDL Cholesterol: 154 mg/dL — ABNORMAL HIGH (ref 0–99)
NONHDL: 188.19
Triglycerides: 172 mg/dL — ABNORMAL HIGH (ref 0.0–149.0)
VLDL: 34.4 mg/dL (ref 0.0–40.0)

## 2016-08-06 LAB — CBC
HCT: 38.3 % (ref 36.0–46.0)
Hemoglobin: 13 g/dL (ref 12.0–15.0)
MCHC: 33.9 g/dL (ref 30.0–36.0)
MCV: 85.4 fl (ref 78.0–100.0)
Platelets: 199 10*3/uL (ref 150.0–400.0)
RBC: 4.48 Mil/uL (ref 3.87–5.11)
RDW: 13.8 % (ref 11.5–15.5)
WBC: 3.9 10*3/uL — AB (ref 4.0–10.5)

## 2016-08-06 LAB — COMPREHENSIVE METABOLIC PANEL
ALBUMIN: 4.2 g/dL (ref 3.5–5.2)
ALK PHOS: 30 U/L — AB (ref 39–117)
ALT: 11 U/L (ref 0–35)
AST: 15 U/L (ref 0–37)
BUN: 12 mg/dL (ref 6–23)
CHLORIDE: 103 meq/L (ref 96–112)
CO2: 30 mEq/L (ref 19–32)
CREATININE: 0.87 mg/dL (ref 0.40–1.20)
Calcium: 9.1 mg/dL (ref 8.4–10.5)
GFR: 90.34 mL/min (ref >60.00)
GLUCOSE: 108 mg/dL — AB (ref 70–99)
POTASSIUM: 3.5 meq/L (ref 3.5–5.1)
SODIUM: 141 meq/L (ref 135–145)
TOTAL PROTEIN: 6.9 g/dL (ref 6.0–8.3)
Total Bilirubin: 0.3 mg/dL (ref 0.2–1.2)

## 2016-08-06 LAB — TSH: TSH: 1.9 u[IU]/mL (ref 0.35–4.50)

## 2016-08-06 LAB — HEMOGLOBIN A1C: HEMOGLOBIN A1C: 5.9 % (ref 4.6–6.5)

## 2016-08-06 NOTE — Assessment & Plan Note (Signed)
New problem. Uncertain etiology and prognosis at this time. Limited history as this was not witnessed. History not suggestive of seizure activity or vasovagal syncope. Concern for neurocardiogenic versus cardiogenic. Sending to cardiology for evaluation.

## 2016-08-06 NOTE — Progress Notes (Signed)
Subjective:  Patient ID: Penny Kim, female    DOB: [birth year]  Age: 45 y.o. MRN: [id number]  CC: Follow up, Syncopal episode  HPI:  45 year old female with hx of breast cancer (s/p bilateral mastecomy and chemo), HTN, HLD presents for follow up. Issues are below.  HTN  Stable on HCTZ.  HLD  Uncontrolled.  Untreated.  Needs to be rechecked.  Syncope  Patient reports that she had a syncopal episode approximately 3 weeks ago.   She states that she was brushing her teeth and subsequently passed out.  No preceding nausea, palpitations, chest pain, shortness of breath.  She is unsure of how long she was out. She woke up and had a scrape on her face.  This was not witnessed.   Afterwards she felt exhausted but had no associated neurological deficits.  She attributed this to fatigue and exhaustion.  No other associated symptoms. This has not happened again.  Unclear etiology for this event.  Social Hx   Social History   Social History  . Marital status: Single    Spouse name: N/A  . Number of children: N/A  . Years of education: 45   Occupational History  . Glass blower/designer   Social History Main Topics  . Smoking status: Never Smoker  . Smokeless tobacco: Never Used  . Alcohol use 0.6 oz/week    1 Glasses of wine per week     Comment: occasionally  . Drug use: No  . Sexual activity: Not Currently    Birth control/ protection: None   Other Topics Concern  . None   Social History Narrative   Penny Kim [place], [place]. She attended [employer] where she obtained her Bachelors of Science degree in Psychology. 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 She currently lives in [place]. She lives alone. Addeline enjoys attending live musical performances. 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      Review of Systems  Constitutional: Positive for fatigue.  Neurological: Positive for syncope.   Objective:  BP 126/85 (BP Location: Left Arm, Patient Position: Sitting, Cuff Size: Normal)   Pulse 95   Temp 98.5 F (36.9 C) (Oral)   Resp 12   Ht 5\' 4"  (1.626 m)   Wt 148 lb 6 oz (67.3 kg)   SpO2 98%   BMI 25.47 kg/m   BP/Weight 08/06/2016 10/13/2015 [id number]  Systolic BP [id number]  Diastolic BP 85 77 68  Wt. (Lbs) 148.38 142.3 142.6  BMI 25.47 24.41 24.47   Physical Exam  Constitutional: She is oriented to person, place, and time. She appears well-developed. No distress.  HENT:  Head: Normocephalic and atraumatic.  Eyes: Conjunctivae are normal. No scleral icterus.  Neck: Neck supple. No thyromegaly present.  Cardiovascular: Normal rate and regular rhythm.   Pulmonary/Chest: Effort normal and breath sounds normal.  Abdominal: Soft. She exhibits no distension. There is no tenderness. There is no rebound and no guarding.  Neurological: She is alert and oriented to person, place, and time.  No focal deficits.  Psychiatric: She has a normal mood and affect.  Vitals reviewed.  Lab Results  Component Value Date   WBC 3.9 (L) 08/06/2016   HGB 13.0 08/06/2016   HCT 38.3 08/06/2016   PLT 199.0 08/06/2016   GLUCOSE 108 (H) 08/06/2016   CHOL 250 (  H) 08/06/2016   TRIG 172.0 (H) 08/06/2016   HDL 61.60 08/06/2016   LDLCALC 154 (H) 08/06/2016   ALT 11 08/06/2016   AST 15 08/06/2016   NA 141 08/06/2016   K 3.5 08/06/2016   CL 103 08/06/2016   CREATININE 0.87 08/06/2016   BUN 12 08/06/2016   CO2 30 08/06/2016   TSH 1.90 08/06/2016   INR 1.08 03/22/2011   HGBA1C 5.9 08/06/2016    Assessment & Plan:   Problem List Items Addressed This Visit    Syncope    New problem. Uncertain etiology and prognosis at this time. Limited history as this was not witnessed. History not suggestive of seizure activity or vasovagal syncope. Concern for neurocardiogenic versus  cardiogenic. Sending to cardiology for evaluation.      Relevant Orders   CBC (Completed)   TSH (Completed)   Ambulatory referral to Cardiology   Prediabetes   Relevant Orders   Hemoglobin A1c (Completed)   Hyperlipidemia    Uncontrolled. Lipid panel today.      Relevant Orders   Lipid panel (Completed)   Essential hypertension - Primary    Stable although diastolic slightly up (per new guidelines). We'll continue HCTZ.       Relevant Orders   Comprehensive metabolic panel (Completed)     Follow-up: 3-6 months  [place] DO [facility]

## 2016-08-06 NOTE — Patient Instructions (Signed)
Continue your meds.  We will call with your lab results.  We will call with your appt with cardiology.  Follow up in 3-6 months.  Take care  Dr. Lacinda Axon

## 2016-08-06 NOTE — Assessment & Plan Note (Signed)
Uncontrolled. Lipid panel today. 

## 2016-08-06 NOTE — Assessment & Plan Note (Signed)
Stable although diastolic slightly up (per new guidelines). We'll continue HCTZ.

## 2016-08-06 NOTE — Progress Notes (Signed)
Pre visit review using our clinic review tool, if applicable. No additional management support is needed unless otherwise documented below in the visit note. 

## 2016-08-09 ENCOUNTER — Encounter: Payer: Self-pay | Admitting: Family Medicine

## 2016-08-18 ENCOUNTER — Ambulatory Visit (INDEPENDENT_AMBULATORY_CARE_PROVIDER_SITE_OTHER): Payer: BLUE CROSS/BLUE SHIELD | Admitting: Cardiology

## 2016-08-18 ENCOUNTER — Encounter: Payer: Self-pay | Admitting: Cardiology

## 2016-08-18 VITALS — BP 142/84 | HR 68 | Ht 64.0 in | Wt 151.0 lb

## 2016-08-18 DIAGNOSIS — R55 Syncope and collapse: Secondary | ICD-10-CM | POA: Diagnosis not present

## 2016-08-18 DIAGNOSIS — I10 Essential (primary) hypertension: Secondary | ICD-10-CM | POA: Diagnosis not present

## 2016-08-18 MED ORDER — AMLODIPINE BESYLATE 2.5 MG PO TABS: 2.5000 mg | ORAL_TABLET | Freq: Every day | ORAL | 6 refills | Status: DC

## 2016-08-18 NOTE — Patient Instructions (Addendum)
Medication Instructions:  Your physician has recommended you make the following change in your medication:  1. STOP Hydrochlorothiazide (Hydrodiuril) 2. START Amlodipine 2.5 mg Once daily  Testing/Procedures: Your physician has requested that you have an echocardiogram. Echocardiography is a painless test that uses sound waves to create images of your heart. It provides your doctor with information about the size and shape of your heart and how well your heart's chambers and valves are working. This procedure takes approximately one hour. There are no restrictions for this procedure.  Your physician has requested that you have a stress echocardiogram. For further information please visit [email or web address]. Please follow instruction sheet as given.   Do not drink or eat foods with caffeine for 24 hours before the test. (Chocolate, coffee, tea, or energy drinks)  If you use an inhaler, bring it with you to the test.  Do not smoke for 4 hours before the test.  Wear comfortable shoes and clothing.  Follow-Up: Your physician recommends that you schedule a follow-up appointment as needed with Dr. Yvone Neu. We will call you with results and if needed schedule follow up at that time.  It was a pleasure seeing you today here in the office. Please do not hesitate to give [place] a call back if you have any further questions. [place], BSN    Echocardiogram An echocardiogram, or echocardiography, uses sound waves (ultrasound) to produce an image of your heart. The echocardiogram is simple, painless, obtained within a short period of time, and offers valuable information to your health care provider. The images from an echocardiogram can provide information such as:  Evidence of coronary artery disease (CAD).  Heart size.  Heart muscle function.  Heart valve function.  Aneurysm detection.  Evidence of a past heart attack.  Fluid buildup around the heart.  Heart muscle  thickening.  Assess heart valve function. Tell a health care provider about:  Any allergies you have.  All medicines you are taking, including vitamins, herbs, eye drops, creams, and over-the-counter medicines.  Any problems you or family members have had with anesthetic medicines.  Any blood disorders you have.  Any surgeries you have had.  Any medical conditions you have.  Whether you are pregnant or may be pregnant. What happens before the procedure? No special preparation is needed. Eat and drink normally. What happens during the procedure?  In order to produce an image of your heart, gel will be applied to your chest and a wand-like tool (transducer) will be moved over your chest. The gel will help transmit the sound waves from the transducer. The sound waves will harmlessly bounce off your heart to allow the heart images to be captured in real-time motion. These images will then be recorded.  You may need an IV to receive a medicine that improves the quality of the pictures. What happens after the procedure? You may return to your normal schedule including diet, activities, and medicines, unless your health care provider tells you otherwise. This information is not intended to replace advice given to you by your health care provider. Make sure you discuss any questions you have with your health care provider. Document Released: 09/03/2000 Document Revised: 04/24/2016 Document Reviewed: 05/14/2013 Elsevier Interactive Patient Education  2017 [employer].  Exercise Stress Echocardiogram An exercise stress echocardiogram is a test that checks how well your heart is working. For this test, you will walk on a treadmill to make your heart beat faster. This test uses sound waves (ultrasound)  and a computer to make pictures (images) of your heart. These pictures will be taken before you exercise and after you exercise. What happens before the procedure?  Follow instructions from  your doctor about what you cannot eat or drink before the test.  Do not drink or eat anything that has caffeine in it. Stop having caffeine for 24 hours before the test.  Ask your doctor about changing or stopping your normal medicines. This is important if you take diabetes medicines or blood thinners. Ask your doctor if you should take your medicines with water before the test.  If you use an inhaler, bring it to the test.  Do not use any products that have nicotine or tobacco in them, such as cigarettes and e-cigarettes. Stop using them for 4 hours before the test. If you need help quitting, ask your doctor.  Wear comfortable shoes and clothing. What happens during the procedure?  You will be hooked up to a TV screen. Your doctor will watch the screen to see how fast your heart beats during the test.  Before you exercise, a computer will make a picture of your heart. To do this:  A gel will be put on your chest.  A wand will be moved over the gel.  Sound waves from the wand will go to the computer to make the picture.  Your will start walking on a treadmill. The treadmill will start at a slow speed. It will get faster a little bit at a time. When you walk faster, your heart will beat faster.  The treadmill will be stopped when your heart is working hard.  You will lie down right away so another picture of your heart can be taken.  The test will take 30-60 minutes. What happens after the procedure?  Your heart rate and blood pressure will be watched after the test.  If your doctor says that you can, you may:  Eat what you usually eat.  Do your normal activities.  Take medicines like normal. Summary  An exercise stress echocardiogram is a test that checks how well your heart is working.  Follow instructions about what you cannot eat or drink before the test. Ask your doctor if you should take your normal medicines before the test.  Stop having caffeine for 24 hours  before the test. Do not use anything with nicotine or tobacco in it for 4 hours before the test.  A computer will take a picture of your heart before you walk on a treadmill. It will take another picture when you are done walking.  Your heart rate and blood pressure will be watched after the test. This information is not intended to replace advice given to you by your health care provider. Make sure you discuss any questions you have with your health care provider. Document Released: 07/04/2009 Document Revised: 05/30/2016 Document Reviewed: 05/30/2016 Elsevier Interactive Patient Education  2017 [employer].

## 2016-08-18 NOTE — Progress Notes (Signed)
Cardiology Office Note   Date:  08/18/2016   ID:  Penny Kim, DOB 02-25-1971, MRN [id number]  Referring Doctor:  Rubbie Battiest, NP   Cardiologist:   Wende Bushy, MD   Reason for consultation:  Chief Complaint  Patient presents with  . OTHER    Syncope. Meds reviewed verbally with pt.      History of Present Illness: Penny Kim is a 45 y.o. female who presents forEpisode of passing out. Most recent one was 2 months ago. She will is brushing her teeth in her bathroom, early in the morning upon waking up. She then noted vision turning dark and the next thing she knew, her face hit the wall between them never in the bathroom sink. No prior episode of chest pain or shortness of breath or palpitations right before the episode.  Apparently, this has been going on for quite some time now. She can remember as far back as 2012. She would have multiple episodes of feeling lightheaded and having to [place] onto something. Usually happens on upright position. She also reports that in the early morning, when she has to go to the bathroom, she would feel somewhat biased from sitting up and standing from a lying position.  She does have some chest pains or she feels this is related to her being depressed from her mother's passing at age 45 a year ago.  She also reports shortness breath with exertion. Mild in intensity, remaining in the chest, nonradiating, a few minutes, resolve with rest.   ROS:  Please see the history of present illness. Aside from mentioned under HPI, all other systems are reviewed and negative.     Past Medical History:  Diagnosis Date  . Allergy   . Anxiety   . Breast cancer, right breast ([place]) 02/2010   s/p chemo (completed 09/2010) and right mastectomy w/reconstruction  . Cancer ([place])   . Depression   . HTN (hypertension)     Past Surgical History:  Procedure Laterality Date  . BREAST RECONSTRUCTION  01/06/2012   Procedure: BREAST RECONSTRUCTION;   Surgeon: Crissie Reese, MD;  Location: [facility];  Service: Plastics;  Laterality: Bilateral;  bilateral removal of tissue expanders, bilateral placement of implants--Breast  . BREAST SURGERY  06/19/2010   exploration of right mastectomy site due to post-op bleeding  . INSERTION OF TISSUE EXPANDER AFTER MASTECTOMY  06/19/2010   bilat. mastectomies with bilat. tissue expanders  . PORT-A-CATH REMOVAL  11/26/2010  . PORTACATH PLACEMENT  07/23/2010  . TISSUE EXPANDER REMOVAL  10/20/2010   left     reports that she has never smoked. She has never used smokeless tobacco. She reports that she drinks about 0.6 oz of alcohol per week . She reports that she does not use drugs.   family history includes Alcohol abuse in her father; Cancer in her maternal grandfather; Cancer (age of onset: 57) in her sister; Depression in her father and mother; Diabetes in her maternal grandmother and paternal grandmother; Drug abuse (age of onset: 64) in her father; Heart attack in her mother; Hyperlipidemia in her mother and paternal grandmother; Hypertension in her maternal grandfather, maternal grandmother, mother, and paternal grandmother; Kidney disease in her paternal grandmother.   Outpatient Medications Prior to Visit  Medication Sig Dispense Refill  . clonazePAM (KLONOPIN) 1 MG tablet Take 1 mg by mouth 2 (two) times daily. Pt takes  1-2 tablets daily    . Ferrous Sulfate (IRON) 28 MG TABS  Take 1 tablet by mouth daily.    . Melatonin 5 MG TABS Take 1 tablet by mouth at bedtime as needed.    . sertraline (ZOLOFT) 100 MG tablet Take 100 mg by mouth daily. Reported on 09/11/2015    . tamoxifen (NOLVADEX) 20 MG tablet TAKE 1 TABLET BY MOUTH EVERY DAY 150 tablet 0  . hydrochlorothiazide (HYDRODIURIL) 25 MG tablet TAKE 1/2 TABLET DAILY IN THE MORNING. 30 tablet 6  . Ascorbic Acid (VITAMIN C) 1000 MG tablet Take 1,000 mg by mouth daily.    . Cholecalciferol (VITAMIN D3) 2000 UNITS TABS Take 1 tablet by mouth.     . lamoTRIgine (LAMICTAL) 150 MG tablet Take 150 mg by mouth daily. Reported on 09/11/2015    . Multiple Vitamins-Minerals (MULTI-VITAMIN GUMMIES) CHEW Chew 1 Dose by mouth daily.    . Omega-3 Fatty Acids (FISH OIL ADULT GUMMIES PO) Take 1 Dose by mouth daily.    . Probiotic Product (PROBIOTIC ADVANCED PO) Take 1 tablet by mouth daily.     No facility-administered medications prior to visit.      Allergies: Prochlorperazine; Methadone hcl; and Tape    PHYSICAL EXAM: VS:  BP (!) 142/84 (BP Location: Left Arm, Patient Position: Sitting, Cuff Size: Normal)   Pulse 68   Ht 5\' 4"  (1.626 m)   Wt 151 lb (68.5 kg)   BMI 25.92 kg/m  , Body mass index is 25.92 kg/m. Wt Readings from Last 3 Encounters:  08/18/16 151 lb (68.5 kg)  08/06/16 148 lb 6 oz (67.3 kg)  10/13/15 142 lb 4.8 oz (64.5 kg)    Orthostatic VS for the past 24 hrs:  BP- Lying Pulse- Lying BP- Sitting Pulse- Sitting BP- Standing at 0 minutes Pulse- Standing at 0 minutes  08/18/16 1410 141/86 68 127/85 69 126/87 72    Standing further: 117/84, 87  GENERAL:  well developed, well nourished, not in acute distress HEENT: normocephalic, pink conjunctivae, anicteric sclerae, no xanthelasma, normal dentition, oropharynx clear NECK:  no neck vein engorgement, JVP normal, no hepatojugular reflux, carotid upstroke brisk and symmetric, no bruit, no thyromegaly, no lymphadenopathy LUNGS:  good respiratory effort, clear to auscultation bilaterally CV:  PMI not displaced, no thrills, no lifts, S1 and S2 within normal limits, no palpable S3 or S4, no murmurs, no rubs, no gallops ABD:  Soft, nontender, nondistended, normoactive bowel sounds, no abdominal aortic bruit, no hepatomegaly, no splenomegaly MS: nontender back, no kyphosis, no scoliosis, no joint deformities EXT:  2+ DP/PT pulses, no edema, no varicosities, no cyanosis, no clubbing SKIN: warm, nondiaphoretic, normal turgor, no ulcers NEUROPSYCH: alert, oriented to person,  place, and time, sensory/motor grossly intact, normal mood, appropriate affect  Recent Labs: 08/06/2016: ALT 11; BUN 12; Creatinine, Ser 0.87; Hemoglobin 13.0; Platelets 199.0; Potassium 3.5; Sodium 141; TSH 1.90   Lipid Panel    Component Value Date/Time   CHOL 250 (H) 08/06/2016 0846   TRIG 172.0 (H) 08/06/2016 0846   HDL 61.60 08/06/2016 0846   CHOLHDL 4 08/06/2016 0846   VLDL 34.4 08/06/2016 0846   LDLCALC 154 (H) 08/06/2016 0846     Other studies Reviewed:  EKG:  The ekg from 08/18/2016 was personally reviewed by me and it revealed sinus rhythm, 68 BPM  Additional studies/ records that were reviewed personally reviewed by me today include: None available   ASSESSMENT AND PLAN: Loss of consciousness Orthostatic vital signs demonstrated orthostatic hypotension. History is consistent with this process as well. We discussed importance of proper hydration  and currently she only drinks up to 32 ounces of water a day. On top of that, she takes hydrochlorothiazide for hypertension. Recommend to discontinue this and start amlodipine 2.5 mg daily. We discussed importance of proper and adequate nutrition.  In terms of the shortness of breath Risk factors for CAD include prediabetes stage, hypertension, family history of CAD possibly in her mother Recommend echo car to [place] and stress echocardiogram   Current medicines are reviewed at length with the patient today.  The patient does not have concerns regarding medicines.  Labs/ tests ordered today include:  Orders Placed This Encounter  Procedures  . EKG 12-Lead  . ECHOCARDIOGRAM COMPLETE  . ECHOCARDIOGRAM STRESS TEST    I had a lengthy and detailed discussion with the patient regarding diagnoses, prognosis, diagnostic options, treatment options , and side effects of medications.   I counseled the patient on importance of lifestyle modification including heart healthy diet, regular physical activity .   Disposition:    FU with undersigned after tests   Signed, Wende Bushy, MD  08/18/2016 2:58 PM    [place]  This note was generated in part with voice recognition software and I apologize for any typographical errors that were not detected and corrected.

## 2016-09-17 ENCOUNTER — Other Ambulatory Visit: Payer: Self-pay

## 2016-10-02 ENCOUNTER — Other Ambulatory Visit: Payer: Self-pay | Admitting: Oncology

## 2016-10-05 ENCOUNTER — Encounter: Payer: Self-pay | Admitting: Family Medicine

## 2016-10-08 ENCOUNTER — Other Ambulatory Visit: Payer: Self-pay | Admitting: *Deleted

## 2016-10-08 ENCOUNTER — Other Ambulatory Visit: Payer: Self-pay

## 2016-10-08 DIAGNOSIS — C50211 Malignant neoplasm of upper-inner quadrant of right female breast: Secondary | ICD-10-CM

## 2016-10-11 ENCOUNTER — Ambulatory Visit: Payer: Self-pay | Admitting: Oncology

## 2016-10-11 ENCOUNTER — Encounter: Payer: Self-pay | Admitting: Cardiology

## 2016-10-11 ENCOUNTER — Encounter: Payer: Self-pay | Admitting: Oncology

## 2016-10-11 ENCOUNTER — Emergency Department
Admission: EM | Admit: 2016-10-11 | Discharge: 2016-10-11 | Disposition: A | Discharge status: Home / Self Care | Payer: BLUE CROSS/BLUE SHIELD | Attending: Emergency Medicine | Admitting: Emergency Medicine

## 2016-10-11 ENCOUNTER — Encounter: Payer: Self-pay | Admitting: Emergency Medicine

## 2016-10-11 ENCOUNTER — Other Ambulatory Visit: Payer: Self-pay

## 2016-10-11 DIAGNOSIS — Z170 Estrogen receptor positive status [ER+]: Secondary | ICD-10-CM | POA: Insufficient documentation

## 2016-10-11 DIAGNOSIS — Z853 Personal history of malignant neoplasm of breast: Secondary | ICD-10-CM | POA: Insufficient documentation

## 2016-10-11 DIAGNOSIS — C50211 Malignant neoplasm of upper-inner quadrant of right female breast: Secondary | ICD-10-CM | POA: Insufficient documentation

## 2016-10-11 DIAGNOSIS — Z79899 Other long term (current) drug therapy: Secondary | ICD-10-CM | POA: Insufficient documentation

## 2016-10-11 DIAGNOSIS — E10621 Type 1 diabetes mellitus with foot ulcer: Secondary | ICD-10-CM | POA: Insufficient documentation

## 2016-10-11 DIAGNOSIS — L97511 Non-pressure chronic ulcer of other part of right foot limited to breakdown of skin: Secondary | ICD-10-CM | POA: Insufficient documentation

## 2016-10-11 DIAGNOSIS — I10 Essential (primary) hypertension: Secondary | ICD-10-CM | POA: Insufficient documentation

## 2016-10-11 MED ORDER — NAPROXEN 500 MG PO TBEC: 500.0000 mg | DELAYED_RELEASE_TABLET | Freq: Two times a day (BID) | ORAL | 0 refills | Status: AC

## 2016-10-11 NOTE — ED Provider Notes (Signed)
[facility] Emergency Department Provider Note  ____________________________________________  Time seen: Approximately 5:12 PM  I have reviewed the triage vital signs and the nursing notes.   HISTORY  Chief Complaint No chief complaint on file.    HPI Penny Kim is a 46 y.o. female presenting to the emergency department with right foot pain. Patient states that she has experienced cracking, dryness and discoloration along the plantar aspect of her right foot for the past several months. In addition, she reports a "tingling sensation" at the plantar aspect of the right foot. Patient states that she has been diagnosed with prediabetes. Patient denies fever, increased warmth or streaking. Patient states that her 3/10 right foot pain occurs when she is ambulating for prolonged amounts of time. She has tried several over-the-counter creams. She is currently not under the care of podiatry.   Past Medical History:  Diagnosis Date  . Allergy   . Anxiety   . Breast cancer, right breast ([place]) 02/2010   s/p chemo (completed 09/2010) and right mastectomy w/reconstruction  . Cancer ([place])   . Depression   . HTN (hypertension)     Patient Active Problem List   Diagnosis Date Noted  . Malignant neoplasm of upper-inner quadrant of right breast in female, estrogen receptor positive ([place]) 10/11/2016  . Prediabetes 08/06/2016  . Syncope 08/06/2016  . Genetic testing 10/07/2015  . Hyperlipidemia 09/11/2015  . Avascular necrosis of bones of both hips ([place]) 11/22/2014  . Essential hypertension 07/30/2013  . Anxiety and depression 07/30/2013  . Breast cancer of upper-inner quadrant of right female breast ([place]) 08/24/2011    Past Surgical History:  Procedure Laterality Date  . BREAST RECONSTRUCTION  01/06/2012   Procedure: BREAST RECONSTRUCTION;  Surgeon: Crissie Reese, MD;  Location: [facility];  Service: Plastics;  Laterality: Bilateral;  bilateral removal  of tissue expanders, bilateral placement of implants--Breast  . BREAST SURGERY  06/19/2010   exploration of right mastectomy site due to post-op bleeding  . INSERTION OF TISSUE EXPANDER AFTER MASTECTOMY  06/19/2010   bilat. mastectomies with bilat. tissue expanders  . PORT-A-CATH REMOVAL  11/26/2010  . PORTACATH PLACEMENT  07/23/2010  . TISSUE EXPANDER REMOVAL  10/20/2010   left    Prior to Admission medications   Medication Sig Start Date End Date Taking? Authorizing Provider  amLODipine (NORVASC) 2.5 MG tablet Take 1 tablet (2.5 mg total) by mouth daily. 08/18/16 11/16/16  Wende Bushy, MD  clonazePAM (KLONOPIN) 1 MG tablet Take 1 mg by mouth 2 (two) times daily. Pt takes  1-2 tablets daily    Historical Provider, MD  Ferrous Sulfate (IRON) 28 MG TABS Take 1 tablet by mouth daily.    Historical Provider, MD  Melatonin 5 MG TABS Take 1 tablet by mouth at bedtime as needed.    Historical Provider, MD  naproxen (EC NAPROSYN) 500 MG EC tablet Take 1 tablet (500 mg total) by mouth 2 (two) times daily with a meal. 10/11/16 10/18/16  Lannie Fields, PA-C  pseudoephedrine (SUDAFED) 30 MG tablet Take 30 mg by mouth every 4 (four) hours as needed for congestion.    Historical Provider, MD  sertraline (ZOLOFT) 100 MG tablet Take 100 mg by mouth daily. Reported on 09/11/2015    Historical Provider, MD  tamoxifen (NOLVADEX) 20 MG tablet TAKE 1 TABLET BY MOUTH EVERY DAY 10/04/16   Chauncey Cruel, MD    Allergies Prochlorperazine; Methadone hcl; and Tape  Family History  Problem Relation Age of  Onset  . Hypertension Mother   . Depression Mother   . Heart attack Mother   . Hyperlipidemia Mother   . Cancer Sister 30    Ovarian cancer  . Hypertension Maternal Grandmother   . Diabetes Maternal Grandmother   . 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 Hypertension Maternal Grandfather   . Diabetes Paternal Grandmother   . Hypertension Paternal Grandmother   . Kidney  disease Paternal Grandmother     End stage renal dz - dialysis  . Hyperlipidemia Paternal Grandmother   . Alcohol abuse Father   . Depression Father   . Drug abuse Father 81    Died of drug overdose    Social History Social History  Substance Use Topics  . Smoking status: Never Smoker  . Smokeless tobacco: Never Used  . Alcohol use 0.6 oz/week    1 Glasses of wine per week     Comment: occasionally     Review of Systems  Constitutional: No fever/chills Cardiovascular: no chest pain. Respiratory: no cough. No SOB. Gastrointestinal: No abdominal pain.  No nausea, no vomiting.  No diarrhea.  No constipation. Musculoskeletal: Negative for musculoskeletal pain. Skin: Patient has fissured skin along the perimeter of the plantar aspect of her right foot.  Neurological: Patient has symptoms of right foot neuropathy.  ____________________________________________   PHYSICAL EXAM:  VITAL SIGNS: ED Triage Vitals  Enc Vitals Group     BP 10/11/16 1609 115/78     Pulse Rate 10/11/16 1609 87     Resp 10/11/16 1609 18     Temp 10/11/16 1609 98.5 F (36.9 C)     Temp Source 10/11/16 1609 Oral     SpO2 10/11/16 1609 96 %     Weight 10/11/16 1609 150 lb (68 kg)     Height 10/11/16 1609 5\' 5"  (1.651 m)     Head Circumference --      Peak Flow --      Pain Score 10/11/16 1613 0     Pain Loc --      Pain Edu? --      Excl. in [place]? --      Constitutional: Alert and oriented. Well appearing and in no acute distress. Cardiovascular: Normal rate, regular rhythm. Normal S1 and S2.  Good peripheral circulation. Respiratory: Normal respiratory effort without tachypnea or retractions. Lungs CTAB. Good air entry to the bases with no decreased or absent breath sounds. Musculoskeletal: Full range of motion to all extremities. No gross deformities appreciated. Neurologic:  Normal speech and language. No gross focal neurologic deficits are appreciated.  Skin: Skin overlying the right foot is  without significant warmth, erythema or streaking. Patient has fissured skin along the perimeter of the plantar aspect of her right foot. Pinpoint hemorrhages and shallow hematomas can be visualized within the fissured deep layers of the dermis. Psychiatric: Mood and affect are normal. Speech and behavior are normal. Patient exhibits appropriate insight and judgement.   ____________________________________________   LABS (all labs ordered are listed, but only abnormal results are displayed)  Labs Reviewed - No data to display ____________________________________________  EKG   ____________________________________________  RADIOLOGY   No results found.  ____________________________________________    PROCEDURES  Procedure(s) performed:    Procedures    Medications - No data to display   ____________________________________________   INITIAL IMPRESSION / ASSESSMENT AND PLAN / ED COURSE  Pertinent labs & imaging results that were available during my  care of the patient were reviewed by me and considered in my medical decision making (see chart for details).  Review of the [place] CSRS was performed in accordance of the [place] prior to dispensing any controlled drugs.    Assessment and Plan:  Diabetic foot ulcer: On physical exam, patient has fissured skin along the perimeter of the plantar aspect of her right foot. Pinpoint hemorrhages and shallow hematomas can be visualized within the fissured deep layers of the dermis. Patient reports a tingling sensation at the plantar aspect of her right foot. Patient's neurologic exam was reassuring with normal sensation. I suspect patient has an early diabetic foot ulcer. Patient was referred to podiatry, Dr. Elvina Mattes and discharged with naproxen for pain. Patient has been tolerating ibuprofen well. All patient questions were answered. ____________________________________________  FINAL CLINICAL IMPRESSION(S) / ED DIAGNOSES  Final  diagnoses:  Diabetic ulcer of right foot associated with type 1 diabetes mellitus, limited to breakdown of skin, unspecified part of foot ([place])      NEW MEDICATIONS STARTED DURING THIS VISIT:  Discharge Medication List as of 10/11/2016  5:22 PM    START taking these medications   Details  naproxen (EC NAPROSYN) 500 MG EC tablet Take 1 tablet (500 mg total) by mouth 2 (two) times daily with a meal., Starting Mon 10/11/2016, Until Mon 10/18/2016, Print            This chart was dictated using voice recognition software/Dragon. Despite best efforts to proofread, errors can occur which can change the meaning. Any change was purely unintentional.    Lannie Fields, PA-C 10/12/16 1442    Harvest Dark, MD 10/15/16 [phone]

## 2016-10-11 NOTE — ED Triage Notes (Signed)
C/O right foot wound since 2012.  C/O "extreme dryness, cracking, and discoloration".  Patient states symptoms come and go and seem to get worse with cold weather.  Dry skin with cracking to right foot.

## 2016-10-18 ENCOUNTER — Encounter: Payer: Self-pay | Admitting: Family Medicine

## 2016-10-19 ENCOUNTER — Encounter: Payer: Self-pay | Admitting: Family

## 2016-10-19 ENCOUNTER — Ambulatory Visit (INDEPENDENT_AMBULATORY_CARE_PROVIDER_SITE_OTHER): Payer: BLUE CROSS/BLUE SHIELD | Admitting: Family

## 2016-10-19 VITALS — BP 98/68 | HR 86 | Temp 97.7°F | Ht 65.0 in | Wt 154.0 lb

## 2016-10-19 DIAGNOSIS — R21 Rash and other nonspecific skin eruption: Secondary | ICD-10-CM | POA: Diagnosis not present

## 2016-10-19 MED ORDER — PREDNISONE 10 MG PO TABS: ORAL_TABLET | ORAL | 0 refills | Status: DC

## 2016-10-19 MED ORDER — METHYLPREDNISOLONE ACETATE 40 MG/ML IJ SUSP
40.0000 mg | Freq: Once | INTRAMUSCULAR | Status: AC
  Administered 2016-10-19: 40 mg via INTRAMUSCULAR

## 2016-10-19 NOTE — Progress Notes (Signed)
Pre visit review using our clinic review tool, if applicable. No additional management support is needed unless otherwise documented below in the visit note. 

## 2016-10-19 NOTE — Patient Instructions (Signed)
Suspect naprosyn- advise to stop  Pepcid OTC twice per day Claritin or Zyrtec OTC daily Start prednisone tomorrow  If worsens, or doesn't respond, please call asap   Drug Rash Introduction A drug rash is a change in the color or texture of the skin that is caused by a drug. It can develop minutes, hours, or days after the person takes the drug. What are the causes? This condition is usually caused by a drug allergy. It can also be caused by exposure to sunlight after taking a drug that makes the skin sensitive to light. Drugs that commonly cause rashes include:  Penicillin.  Antibiotic medicines.  Medicines that treat seizures.  Medicines that treat cancer (chemotherapy).  Aspirin and other nonsteroidal anti-inflammatory drugs (NSAIDs).  Injectable dyes that contain iodine.  Insulin. What are the signs or symptoms? Symptoms of this condition include:  Redness.  Tiny bumps.  Peeling.  Itching.  Itchy welts (hives).  Swelling. The rash may appear on a small area of skin or all over the body. How is this diagnosed? To diagnose the condition, your health care provider will do a physical exam. He or she may also order tests to find out which drug caused the rash. Tests to find the cause of a rash include:  Skin tests.  Blood tests.  Drug challenge. For this test, you stop taking all of the drugs that you do not need to take, and then you start taking them again by adding back one of the drugs at a time. How is this treated? A drug rash may be treated with medicines, including:  Antihistamines. These may be given to relieve itching.  An NSAID. This may be given to reduce swelling and treat pain.  A steroid drug. This may be given to reduce swelling. The rash usually goes away when the person stops taking the drug that caused it. Follow these instructions at home:  Take medicines only as directed by your health care provider.  Let all of your health care  providers know about any drug reactions you have had in the past.  If you have hives, take a cool shower or use a cool compress to relieve itchiness. Contact a health care provider if:  You have a fever.  Your rash is not going away.  Your rash gets worse.  Your rash comes back.  You have wheezing or coughing. Get help right away if:  You start to have breathing problems.  You start to have shortness of breath.  You face or throat starts to swell.  You have severe weakness with dizziness or fainting.  You have chest pain. This information is not intended to replace advice given to you by your health care provider. Make sure you discuss any questions you have with your health care provider. Document Released: 10/14/2004 Document Revised: 02/12/2016 Document Reviewed: 07/03/2014  2017 Elsevier

## 2016-10-19 NOTE — Telephone Encounter (Signed)
Patient woke up 1/29/*18 in the afternoon with rash raised large areas see my chart message attached photos, Patient has continued to break out now spread down right leg. Have scheduled patient with NP for today at 2.45, only new item is patient ate pizza over the weekend and is using a detox diet.

## 2016-10-19 NOTE — Progress Notes (Signed)
Subjective:    Patient ID: Penny Kim, female    DOB: [birth year], 46 y.o.   MRN: [id number]  CC: Penny Kim is a 46 y.o. female who presents today for an acute visit.    HPI: CC: rash started on right trunk, yesterday and now spread to entire body. Very itchy.   Has tried calamine with improvement. NO SOB, trouble swallowing. No new lotions, creams. Ate red meat on pizza and normally doesn't eat a lot of red meat.  'Binged on pizza.'  Also had green peppers. No tick bite. Doesn't eat meat as effort to 'be healthy', no allergies to meat known of in the past. No h/o gluten sensitivity. Started naprosyn one week ago for left ankle pain.     No h/o asthma, eczema, or seasonal allergies.     HISTORY:  Past Medical History:  Diagnosis Date  . Allergy   . Anxiety   . Breast cancer, right breast ([place]) 02/2010   s/p chemo (completed 09/2010) and right mastectomy w/reconstruction  . Cancer ([facility])   . Depression   . HTN (hypertension)    Past Surgical History:  Procedure Laterality Date  . BREAST RECONSTRUCTION  01/06/2012   Procedure: BREAST RECONSTRUCTION;  Surgeon: Penny Reese, MD;  Location: [facility];  Service: Plastics;  Laterality: Bilateral;  bilateral removal of tissue expanders, bilateral placement of implants--Breast  . BREAST SURGERY  06/19/2010   exploration of right mastectomy site due to post-op bleeding  . INSERTION OF TISSUE EXPANDER AFTER MASTECTOMY  06/19/2010   bilat. mastectomies with bilat. tissue expanders  . PORT-A-CATH REMOVAL  11/26/2010  . PORTACATH PLACEMENT  07/23/2010  . TISSUE EXPANDER REMOVAL  10/20/2010   left   Family History  Problem Relation Age of Onset  . Hypertension Mother   . Depression Mother   . Heart attack Mother   . Hyperlipidemia Mother   . Cancer Sister 30    Ovarian cancer  . Hypertension Maternal Grandmother   . Diabetes Maternal Grandmother   . 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 Hypertension Maternal Grandfather   . Diabetes Paternal Grandmother   . Hypertension Paternal Grandmother   . Kidney disease Paternal Grandmother     End stage renal dz - dialysis  . Hyperlipidemia Paternal Grandmother   . Alcohol abuse Father   . Depression Father   . Drug abuse Father 16    Died of drug overdose    Allergies: Prochlorperazine; Methadone hcl; and Tape Current Outpatient Prescriptions on File Prior to Visit  Medication Sig Dispense Refill  . amLODipine (NORVASC) 2.5 MG tablet Take 1 tablet (2.5 mg total) by mouth daily. 30 tablet 6  . clonazePAM (KLONOPIN) 1 MG tablet Take 1 mg by mouth 2 (two) times daily. Pt takes  1-2 tablets daily    . sertraline (ZOLOFT) 100 MG tablet Take 100 mg by mouth daily. Reported on 09/11/2015    . tamoxifen (NOLVADEX) 20 MG tablet TAKE 1 TABLET BY MOUTH EVERY DAY 150 tablet 0   No current facility-administered medications on file prior to visit.     Social History  Substance Use Topics  . Smoking status: Never Smoker  . Smokeless tobacco: Never Used  . Alcohol use 0.6 oz/week    1 Glasses of wine per week     Comment: occasionally    Review of Systems  Constitutional: Negative for chills and fever.  HENT: Negative  for trouble swallowing.   Respiratory: Negative for cough and shortness of breath.   Cardiovascular: Negative for chest pain and palpitations.  Gastrointestinal: Negative for nausea and vomiting.  Skin: Positive for rash.      Objective:    BP 98/68   Pulse 86   Temp 97.7 F (36.5 C) (Oral)   Ht 5\' 5"  (1.651 m)   Wt 154 lb (69.9 kg)   SpO2 98%   BMI 25.63 kg/m    Physical Exam  Constitutional: She appears well-developed and well-nourished.  HENT:  Mouth/Throat: Oropharynx is clear and moist and mucous membranes are normal. No oropharyngeal exudate, posterior oropharyngeal edema or posterior oropharyngeal erythema.  No oral lesions.   Eyes: Conjunctivae are normal.  Cardiovascular: Normal rate,  regular rhythm, normal heart sounds and normal pulses.   Pulmonary/Chest: Effort normal and breath sounds normal. She has no wheezes. She has no rhonchi. She has no rales.  Neurological: She is alert.  Skin: Skin is warm and dry.  Diffuse serpiginous erythematous wheels over neck, trunk, arms, and legs. Non tender. No vesicles.  Psychiatric: She has a normal mood and affect. Her speech is normal and behavior is normal. Thought content normal.  Vitals reviewed.      Assessment & Plan:   1. Rash Consistent with urticaria, pruritus ( see picture in chart from [employer]). Suspect naprosyn as only new medication patient has started. No recent tick bite to warrant investigation for underlying meat allergy. No known gluten sensitivity. No SOB or angioedema. No  mucosal involvement, skin tenderness, blistering, fever to suggest SJS. Maintain close vigilance. Patient will start H1 and h2 blocker with prednisone taper tomorrow.   - methylPREDNISolone acetate (DEPO-MEDROL) injection 40 mg; Inject 1 mL (40 mg total) into the muscle once. - predniSONE (DELTASONE) 10 MG tablet; Take 4 tablets ( total 40 mg) by mouth for 2 days; take 3 tablets ( total 30 mg) by mouth for 2 days; take 2 tablets ( total 20 mg) by mouth for 1 day; take 1 tablet ( total 10 mg) by mouth for 1 day.  Dispense: 17 tablet; Refill: 0    I have discontinued Ms. Carpenito's Melatonin, Iron, and pseudoephedrine. I am also having her maintain her clonazePAM, sertraline, amLODipine, tamoxifen, hydrochlorothiazide, fluticasone, and calamine.   Meds ordered this encounter  Medications  . hydrochlorothiazide (HYDRODIURIL) 25 MG tablet    Refill:  5  . fluticasone (FLONASE) 50 MCG/ACT nasal spray    Sig: Place into both nostrils daily.  . calamine lotion    Sig: Apply 1 application topically as needed for itching.    Return precautions given.   Risks, benefits, and alternatives of the medications and treatment plan prescribed  today were discussed, and patient expressed understanding.   Education regarding symptom management and diagnosis given to patient on AVS.  Continue to follow with Coral Spikes, DO for routine health maintenance.   Penny Kim and I agreed with plan.   Mable Paris, FNP

## 2016-10-20 NOTE — Progress Notes (Signed)
Unable to reach patient there is not a current phone number that we can reach patient.

## 2016-10-21 ENCOUNTER — Telehealth: Payer: Self-pay | Admitting: *Deleted

## 2016-10-21 NOTE — Telephone Encounter (Signed)
Patient was seen in the emergency room on 10/11/16([facility]). Pt was give a excused note from work to cover her through 10/11/16-10/13/16. Pt had to stay home the remainder of the week, due to not feeling well enough to return to work. She has requested to have a not to return to work on 02/02 explaning her issues and reasons she CAN return to work. Pt is willing to come in to be seen, if she needs an appt please give a time on 02/02 schedule with Dr. Lacinda Axon . Pt stated that she will need to be seen before 3pm.  Pt contact [phone]

## 2016-10-22 NOTE — Telephone Encounter (Signed)
Please get the exact dates that she needs to be excused from.  Was this all regarding the issues with her feet?

## 2016-10-22 NOTE — Telephone Encounter (Signed)
Do you want her to be seen?

## 2016-10-22 NOTE — Telephone Encounter (Signed)
She will need to be excused from 10/14/16-10/21/16. The letter also needs to state that she is able to return to work today 10/22/16. Pt was seen for her feet and allergy to treatment of her feet.

## 2016-10-26 ENCOUNTER — Other Ambulatory Visit: Payer: Self-pay | Admitting: Family Medicine

## 2016-10-26 ENCOUNTER — Encounter: Payer: Self-pay | Admitting: Family Medicine

## 2016-11-08 ENCOUNTER — Ambulatory Visit: Payer: BLUE CROSS/BLUE SHIELD

## 2016-11-08 ENCOUNTER — Other Ambulatory Visit: Payer: Self-pay

## 2016-11-08 ENCOUNTER — Ambulatory Visit (INDEPENDENT_AMBULATORY_CARE_PROVIDER_SITE_OTHER): Payer: BLUE CROSS/BLUE SHIELD

## 2016-11-08 DIAGNOSIS — R55 Syncope and collapse: Secondary | ICD-10-CM | POA: Diagnosis not present

## 2016-11-08 LAB — ECHOCARDIOGRAM COMPLETE
CHL CUP DOP CALC LVOT VTI: 20.2 cm
CHL CUP MV DEC (S): 206
CHL CUP REG VEL DIAS: 77.4 cm/s
CHL CUP RV SYS PRESS: 23 mmHg
CHL CUP TV REG PEAK VELOCITY: 221 cm/s
E decel time: 206 msec
E/e' ratio: 8.36
FS: 31 % (ref 28–44)
IVS/LV PW RATIO, ED: 0.67
LA ID, A-P, ES: 28 mm
LA vol A4C: 42.8 ml
LA vol index: 19.9 mL/m2
LADIAMINDEX: 1.58 cm/m2
LAVOL: 35.3 mL
LEFT ATRIUM END SYS DIAM: 28 mm
LV SIMPSON'S DISK: 57
LV TDI E'MEDIAL: 8.16
LV dias vol index: 18 mL/m2
LV dias vol: 32 mL — AB (ref 46–106)
LV e' LATERAL: 10.1 cm/s
LV sys vol index: 8 mL/m2
LV sys vol: 14 mL (ref 14–42)
LVEEAVG: 8.36
LVEEMED: 8.36
LVOT SV: 51 mL
LVOT area: 2.54 cm2
LVOT diameter: 18 mm
Lateral S' vel: 10.1 cm/s
MVPG: 3 mmHg
MVPKAVEL: 54.8 m/s
MVPKEVEL: 84.4 m/s
PW: 9 mm — AB (ref 0.6–1.1)
Stroke v: 18 ml
TAPSE: 14.6 mm
TDI e' lateral: 10.1
TRMAXVEL: 221 cm/s

## 2016-11-09 ENCOUNTER — Telehealth: Payer: Self-pay | Admitting: *Deleted

## 2016-11-09 DIAGNOSIS — R55 Syncope and collapse: Secondary | ICD-10-CM

## 2016-11-09 NOTE — Telephone Encounter (Signed)
-----   Message from Wende Bushy, MD sent at 11/09/2016  9:48 AM EST ----- Could we please order a nuclear stress test for her instead? Reason for it would be that she will be a technically difficult study as resting echo already difficult due to B breast implants. Thank you.  ----- Message ----- From: Georgiana Shore, RN Sent: 11/08/2016  10:46 AM To: Wende Bushy, MD  Ms. Turcios was here today for scheduled echo and stress echo.  The treadmill was malfunctioning but she had the echo. Per Leafy Ro, echo tech, patient is not a good candidate for the stress echo due to bilateral breast implants. She was having difficulty getting clear images during the echo.   Please let me know if you would like to order another test.

## 2016-11-10 ENCOUNTER — Ambulatory Visit: Payer: Self-pay | Admitting: Family Medicine

## 2016-11-10 ENCOUNTER — Telehealth: Payer: Self-pay | Admitting: Family Medicine

## 2016-11-10 DIAGNOSIS — Z0289 Encounter for other administrative examinations: Secondary | ICD-10-CM

## 2016-11-10 NOTE — Telephone Encounter (Signed)
Please leave on schedule.

## 2016-11-10 NOTE — Telephone Encounter (Signed)
Left voicemail message to call back  

## 2016-11-10 NOTE — Telephone Encounter (Signed)
Ok. Np.

## 2016-11-10 NOTE — Telephone Encounter (Signed)
Pt called back and resch her appt from today. Pt is sch for 02/22 at 10am. Do you want appt from today to be cancelled?

## 2016-11-11 ENCOUNTER — Telehealth: Payer: Self-pay | Admitting: Family Medicine

## 2016-11-11 ENCOUNTER — Encounter: Payer: Self-pay | Admitting: *Deleted

## 2016-11-11 ENCOUNTER — Ambulatory Visit: Payer: Self-pay | Admitting: Family Medicine

## 2016-11-11 DIAGNOSIS — Z0289 Encounter for other administrative examinations: Secondary | ICD-10-CM

## 2016-11-11 NOTE — Telephone Encounter (Signed)
What would you like to do moving forward?

## 2016-11-11 NOTE — Telephone Encounter (Signed)
Need to proceed with dismissal.

## 2016-11-11 NOTE — Telephone Encounter (Signed)
Pt has NO SHOWED twice this week.. Please advise

## 2016-11-11 NOTE — Telephone Encounter (Signed)
Letter with instructions to call for results and to schedule stress test was sent to patient along with instructions for stress test.

## 2016-11-11 NOTE — Telephone Encounter (Signed)
Spoke with patient and reviewed results and recommendations with her. Scheduled her to have exercise myoview at [facility] next week and reviewed all instructions with her for that test. She verbalized understanding of our conversation, agreement with plan of care, and had no further questions at this time.

## 2016-11-11 NOTE — Addendum Note (Signed)
Addended by: Valora Corporal on: 11/11/2016 12:20 PM   Modules accepted: Orders

## 2016-11-11 NOTE — Telephone Encounter (Signed)
Left voicemail message to call back to review results and schedule stress test. Will also send her a letter with this information.

## 2016-11-16 ENCOUNTER — Telehealth: Payer: Self-pay | Admitting: Family Medicine

## 2016-11-16 NOTE — Telephone Encounter (Signed)
Patient dismissed from [facility] by Thersa Salt DO, effective November 12, 2016 Dismissal letter sent out by certified / registered mail. DAJ

## 2016-11-17 ENCOUNTER — Telehealth: Payer: Self-pay | Admitting: *Deleted

## 2016-11-17 ENCOUNTER — Telehealth: Payer: Self-pay | Admitting: Cardiology

## 2016-11-17 ENCOUNTER — Telehealth: Payer: Self-pay | Admitting: Oncology

## 2016-11-17 DIAGNOSIS — R55 Syncope and collapse: Secondary | ICD-10-CM

## 2016-11-17 NOTE — Telephone Encounter (Signed)
Pt called to r/s missed [facility] appt in January. R/s appt with [facility] due to [facility] has no availability. Pt has new appt date/time.

## 2016-11-17 NOTE — Telephone Encounter (Signed)
Left voicemail message for patient to call back so we can review instructions and that she will also need to go a little earlier that day to have pregnancy test done. Order entered for this and will review with her when she calls back.

## 2016-11-17 NOTE — Telephone Encounter (Signed)
Left voicemail message for patient to call back regarding testing on Friday.

## 2016-11-17 NOTE — Telephone Encounter (Signed)
Pt has a dismissal letter dated for 11/12/16. She has requested to be scheduled to have a follow up in regards to her diabetes and a foot issue. Please advise if Dr.Cook would like to see her.  Pt contact [phone]

## 2016-11-18 NOTE — Telephone Encounter (Signed)
Spoke with patient and confirmed her stress test scheduled tomorrow and the need to go in early for pregnancy screening. She verbalized understanding of all instructions and has no further questions at this time.

## 2016-11-18 NOTE — Telephone Encounter (Signed)
She should be receiving the [place] letter , but from my understanding we have to treat patient for 31 days from date of letter but does not have to be scheduled with Dr. Lacinda Axon.

## 2016-11-18 NOTE — Telephone Encounter (Signed)
You are right let me get with Izora Gala.

## 2016-11-18 NOTE — Telephone Encounter (Signed)
Policy states that I have to provide urgent care. This is a chronic issue.

## 2016-11-18 NOTE — Telephone Encounter (Signed)
Patient returning call  Please call again

## 2016-11-19 ENCOUNTER — Other Ambulatory Visit
Admission: RE | Admit: 2016-11-19 | Discharge: 2016-11-19 | Disposition: A | Discharge status: Home / Self Care | Payer: BLUE CROSS/BLUE SHIELD | Source: Ambulatory Visit | Attending: Cardiology | Admitting: Cardiology

## 2016-11-19 ENCOUNTER — Encounter
Admission: RE | Admit: 2016-11-19 | Discharge: 2016-11-19 | Disposition: A | Discharge status: Home / Self Care | Payer: BLUE CROSS/BLUE SHIELD | Source: Ambulatory Visit | Attending: Cardiology | Admitting: Cardiology

## 2016-11-19 DIAGNOSIS — R55 Syncope and collapse: Secondary | ICD-10-CM

## 2016-11-19 LAB — NM MYOCAR MULTI W/SPECT W/WALL MOTION / EF
CHL CUP NUCLEAR SDS: 0
CHL CUP NUCLEAR SRS: 7
LVDIAVOL: 59 mL (ref 46–106)
LVSYSVOL: 27 mL
NUC STRESS TID: 0.78
Rest HR: 55 {beats}/min
SSS: 1

## 2016-11-19 LAB — PREGNANCY, URINE: PREG TEST UR: NEGATIVE

## 2016-11-19 MED ORDER — TECHNETIUM TC 99M TETROFOSMIN IV KIT
31.8080 | PACK | Freq: Once | INTRAVENOUS | Status: AC | PRN
  Administered 2016-11-19: 31.808 via INTRAVENOUS

## 2016-11-19 MED ORDER — TECHNETIUM TC 99M TETROFOSMIN IV KIT
13.2830 | PACK | Freq: Once | INTRAVENOUS | Status: AC | PRN
  Administered 2016-11-19: 13.283 via INTRAVENOUS

## 2016-11-22 ENCOUNTER — Ambulatory Visit: Payer: Self-pay | Admitting: Adult Health

## 2016-11-22 ENCOUNTER — Encounter: Payer: Self-pay | Admitting: Adult Health

## 2016-11-22 ENCOUNTER — Other Ambulatory Visit: Payer: Self-pay

## 2016-11-25 ENCOUNTER — Other Ambulatory Visit: Payer: Self-pay | Admitting: Nurse Practitioner

## 2016-12-01 NOTE — Telephone Encounter (Signed)
Per our previous conversation.  Yes.  Thank you

## 2016-12-02 ENCOUNTER — Other Ambulatory Visit: Payer: BLUE CROSS/BLUE SHIELD

## 2016-12-13 NOTE — Telephone Encounter (Signed)
Certified dismissal letter returned as undeliverable, unclaimed, return to sender after three attempts by [employer] on December 13, 2016. Letter placed in another envelope and resent as 1st class mail which does not require a signature. DAJ

## 2017-02-04 ENCOUNTER — Telehealth: Payer: Self-pay | Admitting: *Deleted

## 2017-02-04 NOTE — Telephone Encounter (Signed)
Please let pt know that we do not know of any providers.

## 2017-02-04 NOTE — Telephone Encounter (Signed)
Patient requested to know who would Dr. Lacinda Axon advised as a good MD at [place] contact [phone]

## 2017-03-01 ENCOUNTER — Telehealth: Payer: Self-pay | Admitting: Cardiology

## 2017-03-01 NOTE — Telephone Encounter (Signed)
Received records request Disability Determination Services, forwarded to [employer] for processing.  

## 2017-03-02 ENCOUNTER — Ambulatory Visit: Payer: Self-pay | Admitting: Adult Health

## 2017-03-02 ENCOUNTER — Other Ambulatory Visit: Payer: Self-pay

## 2017-03-02 NOTE — Progress Notes (Deleted)
ID: Penny Kim   DOB: 09-Mar-1971  MR#: [id number]  FKC#:[id number]  PCP:  Charolette Forward, NP GYN:  OB/GYN SU: Rolm Bookbinder, MD OTHER MD: Crissie Reese, MD; Ovidio Hanger; Chucky May, MD, Hulan Saas M.D.,  CHIEF COMPLAINT:  Hx of Right Breast Cancer  CURRENT TREATMENT: Tamoxifen  BREAST CANCER HISTORY: From the original intake note:  Penny Kim had noticed that her right breast dropped a little differently than the left, and occasionally she would have a strange sensation radiating to the right nipple, but she really did not pay much attention to this.  She had her first mammogram ever, a screening mammogram at [facility], 04/23/2010, and this showed diffuse calcifications in the lower inner quadrant of the right breast measuring up to 5.6 cm.  She was recalled 08/08, and Dr. March Rummage found the breast to be dense, which limits mammographic sensitivity, but again was able to demonstrate these calcifications throughout the lower inner quadrant of the right breast.  Biopsy was discussed, and performed 08/09, and this showed [id number] ductal carcinoma in situ, high grade, which was estrogen receptor 100% and progesterone receptor 57% positive.  Bilateral breast MRIs were obtained 08/14.  This showed in the right breast the area of enhancement to total 7.7 cm anterior posteriorly.  Most of this was nodular and non-mass like, but in addition in the anterior aspect of this, there was a rounded area of mass-like enhancement measuring 1.7 cm.  This portion was suspicious for invasive ductal carcinoma.  The left breast was unremarkable, and there were no suspicious internal mammary or axillary lymph nodes noted.  Accordingly the patient was brought back for biopsy of the more mass-like area on 05/05/2010, and this biopsy ([id number]) showed a grade 2 invasive ductal carcinoma, which was estrogen receptor at 78% and progesterone receptor 82% positive.  The proliferation marker was elevated at  85%.  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   Her subsequent history is as detailed below.  INTERVAL HISTORY:   REVIEW OF SYSTEMS:   PAST MEDICAL HISTORY: Past Medical History:  Diagnosis Date  . Allergy   . Anxiety   . Breast cancer, right breast (Penny Kim) 02/2010   s/p chemo (completed 09/2010) and right mastectomy w/reconstruction  . Cancer ([place])   . Depression   . HTN (hypertension)     PAST SURGICAL HISTORY: Past Surgical History:  Procedure Laterality Date  . BREAST RECONSTRUCTION  01/06/2012   Procedure: BREAST RECONSTRUCTION;  Surgeon: Crissie Reese, MD;  Location: [place];  Service: Plastics;  Laterality: Bilateral;  bilateral removal of tissue expanders, bilateral placement of implants--Breast  . BREAST SURGERY  06/19/2010   exploration of right mastectomy site due to post-op bleeding  . INSERTION OF TISSUE EXPANDER AFTER MASTECTOMY  06/19/2010   bilat. mastectomies with bilat. tissue expanders  . PORT-A-CATH REMOVAL  11/26/2010  . PORTACATH PLACEMENT  07/23/2010  . TISSUE EXPANDER REMOVAL  10/20/2010   left  Depression.  This has been present from the patient's early 20s.  She has never been hospitalized for this.  When she looks back on the antidepressants that she has used, she did best with Zoloft, but she tells me she had to discontinue that because of reflux issues.  She is followed by Dr. Kasandra Knudsen in [place] (clinic number [phone]), and he has recently changed her from Remeron to [place].  However, the Pristiq is $50 a month, and Penny Kim has not been able to afford it.  A  second problem is borderline glucose intolerance (there is a strong family history of diabetes).  FAMILY HISTORY Family History  Problem Relation Age of Onset  . Hypertension Mother   . Depression Mother   . Heart attack Mother   . Hyperlipidemia Mother   . Cancer Sister 30       Ovarian cancer  . Hypertension Maternal Grandmother   . Diabetes Maternal  Grandmother   . 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 Hypertension Maternal Grandfather   . Diabetes Paternal Grandmother   . Hypertension Paternal Grandmother   . Kidney disease Paternal Grandmother        End stage renal dz - dialysis  . Hyperlipidemia Paternal Grandmother   . Alcohol abuse Father   . Depression Father   . Drug abuse Father 72       Died of drug overdose  The patient's father died at the age of 93 from drug overdose in the setting of ETOH abuse.  The patient's mother is alive.  She has a significant history of depression.  The patient has one sister, also with a history of depression.  GYNECOLOGIC HISTORY:   (Updated 03/11/2014) She is GX, P0.  She stopped having periods with chemotherapy in approximately November 2011. These resumed following a fall in February 2015, and has been somewhat regular since that time. Last menstrual cycle was 02/11/2014. (Patient was reminded that tamoxifen is not a birth control method, and she does need to use barrier forms of birth control if and when necessary.)  SOCIAL HISTORY:  (Updated 04/11/2015) Penny Kim currently works with [employer].  When asked who she would call in case of a problem, she really does not have anyone that she would call.  She does not feel her family is supportive, and she has no close friends.     ADVANCED DIRECTIVES: Not in place  HEALTH MAINTENANCE:  (Updated 03/11/2014) Social History  Substance Use Topics  . Smoking status: Never Smoker  . Smokeless tobacco: Never Used  . Alcohol use 0.6 oz/week    1 Glasses of wine per week     Comment: occasionally     Colonoscopy: Never  PAP: March 2015  Bone density: Never  Lipid panel:  November 2014  Allergies  Allergen Reactions  . Prochlorperazine Other (See Comments)    SYNCOPE  . Methadone Hcl Itching  . Tape Rash    Current Outpatient Prescriptions  Medication Sig Dispense Refill  . amLODipine (NORVASC) 2.5 MG tablet  Take 1 tablet (2.5 mg total) by mouth daily. 30 tablet 6  . calamine lotion Apply 1 application topically as needed for itching.    . clonazePAM (KLONOPIN) 1 MG tablet Take 1 mg by mouth 2 (two) times daily. Pt takes  1-2 tablets daily    . fluticasone (FLONASE) 50 MCG/ACT nasal spray Place into both nostrils daily.    . hydrochlorothiazide (HYDRODIURIL) 25 MG tablet   5  . predniSONE (DELTASONE) 10 MG tablet Take 4 tablets ( total 40 mg) by mouth for 2 days; take 3 tablets ( total 30 mg) by mouth for 2 days; take 2 tablets ( total 20 mg) by mouth for 1 day; take 1 tablet ( total 10 mg) by mouth for 1 day. 17 tablet 0  . sertraline (ZOLOFT) 100 MG tablet Take 100 mg by mouth daily. Reported on 09/11/2015    . tamoxifen (NOLVADEX) 20 MG tablet TAKE 1  TABLET BY MOUTH EVERY DAY 150 tablet 0   No current facility-administered medications for this visit.     OBJECTIVE: There were no vitals filed for this visit.   There is no height or weight on file to calculate BMI.    ECOG FS: 0 There were no vitals filed for this visit. GENERAL: Patient is a well appearing female in no acute distress HEENT:  Sclerae anicteric.  Oropharynx clear and moist. No ulcerations or evidence of oropharyngeal candidiasis. Neck is supple.  NODES:  No cervical, supraclavicular, or axillary lymphadenopathy palpated.  BREAST EXAM:  Deferred. LUNGS:  Clear to auscultation bilaterally.  No wheezes or rhonchi. HEART:  Regular rate and rhythm. No murmur appreciated. ABDOMEN:  Soft, nontender.  Positive, normoactive bowel sounds. No organomegaly palpated. MSK:  No focal spinal tenderness to palpation. Full range of motion bilaterally in the upper extremities. EXTREMITIES:  No peripheral edema.   SKIN:  Clear with no obvious rashes or skin changes. No nail dyscrasia. NEURO:  Nonfocal. Well oriented.  Appropriate affect.     LAB RESULTS: Lab Results  Component Value Date   WBC 3.9 (L) 08/06/2016   NEUTROABS 3.2 10/13/2015    HGB 13.0 08/06/2016   HCT 38.3 08/06/2016   MCV 85.4 08/06/2016   PLT 199.0 08/06/2016      Chemistry      Component Value Date/Time   NA 141 08/06/2016 0846   NA 138 10/13/2015 1406   K 3.5 08/06/2016 0846   K 3.6 10/13/2015 1406   CL 103 08/06/2016 0846   CL 105 01/30/2013 1313   CO2 30 08/06/2016 0846   CO2 27 10/13/2015 1406   BUN 12 08/06/2016 0846   BUN 10.2 10/13/2015 1406   CREATININE 0.87 08/06/2016 0846   CREATININE 1.0 10/13/2015 1406      Component Value Date/Time   CALCIUM 9.1 08/06/2016 0846   CALCIUM 9.5 10/13/2015 1406   ALKPHOS 30 (L) 08/06/2016 0846   ALKPHOS 38 (L) 10/13/2015 1406   AST 15 08/06/2016 0846   AST 22 10/13/2015 1406   ALT 11 08/06/2016 0846   ALT 15 10/13/2015 1406   BILITOT 0.3 08/06/2016 0846   BILITOT <0.30 10/13/2015 1406      STUDIES: No results found.  ASSESSMENT: 46 y.o. 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    (3) She did not require radiation.   (4)  She started tamoxifen in March 2012.     PLAN:   Dachelle will return in 1 year for labs and a follow up visit. She understands and agrees with this plan. She knows the goal of treatment in her case is cure. She has been encouraged to call with any issues that might arise before    Scot Dock, NP   03/02/2017

## 2017-03-18 NOTE — Progress Notes (Deleted)
ID: Penny Kim   DOB: [birth year]  MR#: [id number]  PHX#:[id number]  PCP:  Charolette Forward, NP GYN: [facility] SU: Rolm Bookbinder, MD OTHER MD: Crissie Reese, MD; Ovidio Hanger; Chucky May, MD, Hulan Saas M.D.,  CHIEF COMPLAINT:  Hx of Right Breast Cancer  CURRENT TREATMENT: Tamoxifen  BREAST CANCER HISTORY: From the original intake note:  Davis had noticed that her right breast dropped a little differently than the left, and occasionally she would have a strange sensation radiating to the right nipple, but she really did not pay much attention to this.  She had her first mammogram ever, a screening mammogram at [facility], 04/23/2010, and this showed diffuse calcifications in the lower inner quadrant of the right breast measuring up to 5.6 cm.  She was recalled 08/08, and Dr. March Rummage found the breast to be dense, which limits mammographic sensitivity, but again was able to demonstrate these calcifications throughout the lower inner quadrant of the right breast.  Biopsy was discussed, and performed 08/09, and this showed [phone]) ductal carcinoma in situ, high grade, which was estrogen receptor 100% and progesterone receptor 57% positive.  Bilateral breast MRIs were obtained 08/14.  This showed in the right breast the area of enhancement to total 7.7 cm anterior posteriorly.  Most of this was nodular and non-mass like, but in addition in the anterior aspect of this, there was a rounded area of mass-like enhancement measuring 1.7 cm.  This portion was suspicious for invasive ductal carcinoma.  The left breast was unremarkable, and there were no suspicious internal mammary or axillary lymph nodes noted.  Accordingly the patient was brought back for biopsy of the more mass-like area on 05/05/2010, and this biopsy ([id number]) showed a grade 2 invasive ductal carcinoma, which was estrogen receptor at 78% and progesterone receptor 82% positive.  The proliferation marker was elevated at  85%.  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   Her subsequent history is as detailed below.  INTERVAL HISTORY:   REVIEW OF SYSTEMS:   PAST MEDICAL HISTORY: Past Medical History:  Diagnosis Date  . Allergy   . Anxiety   . Breast cancer, right breast ([place]) 02/2010   s/p chemo (completed 09/2010) and right mastectomy w/reconstruction  . Cancer ([place])   . Depression   . HTN (hypertension)     PAST SURGICAL HISTORY: Past Surgical History:  Procedure Laterality Date  . BREAST RECONSTRUCTION  01/06/2012   Procedure: BREAST RECONSTRUCTION;  Surgeon: Crissie Reese, MD;  Location: [facility];  Service: Plastics;  Laterality: Bilateral;  bilateral removal of tissue expanders, bilateral placement of implants--Breast  . BREAST SURGERY  06/19/2010   exploration of right mastectomy site due to post-op bleeding  . INSERTION OF TISSUE EXPANDER AFTER MASTECTOMY  06/19/2010   bilat. mastectomies with bilat. tissue expanders  . PORT-A-CATH REMOVAL  11/26/2010  . PORTACATH PLACEMENT  07/23/2010  . TISSUE EXPANDER REMOVAL  10/20/2010   left  Depression.  This has been present from the patient's early 46s.  She has never been hospitalized for this.  When she looks back on the antidepressants that she has used, she did best with Zoloft, but she tells me she had to discontinue that because of reflux issues.  She is followed by Dr. Kasandra Knudsen in [place] (clinic number [phone]), and he has recently changed her from Remeron to [place].  However, the Pristiq is $50 a month, and Ch[place] has not been able to afford it.  A  second problem is borderline glucose intolerance (there is a strong family history of diabetes).  FAMILY HISTORY Family History  Problem Relation Age of Onset  . Hypertension Mother   . Depression Mother   . Heart attack Mother   . Hyperlipidemia Mother   . Cancer Sister 30       Ovarian cancer  . Hypertension Maternal Grandmother   . Diabetes Maternal  Grandmother   . 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 Hypertension Maternal Grandfather   . Diabetes Paternal Grandmother   . Hypertension Paternal Grandmother   . Kidney disease Paternal Grandmother        End stage renal dz - dialysis  . Hyperlipidemia Paternal Grandmother   . Alcohol abuse Father   . Depression Father   . Drug abuse Father 1       Died of drug overdose  The patient's father died at the age of 46 from drug overdose in the setting of ETOH abuse.  The patient's mother is alive.  She has a significant history of depression.  The patient has one sister, also with a history of depression.  GYNECOLOGIC HISTORY:   (Updated 03/11/2014) She is GX, P0.  She stopped having periods with chemotherapy in approximately November 2011. These resumed following a fall in February 2015, and has been somewhat regular since that time. Last menstrual cycle was 02/11/2014. (Patient was reminded that tamoxifen is not a birth control method, and she does need to use barrier forms of birth control if and when necessary.)  SOCIAL HISTORY:  (Updated 04/11/2015) [name] currently works with [employer].  When asked who she would call in case of a problem, she really does not have anyone that she would call.  She does not feel her family is supportive, and she has no close friends.     ADVANCED DIRECTIVES: Not in place  HEALTH MAINTENANCE:  (Updated 03/11/2014) Social History  Substance Use Topics  . Smoking status: Never Smoker  . Smokeless tobacco: Never Used  . Alcohol use 0.6 oz/week    1 Glasses of wine per week     Comment: occasionally     Colonoscopy: Never  PAP: March 2015  Bone density: Never  Lipid panel:  November 2014  Allergies  Allergen Reactions  . Prochlorperazine Other (See Comments)    SYNCOPE  . Methadone Hcl Itching  . Tape Rash    Current Outpatient Prescriptions  Medication Sig Dispense Refill  . amLODipine (NORVASC) 2.5 MG tablet  Take 1 tablet (2.5 mg total) by mouth daily. 30 tablet 6  . calamine lotion Apply 1 application topically as needed for itching.    . clonazePAM (KLONOPIN) 1 MG tablet Take 1 mg by mouth 2 (two) times daily. Pt takes  1-2 tablets daily    . fluticasone (FLONASE) 50 MCG/ACT nasal spray Place into both nostrils daily.    . hydrochlorothiazide (HYDRODIURIL) 25 MG tablet   5  . predniSONE (DELTASONE) 10 MG tablet Take 4 tablets ( total 40 mg) by mouth for 2 days; take 3 tablets ( total 30 mg) by mouth for 2 days; take 2 tablets ( total 20 mg) by mouth for 1 day; take 1 tablet ( total 10 mg) by mouth for 1 day. 17 tablet 0  . sertraline (ZOLOFT) 100 MG tablet Take 100 mg by mouth daily. Reported on 09/11/2015    . tamoxifen (NOLVADEX) 20 MG tablet TAKE 1  TABLET BY MOUTH EVERY DAY 150 tablet 0   No current facility-administered medications for this visit.     OBJECTIVE:  There were no vitals filed for this visit.   There is no height or weight on file to calculate BMI.    ECOG FS: 0 There were no vitals filed for this visit. GENERAL: Patient is a well appearing female in no acute distress HEENT:  Sclerae anicteric.  Oropharynx clear and moist. No ulcerations or evidence of oropharyngeal candidiasis. Neck is supple.  NODES:  No cervical, supraclavicular, or axillary lymphadenopathy palpated.  BREAST EXAM:  Deferred. LUNGS:  Clear to auscultation bilaterally.  No wheezes or rhonchi. HEART:  Regular rate and rhythm. No murmur appreciated. ABDOMEN:  Soft, nontender.  Positive, normoactive bowel sounds. No organomegaly palpated. MSK:  No focal spinal tenderness to palpation. Full range of motion bilaterally in the upper extremities. EXTREMITIES:  No peripheral edema.   SKIN:  Clear with no obvious rashes or skin changes. No nail dyscrasia. NEURO:  Nonfocal. Well oriented.  Appropriate affect.      LAB RESULTS: Lab Results  Component Value Date   WBC 3.9 (L) 08/06/2016   NEUTROABS 3.2  10/13/2015   HGB 13.0 08/06/2016   HCT 38.3 08/06/2016   MCV 85.4 08/06/2016   PLT 199.0 08/06/2016      Chemistry      Component Value Date/Time   NA 141 08/06/2016 0846   NA 138 10/13/2015 1406   K 3.5 08/06/2016 0846   K 3.6 10/13/2015 1406   CL 103 08/06/2016 0846   CL 105 01/30/2013 1313   CO2 30 08/06/2016 0846   CO2 27 10/13/2015 1406   BUN 12 08/06/2016 0846   BUN 10.2 10/13/2015 1406   CREATININE 0.87 08/06/2016 0846   CREATININE 1.0 10/13/2015 1406      Component Value Date/Time   CALCIUM 9.1 08/06/2016 0846   CALCIUM 9.5 10/13/2015 1406   ALKPHOS 30 (L) 08/06/2016 0846   ALKPHOS 38 (L) 10/13/2015 1406   AST 15 08/06/2016 0846   AST 22 10/13/2015 1406   ALT 11 08/06/2016 0846   ALT 15 10/13/2015 1406   BILITOT 0.3 08/06/2016 0846   BILITOT <0.30 10/13/2015 1406      STUDIES: No results found.  ASSESSMENT: 46 y.o. 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    (3) She did not require radiation.   (4)  She started tamoxifen in March 2012.     PLAN:     Scot Dock, NP   03/18/2017

## 2017-03-21 ENCOUNTER — Ambulatory Visit: Payer: Self-pay | Admitting: Adult Health

## 2017-03-21 ENCOUNTER — Other Ambulatory Visit: Payer: Self-pay

## 2017-03-31 ENCOUNTER — Other Ambulatory Visit: Payer: Self-pay

## 2017-03-31 ENCOUNTER — Ambulatory Visit: Payer: Self-pay | Admitting: Adult Health

## 2017-04-19 ENCOUNTER — Encounter: Payer: Self-pay | Admitting: Family

## 2017-04-19 ENCOUNTER — Other Ambulatory Visit: Payer: Self-pay | Admitting: Nurse Practitioner

## 2017-05-08 NOTE — Progress Notes (Deleted)
CLINIC:  Survivorship   REASON FOR VISIT:  Routine follow-up for history of breast cancer.   BRIEF ONCOLOGIC HISTORY:   No history exists.     INTERVAL HISTORY:  Penny Kim presents to the Survivorship Clinic today for routine follow-up for her history of breast cancer.  Overall, she reports feeling quite well. ***    REVIEW OF SYSTEMS:  Review of Systems - Oncology Breast: Denies any new nodularity, masses, tenderness, nipple changes, or nipple discharge.       PAST MEDICAL/SURGICAL HISTORY:  Past Medical History:  Diagnosis Date  . Allergy   . Anxiety   . Breast cancer, right breast ([place]) 02/2010   s/p chemo (completed 09/2010) and right mastectomy w/reconstruction  . Cancer ([place])   . Depression   . HTN (hypertension)    Past Surgical History:  Procedure Laterality Date  . BREAST RECONSTRUCTION  01/06/2012   Procedure: BREAST RECONSTRUCTION;  Surgeon: Penny Reese, MD;  Location: [facility];  Service: Plastics;  Laterality: Bilateral;  bilateral removal of tissue expanders, bilateral placement of implants--Breast  . BREAST SURGERY  06/19/2010   exploration of right mastectomy site due to post-op bleeding  . INSERTION OF TISSUE EXPANDER AFTER MASTECTOMY  06/19/2010   bilat. mastectomies with bilat. tissue expanders  . PORT-A-CATH REMOVAL  11/26/2010  . PORTACATH PLACEMENT  07/23/2010  . TISSUE EXPANDER REMOVAL  10/20/2010   left     ALLERGIES:  Allergies  Allergen Reactions  . Prochlorperazine Other (See Comments)    SYNCOPE  . Methadone Hcl Itching  . Tape Rash     CURRENT MEDICATIONS:  Outpatient Encounter Prescriptions as of 05/09/2017  Medication Sig  . amLODipine (NORVASC) 2.5 MG tablet Take 1 tablet (2.5 mg total) by mouth daily.  . calamine lotion Apply 1 application topically as needed for itching.  . clonazePAM (KLONOPIN) 1 MG tablet Take 1 mg by mouth 2 (two) times daily. Pt takes  1-2 tablets daily  . fluticasone (FLONASE) 50  MCG/ACT nasal spray Place into both nostrils daily.  . hydrochlorothiazide (HYDRODIURIL) 25 MG tablet   . predniSONE (DELTASONE) 10 MG tablet Take 4 tablets ( total 40 mg) by mouth for 2 days; take 3 tablets ( total 30 mg) by mouth for 2 days; take 2 tablets ( total 20 mg) by mouth for 1 day; take 1 tablet ( total 10 mg) by mouth for 1 day.  . sertraline (ZOLOFT) 100 MG tablet Take 100 mg by mouth daily. Reported on 09/11/2015  . tamoxifen (NOLVADEX) 20 MG tablet TAKE 1 TABLET BY MOUTH EVERY DAY   No facility-administered encounter medications on file as of 05/09/2017.      ONCOLOGIC FAMILY HISTORY:  Family History  Problem Relation Age of Onset  . Hypertension Mother   . Depression Mother   . Heart attack Mother   . Hyperlipidemia Mother   . Cancer Sister 30       Ovarian cancer  . Hypertension Maternal Grandmother   . Diabetes Maternal Grandmother   . 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 Hypertension Maternal Grandfather   . Diabetes Paternal Grandmother   . Hypertension Paternal Grandmother   . Kidney disease Paternal Grandmother        End stage renal dz - dialysis  . Hyperlipidemia Paternal Grandmother   . Alcohol abuse Father   . Depression Father   . Drug abuse Father 77  Died of drug overdose    GENETIC COUNSELING/TESTING: ***  SOCIAL HISTORY:  Penny Kim is /single/married/divorced/widowed/separated and lives alone/with her spouse/family/friend in (city), [place].  She has (#) children and they live in (city).  [name] is currently retired/disabled/working part-time/full-time as ***.  She denies any current or history of tobacco, alcohol, or illicit drug use.     PHYSICAL EXAMINATION:  Vital Signs: There were no vitals filed for this visit. There were no vitals filed for this visit. General: Well-nourished, well-appearing female in no acute distress.  Unaccompanied/Accompanied by***** today.   HEENT: Head is  normocephalic.  Pupils equal and reactive to light. Conjunctivae clear without exudate.  Sclerae anicteric. Oral mucosa is pink, moist.  Oropharynx is pink without lesions or erythema.  Lymph: No cervical, supraclavicular, or infraclavicular lymphadenopathy noted on palpation.  Cardiovascular: Regular rate and rhythm.Marland Kitchen Respiratory: Clear to auscultation bilaterally. Chest expansion symmetric; breathing non-labored.  Breast Exam:  -Left breast: No appreciable masses on palpation. No skin redness, thickening, or peau d'orange appearance; no nipple retraction or nipple discharge; mild distortion in symmetry at previous lumpectomy site***healed scar without erythema or nodularity.  -Right breast: No appreciable masses on palpation. No skin redness, thickening, or peau d'orange appearance; no nipple retraction or nipple discharge; mild distortion in symmetry at previous lumpectomy site***healed scar without erythema or nodularity. -Axilla: No axillary adenopathy bilaterally.  GI: Abdomen soft and round; non-tender, non-distended. Bowel sounds normoactive. No hepatosplenomegaly.   GU: Deferred.  Neuro: No focal deficits. Steady gait.  Psych: Mood and affect normal and appropriate for situation.  MSK: No focal spinal tenderness to palpation, full range of motion in bilateral upper extremities Extremities: No edema. Skin: Warm and dry.  LABORATORY DATA:  None for this visit***   DIAGNOSTIC IMAGING:  Most recent mammogram: ***    ASSESSMENT AND PLAN:  Ms.. Kruk is a pleasant 46 y.o. female with history of Stage *** right/left breast invasive ductal carcinoma, ER+/PR+/HER2-, diagnosed in (date), treated with lumpectomy, adjuvant radiation therapy, and anti-estrogen therapy with *** beginning in (date).  She presents to the Survivorship Clinic for surveillance and routine follow-up.   1. History of breast cancer:  [name] is currently clinically and radiographically without evidence of disease or  recurrence of breast cancer. She will be due for mammogram in ***; orders placed today.  She will continue her anti-estrogen therapy with ***, with plans to continue for *** years.  She will return to the cancer center to see her medical oncologist, Dr. ***, in ***/2018.  I encouraged her to call me with any questions or concerns before her next visit at the cancer center, and I would be happy to see her sooner, if needed.    #. Problem(s) at Visit___________________.  #. Bone health:  Given Ms. Maricle's age, history of breast cancer, and her current anti-estrogen therapy with ________, she is at risk for bone demineralization. Her last DEXA scan was on **/**/20**.  In the meantime, she was encouraged to increase her consumption of foods rich in calcium, as well as increase her weight-bearing activities.  She was given education on specific food and activities to promote bone health.  #. Cancer screening:  Due to Ms. Depaulo's history and her age, she should receive screening for skin cancers, colon cancer, and ***gynecologic cancers. She was encouraged to follow-up with her PCP for appropriate cancer screenings.   #. Health maintenance and wellness promotion: Ms. Totten was encouraged to consume 5-7 servings of fruits and  vegetables per day. She was also encouraged to engage in moderate to vigorous exercise for 30 minutes per day most days of the week. She was instructed to limit her alcohol consumption and continue to abstain from tobacco use/was encouraged stop smoking.  ***    Dispo:  -Return to cancer center ***   A total of (30) minutes of face-to-face time was spent with this patient with greater than 50% of that time in counseling and care-coordination.   Gardenia Phlegm, NP [facility] [place] [phone]   Note: PRIMARY CARE PROVIDER No primary care provider on file. None None

## 2017-05-09 ENCOUNTER — Encounter: Payer: Self-pay | Admitting: Adult Health

## 2017-05-09 ENCOUNTER — Ambulatory Visit
Admission: RE | Admit: 2017-05-09 | Discharge: 2017-05-09 | Disposition: A | Discharge status: Home / Self Care | Payer: Disability Insurance | Source: Ambulatory Visit | Attending: Family Medicine | Admitting: Family Medicine

## 2017-05-09 ENCOUNTER — Other Ambulatory Visit: Payer: Self-pay | Admitting: Family Medicine

## 2017-05-09 ENCOUNTER — Ambulatory Visit: Payer: Self-pay | Admitting: Adult Health

## 2017-05-09 ENCOUNTER — Other Ambulatory Visit: Payer: Self-pay

## 2017-05-09 DIAGNOSIS — M87052 Idiopathic aseptic necrosis of left femur: Secondary | ICD-10-CM | POA: Insufficient documentation

## 2017-05-09 DIAGNOSIS — M87051 Idiopathic aseptic necrosis of right femur: Secondary | ICD-10-CM | POA: Diagnosis present

## 2017-05-09 DIAGNOSIS — M160 Bilateral primary osteoarthritis of hip: Secondary | ICD-10-CM | POA: Insufficient documentation

## 2017-05-11 ENCOUNTER — Other Ambulatory Visit: Payer: Self-pay | Admitting: *Deleted

## 2017-05-11 MED ORDER — TAMOXIFEN CITRATE 20 MG PO TABS: 20.0000 mg | ORAL_TABLET | Freq: Every day | ORAL | 1 refills | Status: DC

## 2017-05-21 ENCOUNTER — Encounter: Payer: Self-pay | Admitting: Family Medicine

## 2017-07-14 ENCOUNTER — Other Ambulatory Visit: Payer: Self-pay

## 2017-07-14 MED ORDER — AMLODIPINE BESYLATE 2.5 MG PO TABS: 2.5000 mg | ORAL_TABLET | Freq: Every day | ORAL | 0 refills | Status: DC

## 2017-07-14 NOTE — Telephone Encounter (Signed)
Refill sent for amlodipine 2.5 mg.

## 2017-08-05 ENCOUNTER — Ambulatory Visit: Payer: Self-pay | Admitting: Cardiovascular Disease

## 2017-08-05 NOTE — Progress Notes (Deleted)
Cardiology Office Note  Date:  08/05/2017   ID:  Penny Kim, DOB 11/21/1970, MRN [id number]  PCP:  Patient, No Pcp Per   No chief complaint on file.   HPI:  Penny Kim is a 46 y.o. female who has past medical history of Syncope    brushing her teeth in her bathroom, early in the morning upon waking up.  She then noted vision turning dark and the next thing she knew, her face hit the wall between them never in the bathroom sink. No prior episode of chest pain or shortness of breath or palpitations right before the episode.  Apparently, this has been going on for quite some time now. She can remember as far back as 2012. She would have multiple episodes of feeling lightheaded   Usually happens on upright position.  She also reports that in the early morning, when she has to go to the bathroom, she would feel somewhat biased from sitting up and standing from a lying position.  She does have some chest pains or she feels this is related to her being depressed from her mother's passing at age 5 a year ago.  She also reports shortness breath with exertion. Mild in intensity, remaining in the chest, nonradiating, a few minutes, resolve with rest.  Amlodipine dose decreased  Echocardiogram February 2018 normal ejection fraction, sinus bradycardia noted   Stress test March 2018 low risk scan, no ischemia, ejection fraction greater than 55%    PMH:   has a past medical history of Allergy, Anxiety, Breast cancer, right breast ([place]) (02/2010), Cancer ([place]), Depression, and HTN (hypertension).  PSH:    Past Surgical History:  Procedure Laterality Date  . BREAST RECONSTRUCTION Bilateral 01/06/2012   Performed by Crissie Reese, MD at [facility]  . BREAST SURGERY  06/19/2010   exploration of right mastectomy site due to post-op bleeding  . INSERTION OF TISSUE EXPANDER AFTER MASTECTOMY  06/19/2010   bilat. mastectomies with bilat. tissue expanders  . PORT-A-CATH  REMOVAL  11/26/2010  . PORTACATH PLACEMENT  07/23/2010  . TISSUE EXPANDER REMOVAL  10/20/2010   left    Current Outpatient Medications  Medication Sig Dispense Refill  . amLODipine (NORVASC) 2.5 MG tablet Take 1 tablet (2.5 mg total) by mouth daily. 30 tablet 0  . calamine lotion Apply 1 application topically as needed for itching.    . clonazePAM (KLONOPIN) 1 MG tablet Take 1 mg by mouth 2 (two) times daily. Pt takes  1-2 tablets daily    . fluticasone (FLONASE) 50 MCG/ACT nasal spray Place into both nostrils daily.    . hydrochlorothiazide (HYDRODIURIL) 25 MG tablet   5  . predniSONE (DELTASONE) 10 MG tablet Take 4 tablets ( total 40 mg) by mouth for 2 days; take 3 tablets ( total 30 mg) by mouth for 2 days; take 2 tablets ( total 20 mg) by mouth for 1 day; take 1 tablet ( total 10 mg) by mouth for 1 day. 17 tablet 0  . sertraline (ZOLOFT) 100 MG tablet Take 100 mg by mouth daily. Reported on 09/11/2015    . tamoxifen (NOLVADEX) 20 MG tablet Take 1 tablet (20 mg total) by mouth daily. 30 tablet 1   No current facility-administered medications for this visit.      Allergies:   Prochlorperazine; Methadone hcl; and Tape   Social History:  The patient  reports that  has never smoked. she has never used smokeless tobacco. She reports that  she drinks about 0.6 oz of alcohol per week. She reports that she does not use drugs.   Family History:   family history includes Alcohol abuse in her father; Cancer in her maternal grandfather; Cancer (age of onset: 53) in her sister; Depression in her father and mother; Diabetes in her maternal grandmother and paternal grandmother; Drug abuse (age of onset: 36) in her father; Heart attack in her mother; Hyperlipidemia in her mother and paternal grandmother; Hypertension in her maternal grandfather, maternal grandmother, mother, and paternal grandmother; Kidney disease in her paternal grandmother.    Review of Systems: ROS   PHYSICAL EXAM: VS:  There were  no vitals taken for this visit. , BMI There is no height or weight on file to calculate BMI. GEN: Well nourished, well developed, in no acute distress HEENT: normal Neck: no JVD, carotid bruits, or masses Cardiac: RRR; no murmurs, rubs, or gallops,no edema  Respiratory:  clear to auscultation bilaterally, normal work of breathing GI: soft, nontender, nondistended, + BS MS: no deformity or atrophy Skin: warm and dry, no rash Neuro:  Strength and sensation are intact Psych: euthymic mood, full affect    Recent Labs: 08/06/2016: ALT 11; BUN 12; Creatinine, Ser 0.87; Hemoglobin 13.0; Platelets 199.0; Potassium 3.5; Sodium 141; TSH 1.90    Lipid Panel Lab Results  Component Value Date   CHOL 250 (H) 08/06/2016   HDL 61.60 08/06/2016   LDLCALC 154 (H) 08/06/2016   TRIG 172.0 (H) 08/06/2016      Wt Readings from Last 3 Encounters:  10/19/16 154 lb (69.9 kg)  10/11/16 150 lb (68 kg)  08/18/16 151 lb (68.5 kg)       ASSESSMENT AND PLAN:  No diagnosis found.   Disposition:   F/U  6 months  No orders of the defined types were placed in this encounter.    Signed, Penny Kim, M.D., Ph.D. 08/05/2017  [place], [place]

## 2017-08-30 ENCOUNTER — Ambulatory Visit: Payer: Self-pay | Admitting: Cardiovascular Disease

## 2017-08-31 ENCOUNTER — Encounter: Payer: Self-pay | Admitting: Physician Assistant

## 2017-08-31 NOTE — Progress Notes (Signed)
Cardiology Office Note Date:  09/01/2017  Patient ID:  Penny Kim, Penny Kim November 06, 1970, MRN [id number] PCP:  Trinna Post, PA-C  Cardiologist:  Former Dr. Yvone Neu patient    Chief Complaint: Medication refill  History of Present Illness: Penny Kim is a 46 y.o. female with history of orthostatic hypotension with syncope, HTN, right-side breast cancer s/p bilateral mastectomies with reconstruction and chemotherapy (doxorubicin/cyclophosphamide, paclitaxel)in 09/2010, anxiety, and depression who presents for medication refill.   Previously seen by Dr. Yvone Neu x 1 on 08/18/2016 for evaluation of syncope in 05/2016 that dates back to 2012. Episodes described as lightheadedness associated with positional changes and following using the restroom. She was noted to be orthostatic in the office on 08/18/2016. At that time, her HCTZ was stopped and her amlodipine was decreased to 2.5 mg daily.  Echocardiogram was ordered, which was not completed until 10/2016 that showed EF 50-55%, no RWMA, normal LV diastolic function, left atrium normal, RV systolic function normal, PASP normal. Nuclear stress test was also ordered and not completed until 11/19/2016 that showed a small defect of mild severity present in the apex location felt to be secondary to breast attenuation. EF 55-65%. Normal study.   She comes in today noting she feels, "terrible." She reports she is under increased stress at work and is having a "financial crisis." Her blood pressure typically runs in the 132G systolic at home. She reports compliance with Norvasc 2.5 mg daily. She has not had any further episodes of syncope/orthostasis. She reports her blood pressure was previously well controlled on HCTZ, which was discontinued in 07/2016 as above due to orthostasis. She is also dealing with a URI and taking OTC nasal decongestant phenylephrine. She reports feeling very anxious since coming off Klonopin and has an appointment with a new PCP to  discuss resuming this medication. When she is under increased stress she will occasionally develop nonexertional chest pain, "that feels like a panic attack." Currently without chest pain. Not fasting currently.     Past Medical History:  Diagnosis Date  . Allergy   . Anxiety   . Breast cancer, right breast ([place]) 02/2010   s/p chemo (completed 09/2010) and right mastectomy w/reconstruction  . Depression   . History of stress test    a. treadmill Myoview 11/2016: small defect of mild severity present in the apex location felt to be secondary to breast attenuation. EF 55-65%. Normal study  . HTN (hypertension)   . Orthostatic hypotension   . Systolic dysfunction    a. TTE 10/2016: EF 50-55%, no RWMA, normal LV diastolic function, left atrium normal, RV systolic function normal, PASP normal    Past Surgical History:  Procedure Laterality Date  . BREAST RECONSTRUCTION  01/06/2012   Procedure: BREAST RECONSTRUCTION;  Surgeon: Crissie Reese, MD;  Location: [facility];  Service: Plastics;  Laterality: Bilateral;  bilateral removal of tissue expanders, bilateral placement of implants--Breast  . BREAST SURGERY  06/19/2010   exploration of right mastectomy site due to post-op bleeding  . INSERTION OF TISSUE EXPANDER AFTER MASTECTOMY  06/19/2010   bilat. mastectomies with bilat. tissue expanders  . PORT-A-CATH REMOVAL  11/26/2010  . PORTACATH PLACEMENT  07/23/2010  . TISSUE EXPANDER REMOVAL  10/20/2010   left    Current Meds  Medication Sig  . amLODipine (NORVASC) 2.5 MG tablet Take 1 tablet (2.5 mg total) by mouth daily.  . fluticasone (FLONASE) 50 MCG/ACT nasal spray Place into both nostrils daily.  . phenylephrine (NASAL DECONGESTANT)  10 MG TABS tablet Take 10 mg by mouth every 4 (four) hours as needed.  . sertraline (ZOLOFT) 100 MG tablet Take 100 mg by mouth daily. Reported on 09/11/2015  . tamoxifen (NOLVADEX) 20 MG tablet Take 1 tablet (20 mg total) by mouth daily.     Allergies:   Prochlorperazine; Methadone hcl; Other; and Tape   Social History:  The patient  reports that  has never smoked. she has never used smokeless tobacco. She reports that she drinks about 0.6 oz of alcohol per week. She reports that she does not use drugs.   Family History:  The patient's family history includes Alcohol abuse in her father; Cancer in her maternal grandfather; Cancer (age of onset: 76) in her sister; Depression in her father and mother; Diabetes in her maternal grandmother and paternal grandmother; Drug abuse (age of onset: 23) in her father; Heart attack in her mother; Hyperlipidemia in her mother and paternal grandmother; Hypertension in her maternal grandfather, maternal grandmother, mother, and paternal grandmother; Kidney disease in her paternal grandmother.  ROS:   Review of Systems  Constitutional: Positive for malaise/fatigue. Negative for chills, diaphoresis, fever and weight loss.  HENT: Positive for congestion, sinus pain and sore throat.   Eyes: Negative for discharge and redness.  Respiratory: Negative for cough, hemoptysis, sputum production, shortness of breath and wheezing.   Cardiovascular: Positive for chest pain. Negative for palpitations, orthopnea, claudication, leg swelling and PND.  Gastrointestinal: Negative for abdominal pain, blood in stool, heartburn, melena, nausea and vomiting.  Genitourinary: Negative for hematuria.  Musculoskeletal: Negative for falls and myalgias.  Skin: Negative for rash.  Neurological: Positive for weakness and headaches. Negative for dizziness, tingling, tremors, sensory change, speech change, focal weakness and loss of consciousness.  Endo/Heme/Allergies: Does not bruise/bleed easily.  Psychiatric/Behavioral: Negative for substance abuse. The patient is nervous/anxious.   All other systems reviewed and are negative.    PHYSICAL EXAM:  VS:  BP (!) 170/98 (BP Location: Left Arm, Patient Position: Sitting, Cuff  Size: Normal)   Pulse 100   Ht 5' 4.5" (1.638 m)   Wt 148 lb 8 oz (67.4 kg)   BMI 25.10 kg/m  BMI: Body mass index is 25.1 kg/m.  Physical Exam  Constitutional: She is oriented to person, place, and time. She appears well-developed and well-nourished.  HENT:  Head: Normocephalic and atraumatic.  Eyes: Right eye exhibits no discharge. Left eye exhibits no discharge.  Neck: Normal range of motion. No JVD present.  Cardiovascular: Normal rate, regular rhythm, S1 normal, S2 normal and normal heart sounds. Exam reveals no distant heart sounds, no friction rub, no midsystolic click and no opening snap.  No murmur heard. Pulses:      Posterior tibial pulses are 2+ on the right side, and 2+ on the left side.  Pulmonary/Chest: Effort normal and breath sounds normal. No respiratory distress. She has no decreased breath sounds. She has no wheezes. She has no rales. She exhibits no tenderness.  Abdominal: Soft. She exhibits no distension. There is no tenderness.  Musculoskeletal: She exhibits no edema.  Neurological: She is alert and oriented to person, place, and time.  Skin: Skin is warm and dry. No cyanosis. Nails show no clubbing.  Psychiatric: She has a normal mood and affect. Her speech is normal and behavior is normal. Judgment and thought content normal.     EKG:  Was ordered and interpreted by me today. Shows NSR, 100 bpm, no acute st/t changes   Recent Labs: No  results found for requested labs within last 8760 hours.  No results found for requested labs within last 8760 hours.   CrCl cannot be calculated (Patient's most recent lab result is older than the maximum 21 days allowed.).   Wt Readings from Last 3 Encounters:  09/01/17 148 lb 8 oz (67.4 kg)  10/19/16 154 lb (69.9 kg)  10/11/16 150 lb (68 kg)     Other studies reviewed: Additional studies/records reviewed today include: summarized above  ASSESSMENT AND PLAN:  1. Orthostatic hypotension/syncope: No further  episodes. Has been greater than 6 months since last episode. Will need to be cautious with titration of antihypertensives that is needed for her accelerated BP given her history of orthostasis. Adequate hydration advised.   2. HTN: Blood pressure is poorly controlled today with readings in the 193X mmHg systolic. She is under increased stress at work and home. She has experienced a lot of financial hardship lately. Increase Norvasc to 5 mg daily and restart HCTZ, though at 12.5 mg daily at this time. RN BP visit in 1 week. Check bmet, tsh, magnesium, and cbc. Suspect her hypertension is predominantly stress driven. If her readings do not improve with improved stress management/management of her anxiety could consider at that time to evaluate for alternative etiologies of hypertension to include labs for renin/aldosterone ratio, cortisol, catecholamines/metanephrines and imaging for RAS. If her BP is found to be driven by her anxiety, suspect this could be entirely managed by PCP.   3. Anxiety: She is very anxious appearing and asks about numerous non-cardiac somatic issues. I suspect this is playing a significant role her her blood pressure and overall subjective sensation of "not feeling well." Recommend she follow up with her PCP.   Disposition: F/u with Dr. Saunders Revel in 6 months or prn.   Current medicines are reviewed at length with the patient today.  The patient did not have any concerns regarding medicines.  Melvern Banker PA-C 09/01/2017 11:47 AM     [facility] [address] [place] [address] [place], [place] [address] [phone]

## 2017-09-01 ENCOUNTER — Encounter: Payer: Self-pay | Admitting: Physician Assistant

## 2017-09-01 ENCOUNTER — Ambulatory Visit (INDEPENDENT_AMBULATORY_CARE_PROVIDER_SITE_OTHER): Payer: Disability Insurance | Admitting: Physician Assistant

## 2017-09-01 VITALS — BP 170/98 | HR 100 | Ht 64.5 in | Wt 148.5 lb

## 2017-09-01 DIAGNOSIS — I951 Orthostatic hypotension: Secondary | ICD-10-CM

## 2017-09-01 DIAGNOSIS — I10 Essential (primary) hypertension: Secondary | ICD-10-CM

## 2017-09-01 DIAGNOSIS — F419 Anxiety disorder, unspecified: Secondary | ICD-10-CM

## 2017-09-01 MED ORDER — HYDROCHLOROTHIAZIDE 12.5 MG PO CAPS: 12.5000 mg | ORAL_CAPSULE | Freq: Every day | ORAL | 6 refills | Status: DC

## 2017-09-01 MED ORDER — AMLODIPINE BESYLATE 2.5 MG PO TABS: 5.0000 mg | ORAL_TABLET | Freq: Every day | ORAL | 6 refills | Status: DC

## 2017-09-01 NOTE — Patient Instructions (Signed)
Medication Instructions:  Please INCREASE your amlodipine to 5 mg once daily Please RESUME HCTZ 12.5 mg once daily  Labwork: CBC, BMET, Magnesium, TSH  Testing/Procedures: Nurse visit in 1 week for BP check  Follow-Up: Your physician wants you to follow-up in: 4 months with Dr. Saunders Revel  You will receive a reminder letter in the mail two months in advance.  If you don't receive a letter, please call our office to schedule the follow-up appointment.   If you need a refill on your cardiac medications before your next appointment, please call your pharmacy.

## 2017-09-02 LAB — CBC WITH DIFFERENTIAL/PLATELET
Basophils Absolute: 0 10*3/uL (ref 0.0–0.2)
Basos: 1 %
EOS (ABSOLUTE): 0.2 10*3/uL (ref 0.0–0.4)
EOS: 3 %
HEMATOCRIT: 36.3 % (ref 34.0–46.6)
Hemoglobin: 12.5 g/dL (ref 11.1–15.9)
IMMATURE GRANS (ABS): 0 10*3/uL (ref 0.0–0.1)
IMMATURE GRANULOCYTES: 0 %
LYMPHS: 21 %
Lymphocytes Absolute: 1.4 10*3/uL (ref 0.7–3.1)
MCH: 28.7 pg (ref 26.6–33.0)
MCHC: 34.4 g/dL (ref 31.5–35.7)
MCV: 83 fL (ref 79–97)
MONOCYTES: 9 %
MONOS ABS: 0.6 10*3/uL (ref 0.1–0.9)
NEUTROS PCT: 66 %
Neutrophils Absolute: 4.6 10*3/uL (ref 1.4–7.0)
Platelets: 273 10*3/uL (ref 150–379)
RBC: 4.36 x10E6/uL (ref 3.77–5.28)
RDW: 14.2 % (ref 12.3–15.4)
WBC: 6.8 10*3/uL (ref 3.4–10.8)

## 2017-09-02 LAB — BASIC METABOLIC PANEL
BUN/Creatinine Ratio: 8 — ABNORMAL LOW (ref 9–23)
BUN: 6 mg/dL (ref 6–24)
CO2: 25 mmol/L (ref 20–29)
CREATININE: 0.8 mg/dL (ref 0.57–1.00)
Calcium: 9.8 mg/dL (ref 8.7–10.2)
Chloride: 102 mmol/L (ref 96–106)
GFR calc Af Amer: 102 mL/min/{1.73_m2} (ref >59)
GFR calc non Af Amer: 89 mL/min/{1.73_m2} (ref >59)
GLUCOSE: 104 mg/dL — AB (ref 65–99)
Potassium: 4.5 mmol/L (ref 3.5–5.2)
Sodium: 139 mmol/L (ref 134–144)

## 2017-09-02 LAB — MAGNESIUM: MAGNESIUM: 1.8 mg/dL (ref 1.6–2.3)

## 2017-09-02 LAB — TSH: TSH: 2.78 u[IU]/mL (ref 0.450–4.500)

## 2017-09-05 ENCOUNTER — Other Ambulatory Visit: Payer: Self-pay

## 2017-09-05 DIAGNOSIS — E7800 Pure hypercholesterolemia, unspecified: Secondary | ICD-10-CM

## 2017-09-08 ENCOUNTER — Ambulatory Visit: Payer: Self-pay

## 2017-09-08 LAB — SPECIMEN STATUS REPORT

## 2017-09-08 LAB — LIPID PANEL W/O CHOL/HDL RATIO
Cholesterol, Total: 203 mg/dL — ABNORMAL HIGH (ref 100–199)
HDL: 73 mg/dL (ref >39)
LDL Calculated: 112 mg/dL — ABNORMAL HIGH (ref 0–99)
TRIGLYCERIDES: 90 mg/dL (ref 0–149)
VLDL Cholesterol Cal: 18 mg/dL (ref 5–40)

## 2017-09-08 LAB — LDL CHOLESTEROL, DIRECT: LDL Direct: 124 mg/dL — ABNORMAL HIGH (ref 0–99)

## 2017-09-09 ENCOUNTER — Encounter: Payer: Self-pay | Admitting: Physician Assistant

## 2017-09-22 ENCOUNTER — Ambulatory Visit: Payer: BLUE CROSS/BLUE SHIELD | Admitting: Physician Assistant

## 2017-09-22 ENCOUNTER — Encounter: Payer: Self-pay | Admitting: Physician Assistant

## 2017-09-22 VITALS — BP 132/78 | HR 72 | Temp 98.1°F | Resp 16 | Ht 65.0 in | Wt 147.0 lb

## 2017-09-22 DIAGNOSIS — M79672 Pain in left foot: Secondary | ICD-10-CM | POA: Diagnosis not present

## 2017-09-22 DIAGNOSIS — F419 Anxiety disorder, unspecified: Secondary | ICD-10-CM | POA: Diagnosis not present

## 2017-09-22 DIAGNOSIS — M87052 Idiopathic aseptic necrosis of left femur: Secondary | ICD-10-CM

## 2017-09-22 DIAGNOSIS — C50211 Malignant neoplasm of upper-inner quadrant of right female breast: Secondary | ICD-10-CM | POA: Diagnosis not present

## 2017-09-22 DIAGNOSIS — R7303 Prediabetes: Secondary | ICD-10-CM | POA: Diagnosis not present

## 2017-09-22 DIAGNOSIS — F329 Major depressive disorder, single episode, unspecified: Secondary | ICD-10-CM

## 2017-09-22 DIAGNOSIS — I10 Essential (primary) hypertension: Secondary | ICD-10-CM | POA: Diagnosis not present

## 2017-09-22 DIAGNOSIS — Z170 Estrogen receptor positive status [ER+]: Secondary | ICD-10-CM

## 2017-09-22 DIAGNOSIS — M87051 Idiopathic aseptic necrosis of right femur: Secondary | ICD-10-CM

## 2017-09-22 DIAGNOSIS — Z23 Encounter for immunization: Secondary | ICD-10-CM

## 2017-09-22 DIAGNOSIS — F32A Depression, unspecified: Secondary | ICD-10-CM

## 2017-09-22 NOTE — Progress Notes (Signed)
Patient: Penny Kim Female    DOB: [birth year]   47 y.o.   MRN: [id number] Visit Date: 09/22/2017  Today's Provider: Trinna Post, PA-C   Chief Complaint  Patient presents with  . Establish Kim  . Depression   Subjective:    Penny Kim is a 47 y/o woman who is presenting today to establish Kim. She was previously seen at [facility]. She missed multiple appointments and was dismissed from the practice.   She is single, never married. She lives alone in [place]. 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 She underwent bilateral mastectomies and was also treated with adjuvant chemotherapy. No radiation. She started Tamoxifen in march 2012 and had been on it until one year ago. She says she lost her job and insurance and was unable to attend her appointments. She has not been to see oncology in one year but has an upcoming appointment with Dr. Jana Kim on 09/26/2017.  She has a a history of HTN for which she is taking 5 mg amlodipine daily and HCTZ 12.5 mg daily. She was recently seen by cardiology for orthostatic hypotension and her BP medication was increased. No further interventions were ordered and much of her symptoms were thought to be related to anxiety.   She relates a long standing history of anxiety and depression. She does not seem to have much of a support system around her. She says she "self isolates" and has been doing so since 2012. Says everything fell apart in 2012, but doesn't say specifically what. She reports that she is close with her aunt but has a strained relationship with her sister. She says she has a history of PTSD, OCD, anxiety and depression. She says she was seeing a psychiatrist Penny Kim in [place] who was giving her  Klonopin for years. However, this was discontinued since she was not attending her appointments. She reports she is still getting 100 mg zoloft daily from Penny Kim. She would like to see somebody more locally. She denies SI/HI today.  She also reports issues with her left foot today. The bottom has multiple patches of sloughed off skin and areas of bleeding. She says she frequently picks at it.   Per chart review, there is apparently a history of avascular necrosis of her hips, 40% on each side on MRI, though this MRI is unavailable to me in the chart. She had recent hip Xrays  That did not show avascular necrosis. Nevertheless, patient with persistent bilateral hip pain. Says her sister had bilateral hip replacements before age 17.  She also has history of prediabetes, which has remained diet controlled.  She is due for flu and tetanus today.  Depression         This is a chronic problem.  Associated symptoms include fatigue, restlessness, decreased interest and appetite change.  Associated symptoms include no decreased concentration and no suicidal ideas.  Past treatments include SSRIs - Selective serotonin reuptake inhibitors.  Compliance with treatment is good.  Previous treatment provided mild relief.      Allergies  Allergen Reactions  . Prochlorperazine Other (See Comments)    SYNCOPE  . Methadone Hcl Itching  . Other   . Tape Rash     Current Outpatient Medications:  .  amLODipine (NORVASC) 2.5 MG tablet, Take 2 tablets (5 mg total) by mouth daily., Disp: 30 tablet, Rfl: 6 .  fluticasone (FLONASE) 50 MCG/ACT nasal spray, Place into both nostrils daily., Disp: , Rfl:  .  hydrochlorothiazide (MICROZIDE) 12.5 MG capsule, Take 1 capsule (12.5 mg total) by mouth daily., Disp: 30 capsule, Rfl: 6 .  phenylephrine (NASAL DECONGESTANT) 10 MG TABS tablet, Take 10 mg by mouth every 4 (four) hours as needed., Disp: , Rfl:  .  sertraline (ZOLOFT) 100 MG tablet, Take 100 mg by mouth daily.  Reported on 09/11/2015, Disp: , Rfl:  .  tamoxifen (NOLVADEX) 20 MG tablet, Take 1 tablet (20 mg total) by mouth daily., Disp: 30 tablet, Rfl: 1  Review of Systems  Constitutional: Positive for activity change, appetite change, diaphoresis and fatigue.  HENT: Positive for congestion and tinnitus.   Neurological: Positive for dizziness.  Hematological: Negative.   Psychiatric/Behavioral: Positive for depression. Negative for agitation, behavioral problems, confusion, decreased concentration, dysphoric mood, hallucinations, self-injury, sleep disturbance and suicidal ideas. The patient is not nervous/anxious and is not hyperactive.     Social History   Tobacco Use  . Smoking status: Never Smoker  . Smokeless tobacco: Never Used  Substance Use Topics  . Alcohol use: No    Frequency: Never    Comment: occasionally   Objective:   BP 132/78 (BP Location: Left Arm, Patient Position: Sitting, Cuff Size: Normal)   Pulse 72   Temp 98.1 F (36.7 C) (Oral)   Resp 16   Ht 5' 5" (1.651 m)   Wt 147 lb (66.7 kg)   BMI 24.46 kg/m  Vitals:   09/22/17 1532  BP: 132/78  Pulse: 72  Resp: 16  Temp: 98.1 F (36.7 C)  TempSrc: Oral  Weight: 147 lb (66.7 kg)  Height: 5' 5" (1.651 m)     Physical Exam  Constitutional: She is oriented to person, place, and time. She appears well-developed and well-nourished.  Fidgets and appears anxious.   Cardiovascular: Normal rate and regular rhythm.  Pulmonary/Chest: Effort normal and breath sounds normal.  Neurological: She is alert and oriented to person, place, and time.  Skin:  On the sole of her left foot, there are islands of old callused skin surrounding by new growth. There are multiple excoriations and evidence of bleeding.   Psychiatric: Her mood appears anxious. Her affect is blunt. Her speech is delayed. She exhibits a depressed mood. She expresses no homicidal and no suicidal ideation. She expresses no suicidal plans and no homicidal plans.          Assessment & Plan:     1. Malignant neoplasm of upper-inner quadrant of right breast in female, estrogen receptor positive ([place])  She has a follow up appointment for this on 09/26/2017.  2. Avascular necrosis of bones of both hips ([place])  I do not see the MRI where this was confirmed. I do see recent xrays which do not show AVN, however these would be less sensitive than an Mri. Will refer to ortho.  - Ambulatory referral to Orthopedics  3. Essential hypertension  Better control today.  4. Anxiety and depression  Seems to have severe anxiety, depression and other conditions related to anxiety. I will not restart her Klonopin. Think she would do better with a psychiatrist. She elects to stay on her zoloft until seen.   - Ambulatory referral to Psychiatry  5. Left foot pain  - Ambulatory referral to Podiatry  6. Flu vaccine need  -  Flu Vaccine QUAD 6+ mos PF IM (Fluarix Quad PF)  7. Need for Tdap vaccination  - Tdap vaccine greater than or equal to 7yo IM  8. Prediabetes  Diet controlled. Last lipid profile showed fairly good control of medications. BMP with cardiology normal. Will get in office A1c at next visit.  Return in about 3 months (around 12/21/2017) for Physical exam  with PAP.  The entirety of the information documented in the History of Present Illness, Review of Systems and Physical Exam were personally obtained by me. Portions of this information were initially documented by Ashley Royalty, CMA and reviewed by me for thoroughness and accuracy.   I have spent 45 minutes with this patient, >50% of which was spent on counseling and coordination of Kim.       Penny Post, PA-C  [facility]

## 2017-09-23 NOTE — Progress Notes (Signed)
ID: Penny Kim   DOB: 1971/01/16  MR#: [id number]  CSN#:[id number]  PCP:  Charolette Forward, NP GYN: [facility] SU: Rolm Bookbinder, MD OTHER MD: Crissie Reese, MD; Ovidio Hanger; Chucky May, MD, Hulan Saas M.D.,  CHIEF COMPLAINT:  Hx of Right Breast Cancer  CURRENT TREATMENT: Tamoxifen  BREAST CANCER HISTORY: From the original intake note:  Kaytelyn had noticed that her right breast dropped a little differently than the left, and occasionally she would have a strange sensation radiating to the right nipple, but she really did not pay much attention to this.  She had her first mammogram ever, a screening mammogram at [facility], 04/23/2010, and this showed diffuse calcifications in the lower inner quadrant of the right breast measuring up to 5.6 cm.  She was recalled 08/08, and Dr. March Rummage found the breast to be dense, which limits mammographic sensitivity, but again was able to demonstrate these calcifications throughout the lower inner quadrant of the right breast.  Biopsy was discussed, and performed 08/09, and this showed [phone]) ductal carcinoma in situ, high grade, which was estrogen receptor 100% and progesterone receptor 57% positive.  Bilateral breast MRIs were obtained 08/14.  This showed in the right breast the area of enhancement to total 7.7 cm anterior posteriorly.  Most of this was nodular and non-mass like, but in addition in the anterior aspect of this, there was a rounded area of mass-like enhancement measuring 1.7 cm.  This portion was suspicious for invasive ductal carcinoma.  The left breast was unremarkable, and there were no suspicious internal mammary or axillary lymph nodes noted.  Accordingly the patient was brought back for biopsy of the more mass-like area on 05/05/2010, and this biopsy ([id number]) showed a grade 2 invasive ductal carcinoma, which was estrogen receptor at 78% and progesterone receptor 82% positive.  The proliferation marker was  elevated at 85%.  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   Her subsequent history is as detailed below.  INTERVAL HISTORY: Penny Kim returns today for follow-up and treatment of her estrogen receptor positive breast cancer. She stopped taking tamoxifen in early 2018 when she lost her insurance under Obamacare. She notes that she signed up for [employer] who charged her $600 for insurance. She notes that she tried to apply for disability. While she was on tamoxifen, she notes having some initial hot flashes that subsided after a while. She also notes that she has sporadically has her period with her last being December 2018 and lasted 5 days.   Since her last visit, she completed a unilateral hip x-ray on 05/09/2017 showing: Minimal degenerative joint disease of the hips. No radiographic evidence of avascular necrosis.   REVIEW OF SYSTEMS: Penny Kim reports that the last few weeks have been rough for her. She notes some pain in her right foot has been bothering her.  She notes that she works at the [employer] and the [employer] center of [place]. She denies unusual headaches, visual changes, nausea, vomiting, or dizziness. There has been no unusual cough, phlegm production, or pleurisy. This been no change in bowel or bladder habits. She denies unexplained fatigue or unexplained weight loss, bleeding, rash, or fever. A detailed review of systems was otherwise stable.    PAST MEDICAL HISTORY:      Past Medical History  Diagnosis Date  . Anxiety   . Breast cancer, right breast ([place]) 02/2010    s/p chemo (completed 09/2010) and right mastectomy w/reconstruction  .  Depression   . HTN (hypertension)   . Cancer ([place])   . Allergy     PAST SURGICAL HISTORY:       Past Surgical History  Procedure Laterality Date  . Port-a-cath removal  11/26/2010  . Tissue expander removal  10/20/2010    left  . Portacath placement  07/23/2010   . Insertion of tissue expander after mastectomy  06/19/2010    bilat. mastectomies with bilat. tissue expanders  . Breast surgery  06/19/2010    exploration of right mastectomy site due to post-op bleeding  . Breast reconstruction  01/06/2012    Procedure: BREAST RECONSTRUCTION;  Surgeon: Crissie Reese, MD;  Location: [facility];  Service: Plastics;  Laterality: Bilateral;  bilateral removal of tissue expanders, bilateral placement of implants--Breast  Depression.  This has been present from the patient's early 20s.  She has never been hospitalized for this.  When she looks back on the antidepressants that she has used, she did best with Zoloft, but she tells me she had to discontinue that because of reflux issues.  She is followed by Dr. Kasandra Knudsen in [place] (clinic number [phone]), and he has recently changed her from Remeron to Salt Penny.  However, the Pristiq is $50 a month, and Penny Kim has not been able to afford it.  A second problem is borderline glucose intolerance (there is a strong family history of diabetes).  FAMILY HISTORY        Family History  Problem Relation Age of Onset  . Hypertension Mother   . Depression Mother   . Cancer Sister 30    Ovarian cancer  . Hypertension Maternal Grandmother   . Diabetes Maternal Grandmother   . 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 Hypertension Maternal Grandfather   . Diabetes Paternal Grandmother   . Hypertension Paternal Grandmother   . Kidney disease Paternal Grandmother     End stage renal dz - dialysis  . Hyperlipidemia Paternal Grandmother   . Alcohol abuse Father   . Depression Father   . Drug abuse Father 79    Died of drug overdose  The patient's father died at the age of 1 from drug overdose in the setting of ETOH abuse.  The patient's mother is alive.  She has a significant history of depression.  The patient has one sister, also with a history of  depression.  GYNECOLOGIC HISTORY:   (Updated 03/11/2014) She is GX, P0.  She stopped having periods with chemotherapy in approximately November 2011. These resumed following a fall in February 2015, and has been somewhat regular since that time. Last menstrual cycle was 02/11/2014. (Patient was reminded that tamoxifen is not a birth control method, and she does need to use barrier forms of birth control if and when necessary.)  SOCIAL HISTORY:  (Updated 09/26/2016 Penny Kim currently works with [employer], mostly part-time When asked who she would call in case of a problem, she really does not have anyone that she would call.  She does not feel her family is supportive, and she has no close friends.                           ADVANCED DIRECTIVES: Not in place  HEALTH MAINTENANCE:  (Updated 03/11/2014)       Social History  Substance Use Topics  . Smoking status: Never Smoker   . Smokeless tobacco: Never Used  . Alcohol Use:  0.6 oz/week    1 Glasses of wine per week     Comment: occasionally                Colonoscopy: Never             PAP: March 2015             Bone density: Never             Lipid panel:  November 2014       Allergies  Allergen Reactions  . Prochlorperazine Other (See Comments)    SYNCOPE  . Methadone Hcl Itching  . Tape Rash          Current Outpatient Prescriptions  Medication Sig Dispense Refill  . Ascorbic Acid (VITAMIN C) 1000 MG tablet Take 1,000 mg by mouth daily.    . Cholecalciferol (VITAMIN D3) 2000 UNITS TABS Take 1 tablet by mouth.    . clonazePAM (KLONOPIN) 1 MG tablet Take 1 mg by mouth 2 (two) times daily. Pt takes  1-2 tablets daily    . Ferrous Sulfate (IRON) 28 MG TABS Take 1 tablet by mouth daily.    . hydrochlorothiazide (HYDRODIURIL) 25 MG tablet TAKE 1/2 TABLET BY MOUTH EVERY MORNING 15 tablet 0  . lamoTRIgine (LAMICTAL) 150 MG tablet Take 150 mg by mouth daily. Reported on 09/11/2015    . Melatonin 5 MG TABS Take 1  tablet by mouth at bedtime as needed.    . Multiple Vitamins-Minerals (MULTI-VITAMIN GUMMIES) CHEW Chew 1 Dose by mouth daily.    . Omega-3 Fatty Acids (FISH OIL ADULT GUMMIES PO) Take 1 Dose by mouth daily.    . Probiotic Product (PROBIOTIC ADVANCED PO) Take 1 tablet by mouth daily.    . sertraline (ZOLOFT) 100 MG tablet Take 100 mg by mouth daily. Reported on 09/11/2015    . tamoxifen (NOLVADEX) 20 MG tablet Take 1 tablet (20 mg total) by mouth daily. 90 tablet 1   No current facility-administered medications for this visit.    OBJECTIVE:     Filed Vitals:   Vitals:   09/26/17 1304  BP: 134/78  Pulse: 66  Resp: 16  Temp: 97.7 F (36.5 C)  TempSrc: Oral  SpO2: 98%  Weight: 147 lb (66.7 kg)  Height: _0  (1.651 m)   Sclerae unicteric, pupils round and equal Oropharynx clear and moist No cervical or supraclavicular adenopathy Lungs no rales or rhonchi Heart regular rate and rhythm Abd soft, nontender, positive bowel sounds MSK no focal spinal tenderness, no upper extremity lymphedema Neuro: nonfocal, well oriented, appropriate affect Breasts: Status post bilateral mastectomies, with implants in place.  There is no evidence of local recurrence.  Both axillae are benign.   STUDIES: No results found.  LABS: CBC    Component Value Date/Time   WBC 6.8 09/01/2017 1205   WBC 3.9 (L) 08/06/2016 0846   RBC 4.36 09/01/2017 1205   RBC 4.48 08/06/2016 0846   HGB 12.5 09/01/2017 1205   HGB 12.9 10/13/2015 1406   HCT 36.3 09/01/2017 1205   HCT 37.1 10/13/2015 1406   PLT 273 09/01/2017 1205   MCV 83 09/01/2017 1205   MCV 83.9 10/13/2015 1406   MCH 28.7 09/01/2017 1205   MCH 29.2 10/13/2015 1406   MCH 28.1 03/22/2011 0532   MCHC 34.4 09/01/2017 1205   MCHC 33.9 08/06/2016 0846   RDW 14.2 09/01/2017 1205   RDW 12.7 10/13/2015 1406   LYMPHSABS 1.4 09/01/2017 1205   LYMPHSABS 1.6  10/13/2015 1406   MONOABS 0.4 10/13/2015 1406   EOSABS 0.2 09/01/2017 1205    BASOSABS 0.0 09/01/2017 1205   BASOSABS 0.0 10/13/2015 1406   CMP Latest Ref Rng & Units 09/01/2017 08/06/2016 10/13/2015  Glucose 65 - 99 mg/dL 104(H) 108(H) 114  BUN 6 - 24 mg/dL 6 12 10.2  Creatinine 0.57 - 1.00 mg/dL 0.80 0.87 1.0  Sodium 134 - 144 mmol/L 139 141 138  Potassium 3.5 - 5.2 mmol/L 4.5 3.5 3.6  Chloride 96 - 106 mmol/L 102 103 -  CO2 20 - 29 mmol/L _0 Calcium 8.7 - 10.2 mg/dL 9.8 9.1 9.5  Total Protein 6.0 - 8.3 g/dL - 6.9 7.4  Total Bilirubin 0.2 - 1.2 mg/dL - 0.3 <0.30  Alkaline Phos 39 - 117 U/L - 30(L) 38(L)  AST 0 - 37 U/L - 15 22  ALT 0 - 35 U/L - 11 15      ASSESSMENT: 47 y.o. Penny Kim woman with  a BRCA 2 mutation of uncertain significance:  (1)  status post bilateral mastectomies September 2011 for a right-sided T2 N0, stage IIA invasive ductal carcinoma, grade 3, estrogen and progesterone receptor positive, HER2 negative with an elevated MIB-1,   (2)  treated with 4 cycles of adjuvant doxorubicin/cyclophosphamide given in dose-dense fashion and 4 weekly doses of paclitaxel of 12 planned, discontinued because of neuropathy.    (3) She did not require radiation.   (4)  She started tamoxifen in March 2012          (a) stopped tamoxifen January 2018, resumed January 2019   PLAN:  Penny Kim will benefit from continuing tamoxifen, and we resumed that today.  I let her know that even if she does not have insurance we will be glad to see her as we frequently see patients without co-pays if they cannot afford it (she would have to arrange for this through our billing department).  Right now though she does have again in endurance and she is interested in resuming tamoxifen.  She has tolerated it well.  The plan will be to continue that through 2023.  I am going to see her again in August of this year.  She knows to call for any problems that may develop before then.     Penny Kim, Penny Dad, MD  09/26/17 1:20 PM Medical Oncology and Hematology [facility] [address] [place], [place] [address] Tel. [phone]    Fax. [phone]  This document serves as a record of services personally performed by Lurline Del, MD. It was created on his behalf by Sheron Nightingale, a trained medical scribe. The creation of this record is based on the scribe's personal observations and the provider's statements to them.   I have reviewed the above documentation for accuracy and completeness, and I agree with the above.

## 2017-09-26 ENCOUNTER — Inpatient Hospital Stay: Payer: BLUE CROSS/BLUE SHIELD | Attending: Oncology | Admitting: Oncology

## 2017-09-26 VITALS — BP 134/78 | HR 66 | Temp 97.7°F | Resp 16 | Ht 65.0 in | Wt 147.0 lb

## 2017-09-26 DIAGNOSIS — Z170 Estrogen receptor positive status [ER+]: Secondary | ICD-10-CM

## 2017-09-26 DIAGNOSIS — Z79810 Long term (current) use of selective estrogen receptor modulators (SERMs): Secondary | ICD-10-CM

## 2017-09-26 DIAGNOSIS — C50211 Malignant neoplasm of upper-inner quadrant of right female breast: Secondary | ICD-10-CM

## 2017-09-26 DIAGNOSIS — C50311 Malignant neoplasm of lower-inner quadrant of right female breast: Secondary | ICD-10-CM

## 2017-09-26 MED ORDER — TAMOXIFEN CITRATE 20 MG PO TABS: 20.0000 mg | ORAL_TABLET | Freq: Every day | ORAL | 4 refills | Status: DC

## 2017-09-28 ENCOUNTER — Telehealth: Payer: Self-pay | Admitting: Oncology

## 2017-09-28 NOTE — Telephone Encounter (Signed)
Scheduled appt per 1/7 los - Mid Aug 2019 - sent reminder letter in the mail with appt date and time.

## 2017-10-07 ENCOUNTER — Encounter: Payer: Self-pay | Admitting: Podiatry

## 2017-10-07 ENCOUNTER — Ambulatory Visit: Payer: BLUE CROSS/BLUE SHIELD

## 2017-10-07 ENCOUNTER — Ambulatory Visit: Payer: BLUE CROSS/BLUE SHIELD | Admitting: Podiatry

## 2017-10-07 DIAGNOSIS — B353 Tinea pedis: Secondary | ICD-10-CM

## 2017-10-07 DIAGNOSIS — R52 Pain, unspecified: Secondary | ICD-10-CM

## 2017-10-07 MED ORDER — HYDROCODONE-ACETAMINOPHEN 5-325 MG PO TABS: 1.0000 | ORAL_TABLET | Freq: Four times a day (QID) | ORAL | 0 refills | Status: DC | PRN

## 2017-10-07 MED ORDER — TRAMADOL HCL 50 MG PO TABS: 50.0000 mg | ORAL_TABLET | Freq: Three times a day (TID) | ORAL | 0 refills | Status: DC | PRN

## 2017-10-07 MED ORDER — CLOTRIMAZOLE-BETAMETHASONE 1-0.05 % EX CREA: 1.0000 "application " | TOPICAL_CREAM | Freq: Two times a day (BID) | CUTANEOUS | 2 refills | Status: DC

## 2017-10-07 MED ORDER — TERBINAFINE HCL 250 MG PO TABS: 250.0000 mg | ORAL_TABLET | Freq: Every day | ORAL | 0 refills | Status: DC

## 2017-10-10 NOTE — Progress Notes (Signed)
   HPI: 47 year old female presenting today as a new patient with a chief complaint of itching, burning and intermittent pain to the right foot that began 9-10 months ago. She reports a rash on the medial and lateral sides of the foot as well as the plantar aspect. She states the rash has gotten progressively worse. She has been applying Vaseline and Cerave cream with no significant relief. Patient is here for further evaluation and treatment.   Past Medical History:  Diagnosis Date  . Allergy   . Anxiety   . Breast cancer, right breast ([place]) 02/2010   s/p chemo (completed 09/2010) and right mastectomy w/reconstruction  . Depression   . History of stress test    a. treadmill Myoview 11/2016: small defect of mild severity present in the apex location felt to be secondary to breast attenuation. EF 55-65%. Normal study  . HTN (hypertension)   . Orthostatic hypotension   . Systolic dysfunction    a. TTE 10/2016: EF 50-55%, no RWMA, normal LV diastolic function, left atrium normal, RV systolic function normal, PASP normal     Physical Exam: General: The patient is alert and oriented x3 in no acute distress.  Dermatology: Pruritus to the right foot with hyperkeratosis. Skin is warm, dry and supple bilateral lower extremities. Negative for open lesions or macerations.  Vascular: Palpable pedal pulses bilaterally. No edema or erythema noted. Capillary refill within normal limits.  Neurological: Epicritic and protective threshold grossly intact bilaterally.   Musculoskeletal Exam: Range of motion within normal limits to all pedal and ankle joints bilateral. Muscle strength 5/5 in all groups bilateral.   Assessment: - Tinea pedis/athlete's foot right - severe   Plan of Care:  - Patient evaluated.  - Prescription for Lamisil 250 mg #28 provided to patient. - Prescription for Lotrisone cream provided to patient.  - Prescription for Tramadol provided to patient. - Prescription for Vicodin  5/325 mg provided to patient.  - Return to clinic in 4 weeks.    Edrick Kins, DPM [facility]  Dr. Edrick Kins, DPM    [address], [place] [address]                Office [phone]  Fax [phone]

## 2017-11-04 ENCOUNTER — Ambulatory Visit: Payer: BLUE CROSS/BLUE SHIELD | Admitting: Podiatry

## 2017-11-10 ENCOUNTER — Other Ambulatory Visit: Payer: Self-pay | Admitting: Orthopedic Surgery

## 2017-11-10 DIAGNOSIS — M87052 Idiopathic aseptic necrosis of left femur: Secondary | ICD-10-CM

## 2017-11-10 DIAGNOSIS — M87051 Idiopathic aseptic necrosis of right femur: Secondary | ICD-10-CM

## 2017-11-11 ENCOUNTER — Ambulatory Visit: Payer: BLUE CROSS/BLUE SHIELD | Admitting: Podiatry

## 2017-11-17 ENCOUNTER — Ambulatory Visit
Admission: RE | Admit: 2017-11-17 | Discharge: 2017-11-17 | Disposition: A | Discharge status: Home / Self Care | Payer: BLUE CROSS/BLUE SHIELD | Source: Ambulatory Visit | Attending: Orthopedic Surgery | Admitting: Orthopedic Surgery

## 2017-11-17 ENCOUNTER — Ambulatory Visit: Payer: BLUE CROSS/BLUE SHIELD

## 2017-11-17 DIAGNOSIS — M87051 Idiopathic aseptic necrosis of right femur: Secondary | ICD-10-CM | POA: Insufficient documentation

## 2017-11-17 DIAGNOSIS — M87052 Idiopathic aseptic necrosis of left femur: Secondary | ICD-10-CM

## 2017-11-17 DIAGNOSIS — M160 Bilateral primary osteoarthritis of hip: Secondary | ICD-10-CM | POA: Diagnosis not present

## 2017-11-18 ENCOUNTER — Ambulatory Visit: Payer: BLUE CROSS/BLUE SHIELD | Admitting: Podiatry

## 2017-11-23 NOTE — Progress Notes (Signed)
This encounter was created in error - please disregard.

## 2017-11-29 ENCOUNTER — Encounter: Payer: Self-pay | Admitting: Podiatry

## 2017-11-29 ENCOUNTER — Ambulatory Visit (INDEPENDENT_AMBULATORY_CARE_PROVIDER_SITE_OTHER): Payer: BLUE CROSS/BLUE SHIELD | Admitting: Podiatry

## 2017-11-29 DIAGNOSIS — B353 Tinea pedis: Secondary | ICD-10-CM

## 2017-11-29 MED ORDER — TERBINAFINE HCL 250 MG PO TABS: 250.0000 mg | ORAL_TABLET | Freq: Every day | ORAL | 0 refills | Status: DC

## 2017-11-29 NOTE — Progress Notes (Signed)
   HPI: 47 year old female presents the office today for follow-up evaluation and treatment regarding a itching burning rash to the right foot that began approximately 10 months ago.  Patient states that there has been some improvement since last visit.  She is completed the oral Lamisil #28 and has been applying the Lotrisone cream daily.  She presents today for further treatment evaluation  Past Medical History:  Diagnosis Date  . Allergy   . Anxiety   . Breast cancer, right breast ([place]) 02/2010   s/p chemo (completed 09/2010) and right mastectomy w/reconstruction  . Depression   . History of stress test    a. treadmill Myoview 11/2016: small defect of mild severity present in the apex location felt to be secondary to breast attenuation. EF 55-65%. Normal study  . HTN (hypertension)   . Orthostatic hypotension   . Systolic dysfunction    a. TTE 10/2016: EF 50-55%, no RWMA, normal LV diastolic function, left atrium normal, RV systolic function normal, PASP normal          Physical Exam: General: The patient is alert and oriented x3 in no acute distress.  Dermatology: Skin is warm, dry and supple bilateral lower extremities.  Superficial skin breakdown with vesicle lesions noted to the medial and lateral aspect of the foot with burning and pruritus.  Please see clinical picture.  Vascular: Palpable pedal pulses bilaterally. No edema or erythema noted. Capillary refill within normal limits.  Neurological: Epicritic and protective threshold grossly intact bilaterally.   Musculoskeletal Exam: Range of motion within normal limits to all pedal and ankle joints bilateral. Muscle strength 5/5 in all groups bilateral.   Assessment: -Tinea pedis right foot; severe; improved   Plan of Care:  - Patient evaluated.  -Today we are going to order a refill of Lamisil 250 mg #28 -Continue applying Lotrisone cream daily.  Patient states that the prescription was expensive.  An alternative to the  Lotrisone would be combination of a 50:50 mixture of over-the-counter antifungal cream with hydrocortisone cream daily. -Return to clinic in 4 weeks    Edrick Kins, DPM [facility]  Dr. Edrick Kins, DPM    [address], [place] [address]                Office [phone]  Fax [phone]

## 2017-12-01 LAB — HM PAP SMEAR: HM Pap smear: NEGATIVE

## 2017-12-21 ENCOUNTER — Encounter: Payer: BLUE CROSS/BLUE SHIELD | Admitting: Physician Assistant

## 2017-12-27 ENCOUNTER — Encounter: Payer: BLUE CROSS/BLUE SHIELD | Admitting: Podiatry

## 2017-12-30 ENCOUNTER — Encounter: Payer: Self-pay | Admitting: Physician Assistant

## 2017-12-30 ENCOUNTER — Ambulatory Visit (INDEPENDENT_AMBULATORY_CARE_PROVIDER_SITE_OTHER): Payer: BLUE CROSS/BLUE SHIELD | Admitting: Physician Assistant

## 2017-12-30 VITALS — BP 138/86 | HR 80 | Temp 98.9°F | Resp 16 | Ht 65.0 in | Wt 148.0 lb

## 2017-12-30 DIAGNOSIS — M87052 Idiopathic aseptic necrosis of left femur: Secondary | ICD-10-CM

## 2017-12-30 DIAGNOSIS — F329 Major depressive disorder, single episode, unspecified: Secondary | ICD-10-CM

## 2017-12-30 DIAGNOSIS — F419 Anxiety disorder, unspecified: Secondary | ICD-10-CM | POA: Diagnosis not present

## 2017-12-30 DIAGNOSIS — M87051 Idiopathic aseptic necrosis of right femur: Secondary | ICD-10-CM | POA: Diagnosis not present

## 2017-12-30 DIAGNOSIS — J309 Allergic rhinitis, unspecified: Secondary | ICD-10-CM

## 2017-12-30 DIAGNOSIS — I10 Essential (primary) hypertension: Secondary | ICD-10-CM | POA: Diagnosis not present

## 2017-12-30 DIAGNOSIS — Z0000 Encounter for general adult medical examination without abnormal findings: Secondary | ICD-10-CM

## 2017-12-30 DIAGNOSIS — F32A Depression, unspecified: Secondary | ICD-10-CM

## 2017-12-30 DIAGNOSIS — B353 Tinea pedis: Secondary | ICD-10-CM

## 2017-12-30 MED ORDER — HYDROCODONE-ACETAMINOPHEN 5-325 MG PO TABS: ORAL_TABLET | ORAL | 0 refills | Status: DC

## 2017-12-30 NOTE — Progress Notes (Signed)
Patient: Penny Kim, Female    DOB: [birth year], 47 y.o.   MRN: [id number] Visit Date: 12/30/2017  Today's Provider: Trinna Post, PA-C   Chief Complaint  Patient presents with  . Annual Exam   Subjective:    Annual physical exam Penny Kim is a 47 y.o. female who presents today for health maintenance and complete physical. She feels fairly well. Pt reports having trouble with sinuses and allergies.  She reports not exercising. She reports she is sleeping poorly.  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 Toy Care in [place], currently on Trintellix AVN of bilateral hips: evaluated at [facility], per her report she is not a candidate for revascularization and needs hip replacement.  Podiatry: tinea pedis, treated with lamisil/lotrimine by Dr. Amalia Hailey in podiatry HTN: says her blood pressure is high bc she hasn't taken her BP meds today.   She reports nasal and sinus congestion, post nasal drip, cough, and sore throat. She does not know what to take.  She also reports significant pain in her hip joints that have been present for years. She also endorses pain in her right foot which is being treated for tinea pedis. She says she takes [facility] powders frequently and these do not work. She says when she stands up, her hips will be in a lot of pain, and also she experiences bilateral hip pain when she lies down.  -----------------------------------------------------------------   Review of Systems  Constitutional: Positive for activity change and fatigue. Negative for appetite change, chills, diaphoresis, fever and unexpected weight change.  HENT: Positive for congestion, postnasal drip, rhinorrhea, sinus pressure, sneezing, sore throat and tinnitus. Negative for dental problem, drooling, ear discharge, ear pain, facial  swelling, hearing loss, mouth sores, nosebleeds, sinus pain, trouble swallowing and voice change.   Eyes: Negative.   Respiratory: Negative.   Cardiovascular: Negative.   Gastrointestinal: Positive for constipation. Negative for abdominal distention, abdominal pain, anal bleeding, blood in stool, diarrhea, nausea, rectal pain and vomiting.  Endocrine: Positive for polydipsia and polyuria. Negative for cold intolerance, heat intolerance and polyphagia.  Genitourinary: Negative.   Musculoskeletal: Positive for arthralgias, back pain, gait problem, joint swelling, myalgias, neck pain and neck stiffness.  Skin: Negative.   Allergic/Immunologic: Positive for environmental allergies. Negative for food allergies and immunocompromised state.  Neurological: Positive for dizziness. Negative for tremors, seizures, syncope, facial asymmetry, speech difficulty, weakness, light-headedness, numbness and headaches.  Hematological: Negative.   Psychiatric/Behavioral: Positive for behavioral problems, confusion, decreased concentration and dysphoric mood. Negative for agitation, hallucinations, self-injury, sleep disturbance and suicidal ideas. The patient is nervous/anxious. The patient is not hyperactive.     Social History      She  reports that she has never smoked. She has never used smokeless tobacco. She reports that she does not drink alcohol or use drugs.       Social History   Socioeconomic History  . Marital status: Single    Spouse name: Not on file  . Number of children: Not on file  . Years of education: 59  . Highest education level: Not on file  Occupational History  . Occupation: Land    Comment: [employer]  . Financial resource strain: Not on file  . Food insecurity:    Worry: Not on file  Inability: Not on file  . Transportation needs:    Medical: Not on file    Non-medical: Not on file  Tobacco Use  . Smoking  status: Never Smoker  . Smokeless tobacco: Never Used  Substance and Sexual Activity  . Alcohol use: No    Frequency: Never    Comment: occasionally  . Drug use: No  . Sexual activity: Not Currently    Birth control/protection: None  Lifestyle  . Physical activity:    Days per week: Not on file    Minutes per session: Not on file  . Stress: Not on file  Relationships  . Social connections:    Talks on phone: Not on file    Gets together: Not on file    Attends religious service: Not on file    Active member of club or organization: Not on file    Attends meetings of clubs or organizations: Not on file    Relationship status: Not on file  Other Topics Concern  . Not on file  Social History Narrative   Jacqlyn [place], [place]. She attended [employer] where she obtained her Bachelors of Science degree in Psychology. 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 She currently lives in [place]. She lives alone. Chaniyah enjoys attending live musical performances. 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     Past Medical History:  Diagnosis Date  . Allergy   . Anxiety   . Breast cancer, right breast ([place]) 02/2010   s/p chemo (completed 09/2010) and right mastectomy w/reconstruction  . Depression   . History of stress test    a. treadmill Myoview 11/2016: small defect of mild severity present in the apex location felt to be secondary to breast attenuation. EF 55-65%. Normal study  . HTN (hypertension)   . Orthostatic hypotension   . Systolic dysfunction    a. TTE 10/2016: EF 50-55%, no RWMA, normal LV diastolic function, left atrium normal, RV systolic function normal, PASP normal     Patient Active Problem List   Diagnosis Date Noted  . Rash 10/19/2016  . Malignant neoplasm of upper-inner quadrant of right breast in female, estrogen receptor positive ([place]) 10/11/2016  .  Prediabetes 08/06/2016  . Syncope 08/06/2016  . Genetic testing 10/07/2015  . Hyperlipidemia 09/11/2015  . Acquired absence of both breasts and nipples 04/22/2015  . Personal history of breast cancer 04/22/2015  . Avascular necrosis of bones of both hips ([place]) 11/22/2014  . Essential hypertension 07/30/2013  . Anxiety and depression 07/30/2013  . Breast cancer of upper-inner quadrant of right female breast ([place]) 08/24/2011    Past Surgical History:  Procedure Laterality Date  . BREAST RECONSTRUCTION  01/06/2012   Procedure: BREAST RECONSTRUCTION;  Surgeon: Crissie Reese, MD;  Location: [facility];  Service: Plastics;  Laterality: Bilateral;  bilateral removal of tissue expanders, bilateral placement of implants--Breast  . BREAST SURGERY  06/19/2010   exploration of right mastectomy site due to post-op bleeding  . INSERTION OF TISSUE EXPANDER AFTER MASTECTOMY  06/19/2010   bilat. mastectomies with bilat. tissue expanders  . PORT-A-CATH REMOVAL  11/26/2010  . PORTACATH PLACEMENT  07/23/2010  . TISSUE EXPANDER REMOVAL  10/20/2010   left    Family History        Family Status  Relation Name Status  . Mother  Deceased  . Sister  Alive  . MGM  Alive  .  MGF  Deceased  . PGM  Deceased  . Father  Deceased  . PGF  Deceased        Her family history includes Alcohol abuse in her father; Cancer in her maternal grandfather; Cancer (age of onset: 74) in her sister; Depression in her father and mother; Diabetes in her maternal grandmother and paternal grandmother; Drug abuse (age of onset: 9) in her father; Heart attack in her mother; Hyperlipidemia in her mother and paternal grandmother; Hypertension in her maternal grandfather, maternal grandmother, mother, and paternal grandmother; Kidney disease in her paternal grandmother.      Allergies  Allergen Reactions  . Prochlorperazine Other (See Comments)    SYNCOPE  . Methadone Hcl Itching  . Other   . Tape Rash     Current  Outpatient Medications:  .  tamoxifen (NOLVADEX) 20 MG tablet, Take 1 tablet (20 mg total) by mouth daily., Disp: 90 tablet, Rfl: 4 .  terbinafine (LAMISIL) 250 MG tablet, Take 1 tablet (250 mg total) by mouth daily., Disp: 28 tablet, Rfl: 0 .  vortioxetine HBr (TRINTELLIX) 20 MG TABS tablet, Take 20 mg by mouth daily., Disp: , Rfl:  .  amLODipine (NORVASC) 2.5 MG tablet, Take 2 tablets (5 mg total) by mouth daily., Disp: 30 tablet, Rfl: 6 .  clotrimazole-betamethasone (LOTRISONE) cream, Apply 1 application topically 2 (two) times daily. (Patient not taking: Reported on 12/30/2017), Disp: 45 g, Rfl: 2 .  fluticasone (FLONASE) 50 MCG/ACT nasal spray, Place into both nostrils daily., Disp: , Rfl:  .  hydrochlorothiazide (MICROZIDE) 12.5 MG capsule, Take 1 capsule (12.5 mg total) by mouth daily., Disp: 30 capsule, Rfl: 6 .  HYDROcodone-acetaminophen (NORCO/VICODIN) 5-325 MG tablet, Take 1 tablet by mouth every 6 (six) hours as needed for moderate pain. (Patient not taking: Reported on 12/30/2017), Disp: 30 tablet, Rfl: 0 .  phenylephrine (NASAL DECONGESTANT) 10 MG TABS tablet, Take 10 mg by mouth every 4 (four) hours as needed., Disp: , Rfl:  .  sertraline (ZOLOFT) 100 MG tablet, Take 100 mg by mouth daily. Reported on 09/11/2015, Disp: , Rfl:  .  traMADol (ULTRAM) 50 MG tablet, Take 1 tablet (50 mg total) by mouth every 8 (eight) hours as needed. (Patient not taking: Reported on 12/30/2017), Disp: 30 tablet, Rfl: 0   Patient Care Team: [name] as PCP - General (Physician Assistant)      Objective:   Vitals: BP 138/86 (BP Location: Left Arm, Patient Position: Sitting, Cuff Size: Normal)   Pulse 80   Temp 98.9 F (37.2 C) (Oral)   Resp 16   Ht 5\' 5"  (1.651 m)   Wt 148 lb (67.1 kg)   BMI 24.63 kg/m    Vitals:   12/30/17 1035  BP: 138/86  Pulse: 80  Resp: 16  Temp: 98.9 F (37.2 C)  TempSrc: Oral  Weight: 148 lb (67.1 kg)  Height: 5\' 5"  (1.651 m)     Physical Exam    Constitutional: She is oriented to person, place, and time. She appears well-developed and well-nourished.  HENT:  Head: Normocephalic.  Right Ear: External ear normal.  Left Ear: External ear normal.  Nose: Nose normal.  Mouth/Throat: Oropharynx is clear and moist. No oropharyngeal exudate.  Eyes: Pupils are equal, round, and reactive to light.  Neck: Neck supple.  Cardiovascular: Normal rate and regular rhythm.  Pulmonary/Chest: Effort normal and breath sounds normal.  Abdominal: Soft. Bowel sounds are normal.  Lymphadenopathy:    She has no cervical  adenopathy.  Neurological: She is alert and oriented to person, place, and time.  Skin: Skin is warm and dry. Rash noted.  Psychiatric: She has a normal mood and affect. Her behavior is normal.     Depression Screen PHQ 2/9 Scores 09/22/2017 10/19/2016 08/27/2015  PHQ - 2 Score 6 0 6  PHQ- 9 Score 23 - 21      Assessment & Plan:     Routine Health Maintenance and Physical Exam  Exercise Activities and Dietary recommendations Goals    None      Immunization History  Administered Date(s) Administered  . DTaP 03/16/1971, 07/20/1971, 03/07/1975, 05/27/1976  . IPV 03/16/1971, 07/20/1971, 03/07/1975, 05/27/1976  . Influenza Split 08/27/2015  . Influenza,inj,Quad PF,6+ Mos 09/08/2015, 09/22/2017  . Measles 05/23/1975  . Rubella 05/23/1975  . Tdap 09/22/2017    Health Maintenance  Topic Date Due  . INFLUENZA VACCINE  04/20/2018  . PAP SMEAR  11/21/2018  . TETANUS/TDAP  09/23/2027  . HIV Screening  Completed     Discussed health benefits of physical activity, and encouraged her to engage in regular exercise appropriate for her age and condition.    1. Annual physical exam  - Comprehensive metabolic panel  2. Avascular necrosis of bones of both hips ([facility])  We have had extensive discussion about this condition. Patient reports she is not a candidate for revascularization and needs hip replacements, however I do not  see where she has seen [facility] for this evaluation. Nevertheless, she has bilateral AVN of her hips as evidenced on MRI from 11/17/2017 and significant pain from this which interferes with walking. I have explained to her that this is not a condition that gets better and she will likely need hip replacements in the future. We have also gone over how she should engage in gently stretching after sitting or lying down for long periods and use NSAIDs as initial treatment for pain. I will give script for hydrocodone as below with explicit instructions that this is to be used for severe pain unresponsive to OTC medications. NOT to be used for sore throats or headaches. We have entered into controlled substance agreement, this is signed and will be scanned in. Opioid risk assessment tool conducted and she is at moderate risk, though I do think she has an identifiable source of pain from a known organic pathology. Not a great candidate for tramadol due to SSRI use and psychiatric treatment. Precautions given.   - HYDROcodone-acetaminophen (NORCO/VICODIN) 5-325 MG tablet; Take 1/2 tablet daily PRN for SEVERE pain in hips or feet. NOT to be taken for minor pains including sore throat and headache.  Dispense: 15 tablet; Refill: 0 - Comprehensive metabolic panel  3. Tinea pedis of both feet  See #2. Continue treatment with Dr. Amalia Hailey.  - HYDROcodone-acetaminophen (NORCO/VICODIN) 5-325 MG tablet; Take 1/2 tablet daily PRN for SEVERE pain in hips or feet. NOT to be taken for minor pains including sore throat and headache.  Dispense: 15 tablet; Refill: 0 - Comprehensive metabolic panel  4. Essential hypertension  Borderline high, she reports she is not taking her medications today. Will get CMET to check electrolytes.  5. Allergic rhinitis, unspecified seasonality, unspecified trigger  2nd gen antihistamine + flonase.   6. Anxiety and depression  Sees psychiatrist.  Return in about 3 months (around 03/31/2018)  for pain medication assessment .  The entirety of the information documented in the History of Present Illness, Review of Systems and Physical Exam were personally obtained by me.  Portions of this information were initially documented by Ashley Royalty, CMA and reviewed by me for thoroughness and accuracy.   I have spent 25 minutes with this patient regarding a significant, separately identifiable E&M service, >50% of which was spent on counseling and coordination of care. --------------------------------------------------------------------    Trinna Post, PA-C  [place]

## 2017-12-30 NOTE — Patient Instructions (Signed)

## 2018-01-02 NOTE — Progress Notes (Signed)
This encounter was created in error - please disregard.

## 2018-01-17 ENCOUNTER — Ambulatory Visit: Payer: BLUE CROSS/BLUE SHIELD | Admitting: Podiatry

## 2018-01-20 ENCOUNTER — Ambulatory Visit: Payer: BLUE CROSS/BLUE SHIELD | Admitting: Podiatry

## 2018-01-24 ENCOUNTER — Ambulatory Visit (INDEPENDENT_AMBULATORY_CARE_PROVIDER_SITE_OTHER): Payer: BLUE CROSS/BLUE SHIELD | Admitting: Podiatry

## 2018-01-24 ENCOUNTER — Encounter: Payer: Self-pay | Admitting: Podiatry

## 2018-01-24 DIAGNOSIS — L309 Dermatitis, unspecified: Secondary | ICD-10-CM | POA: Diagnosis not present

## 2018-01-29 ENCOUNTER — Encounter: Payer: Self-pay | Admitting: Podiatry

## 2018-02-05 NOTE — Progress Notes (Signed)
   HPI: Patient presents today for follow-up treatment evaluation regarding dermatitis to the right foot approximately 10 months ago.  She states that she completed the oral Lamisil #28 and she has been applying the Lotrisone cream daily.  She is now being treated for this condition for the past 2 months.  There has been some slight improvement.  Past Medical History:  Diagnosis Date  . Allergy   . Anxiety   . Breast cancer, right breast ([place]) 02/2010   s/p chemo (completed 09/2010) and right mastectomy w/reconstruction  . Depression   . History of stress test    a. treadmill Myoview 11/2016: small defect of mild severity present in the apex location felt to be secondary to breast attenuation. EF 55-65%. Normal study  . HTN (hypertension)   . Orthostatic hypotension   . Systolic dysfunction    a. TTE 10/2016: EF 50-55%, no RWMA, normal LV diastolic function, left atrium normal, RV systolic function normal, PASP normal     Physical Exam: General: The patient is alert and oriented x3 in no acute distress.  Dermatology: Superficial skin breakdown with vesicle lesions noted to the medial and lateral aspects of the weightbearing surfaces of the foot with burning and pruritus.  Vascular: Palpable pedal pulses bilaterally. No edema or erythema noted. Capillary refill within normal limits.  Neurological: Epicritic and protective threshold grossly intact bilaterally.   Musculoskeletal Exam: Range of motion within normal limits to all pedal and ankle joints bilateral. Muscle strength 5/5 in all groups bilateral.   Radiographic Exam:  Normal osseous mineralization. Joint spaces preserved. No fracture/dislocation/boney destruction.    Assessment: 1.  Tinea pedis versus pustular eczema right foot   Plan of Care:  1. Patient evaluated.   2.  Today we took a 2 mm punch biopsy of the lateral aspect of the foot and the central portion of the skin lesions.  The foot was prepped in aseptic manner and  local anesthesia infiltration was utilized.  Dry sterile dressing was applied. 3.  Until pathology results come back just continue the Lotrisone cream daily 4.  Return to clinic in 4 weeks      Edrick Kins, DPM [facility]  Dr. Edrick Kins, DPM    [address], [place] [address]                Office [phone]  Fax [phone]

## 2018-02-14 ENCOUNTER — Encounter: Payer: Self-pay | Admitting: Physician Assistant

## 2018-02-19 ENCOUNTER — Other Ambulatory Visit: Payer: Self-pay | Admitting: Physician Assistant

## 2018-02-21 ENCOUNTER — Encounter: Payer: Self-pay | Admitting: Podiatry

## 2018-02-21 ENCOUNTER — Ambulatory Visit (INDEPENDENT_AMBULATORY_CARE_PROVIDER_SITE_OTHER): Payer: BLUE CROSS/BLUE SHIELD | Admitting: Podiatry

## 2018-02-21 DIAGNOSIS — L403 Pustulosis palmaris et plantaris: Secondary | ICD-10-CM

## 2018-02-21 DIAGNOSIS — L309 Dermatitis, unspecified: Secondary | ICD-10-CM | POA: Diagnosis not present

## 2018-02-21 MED ORDER — CLOBETASOL PROPIONATE 0.05 % EX OINT: 1.0000 "application " | TOPICAL_OINTMENT | Freq: Two times a day (BID) | CUTANEOUS | 1 refills | Status: DC

## 2018-02-23 ENCOUNTER — Encounter: Payer: Self-pay | Admitting: Physician Assistant

## 2018-02-23 ENCOUNTER — Ambulatory Visit: Payer: BLUE CROSS/BLUE SHIELD | Admitting: Physician Assistant

## 2018-02-23 VITALS — BP 110/68 | HR 88 | Temp 98.3°F | Resp 16 | Wt 141.0 lb

## 2018-02-23 DIAGNOSIS — J0110 Acute frontal sinusitis, unspecified: Secondary | ICD-10-CM | POA: Diagnosis not present

## 2018-02-23 DIAGNOSIS — Z9114 Patient's other noncompliance with medication regimen: Secondary | ICD-10-CM | POA: Diagnosis not present

## 2018-02-23 DIAGNOSIS — Z91148 Patient's other noncompliance with medication regimen for other reason: Secondary | ICD-10-CM

## 2018-02-23 MED ORDER — AMOXICILLIN 875 MG PO TABS: 875.0000 mg | ORAL_TABLET | Freq: Two times a day (BID) | ORAL | 0 refills | Status: AC

## 2018-02-23 NOTE — Patient Instructions (Signed)

## 2018-02-23 NOTE — Progress Notes (Signed)
[place]  Chief Complaint  Patient presents with  . Sinusitis    Started about two weeks ago.    Subjective:    Patient ID: Penny Kim, female    DOB: [birth year], 47 y.o.   MRN: [id number]  Upper Respiratory Infection: Penny Kim is a 47 y.o. female  complaining of symptoms of a URI, possible sinusitis. Symptoms include congestion, cough and plugged sensation in both ears. Onset of symptoms was 2 weeks ago, gradually worsening since that time. She also c/o congestion, cough described as nonproductive, nasal congestion, post nasal drip, sinus pressure and wheezing for the past 2 weeks .  She is drinking plenty of fluids. Evaluation to date: none. Treatment to date: antihistamines, cough suppressants and decongestants. The treatment has provided no relief.   She also says that she used the pain medication "inappropriately" and went through it "so fast" and did not use it at all according to the contact that she signed.   Review of Systems  Constitutional: Positive for appetite change, chills and fatigue. Negative for activity change, diaphoresis, fever and unexpected weight change.  HENT: Positive for congestion, ear pain, postnasal drip, sinus pressure, sinus pain and sore throat. Negative for ear discharge, nosebleeds, rhinorrhea, sneezing, tinnitus and voice change.   Eyes: Negative.   Respiratory: Positive for cough, chest tightness and wheezing. Negative for apnea, choking, shortness of breath and stridor.   Gastrointestinal: Positive for constipation. Negative for abdominal distention, abdominal pain, anal bleeding, blood in stool, diarrhea, nausea, rectal pain and vomiting.  Neurological: Positive for light-headedness and headaches. Negative for dizziness.       Objective:   BP 110/68 (BP Location: Left Arm, Patient Position: Sitting, Cuff Size: Normal)   Pulse 88   Temp 98.3 F (36.8 C) (Oral)   Resp 16   Wt 141 lb (64 kg)    BMI 23.46 kg/m   Patient Active Problem List   Diagnosis Date Noted  . Rash 10/19/2016  . Malignant neoplasm of upper-inner quadrant of right breast in female, estrogen receptor positive ([place]) 10/11/2016  . Prediabetes 08/06/2016  . Syncope 08/06/2016  . Genetic testing 10/07/2015  . Hyperlipidemia 09/11/2015  . Acquired absence of both breasts and nipples 04/22/2015  . Personal history of breast cancer 04/22/2015  . Avascular necrosis of bones of both hips ([place]) 11/22/2014  . Essential hypertension 07/30/2013  . Anxiety and depression 07/30/2013  . Breast cancer of upper-inner quadrant of right female breast ([place]) 08/24/2011    Outpatient Encounter Medications as of 02/23/2018  Medication Sig  . amLODipine (NORVASC) 5 MG tablet Take 1 tablet (5 mg total) by mouth daily.  . clobetasol ointment (TEMOVATE) 6.60 % Apply 1 application topically 2 (two) times daily.  . clonazePAM (KLONOPIN) 1 MG tablet Take 1 mg by mouth 4 (four) times daily.  . fluticasone (FLONASE) 50 MCG/ACT nasal spray Place into both nostrils daily.  Marland Kitchen gabapentin (NEURONTIN) 300 MG capsule TAKE 1 CAPSULE BY MOUTH FOUR TIMES A DAY  . HYDROcodone-acetaminophen (NORCO/VICODIN) 5-325 MG tablet Take 1/2 tablet daily PRN for SEVERE pain in hips or feet. NOT to be taken for minor pains including sore throat and headache.  . phenylephrine (NASAL DECONGESTANT) 10 MG TABS tablet Take 10 mg by mouth every 4 (four) hours as needed.  . sertraline (ZOLOFT) 100 MG tablet Take 100 mg by mouth daily. Reported on 09/11/2015  . tamoxifen (NOLVADEX) 20 MG tablet Take 1 tablet (20 mg total) by mouth  daily.  . vortioxetine HBr (TRINTELLIX) 20 MG TABS tablet Take 20 mg by mouth daily.  Marland Kitchen amoxicillin (AMOXIL) 875 MG tablet Take 1 tablet (875 mg total) by mouth 2 (two) times daily for 7 days.  . hydrochlorothiazide (MICROZIDE) 12.5 MG capsule Take 1 capsule (12.5 mg total) by mouth daily.  Marland Kitchen terbinafine (LAMISIL) 250 MG tablet Take 1 tablet  (250 mg total) by mouth daily. (Patient not taking: Reported on 02/23/2018)   No facility-administered encounter medications on file as of 02/23/2018.     Allergies  Allergen Reactions  . Prochlorperazine Other (See Comments)    SYNCOPE  . Methadone Hcl Itching  . Other   . Tape Rash       Physical Exam  Constitutional: She is oriented to person, place, and time. She appears well-developed and well-nourished. No distress.  HENT:  Right Ear: External ear normal.  Left Ear: External ear normal.  Nose: Right sinus exhibits maxillary sinus tenderness and frontal sinus tenderness. Left sinus exhibits maxillary sinus tenderness and frontal sinus tenderness.  Mouth/Throat: Oropharynx is clear and moist. No oropharyngeal exudate, posterior oropharyngeal edema or posterior oropharyngeal erythema.  Tms opaque bilaterally   Eyes: Conjunctivae are normal. Right eye exhibits no discharge. Left eye exhibits no discharge.  Neck: Neck supple.  Cardiovascular: Normal rate and regular rhythm.  Pulmonary/Chest: Effort normal and breath sounds normal.  Lymphadenopathy:    She has cervical adenopathy.  Neurological: She is alert and oriented to person, place, and time.  Skin: Skin is warm and dry. She is not diaphoretic.  Psychiatric: She has a normal mood and affect. Her behavior is normal.       Assessment & Plan:   1. Acute non-recurrent frontal sinusitis  - amoxicillin (AMOXIL) 875 MG tablet; Take 1 tablet (875 mg total) by mouth 2 (two) times daily for 7 days.  Dispense: 14 tablet; Refill: 0  2. Pain management contract broken  By her own admission she has used prescribed pain medication inappropriately. Her pain contract with myself will be terminated and she will receive no more controlled substances for myself. If she has future pain management needs, she will be referred to pain management.   Return if symptoms worsen or fail to improve.   The entirety of the information documented  in the History of Present Illness, Review of Systems and Physical Exam were personally obtained by me. Portions of this information were initially documented by Ashley Royalty, CMA and reviewed by me for thoroughness and accuracy.

## 2018-02-26 NOTE — Progress Notes (Signed)
   HPI: 47 year old otherwise healthy female presents today for follow-up evaluation regarding pustular lesions to the bilateral plantar feet.  This began approximately 11 months prior.  Last visit on 01/24/2018 punch biopsy was taken and sent to pathology.  She presents today for follow-up treatment evaluation.  She only notices some slight improvement with the Lotrisone cream.  Past Medical History:  Diagnosis Date  . Allergy   . Anxiety   . Breast cancer, right breast ([place]) 02/2010   s/p chemo (completed 09/2010) and right mastectomy w/reconstruction  . Depression   . History of stress test    a. treadmill Myoview 11/2016: small defect of mild severity present in the apex location felt to be secondary to breast attenuation. EF 55-65%. Normal study  . HTN (hypertension)   . Orthostatic hypotension   . Systolic dysfunction    a. TTE 10/2016: EF 50-55%, no RWMA, normal LV diastolic function, left atrium normal, RV systolic function normal, PASP normal     Physical Exam: General: The patient is alert and oriented x3 in no acute distress.  Dermatology: Diffuse pustular lesions with dermatitis noted throughout the weightbearing surface of the bilateral feet.  Vascular: Palpable pedal pulses bilaterally. No edema or erythema noted. Capillary refill within normal limits.  Neurological: Epicritic and protective threshold grossly intact bilaterally.   Musculoskeletal Exam: Range of motion within normal limits to all pedal and ankle joints bilateral. Muscle strength 5/5 in all groups bilateral.   Pathology Results: Chronic spongiotic and psoriasiform dermatitis  Assessment: 1.  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 Patient evaluated.  Pathology results reviewed 2.  Today we are going to discontinue the oral Lamisil and topical Lotrisone cream. 3.  Prescription for Temovate cream 4.  Return to clinic in 4 weeks      Edrick Kins, DPM [facility]  Dr. Edrick Kins, DPM    [address], [place] [address]                Office [phone]  Fax [phone]

## 2018-03-03 ENCOUNTER — Other Ambulatory Visit: Payer: Self-pay | Admitting: Physician Assistant

## 2018-03-17 ENCOUNTER — Other Ambulatory Visit: Payer: Self-pay | Admitting: Physician Assistant

## 2018-03-21 ENCOUNTER — Telehealth: Payer: Self-pay | Admitting: Internal Medicine

## 2018-03-21 ENCOUNTER — Encounter: Payer: BLUE CROSS/BLUE SHIELD | Admitting: Podiatry

## 2018-03-21 NOTE — Telephone Encounter (Signed)
Attempted to call patient and schedule appointment  lmov for her to call back

## 2018-03-21 NOTE — Telephone Encounter (Signed)
-----   Message from Janan Ridge, [place] sent at 03/17/2018 11:49 AM EDT ----- Regarding: patient needs an appointment  Can you try to reach out to this patient and see if you can schedule a follow up appointment. If not refills will have to go through her Primary Care Provider.   Thank you!

## 2018-03-27 NOTE — Progress Notes (Signed)
This encounter was created in error - please disregard.

## 2018-03-28 MED ORDER — AMLODIPINE BESYLATE 5 MG PO TABS: 5.0000 mg | ORAL_TABLET | Freq: Every day | ORAL | 0 refills | Status: DC

## 2018-03-28 MED ORDER — HYDROCHLOROTHIAZIDE 12.5 MG PO CAPS: 12.5000 mg | ORAL_CAPSULE | Freq: Every day | ORAL | 0 refills | Status: DC

## 2018-03-28 NOTE — Telephone Encounter (Signed)
Pt is scheduled 05/15/18    Please send in refills, patient states she needs just enough to get her to the appointment

## 2018-03-28 NOTE — Addendum Note (Signed)
Addended by: Anselm Pancoast on: 03/28/2018 11:23 AM   Modules accepted: Orders

## 2018-04-04 ENCOUNTER — Ambulatory Visit: Payer: BLUE CROSS/BLUE SHIELD | Admitting: Physician Assistant

## 2018-04-07 ENCOUNTER — Encounter: Payer: Self-pay | Admitting: Physician Assistant

## 2018-04-14 ENCOUNTER — Encounter: Payer: Self-pay | Admitting: Podiatry

## 2018-04-14 ENCOUNTER — Ambulatory Visit (INDEPENDENT_AMBULATORY_CARE_PROVIDER_SITE_OTHER): Payer: BLUE CROSS/BLUE SHIELD | Admitting: Podiatry

## 2018-04-14 DIAGNOSIS — L403 Pustulosis palmaris et plantaris: Secondary | ICD-10-CM

## 2018-04-15 NOTE — Progress Notes (Signed)
   HPI: 47 year old otherwise healthy female presents today for follow-up evaluation regarding pustular lesions to the bilateral plantar feet. She states the rash has improved. She has been using the Temovate cream as directed. There are no modifying factors. Patient is here for further evaluation and treatment.   Past Medical History:  Diagnosis Date  . Allergy   . Anxiety   . Breast cancer, right breast ([place]) 02/2010   s/p chemo (completed 09/2010) and right mastectomy w/reconstruction  . Depression   . History of stress test    a. treadmill Myoview 11/2016: small defect of mild severity present in the apex location felt to be secondary to breast attenuation. EF 55-65%. Normal study  . HTN (hypertension)   . Orthostatic hypotension   . Systolic dysfunction    a. TTE 10/2016: EF 50-55%, no RWMA, normal LV diastolic function, left atrium normal, RV systolic function normal, PASP normal     Objective: Physical Exam General: The patient is alert and oriented x3 in no acute distress.  Dermatology: Skin is cool, dry and supple bilateral lower extremities. Negative for open lesions or macerations.  Vascular: Palpable pedal pulses bilaterally. No edema or erythema noted. Capillary refill within normal limits.  Neurological: Epicritic and protective threshold grossly intact bilaterally.   Musculoskeletal Exam: All pedal and ankle joints range of motion within normal limits bilateral. Muscle strength 5/5 in all groups bilateral.   Assessment: 1. 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 Patient evaluated.   2. Continue using topical Temovate cream daily.  3. Recommended good shoe gear.  4. Return to clinic as needed.       Edrick Kins, DPM [facility]  Dr. Edrick Kins, DPM    [address], [place] [address]                Office [phone]  Fax [phone]

## 2018-04-27 NOTE — Progress Notes (Signed)
ID: Penny Kim   DOB: 1971/06/19  MR#: [id number]  CSN#:[id number]  PCP:  Charolette Forward, NP GYN: [facility] SU: Rolm Bookbinder, MD OTHER MD: Crissie Reese, MD; Ovidio Hanger; Chucky May, MD, Hulan Saas M.D.,  CHIEF COMPLAINT:   Estrogen receptor positive breast cancer  CURRENT TREATMENT: Tamoxifen  BREAST CANCER HISTORY: From the original intake note:  [name] had noticed that her right breast dropped a little differently than the left, and occasionally she would have a strange sensation radiating to the right nipple, but she really did not pay much attention to this.  She had her first mammogram ever, a screening mammogram at [facility], 04/23/2010, and this showed diffuse calcifications in the lower inner quadrant of the right breast measuring up to 5.6 cm.  She was recalled 08/08, and Dr. March Rummage found the breast to be dense, which limits mammographic sensitivity, but again was able to demonstrate these calcifications throughout the lower inner quadrant of the right breast.  Biopsy was discussed, and performed 08/09, and this showed [phone]) ductal carcinoma in situ, high grade, which was estrogen receptor 100% and progesterone receptor 57% positive.  Bilateral breast MRIs were obtained 08/14.  This showed in the right breast the area of enhancement to total 7.7 cm anterior posteriorly.  Most of this was nodular and non-mass like, but in addition in the anterior aspect of this, there was a rounded area of mass-like enhancement measuring 1.7 cm.  This portion was suspicious for invasive ductal carcinoma.  The left breast was unremarkable, and there were no suspicious internal mammary or axillary lymph nodes noted.  Accordingly the patient was brought back for biopsy of the more mass-like area on 05/05/2010, and this biopsy ([id number]) showed a grade 2 invasive ductal carcinoma, which was estrogen receptor at 78% and progesterone receptor 82% positive.  The proliferation  marker was elevated at 85%.  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   Her subsequent history is as detailed below.  INTERVAL HISTORY: Penny Kim returns today for follow-up and treatment of her estrogen receptor positive breast cancer.   She continues on tamoxifen, with good tolerance. She reports increase in vaginal wetness that requires her to wear a pad. She has hot flashes primarily at night. She has sporadic menstrual cycles that has been heavy a couple of times and irregular. She has had up to 8 menstrual cycles since 2011.    REVIEW OF SYSTEMS: Penny Kim reports that she is not exercising at this time.. She notes that she has been "up and down" following being informed that she has to have bilateral hip replacement that will be performed by Dr. Harlow Mares in [place], [place]. She reports that she has been working odd jobs since 2012.  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 She was advised by her orthopedist, Dr. Harlow Mares to post pone her surgery that was slated for December 2019 to now have a MRI in December instead. She has psoriasis to her right foot. She has dryness and discoloration to her hands which she feels is due to psoriasis as well.. She denies unusual headaches, visual changes, nausea, vomiting, or dizziness. There has been no unusual cough, phlegm production, or pleurisy. This been no change in bowel or bladder habits. She denies  unexplained fatigue or unexplained weight loss, bleeding, rash, or fever. A detailed review of systems was otherwise stable.    PAST MEDICAL HISTORY: Past Medical History:  Diagnosis Date  . Allergy   . Anxiety   . Breast cancer, right breast ([place]) 02/2010   s/p chemo (completed 09/2010) and right  mastectomy w/reconstruction  . Depression   . History of stress test    a. treadmill Myoview 11/2016: small defect of mild severity present in the apex location felt to be secondary to breast attenuation. EF 55-65%. Normal study  . HTN (hypertension)   . Orthostatic hypotension   . Systolic dysfunction    a. TTE 11/25/2016: EF 50-55%, no RWMA, normal LV diastolic function, left atrium normal, RV systolic function normal, PASP normal     PAST SURGICAL HISTORY: Past Surgical History:  Procedure Laterality Date  . BREAST RECONSTRUCTION  01/06/2012   Procedure: BREAST RECONSTRUCTION;  Surgeon: Crissie Reese, MD;  Location: [facility];  Service: Plastics;  Laterality: Bilateral;  bilateral removal of tissue expanders, bilateral placement of implants--Breast  . BREAST SURGERY  06/19/2010   exploration of right mastectomy site due to post-op bleeding  . INSERTION OF TISSUE EXPANDER AFTER MASTECTOMY  06/19/2010   bilat. mastectomies with bilat. tissue expanders  . PORT-A-CATH REMOVAL  11/26/2010  . PORTACATH PLACEMENT  07/23/2010  . TISSUE EXPANDER REMOVAL  10/20/2010   left   FAMILY HISTORY  Family History  Problem Relation Age of Onset  . Hypertension Mother   . Depression Mother   . Heart attack Mother   . Hyperlipidemia Mother   . Cancer Sister 30       Ovarian cancer  . Hypertension Maternal Grandmother   . Diabetes Maternal Grandmother   . 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 Hypertension Maternal Grandfather   . Diabetes Paternal Grandmother   . Hypertension Paternal Grandmother   . Kidney disease Paternal Grandmother        End stage renal dz - dialysis  . Hyperlipidemia Paternal Grandmother   . Alcohol abuse Father   . Depression Father   . Drug abuse Father 24       Died of drug overdose  The patient's father died at the age of 53 from drug overdose in the setting of ETOH abuse.  The patient's mother died in 11-26-2015.Marland Kitchen  She has a  significant history of depression.  The patient has one sister, also with a history of depression.  GYNECOLOGIC HISTORY:   (Updated 03/11/2014) She is GX, P0.  She stopped having periods with chemotherapy in approximately November 2011. These resumed following a fall in 11-25-13, and have been somewhat irregular since that time.  (Patient is aware that tamoxifen is not a birth control method, and she does need to use barrier forms of birth control if and when necessary.)  SOCIAL HISTORY:  (Updated 09/26/2016 Penny Kim continues to work at various jobs, mostly part-time When asked who she would call in case of a problem, she really does not have anyone that she would call.  She does not feel her family is supportive, and she has no close friends.                           ADVANCED DIRECTIVES: Not in place  HEALTH MAINTENANCE:  (Updated 03/11/2014) Social History   Tobacco Use  . Smoking status: Never  Smoker  . Smokeless tobacco: Never Used  Substance Use Topics  . Alcohol use: No    Frequency: Never    Comment: occasionally  . Drug use: No                Colonoscopy: Never             PAP: March 2015             Bone density: Never             Lipid panel:  November 2014  Allergies  Allergen Reactions  . Prochlorperazine Other (See Comments)    SYNCOPE  . Methadone Hcl Itching  . Other   . Tape Rash     Current Outpatient Medications  Medication Sig Dispense Refill  . amLODipine (NORVASC) 5 MG tablet Take 1 tablet (5 mg total) by mouth daily. 30 tablet 0  . clobetasol ointment (TEMOVATE) 6.25 % Apply 1 application topically 2 (two) times daily. 60 g 1  . clonazePAM (KLONOPIN) 1 MG tablet Take 1 mg by mouth 4 (four) times daily.  3  . fluticasone (FLONASE) 50 MCG/ACT nasal spray Place into both nostrils daily.    Marland Kitchen gabapentin (NEURONTIN) 300 MG capsule TAKE 1 CAPSULE BY MOUTH FOUR TIMES A DAY  4  . hydrochlorothiazide (MICROZIDE) 12.5 MG capsule Take 1 capsule (12.5  mg total) by mouth daily. 30 capsule 0  . HYDROcodone-acetaminophen (NORCO/VICODIN) 5-325 MG tablet Take 1/2 tablet daily PRN for SEVERE pain in hips or feet. NOT to be taken for minor pains including sore throat and headache. 15 tablet 0  . phenylephrine (NASAL DECONGESTANT) 10 MG TABS tablet Take 10 mg by mouth every 4 (four) hours as needed.    . sertraline (ZOLOFT) 100 MG tablet Take 100 mg by mouth daily. Reported on 09/11/2015    . tamoxifen (NOLVADEX) 20 MG tablet Take 1 tablet (20 mg total) by mouth daily. 90 tablet 4  . terbinafine (LAMISIL) 250 MG tablet Take 1 tablet (250 mg total) by mouth daily. (Patient not taking: Reported on 02/23/2018) 28 tablet 0  . vortioxetine HBr (TRINTELLIX) 20 MG TABS tablet Take 20 mg by mouth daily.     No current facility-administered medications for this visit.     OBJECTIVE: Young African-American woman who appears stated age, examined with scribe in room    Filed Vitals:   Vitals:   05/03/18 1550  BP: (!) 163/89  Pulse: 97  Resp: 18  Temp: 98.5 F (36.9 C)  TempSrc: Oral  SpO2: 99%  Weight: 136 lb 3.2 oz (61.8 kg)  Height: '5\' 5"'  (1.651 m)   Sclerae unicteric, EOMs intact Oropharynx clear and moist No cervical or supraclavicular adenopathy Lungs no rales or rhonchi Heart regular rate and rhythm Abd soft, nontender, positive bowel sounds MSK no focal spinal tenderness, no upper extremity lymphedema Neuro: nonfocal, well oriented, borderline manic affect Breasts: Status post bilateral mastectomies.  There are bilateral implants in place.  There is no evidence of chest wall recurrence.  Both axillae are benign.  STUDIES: Hip films reviewed with the patient  LABS: CBC    Component Value Date/Time   WBC 6.8 09/01/2017 1205   WBC 3.9 (L) 08/06/2016 0846   RBC 4.36 09/01/2017 1205   RBC 4.48 08/06/2016 0846   HGB 12.5 09/01/2017 1205   HGB 12.9 10/13/2015 1406   HCT 36.3 09/01/2017 1205   HCT 37.1 10/13/2015 1406   PLT 273  09/01/2017 1205  MCV 83 09/01/2017 1205   MCV 83.9 10/13/2015 1406   MCH 28.7 09/01/2017 1205   MCH 29.2 10/13/2015 1406   MCH 28.1 03/22/2011 0532   MCHC 34.4 09/01/2017 1205   MCHC 33.9 08/06/2016 0846   RDW 14.2 09/01/2017 1205   RDW 12.7 10/13/2015 1406   LYMPHSABS 1.4 09/01/2017 1205   LYMPHSABS 1.6 10/13/2015 1406   MONOABS 0.4 10/13/2015 1406   EOSABS 0.2 09/01/2017 1205   BASOSABS 0.0 09/01/2017 1205   BASOSABS 0.0 10/13/2015 1406   CMP Latest Ref Rng & Units 09/01/2017 08/06/2016 10/13/2015  Glucose 65 - 99 mg/dL 104(H) 108(H) 114  BUN 6 - 24 mg/dL 6 12 10.2  Creatinine 0.57 - 1.00 mg/dL 0.80 0.87 1.0  Sodium 134 - 144 mmol/L 139 141 138  Potassium 3.5 - 5.2 mmol/L 4.5 3.5 3.6  Chloride 96 - 106 mmol/L 102 103 -  CO2 20 - 29 mmol/L '25 30 27  ' Calcium 8.7 - 10.2 mg/dL 9.8 9.1 9.5  Total Protein 6.0 - 8.3 g/dL - 6.9 7.4  Total Bilirubin 0.2 - 1.2 mg/dL - 0.3 <0.30  Alkaline Phos 39 - 117 U/L - 30(L) 38(L)  AST 0 - 37 U/L - 15 22  ALT 0 - 35 U/L - 11 15      ASSESSMENT: 48 y.o. Penny Kim woman with  a BRCA 2 mutation of uncertain significance:  (1)  status post bilateral mastectomies September 2011 for a right-sided T2 N0, stage IIA invasive ductal carcinoma, grade 3, estrogen and progesterone receptor positive, HER2 negative with an elevated MIB-1,   (2)  treated with 4 cycles of adjuvant doxorubicin/cyclophosphamide given in dose-dense fashion and 4 weekly doses of paclitaxel of 12 planned, discontinued because of neuropathy.    (3) She did not require radiation.   (4)  She started tamoxifen in March 2012          (a) stopped tamoxifen January 2018, resumed January 2019   PLAN:  Penny Kim is now 8 years out from definitive surgery for breast cancer with no evidence of disease recurrence.  This is favorable.  She continues on tamoxifen, with fair tolerance.  The plan will be to continue that a total of 10 years.  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  She is followed by orthopedics in [place] at this point.  She is applying for disability and asked me to write a letter.  I will be glad to at her request.  Otherwise the plan is to continue tamoxifen to total 10 years.  She will see me again a year from now  She knows to call for any other issues that may develop before then.      Magrinat, Virgie Dad, MD  05/03/18 4:11 PM Medical Oncology and Hematology [facility] [address] [place], [place] [address] Tel. [phone]    Fax. [phone]    I, Soijett Blue am acting as scribe for Dr. Sarajane Jews C. Magrinat.  I, Lurline Del MD, have reviewed the above documentation for accuracy and completeness, and I agree with the above.

## 2018-05-03 ENCOUNTER — Inpatient Hospital Stay: Payer: BLUE CROSS/BLUE SHIELD | Attending: Oncology | Admitting: Oncology

## 2018-05-03 ENCOUNTER — Telehealth: Payer: Self-pay

## 2018-05-03 VITALS — BP 163/89 | HR 97 | Temp 98.5°F | Resp 18 | Ht 65.0 in | Wt 136.2 lb

## 2018-05-03 DIAGNOSIS — Z79810 Long term (current) use of selective estrogen receptor modulators (SERMs): Secondary | ICD-10-CM

## 2018-05-03 DIAGNOSIS — C50211 Malignant neoplasm of upper-inner quadrant of right female breast: Secondary | ICD-10-CM

## 2018-05-03 DIAGNOSIS — C50311 Malignant neoplasm of lower-inner quadrant of right female breast: Secondary | ICD-10-CM

## 2018-05-03 DIAGNOSIS — Z170 Estrogen receptor positive status [ER+]: Secondary | ICD-10-CM | POA: Diagnosis not present

## 2018-05-03 DIAGNOSIS — M8700 Idiopathic aseptic necrosis of unspecified bone: Secondary | ICD-10-CM

## 2018-05-03 MED ORDER — TAMOXIFEN CITRATE 20 MG PO TABS: 20.0000 mg | ORAL_TABLET | Freq: Every day | ORAL | 4 refills | Status: DC

## 2018-05-03 NOTE — Telephone Encounter (Signed)
Printed avs and calender [employer]

## 2018-05-10 ENCOUNTER — Other Ambulatory Visit: Payer: Self-pay | Admitting: Physician Assistant

## 2018-05-12 ENCOUNTER — Encounter: Payer: Self-pay | Admitting: Physician Assistant

## 2018-05-12 ENCOUNTER — Ambulatory Visit (INDEPENDENT_AMBULATORY_CARE_PROVIDER_SITE_OTHER): Payer: BLUE CROSS/BLUE SHIELD | Admitting: Physician Assistant

## 2018-05-12 VITALS — BP 150/100 | HR 100 | Temp 98.5°F | Resp 16 | Wt 134.0 lb

## 2018-05-12 DIAGNOSIS — I10 Essential (primary) hypertension: Secondary | ICD-10-CM

## 2018-05-12 DIAGNOSIS — J301 Allergic rhinitis due to pollen: Secondary | ICD-10-CM

## 2018-05-12 MED ORDER — FLUTICASONE PROPIONATE 50 MCG/ACT NA SUSP: 2.0000 | Freq: Every day | NASAL | 6 refills | Status: DC

## 2018-05-12 NOTE — Progress Notes (Signed)
Patient: Penny Kim Female    DOB: 1971-01-16   47 y.o.   MRN: [id number] Visit Date: 05/12/2018  Today's Provider: Trinna Post, PA-C   No chief complaint on file.  Subjective:    HPI Patient here today to F/U on sinus infection. Patient reports good compliance and tolerance of Amoxicillin. Patient reports she still has a lot of problems with nasal congestion. Patient reports she used OTC nasal decongestant which is Afrin, she never leaves home without it. She does not use allergy medication.   BP Readings from Last 3 Encounters:  05/12/18 (!) 150/100  05/03/18 (!) 163/89  02/23/18 110/68       Allergies  Allergen Reactions  . Prochlorperazine Other (See Comments)    SYNCOPE  . Methadone Hcl Itching  . Other   . Tape Rash     Current Outpatient Medications:  .  amLODipine (NORVASC) 5 MG tablet, Take 1 tablet (5 mg total) by mouth daily., Disp: 30 tablet, Rfl: 0 .  clobetasol ointment (TEMOVATE) 3.00 %, Apply 1 application topically 2 (two) times daily., Disp: 60 g, Rfl: 1 .  clonazePAM (KLONOPIN) 1 MG tablet, Take 1 mg by mouth 4 (four) times daily., Disp: , Rfl: 3 .  gabapentin (NEURONTIN) 300 MG capsule, TAKE 1 CAPSULE BY MOUTH FOUR TIMES A DAY, Disp: , Rfl: 4 .  hydrochlorothiazide (MICROZIDE) 12.5 MG capsule, Take 1 capsule (12.5 mg total) by mouth daily., Disp: 30 capsule, Rfl: 0 .  phenylephrine (NASAL DECONGESTANT) 10 MG TABS tablet, Take 10 mg by mouth every 4 (four) hours as needed., Disp: , Rfl:  .  tamoxifen (NOLVADEX) 20 MG tablet, Take 1 tablet (20 mg total) by mouth daily., Disp: 90 tablet, Rfl: 4 .  vortioxetine HBr (TRINTELLIX) 20 MG TABS tablet, Take 20 mg by mouth daily., Disp: , Rfl:  .  fluticasone (FLONASE) 50 MCG/ACT nasal spray, Place 2 sprays into both nostrils daily., Disp: 16 g, Rfl: 6  Review of Systems  Constitutional: Negative.   HENT: Positive for congestion and rhinorrhea.   Cardiovascular: Negative.     Social History    Tobacco Use  . Smoking status: Never Smoker  . Smokeless tobacco: Never Used  Substance Use Topics  . Alcohol use: No    Frequency: Never    Comment: occasionally   Objective:   BP (!) 150/100 (BP Location: Left Arm, Patient Position: Sitting, Cuff Size: Normal)   Pulse 100   Temp 98.5 F (36.9 C) (Oral)   Resp 16   Wt 134 lb (60.8 kg)   SpO2 98%   BMI 22.30 kg/m  Vitals:   05/12/18 1449  BP: (!) 150/100  Pulse: 100  Resp: 16  Temp: 98.5 F (36.9 C)  TempSrc: Oral  SpO2: 98%  Weight: 134 lb (60.8 kg)     Physical Exam  Constitutional: She is oriented to person, place, and time. She appears well-developed and well-nourished.  HENT:  Right Ear: External ear normal.  Left Ear: External ear normal.  Nose: Rhinorrhea present.  Mouth/Throat: Oropharynx is clear and moist. No oropharyngeal exudate.  Patient sounds audibly congested, is continuously snuffling in the exam room.   Eyes: Conjunctivae are normal.  Cardiovascular: Normal rate and regular rhythm.  Pulmonary/Chest: Effort normal and breath sounds normal.  Neurological: She is alert and oriented to person, place, and time.  Psychiatric: She has a normal mood and affect. Her behavior is normal.  Assessment & Plan:     1. Allergic rhinitis due to pollen, unspecified seasonality  Instructed to STOP Afrin which is causing rebound nasal congestion and is also elevating his BP. She should also take a daily Allegra.   - fluticasone (FLONASE) 50 MCG/ACT nasal spray; Place 2 sprays into both nostrils daily.  Dispense: 16 g; Refill: 6  2. Hypertension, unspecified type  Do believe continuous use of Afrin is elevating her BP. She needs to STOP USING THIS at all. She is seeing cardiology on 05/15/2018 and can check her blood pressure then. Chart will be forwarded to cardiology for their knowledge, do think it's impacting her BP.   Return in about 1 month (around 06/12/2018), or if symptoms worsen or fail to  improve.  The entirety of the information documented in the History of Present Illness, Review of Systems and Physical Exam were personally obtained by me. Portions of this information were initially documented by Lynford Humphrey, CMA and reviewed by me for thoroughness and accuracy.          Trinna Post, PA-C  [facility]

## 2018-05-12 NOTE — Patient Instructions (Signed)
STOP using Afrin Start using allegra one tablet daily    Allergic Rhinitis, Adult Allergic rhinitis is an allergic reaction that affects the mucous membrane inside the nose. It causes sneezing, a runny or stuffy nose, and the feeling of mucus going down the back of the throat (postnasal drip). Allergic rhinitis can be mild to severe. There are two types of allergic rhinitis:  Seasonal. This type is also called hay fever. It happens only during certain seasons.  Perennial. This type can happen at any time of the year.  What are the causes? This condition happens when the body's defense system (immune system) responds to certain harmless substances called allergens as though they were germs.  Seasonal allergic rhinitis is triggered by pollen, which can come from grasses, trees, and weeds. Perennial allergic rhinitis may be caused by:  House dust mites.  Pet dander.  Mold spores.  What are the signs or symptoms? Symptoms of this condition include:  Sneezing.  Runny or stuffy nose (nasal congestion).  Postnasal drip.  Itchy nose.  Tearing of the eyes.  Trouble sleeping.  Daytime sleepiness.  How is this diagnosed? This condition may be diagnosed based on:  Your medical history.  A physical exam.  Tests to check for related conditions, such as: ? Asthma. ? Pink eye. ? Ear infection. ? Upper respiratory infection.  Tests to find out which allergens trigger your symptoms. These may include skin or blood tests.  How is this treated? There is no cure for this condition, but treatment can help control symptoms. Treatment may include:  Taking medicines that block allergy symptoms, such as antihistamines. Medicine may be given as a shot, nasal spray, or pill.  Avoiding the allergen.  Desensitization. This treatment involves getting ongoing shots until your body becomes less sensitive to the allergen. This treatment may be done if other treatments do not help.  If  taking medicine and avoiding the allergen does not work, new, stronger medicines may be prescribed.  Follow these instructions at home:  Find out what you are allergic to. Common allergens include smoke, dust, and pollen.  Avoid the things you are allergic to. These are some things you can do to help avoid allergens: ? Replace carpet with wood, tile, or vinyl flooring. Carpet can trap dander and dust. ? Do not smoke. Do not allow smoking in your home. ? Change your heating and air conditioning filter at least once a month. ? During allergy season:  Keep windows closed as much as possible.  Plan outdoor activities when pollen counts are lowest. This is usually during the evening hours.  When coming indoors, change clothing and shower before sitting on furniture or bedding.  Take over-the-counter and prescription medicines only as told by your health care provider.  Keep all follow-up visits as told by your health care provider. This is important. Contact a health care provider if:  You have a fever.  You develop a persistent cough.  You make whistling sounds when you breathe (you wheeze).  Your symptoms interfere with your normal daily activities. Get help right away if:  You have shortness of breath. Summary  This condition can be managed by taking medicines as directed and avoiding allergens.  Contact your health care provider if you develop a persistent cough or fever.  During allergy season, keep windows closed as much as possible. This information is not intended to replace advice given to you by your health care provider. Make sure you discuss any questions you  have with your health care provider. Document Released: 06/01/2001 Document Revised: 10/14/2016 Document Reviewed: 10/14/2016 Elsevier Interactive Patient Education  [employer].

## 2018-05-14 NOTE — Progress Notes (Deleted)
Cardiology Office Note Date:  05/14/2018  Patient ID:  Penny, Kim 1970-11-01, MRN [id number] PCP:  Trinna Post, PA-C  Cardiologist:  Dr. Saunders Revel, MD  ***refresh   Chief Complaint: Follow up  History of Present Illness: Penny Kim is a 47 y.o. female with history of orthostatic hypotension with syncope, HTN, right-sided breast cancer s/p bilateral mastectomies with reconstruction and chemotherapy (doxorubicin/cyclophosphamide, paclitaxel)in 09/2010, anxiety, and depression who presents for follow up of hypertension.   Previously seen by Dr. Yvone Neu x 1 on 08/18/2016 for evaluation of syncope in 05/2016 that dated back to 2012. Episodes described as lightheadedness associated with positional changes and following using the restroom. She was noted to be orthostatic in the office on 08/18/2016. At that time, her HCTZ was stopped and her amlodipine was decreased to 2.5 mg daily.  Echocardiogram was ordered, which was not completed until 10/2016 that showed EF 50-55%, no RWMA, normal LV diastolic function, left atrium normal, RV systolic function normal, PASP normal. Nuclear stress test was also ordered and not completed until 11/19/2016 that showed a small defect of mild severity present in the apex location felt to be secondary to breast attenuation. EF 55-65%. Normal study. She was most recently seen in the office in 08/2017 for medication refills. BP was noted to be running in the 163W systolic. She denied any episodes of syncope. She was under increased amounts of stress. Her Norvasc was increased to 5 mg daily and HCTZ was restarted on 12.5 mg daily. Follow up RN visit was advised in 1 week, though not completed. She was seen by her PCP on 05/12/2018 for follow up of a sinus infection. BP was noted to be 150/100 at that time. She reported continued use of Afrin. She was advised to discontinue Afrin as it was felt this was playing a role in her BP.    ***  Past Medical History:  Diagnosis  Date  . Allergy   . Anxiety   . Breast cancer, right breast ([place]) 02/2010   s/p chemo (completed 09/2010) and right mastectomy w/reconstruction  . Depression   . History of stress test    a. treadmill Myoview 11/2016: small defect of mild severity present in the apex location felt to be secondary to breast attenuation. EF 55-65%. Normal study  . HTN (hypertension)   . Orthostatic hypotension   . Systolic dysfunction    a. TTE 10/2016: EF 50-55%, no RWMA, normal LV diastolic function, left atrium normal, RV systolic function normal, PASP normal    Past Surgical History:  Procedure Laterality Date  . BREAST RECONSTRUCTION  01/06/2012   Procedure: BREAST RECONSTRUCTION;  Surgeon: Crissie Reese, MD;  Location: [facility];  Service: Plastics;  Laterality: Bilateral;  bilateral removal of tissue expanders, bilateral placement of implants--Breast  . BREAST SURGERY  06/19/2010   exploration of right mastectomy site due to post-op bleeding  . INSERTION OF TISSUE EXPANDER AFTER MASTECTOMY  06/19/2010   bilat. mastectomies with bilat. tissue expanders  . PORT-A-CATH REMOVAL  11/26/2010  . PORTACATH PLACEMENT  07/23/2010  . TISSUE EXPANDER REMOVAL  10/20/2010   left    No outpatient medications have been marked as taking for the 05/15/18 encounter (Appointment) with Rise Mu, PA-C.    Allergies:   Prochlorperazine; Methadone hcl; Other; and Tape   Social History:  The patient  reports that she has never smoked. She has never used smokeless tobacco. She reports that she does not drink alcohol or  use drugs.   Family History:  The patient's family history includes Alcohol abuse in her father; Cancer in her maternal grandfather; Cancer (age of onset: 81) in her sister; Depression in her father and mother; Diabetes in her maternal grandmother and paternal grandmother; Drug abuse (age of onset: 78) in her father; Heart attack in her mother; Hyperlipidemia in her mother and paternal  grandmother; Hypertension in her maternal grandfather, maternal grandmother, mother, and paternal grandmother; Kidney disease in her paternal grandmother.  ROS:   ROS   PHYSICAL EXAM: *** VS:  There were no vitals taken for this visit. BMI: There is no height or weight on file to calculate BMI.  Physical Exam   EKG:  Was ordered and interpreted by me today. Shows ***  Recent Labs: 09/01/2017: BUN 6; Creatinine, Ser 0.80; Hemoglobin 12.5; Magnesium 1.8; Platelets 273; Potassium 4.5; Sodium 139; TSH 2.780  09/01/2017: Cholesterol, Total 203; HDL 73; LDL Calculated 112; LDL Direct 124; Triglycerides 90   CrCl cannot be calculated (Patient's most recent lab result is older than the maximum 21 days allowed.).   Wt Readings from Last 3 Encounters:  05/12/18 134 lb (60.8 kg)  05/03/18 136 lb 3.2 oz (61.8 kg)  02/23/18 141 lb (64 kg)     Other studies reviewed: Additional studies/records reviewed today include: summarized above  ASSESSMENT AND PLAN:  1. ***  Disposition: F/u with Dr. Saunders Revel or an APP in ***  Current medicines are reviewed at length with the patient today.  The patient did not have any concerns regarding medicines.  Signed, Christell Faith, PA-C 05/14/2018 3:22 PM     [facility] [address] [place], [place] [address] [phone]

## 2018-05-15 ENCOUNTER — Ambulatory Visit: Payer: BLUE CROSS/BLUE SHIELD | Admitting: Physician Assistant

## 2018-06-01 ENCOUNTER — Inpatient Hospital Stay: Payer: BLUE CROSS/BLUE SHIELD

## 2018-06-01 ENCOUNTER — Inpatient Hospital Stay: Payer: BLUE CROSS/BLUE SHIELD | Admitting: Genetics

## 2018-06-06 ENCOUNTER — Encounter: Payer: Self-pay | Admitting: Physician Assistant

## 2018-06-09 ENCOUNTER — Telehealth: Payer: Self-pay

## 2018-06-09 NOTE — Telephone Encounter (Signed)
Opened in error

## 2018-06-09 NOTE — Telephone Encounter (Signed)
Please advise patient that Oley Balm is out of the office until 9-27. Is there a particular medication that she cannot get? It looks like most of her medications are generic

## 2018-06-27 ENCOUNTER — Inpatient Hospital Stay: Payer: BLUE CROSS/BLUE SHIELD

## 2018-06-27 ENCOUNTER — Encounter: Payer: Self-pay | Admitting: Genetics

## 2018-06-27 ENCOUNTER — Encounter

## 2018-06-27 ENCOUNTER — Encounter: Payer: Self-pay | Admitting: Physician Assistant

## 2018-06-27 ENCOUNTER — Inpatient Hospital Stay: Payer: BLUE CROSS/BLUE SHIELD | Attending: Oncology | Admitting: Genetics

## 2018-06-27 ENCOUNTER — Ambulatory Visit (INDEPENDENT_AMBULATORY_CARE_PROVIDER_SITE_OTHER): Payer: BLUE CROSS/BLUE SHIELD | Admitting: Physician Assistant

## 2018-06-27 VITALS — BP 120/60 | HR 90 | Ht 64.5 in | Wt 128.0 lb

## 2018-06-27 DIAGNOSIS — Z803 Family history of malignant neoplasm of breast: Secondary | ICD-10-CM | POA: Diagnosis not present

## 2018-06-27 DIAGNOSIS — C50211 Malignant neoplasm of upper-inner quadrant of right female breast: Secondary | ICD-10-CM | POA: Diagnosis not present

## 2018-06-27 DIAGNOSIS — R55 Syncope and collapse: Secondary | ICD-10-CM | POA: Diagnosis not present

## 2018-06-27 DIAGNOSIS — Z801 Family history of malignant neoplasm of trachea, bronchus and lung: Secondary | ICD-10-CM | POA: Diagnosis not present

## 2018-06-27 DIAGNOSIS — Z170 Estrogen receptor positive status [ER+]: Secondary | ICD-10-CM

## 2018-06-27 DIAGNOSIS — F419 Anxiety disorder, unspecified: Secondary | ICD-10-CM

## 2018-06-27 DIAGNOSIS — Z8041 Family history of malignant neoplasm of ovary: Secondary | ICD-10-CM | POA: Insufficient documentation

## 2018-06-27 DIAGNOSIS — I10 Essential (primary) hypertension: Secondary | ICD-10-CM

## 2018-06-27 DIAGNOSIS — T7840XS Allergy, unspecified, sequela: Secondary | ICD-10-CM

## 2018-06-27 DIAGNOSIS — I951 Orthostatic hypotension: Secondary | ICD-10-CM

## 2018-06-27 MED ORDER — HYDROCHLOROTHIAZIDE 12.5 MG PO CAPS: 12.5000 mg | ORAL_CAPSULE | Freq: Every day | ORAL | 3 refills | Status: DC

## 2018-06-27 MED ORDER — AMLODIPINE BESYLATE 5 MG PO TABS: 5.0000 mg | ORAL_TABLET | Freq: Every day | ORAL | 3 refills | Status: DC

## 2018-06-27 NOTE — Patient Instructions (Signed)
Medication Instructions:  Your physician recommends that you continue on your current medications as directed. Please refer to the Current Medication list given to you today.  If you need a refill on your cardiac medications before your next appointment, please call your pharmacy.   Lab work: Your physician recommends that you return for lab work in: [facility].  If you have labs (blood work) drawn today and your tests are completely normal, you will receive your results only by: Marland Kitchen MyChart Message (if you have MyChart) OR . A paper copy in the mail If you have any lab test that is abnormal or we need to change your treatment, we will call you to review the results.  Testing/Procedures: none  Follow-Up: At [facility], you and your health needs are our priority.  As part of our continuing mission to provide you with exceptional heart care, we have created designated Provider Care Teams.  These Care Teams include your primary Cardiologist (physician) and Advanced Practice Providers (APPs -  Physician Assistants and Nurse Practitioners) who all work together to provide you with the care you need, when you need it. You will need a follow up appointment in 12 months.  Please call our office 12 months in advance to schedule this appointment.  You may see Dr Harrell Gave End or one of the following Advanced Practice Providers on your designated Care Team:   Murray Hodgkins, NP Christell Faith, PA-C . Marrianne Mood, PA-C
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
## 2018-06-27 NOTE — Progress Notes (Signed)
REFERRING PROVIDER: Chauncey Cruel, MD [address] [place], [place] [address]  PRIMARY PROVIDER:  Trinna Post, PA-C  PRIMARY REASON FOR VISIT:  1. Malignant neoplasm of upper-inner quadrant of right female breast, unspecified estrogen receptor status ([place])   2. Family history of breast cancer   3. Family history of lung cancer   4. Family history of ovarian cancer   5. Malignant neoplasm of upper-inner quadrant of right breast in female, estrogen receptor positive ([place])     HISTORY OF PRESENT ILLNESS:   Penny Kim, a 47 y.o. female, was seen for a [facility] cancer genetics consultation at the request of Dr. Jana Hakim due to a personal and family history of cancer.  Penny Kim presents to clinic today to discuss the possibility of a hereditary predisposition to cancer, genetic testing, and to further clarify her future cancer risks, as well as potential cancer risks for family members.   In 2011, at the age of 73, Penny Kim was diagnosed with Invasive ductal carcinoma of the right breast.  She underwent right mastectomy followed by chemotherapy and antiestrogen therapy.  She had genetic testing in 2011 for BRCA1 and 2 only.  A BRCA2 VUS was identified that was later downgraded to a likely benign variant in 2015. She is here to discuss updated genetic testing for a panel of cancer risk genes.    HORMONAL RISK FACTORS:  Menarche was at age 84.  First live birth at age N/A.  Ovaries intact: yes.  Hysterectomy: no.  Menopausal status: reports she is starting to have irregular bleeding, unk if she may be starting menopause.  Colonoscopy: no; not examined.  Past Medical History:  Diagnosis Date  . Allergy   . Anxiety   . Breast cancer, right breast ([place]) 02/2010   s/p chemo (completed 09/2010) and right mastectomy w/reconstruction  . Depression   . Family history of breast cancer   . Family history of lung cancer   . Family history of ovarian cancer   . History of  stress test    a. treadmill Myoview 11/2016: small defect of mild severity present in the apex location felt to be secondary to breast attenuation. EF 55-65%. Normal study  . HTN (hypertension)   . Orthostatic hypotension   . Systolic dysfunction    a. TTE 10/2016: EF 50-55%, no RWMA, normal LV diastolic function, left atrium normal, RV systolic function normal, PASP normal    Past Surgical History:  Procedure Laterality Date  . BREAST RECONSTRUCTION  01/06/2012   Procedure: BREAST RECONSTRUCTION;  Surgeon: Crissie Reese, MD;  Location: [facility];  Service: Plastics;  Laterality: Bilateral;  bilateral removal of tissue expanders, bilateral placement of implants--Breast  . BREAST SURGERY  06/19/2010   exploration of right mastectomy site due to post-op bleeding  . INSERTION OF TISSUE EXPANDER AFTER MASTECTOMY  06/19/2010   bilat. mastectomies with bilat. tissue expanders  . PORT-A-CATH REMOVAL  11/26/2010  . PORTACATH PLACEMENT  07/23/2010  . TISSUE EXPANDER REMOVAL  10/20/2010   left    Social History   Socioeconomic History  . Marital status: Single    Spouse name: Not on file  . Number of children: Not on file  . Years of education: 68  . Highest education level: Not on file  Occupational History  . Occupation: Land    Comment: [place] [employer]  . Financial resource strain: Not on file  . Food insecurity:  Worry: Not on file    Inability: Not on file  . Transportation needs:    Medical: Not on file    Non-medical: Not on file  Tobacco Use  . Smoking status: Never Smoker  . Smokeless tobacco: Never Used  Substance and Sexual Activity  . Alcohol use: No    Frequency: Never    Comment: occasionally  . Drug use: No  . Sexual activity: Not Currently    Birth control/protection: None  Lifestyle  . Physical activity:    Days per week: Not on file    Minutes per session: Not on file  . Stress: Not on  file  Relationships  . Social connections:    Talks on phone: Not on file    Gets together: Not on file    Attends religious service: Not on file    Active member of club or organization: Not on file    Attends meetings of clubs or organizations: Not on file    Relationship status: Not on file  Other Topics Concern  . Not on file  Social History Narrative   [name] [place], [place]. She attended [employer] where she obtained her Bachelors of Science degree in Psychology. 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 She currently lives in [place]. She lives alone. [name] enjoys attending live musical performances. 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      FAMILY HISTORY:  We obtained a detailed, 4-generation family history.  Significant diagnoses are listed below: Family History  Problem Relation Age of Onset  . Hypertension Mother   . Depression Mother   . Heart attack Mother   . Hyperlipidemia Mother   . Cancer Sister 30       Ovarian cancer  . Hypertension Maternal Grandmother   . Diabetes Maternal Grandmother   . 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 Hypertension Maternal Grandfather   . Diabetes Paternal Grandmother   . Hypertension Paternal Grandmother   . Kidney disease Paternal Grandmother        End stage renal dz - dialysis  . Hyperlipidemia Paternal Grandmother   . Breast cancer Paternal Grandmother   . Alcohol abuse Father   . Depression Father   . Drug abuse Father 62       Died of drug overdose    Penny Kim has no children.  She has a sister who is 4 and has a history of ovarian cancer dx at 65.  She has 2 children.   [name] father: died at 25, no hx of cancer. Paternal Aunts/Uncles: 3 paternal aunts, 5 paternal uncles- limited information available about these relatives Paternal cousins: limited info, no known  cancer Paternal grandfather: unk Paternal grandmother:kidney disease, had a 'cancerous breast tumor' removed  [name]'s mother: died at 109, no hx of cancer Maternal Aunts/Uncles: 2 maternal uncles, 6 maternal aunts.  There relatives are very private and she does not know much about their health  Maternal cousins: limited info Maternal grandfather: died of lung cancer, hx of smoking, mill worker Maternal grandmother:no cancer.  This grandmother's sister had a breast cyst removed.   Ms. Provencio is unaware of previous family history of genetic testing for hereditary cancer risks. Patient's ancestors are of [place], Native [place], [place] American descent. There is no reported Ashkenazi Jewish ancestry. There is no  known consanguinity.  GENETIC COUNSELING ASSESSMENT: TAYGEN NEWSOME is a 47 y.o. female with a personal history which is somewhat suggestive of a Hereditary Cancer Predisposition Syndrome. We, therefore, discussed and recommended the following at today's visit.   DISCUSSION: We reviewed the characteristics, features and inheritance patterns of hereditary cancer syndromes. We also discussed genetic testing, including the appropriate family members to test, the process of testing, insurance coverage and turn-around-time for results. We discussed the implications of a negative, positive and/or variant of uncertain significant result. We recommended Ms. Zenia Resides pursue genetic testing for the [facility] gene panel.   The [facility] gene panel offered by [employer] includes sequencing and deletion/duplication testing of the following 35 genes: APC, ATM, BARD1, BMPR1A, BRCA1, BRCA2, BRIP1, CHD1, CDK4, CDKN2A, CHEK2, EPCAM (large rearrangement only), GREM1, HOXB13, AXIN2, MSH3, NTHL1, RNF43, GALNT12, RSP20, MLH1, MSH2, MSH6, MUTYH, NBN, PALB2, PMS2, PTEN, RAD51C, RAD51D, SMAD4, STK11, and TP53. Sequencing was performed for select regions of POLE and POLD1, and large rearrangement  analysis was performed for select regions of GREM1.  We discussed that only 5-10% of cancers are associated with a Hereditary cancer predisposition syndrome.  One of the most common hereditary cancer syndromes that increases breast cancer risk is called Hereditary Breast and Ovarian Cancer (HBOC) syndrome.  This syndrome is caused by mutations in the BRCA1 and BRCA2 genes.  This syndrome increases an individual's lifetime risk to develop breast, ovarian, pancreatic, and other types of cancer.  She was tested for these genes in the past, so there is a low chance to find a mutation in one of these genes.  However, there are also many other cancer predisposition syndromes caused by mutations in several other genes. (example: ATM, PALB2, CHEK2, etc).    We discussed that if she is found to have a mutation in one of these genes, it may impact future medical management recommendations such as increased cancer screenings and consideration of risk reducing surgeries.  A positive result could also have implications for the patient's family members.  A Negative result would mean we were unable to identify a hereditary component to her cancer, but does not rule out the possibility of a hereditary basis for her cancer.  There could be mutations that are undetectable by current technology, or in genes not yet tested or identified to increase cancer risk.    We discussed the potential to find a Variant of Uncertain Significance or VUS.  These are variants that have not yet been identified as pathogenic or benign, and it is unknown if this variant is associated with increased cancer risk or if this is a normal finding.  Most VUS's are reclassified to benign or likely benign.   It should not be used to make medical management decisions. With time, we suspect the lab will determine the significance of any VUS's identified if any.   Based on Ms. Nastasi's personal and family history of cancer, she meets medical criteria for  genetic testing. Despite that she meets criteria, she may still have an out of pocket cost. The laboratory can provide her with an estimate of her OOP cost.  she was given the contact information for the laboratory if she has further questions. Marland Kitchen    PLAN: After considering the risks, benefits, and limitations, Ms. Zenia Resides  provided informed consent to pursue genetic testing and the blood sample was sent to [facility] for analysis of the [facility]. Results should be available within approximately 2-3 weeks' time, at which point they  will be disclosed by telephone to Ms. Zenia Resides, as will any additional recommendations warranted by these results. Ms. Pacifico will receive a summary of her genetic counseling visit and a copy of her results once available. This information will also be available in Epic. We encouraged Ms. Migliaccio to remain in contact with cancer genetics annually so that we can continuously update the family history and inform her of any changes in cancer genetics and testing that may be of benefit for her family. Ms. Finks questions were answered to her satisfaction today. Our contact information was provided should additional questions or concerns arise.  Based on Ms. Stanczyk's family history, we recommended her sister, who was diagnosed with ovarian cancer at age 27, have genetic counseling and testing. Ms. Amburn will let us know if we can be of any assistance in coordinating genetic counseling and/or testing for this family member.   Lastly, we encouraged Ms. Borgerding to remain in contact with cancer genetics annually so that we can continuously update the family history and inform her of any changes in cancer genetics and testing that may be of benefit for this family.   Ms.  Freestone questions were answered to her satisfaction today. Our contact information was provided should additional questions or concerns arise. Thank you for the referral and allowing [place] to share in the care of your patient.    Tana Felts, MS, [facility] Certified Genetic Counselor [email or web address] phone: [phone]  The patient was seen for a total of 35 minutes in face-to-face genetic counseling. This patient was discussed with Drs. Magrinat, Lindi Adie and/or Burr Medico who agrees with the above.

## 2018-06-28 LAB — BASIC METABOLIC PANEL
BUN/Creatinine Ratio: 7 — ABNORMAL LOW (ref 9–23)
BUN: 7 mg/dL (ref 6–24)
CO2: 26 mmol/L (ref 20–29)
Calcium: 9.8 mg/dL (ref 8.7–10.2)
Chloride: 97 mmol/L (ref 96–106)
Creatinine, Ser: 0.94 mg/dL (ref 0.57–1.00)
GFR calc Af Amer: 84 mL/min/{1.73_m2} (ref >59)
GFR, EST NON AFRICAN AMERICAN: 72 mL/min/{1.73_m2} (ref >59)
Glucose: 105 mg/dL — ABNORMAL HIGH (ref 65–99)
POTASSIUM: 4 mmol/L (ref 3.5–5.2)
SODIUM: 139 mmol/L (ref 134–144)

## 2018-07-10 ENCOUNTER — Telehealth: Payer: Self-pay | Admitting: Genetics

## 2018-07-26 NOTE — Telephone Encounter (Signed)
Printed avs an calender of upcoming appointment

## 2018-08-11 ENCOUNTER — Telehealth: Payer: Self-pay | Admitting: Genetics

## 2018-09-25 ENCOUNTER — Encounter: Payer: Self-pay | Admitting: Genetics

## 2018-09-25 NOTE — Telephone Encounter (Signed)
Asked pt to call back to go over genetic test results.

## 2018-10-10 ENCOUNTER — Telehealth: Payer: Self-pay | Admitting: Genetics

## 2018-10-10 NOTE — Telephone Encounter (Signed)
Patient called to get genetic test results.   Revealed negative genetic testing.   This normal result is reassuring and indicates that it is unlikely Penny Kim's cancer is due to a hereditary cause.  It is unlikely that there is an increased risk of another cancer due to a mutation in one of these genes.  However, genetic testing is not perfect, and cannot definitively rule out a hereditary cause.  It will be important for her to keep in contact with genetics to learn if any additional testing may be needed in the future.     We recommended she continue to follow any recommendations for cancer screening.management give to her by her providers.  We also recommended her sister consider also having genetics due to her hx of ovarian cancer.

## 2018-10-16 ENCOUNTER — Ambulatory Visit: Payer: Self-pay | Admitting: Genetics

## 2018-10-16 ENCOUNTER — Encounter: Payer: Self-pay | Admitting: Genetics

## 2018-10-16 DIAGNOSIS — Z803 Family history of malignant neoplasm of breast: Secondary | ICD-10-CM

## 2018-10-16 DIAGNOSIS — Z1379 Encounter for other screening for genetic and chromosomal anomalies: Secondary | ICD-10-CM

## 2018-10-16 DIAGNOSIS — Z8041 Family history of malignant neoplasm of ovary: Secondary | ICD-10-CM

## 2018-10-16 DIAGNOSIS — Z801 Family history of malignant neoplasm of trachea, bronchus and lung: Secondary | ICD-10-CM

## 2018-10-16 DIAGNOSIS — C50211 Malignant neoplasm of upper-inner quadrant of right female breast: Secondary | ICD-10-CM

## 2018-10-16 DIAGNOSIS — Z170 Estrogen receptor positive status [ER+]: Secondary | ICD-10-CM

## 2018-10-16 NOTE — Progress Notes (Signed)
HPI:  Penny Kim was previously seen in the [facility] on 06/27/2018 due to a personal and family history of cancer and concerns regarding a hereditary predisposition to cancer. Please refer to our prior cancer genetics clinic note for more information regarding Penny Kim's medical, social and family histories, and our assessment and recommendations, at the time. Penny Kim recent genetic test results were disclosed to her, as well as recommendations warranted by these results. These results and recommendations are discussed in more detail below.  CANCER HISTORY:   No history exists.     FAMILY HISTORY:  We obtained a detailed, 4-generation family history.  Significant diagnoses are listed below: Family History  Problem Relation Age of Onset  . Hypertension Mother   . Depression Mother   . Heart attack Mother   . Hyperlipidemia Mother   . Cancer Sister 30       Ovarian cancer  . Hypertension Maternal Grandmother   . Diabetes Maternal Grandmother   . 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 Hypertension Maternal Grandfather   . Diabetes Paternal Grandmother   . Hypertension Paternal Grandmother   . Kidney disease Paternal Grandmother        End stage renal dz - dialysis  . Hyperlipidemia Paternal Grandmother   . Breast cancer Paternal Grandmother   . Alcohol abuse Father   . Depression Father   . Drug abuse Father 79       Died of drug overdose    Penny Kim has no children.  She has a sister who is 4 and has a history of ovarian cancer dx at 45.  She has 2 children.   Penny Kim father: died at 98, no hx of cancer. Paternal Aunts/Uncles: 3 paternal aunts, 5 paternal uncles- limited information available about these relatives Paternal cousins: limited info, no known cancer Paternal grandfather: unk Paternal grandmother:kidney disease, had a 'cancerous breast tumor' removed  Ms. Adsit's mother: died at 66, no hx of  cancer Maternal Aunts/Uncles: 2 maternal uncles, 6 maternal aunts.  There relatives are very private and she does not know much about their health  Maternal cousins: limited info Maternal grandfather: died of lung cancer, hx of smoking, mill worker Maternal grandmother:no cancer.  This grandmother's sister had a breast cyst removed.   Penny Kim is unaware of previous family history of genetic testing for hereditary cancer risks. Patient's ancestors are of [place], Native [place], [place] American descent. There is no reported Ashkenazi Jewish ancestry. There is no known consanguinity.  GENETIC TEST RESULTS: Genetic testing performed through [facility] Panel reported out on 07/05/2018 showed no pathogenic mutations.  The [facility] gene panel offered by [employer] includes sequencing and deletion/duplication testing of the following 35 genes: APC, ATM, BARD1, BMPR1A, BRCA1, BRCA2, BRIP1, CHD1, CDK4, CDKN2A, CHEK2, EPCAM (large rearrangement only), GREM1, HOXB13, AXIN2, MSH3, NTHL1, RNF43, GALNT12, RSP20, MLH1, MSH2, MSH6, MUTYH, NBN, PALB2, PMS2, PTEN, RAD51C, RAD51D, SMAD4, STK11, and TP53. Sequencing was performed for select regions of POLE and POLD1, and large rearrangement analysis was performed for select regions of GREM1.   The test report will be scanned into EPIC and will be located under the Molecular Pathology section of the Results Review tab. A portion of the result report is included below for reference.     We discussed with Penny Kim that because current genetic testing is not perfect, it is possible there may  be a gene mutation in one of these genes that current testing cannot detect, but that chance is small.  We also discussed, that there could be another gene that has not yet been discovered, or that we have not yet tested, that is responsible for the cancer diagnoses in the family. It is also possible there is a hereditary cause for the cancer in the  family that Penny Kim did not inherit and therefore was not identified in her testing.  Therefore, it is important to remain in touch with cancer genetics in the future so that we can continue to offer Penny Kim the most up to date genetic testing.   CANCER SCREENING RECOMMENDATIONS: Penny Kim test result is negative (normal).  This means that we have not identified a hereditary cause for personal and family history of cancer at this time.   This does not definitively rule out a hereditary predisposition to cancer. It is still possible that there could be genetic mutations that are undetectable by current technology, or genetic mutations in genes that have not been tested or identified to increase cancer risk.  Therefore, it is recommended she continue to follow the cancer management and screening guidelines provided by her oncology and primary healthcare provider. An individual's cancer risk is not determined by genetic test results alone.  Overall cancer risk assessment includes additional factors such as personal medical history, family history, etc.  These should be used to make a personalized plan for cancer prevention and surveillance.    RECOMMENDATIONS FOR FAMILY MEMBERS:  Relatives in this family might be at some increased risk of developing cancer, over the general population risk, simply due to the family history of cancer.  We recommended women in this family have a yearly mammogram beginning at age 17, or 63 years younger than the earliest onset of cancer, an annual clinical breast exam, and perform monthly breast self-exams. Women in this family should also have a gynecological exam as recommended by their primary provider. All family members should have a colonoscopy by age 76 (or as directed by their doctors).  All family members should inform their physicians about the family history of cancer so their doctors can make the most appropriate screening recommendations for them.   It is also  possible there is a hereditary cause for the cancer in Penny Kim's family that she did not inherit and therefore was not identified in her.  Therefore, we recommended hers sister with ovarian cancer also have genetic counseling and testing. Penny Kim will let us know if we can be of any assistance in coordinating genetic counseling and/or testing for these family members.   FOLLOW-UP: Lastly, we discussed with Penny Kim that cancer genetics is a rapidly advancing field and it is possible that new genetic tests will be appropriate for her and/or her family members in the future. We encouraged her to remain in contact with cancer genetics on an annual basis so we can update her personal and family histories and let her know of advances in cancer genetics that may benefit this family.   Our contact number was provided. Penny Kim questions were answered to her satisfaction, and she knows she is welcome to call us at anytime with additional questions or concerns.   Ferol Luz, MS, [facility] Certified Genetic Counselor [email or web address]

## 2018-10-25 ENCOUNTER — Ambulatory Visit (INDEPENDENT_AMBULATORY_CARE_PROVIDER_SITE_OTHER): Payer: BLUE CROSS/BLUE SHIELD | Admitting: Physician Assistant

## 2018-10-25 ENCOUNTER — Encounter: Payer: Self-pay | Admitting: Physician Assistant

## 2018-10-25 VITALS — BP 117/78 | HR 73 | Temp 97.8°F | Resp 16 | Wt 136.8 lb

## 2018-10-25 DIAGNOSIS — H938X3 Other specified disorders of ear, bilateral: Secondary | ICD-10-CM

## 2018-10-25 DIAGNOSIS — J309 Allergic rhinitis, unspecified: Secondary | ICD-10-CM | POA: Diagnosis not present

## 2018-10-25 DIAGNOSIS — Z23 Encounter for immunization: Secondary | ICD-10-CM | POA: Diagnosis not present

## 2018-10-25 NOTE — Progress Notes (Signed)
Patient: Penny Kim Female    DOB: September 09, 1971   48 y.o.   MRN: [id number] Visit Date: 10/25/2018  Today's Provider: Trinna Post, PA-C   Chief Complaint  Patient presents with  . Ear Pain   Subjective:     HPI Upper Respiratory Infection: Patient complains of symptoms of a URI. Symptoms include bilateral ear pain and cough. Onset of symptoms was 1 week ago, unchanged since that time. She also c/o bilateral ear pressure/pain for the past 1 week .  She is drinking plenty of fluids. Evaluation to date: none. Treatment to date: nasal steroids. Reports many years prior she had wax obstructing her ears and reports this feels similarly. Says she can't hear out of them. Has been using debrox wax softening drops.   She continues to follow up with oncologist regarding breat cancer treatment and follow up.  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 She is currently walking with the use of a cane.     Allergies  Allergen Reactions  . Prochlorperazine Other (See Comments)    SYNCOPE SYNCOPE  . Methadone Hcl Itching  . Other   . Tape Rash     Current Outpatient Medications:  .  amLODipine (NORVASC) 5 MG tablet, Take 1 tablet (5 mg total) by mouth daily., Disp: 90 tablet, Rfl: 3 .  clobetasol ointment (TEMOVATE) 2.99 %, Apply 1 application topically 2 (two) times daily., Disp: 60 g, Rfl: 1 .  clonazePAM (KLONOPIN) 1 MG tablet, Take 1 mg by mouth 4 (four) times daily., Disp: , Rfl: 3 .  fluticasone (FLONASE) 50 MCG/ACT nasal spray, Place 2 sprays into both nostrils daily., Disp: 16 g, Rfl: 6 .  gabapentin (NEURONTIN) 300 MG capsule, TAKE 1 CAPSULE BY MOUTH FOUR TIMES A DAY, Disp: , Rfl: 4 .  hydrochlorothiazide (MICROZIDE) 12.5 MG capsule, Take 1 capsule (12.5 mg total) by mouth daily., Disp: 90 capsule, Rfl: 3 .  tamoxifen (NOLVADEX) 20 MG tablet, Take 1 tablet (20 mg total) by mouth daily., Disp: 90 tablet, Rfl: 4 .  vortioxetine HBr  (TRINTELLIX) 20 MG TABS tablet, Take 20 mg by mouth daily., Disp: , Rfl:   Review of Systems  HENT: Positive for ear pain and rhinorrhea.   Respiratory: Positive for cough.     Social History   Tobacco Use  . Smoking status: Never Smoker  . Smokeless tobacco: Never Used  Substance Use Topics  . Alcohol use: No    Frequency: Never    Comment: occasionally      Objective:   BP 117/78 (BP Location: Left Arm, Patient Position: Sitting, Cuff Size: Normal)   Pulse 73   Temp 97.8 F (36.6 C) (Oral)   Resp 16   Wt 136 lb 12.8 oz (62.1 kg)   SpO2 97%   BMI 23.12 kg/m  Vitals:   10/25/18 0859  BP: 117/78  Pulse: 73  Resp: 16  Temp: 97.8 F (36.6 C)  TempSrc: Oral  SpO2: 97%  Weight: 136 lb 12.8 oz (62.1 kg)     Physical Exam Constitutional:      Appearance: She is well-developed.     Comments: Right ear completely clear of wax. Left ear has a mild amount of wax that does not completely obstruct the TM.  HENT:     Right Ear: External ear normal.     Left Ear: External ear normal.     Mouth/Throat:     Pharynx:  No oropharyngeal exudate.  Eyes:     General:        Right eye: Discharge present.        Left eye: Discharge present. Neck:     Musculoskeletal: Neck supple.  Cardiovascular:     Rate and Rhythm: Normal rate and regular rhythm.  Pulmonary:     Effort: Pulmonary effort is normal. No respiratory distress.     Breath sounds: Normal breath sounds. No rales.  Lymphadenopathy:     Cervical: No cervical adenopathy.  Skin:    General: Skin is warm and dry.  Psychiatric:        Mood and Affect: Mood normal.        Behavior: Behavior normal.         Assessment & Plan    1. Sensation of fullness in both ears  Counseled on coricidin and taking allergy medication like xyzal. Should also use flonase. I do not see obstructing wax. If patient would like ENT referral if these interventions fail, then she may call back and we can place this for her.  2.  Allergic rhinitis, unspecified seasonality, unspecified trigger   3. Need for influenza vaccination  - Flu Vaccine QUAD 36+ mos IM  The entirety of the information documented in the History of Present Illness, Review of Systems and Physical Exam were personally obtained by me. Portions of this information were initially documented by Lynford Humphrey, CMA and reviewed by me for thoroughness and accuracy.   Return if symptoms worsen or fail to improve.     Trinna Post, PA-C  [facility]

## 2018-10-25 NOTE — Patient Instructions (Signed)
Flonase nasal spray 1 spray each nostril twice daily Allegra/zyrtec/clairitn or xyzal DAILY

## 2018-12-08 ENCOUNTER — Telehealth: Payer: Self-pay

## 2018-12-08 NOTE — Telephone Encounter (Signed)
Patient called to report lump to right breast X 2 days.  Patient denies redness, swelling.  Reports mild discomfort.    Double mastectomies and implants to bilateral breast.  Nurse reviewed with Dr. Jana Hakim - he recommends patient to come in for assessment by MD.    Nurse left voicemail for patient to return call.

## 2019-01-09 ENCOUNTER — Telehealth: Payer: Self-pay

## 2019-01-09 ENCOUNTER — Encounter: Payer: Self-pay | Admitting: Physician Assistant

## 2019-01-09 NOTE — Telephone Encounter (Signed)
Copied from [place] [id number]. Topic: General - Other >> Jan 09, 2019  3:30 PM Leward Quan A wrote: Reason for CRM: Patient called to ask Dr Hulan Saas can he please take a look at her MRI's and call to let her know if she need to have a total hip replacement now or can it wait until further down the road. She stated that she was told that this procedure is necessary right away but she would like a second opinion from Dr Tamala Julian. Please advise  Ph# [phone]

## 2019-01-09 NOTE — Telephone Encounter (Signed)
Called patient

## 2019-01-10 NOTE — Progress Notes (Signed)
Entered in error

## 2019-01-11 ENCOUNTER — Ambulatory Visit: Payer: BLUE CROSS/BLUE SHIELD | Admitting: Family Medicine

## 2019-01-14 ENCOUNTER — Encounter: Payer: Self-pay | Admitting: Family Medicine

## 2019-01-17 ENCOUNTER — Telehealth: Payer: Self-pay

## 2019-01-17 NOTE — Telephone Encounter (Signed)
Left message for patient to call back  

## 2019-01-29 ENCOUNTER — Telehealth: Payer: Self-pay | Admitting: Internal Medicine

## 2019-01-29 ENCOUNTER — Other Ambulatory Visit: Payer: Self-pay

## 2019-01-29 MED ORDER — CLOBETASOL PROPIONATE 0.05 % EX OINT: 1.0000 "application " | TOPICAL_OINTMENT | Freq: Two times a day (BID) | CUTANEOUS | 1 refills | Status: DC

## 2019-01-29 NOTE — Telephone Encounter (Signed)
Received forms from [employer] at [place] to be completed  Placed in [employer] and sent to [employer]

## 2019-01-30 DIAGNOSIS — F332 Major depressive disorder, recurrent severe without psychotic features: Secondary | ICD-10-CM

## 2019-01-30 DIAGNOSIS — F064 Anxiety disorder due to known physiological condition: Secondary | ICD-10-CM

## 2019-04-16 ENCOUNTER — Telehealth: Payer: Self-pay

## 2019-04-16 NOTE — Telephone Encounter (Signed)
Left message for patient to call back regarding message left this morning.

## 2019-05-02 ENCOUNTER — Inpatient Hospital Stay: Payer: BLUE CROSS/BLUE SHIELD

## 2019-05-02 ENCOUNTER — Inpatient Hospital Stay: Payer: BLUE CROSS/BLUE SHIELD | Admitting: Oncology

## 2019-05-10 ENCOUNTER — Telehealth: Payer: Self-pay | Admitting: Adult Health

## 2019-05-10 NOTE — Telephone Encounter (Signed)
I talk with patient regarding schedule  

## 2019-05-11 ENCOUNTER — Inpatient Hospital Stay: Payer: BLUE CROSS/BLUE SHIELD

## 2019-05-11 ENCOUNTER — Inpatient Hospital Stay: Payer: BLUE CROSS/BLUE SHIELD | Admitting: Adult Health

## 2019-05-21 ENCOUNTER — Other Ambulatory Visit: Payer: Self-pay | Admitting: Physician Assistant

## 2019-05-21 ENCOUNTER — Other Ambulatory Visit: Payer: Self-pay | Admitting: Oncology

## 2019-05-21 DIAGNOSIS — J301 Allergic rhinitis due to pollen: Secondary | ICD-10-CM

## 2019-05-24 DIAGNOSIS — M87852 Other osteonecrosis, left femur: Secondary | ICD-10-CM | POA: Diagnosis not present

## 2019-05-24 DIAGNOSIS — M545 Low back pain: Secondary | ICD-10-CM | POA: Diagnosis not present

## 2019-05-24 DIAGNOSIS — M87851 Other osteonecrosis, right femur: Secondary | ICD-10-CM | POA: Diagnosis not present

## 2019-06-01 ENCOUNTER — Encounter: Payer: Self-pay | Admitting: Adult Health

## 2019-06-01 ENCOUNTER — Encounter: Payer: Self-pay | Admitting: Physician Assistant

## 2019-06-04 ENCOUNTER — Encounter: Payer: Self-pay | Admitting: Physician Assistant

## 2019-06-04 NOTE — Telephone Encounter (Signed)
Please schedule her a telephone visit for me to fill this form out. I would need to ask her more questions.

## 2019-06-05 ENCOUNTER — Other Ambulatory Visit: Payer: Self-pay | Admitting: *Deleted

## 2019-06-05 ENCOUNTER — Ambulatory Visit (INDEPENDENT_AMBULATORY_CARE_PROVIDER_SITE_OTHER): Payer: BLUE CROSS/BLUE SHIELD | Admitting: Physician Assistant

## 2019-06-05 ENCOUNTER — Telehealth: Payer: Self-pay | Admitting: Physician Assistant

## 2019-06-05 ENCOUNTER — Other Ambulatory Visit: Payer: Self-pay

## 2019-06-05 DIAGNOSIS — M8700 Idiopathic aseptic necrosis of unspecified bone: Secondary | ICD-10-CM | POA: Diagnosis not present

## 2019-06-05 DIAGNOSIS — R6889 Other general symptoms and signs: Secondary | ICD-10-CM | POA: Diagnosis not present

## 2019-06-05 DIAGNOSIS — F32A Depression, unspecified: Secondary | ICD-10-CM

## 2019-06-05 DIAGNOSIS — F331 Major depressive disorder, recurrent, moderate: Secondary | ICD-10-CM

## 2019-06-05 DIAGNOSIS — Z20822 Contact with and (suspected) exposure to covid-19: Secondary | ICD-10-CM

## 2019-06-05 DIAGNOSIS — C50211 Malignant neoplasm of upper-inner quadrant of right female breast: Secondary | ICD-10-CM

## 2019-06-05 DIAGNOSIS — F329 Major depressive disorder, single episode, unspecified: Secondary | ICD-10-CM

## 2019-06-05 DIAGNOSIS — F419 Anxiety disorder, unspecified: Secondary | ICD-10-CM | POA: Diagnosis not present

## 2019-06-05 NOTE — Progress Notes (Signed)
Patient: Penny Kim Female    DOB: 1971-09-19   48 y.o.   MRN: [id number] Visit Date: 06/05/2019  Today's Provider: Trinna Post, PA-C   Chief Complaint  Patient presents with  . Form Completion   Subjective:    Virtual Visit via Telephone Note  I connected with [name] on 06/05/19 at  3:40 PM EDT by telephone and verified that I am speaking with the correct person using two identifiers.   I discussed the limitations, risks, security and privacy concerns of performing an evaluation and management service by telephone and the availability of in person appointments. I also discussed with the patient that there may be a patient responsible charge related to this service. The patient expressed understanding and agreed to proceed.  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 She reports she is in the process of applying for disability. She reports she received favorable decision for disability on 05/17/2019. Her case was found favorable but reports it has been sent to appeals courts. Patient reports doctors answers were vague and it is going to a new court. She needs other medical opinions on her functional status. Combination of both mental and physical disabilities are claimed. She is having her psychiatrist Dr. Toy Care fill out the psychiatric portion of the form.   She has been followed for bilateral AVN of her hips by [facility] for a couple of years. She has seen Dr. Mack Guise and Dr. Harlow Mares. Her most recent visit was on 05/24/2019 with Dr. Harlow Mares. At that visit she reported she recently fell, had severe pain in her hip and had stopped using her cane. In the physical exam It was noted she had limited hip ROM but full strength in  bilateral lower extremities. Xrays that day redemonstrated AVN. She was prescribed flexeril and physical therapy. Follow up was instructed to be on an as needed basis. Patient was reported to be agreeable to the plan.   On 10/27/2018 she was seen at [facility] for AVN. Patient reported doing well with home exercise routine. She was recommended to continue low impact exercise and return in one year.   On 04/18/2018 she was seen at [facility]. She reported persistent pain but was still carrying out ADLs. Surgical intervention was not recommended.   On 11/2017 she reported pain in bilateral hips during [facility] visit. She reported she needs help to stand after 3 hours. Instructed to follow up in 4 months to be re-evaluated for progression and candidacy for hip replacement.   She had some nasal congestion today and so got a COVID test this morning.   She is doing better with allergies since taking daily 2nd gen antihistamine and since stopping Afrin. She reports she is better able to smell.    Allergies  Allergen Reactions  . Prochlorperazine Other (See Comments)    SYNCOPE SYNCOPE  . Methadone Hcl Itching  . Other   . Tape Rash     Current Outpatient Medications:  .  amLODipine (NORVASC) 5 MG tablet, Take 1 tablet (5 mg total) by mouth daily., Disp: 90 tablet, Rfl: 3 .  clobetasol ointment (TEMOVATE) [id number] %, Apply 1 application topically 2 (two) times daily., Disp: 60 g, Rfl: 1 .  clonazePAM (KLONOPIN) 1 MG tablet, Take 1 mg by mouth 4 (four) times daily., Disp: , Rfl: 3 .  fluticasone (FLONASE) 50 MCG/ACT nasal spray, SPRAY 2 SPRAYS INTO EACH NOSTRIL EVERY DAY, Disp: 48 mL, Rfl: 0 .  gabapentin (NEURONTIN) 300 MG capsule, TAKE 1 CAPSULE BY MOUTH FOUR TIMES A DAY, Disp: , Rfl: 4 .  hydrochlorothiazide (MICROZIDE) 12.5 MG capsule, Take 1 capsule (12.5 mg total) by mouth daily., Disp: 90 capsule, Rfl: 3 .  tamoxifen (NOLVADEX) 20 MG tablet, TAKE 1 TABLET BY MOUTH EVERY DAY, Disp: 90  tablet, Rfl: 4 .  vortioxetine HBr (TRINTELLIX) 20 MG TABS tablet, Take 20 mg by mouth daily., Disp: , Rfl:   Review of Systems  Social History   Tobacco Use  . Smoking status: Never Smoker  . Smokeless tobacco: Never Used  Substance Use Topics  . Alcohol use: No    Frequency: Never    Comment: occasionally      Objective:   There were no vitals taken for this visit. There were no vitals filed for this visit.There is no height or weight on file to calculate BMI.   Physical Exam   No results found for any visits on 06/05/19.     Assessment & Plan    1. Avascular necrosis of bone ([place])   I will fill out her form regarding ability to work. I would like to touch base with provider at [facility] as she has avascular necrosis of both hips and has been evaluated for possible revascularization and/or hip arthroplasty but has not proceeded. Most recently, patient has not been candidate for surgery. Last visit on 05/24/2019 per [facility] note instructed patient to take Flexeril and follow up PRN. Note reports she was happy with the plan. b  2. Malignant neoplasm of upper-inner quadrant of right female breast, unspecified estrogen receptor status ([place])   3. Anxiety and depression  The entirety of the information documented in the History of Present Illness, Review of Systems and Physical Exam were personally obtained by me. Portions of this information were initially documented by Ashley Royalty, CMA and reviewed by me for thoroughness and accuracy.   F/u PRN  I have spent 25 minutes with this patient, >50% of which was spent on counseling and coordination of care.      Trinna Post, PA-C  [facility]

## 2019-06-05 NOTE — Telephone Encounter (Signed)
Can we call patient and have her come by to fill out ROI. I think it would be best for me to fax her forms directly to the attorney.

## 2019-06-05 NOTE — Telephone Encounter (Signed)
This patient sent me a picture of her foot. She needs to contact her podiatrist.

## 2019-06-06 NOTE — Telephone Encounter (Signed)
LMTCB

## 2019-06-07 ENCOUNTER — Telehealth: Payer: Self-pay | Admitting: Oncology

## 2019-06-07 ENCOUNTER — Inpatient Hospital Stay: Payer: BLUE CROSS/BLUE SHIELD

## 2019-06-07 ENCOUNTER — Inpatient Hospital Stay: Payer: BLUE CROSS/BLUE SHIELD | Admitting: Adult Health

## 2019-06-07 LAB — NOVEL CORONAVIRUS, NAA: SARS-CoV-2, NAA: NOT DETECTED

## 2019-06-07 NOTE — Telephone Encounter (Signed)
Returned patient's phone call regarding rescheduling 09/17 appointment, per patient's request appointment has been moved to 10/08.

## 2019-06-08 ENCOUNTER — Encounter: Payer: Self-pay | Admitting: Physician Assistant

## 2019-06-08 DIAGNOSIS — F331 Major depressive disorder, recurrent, moderate: Secondary | ICD-10-CM | POA: Insufficient documentation

## 2019-06-08 NOTE — Patient Instructions (Signed)

## 2019-06-08 NOTE — Telephone Encounter (Signed)
Patient came in to sign ROI.

## 2019-06-11 ENCOUNTER — Other Ambulatory Visit: Payer: Self-pay

## 2019-06-11 DIAGNOSIS — R6889 Other general symptoms and signs: Secondary | ICD-10-CM | POA: Diagnosis not present

## 2019-06-11 DIAGNOSIS — Z20822 Contact with and (suspected) exposure to covid-19: Secondary | ICD-10-CM

## 2019-06-11 NOTE — Progress Notes (Unsigned)
[place]

## 2019-06-13 LAB — NOVEL CORONAVIRUS, NAA: SARS-CoV-2, NAA: NOT DETECTED

## 2019-06-28 ENCOUNTER — Encounter: Payer: Self-pay | Admitting: Adult Health

## 2019-06-28 ENCOUNTER — Inpatient Hospital Stay: Payer: Medicaid Other | Admitting: Adult Health

## 2019-06-28 ENCOUNTER — Inpatient Hospital Stay: Payer: Medicaid Other | Attending: Oncology

## 2019-06-28 ENCOUNTER — Telehealth: Payer: Self-pay

## 2019-06-28 DIAGNOSIS — C50211 Malignant neoplasm of upper-inner quadrant of right female breast: Secondary | ICD-10-CM | POA: Insufficient documentation

## 2019-06-28 DIAGNOSIS — Z79810 Long term (current) use of selective estrogen receptor modulators (SERMs): Secondary | ICD-10-CM | POA: Insufficient documentation

## 2019-06-28 DIAGNOSIS — I951 Orthostatic hypotension: Secondary | ICD-10-CM | POA: Insufficient documentation

## 2019-06-28 DIAGNOSIS — F419 Anxiety disorder, unspecified: Secondary | ICD-10-CM | POA: Insufficient documentation

## 2019-06-28 DIAGNOSIS — Z79899 Other long term (current) drug therapy: Secondary | ICD-10-CM | POA: Insufficient documentation

## 2019-06-28 DIAGNOSIS — Z170 Estrogen receptor positive status [ER+]: Secondary | ICD-10-CM | POA: Insufficient documentation

## 2019-06-28 DIAGNOSIS — I10 Essential (primary) hypertension: Secondary | ICD-10-CM | POA: Insufficient documentation

## 2019-06-28 DIAGNOSIS — L409 Psoriasis, unspecified: Secondary | ICD-10-CM | POA: Insufficient documentation

## 2019-06-28 DIAGNOSIS — F329 Major depressive disorder, single episode, unspecified: Secondary | ICD-10-CM | POA: Insufficient documentation

## 2019-06-28 DIAGNOSIS — Z9221 Personal history of antineoplastic chemotherapy: Secondary | ICD-10-CM | POA: Insufficient documentation

## 2019-06-28 NOTE — Telephone Encounter (Signed)
Left VM for patient concerning missed appt and No show letter mailed.

## 2019-06-28 NOTE — Progress Notes (Deleted)
ID: Penny Kim   DOB: 1971-08-03  MR#: [id number]  CSN#:[id number]  PCP:  Charolette Forward, NP GYN: [facility] SU: Rolm Bookbinder, MD OTHER MD: Crissie Reese, MD; Ovidio Hanger; Chucky May, MD, Hulan Saas M.D.,  CHIEF COMPLAINT:   Estrogen receptor positive breast cancer  CURRENT TREATMENT: Tamoxifen  BREAST CANCER HISTORY: From the original intake note:  Cyncere had noticed that her right breast dropped a little differently than the left, and occasionally she would have a strange sensation radiating to the right nipple, but she really did not pay much attention to this.  She had her first mammogram ever, a screening mammogram at [facility], 04/23/2010, and this showed diffuse calcifications in the lower inner quadrant of the right breast measuring up to 5.6 cm.  She was recalled 08/08, and Dr. March Rummage found the breast to be dense, which limits mammographic sensitivity, but again was able to demonstrate these calcifications throughout the lower inner quadrant of the right breast.  Biopsy was discussed, and performed 08/09, and this showed [phone]) ductal carcinoma in situ, high grade, which was estrogen receptor 100% and progesterone receptor 57% positive.  Bilateral breast MRIs were obtained 08/14.  This showed in the right breast the area of enhancement to total 7.7 cm anterior posteriorly.  Most of this was nodular and non-mass like, but in addition in the anterior aspect of this, there was a rounded area of mass-like enhancement measuring 1.7 cm.  This portion was suspicious for invasive ductal carcinoma.  The left breast was unremarkable, and there were no suspicious internal mammary or axillary lymph nodes noted.  Accordingly the patient was brought back for biopsy of the more mass-like area on 05/05/2010, and this biopsy ([id number]) showed a grade 2 invasive ductal carcinoma, which was estrogen receptor at 78% and progesterone receptor 82% positive.  The proliferation  marker was elevated at 85%.  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   Her subsequent history is as detailed below.  INTERVAL HISTORY: Penny Kim returns today for follow-up and treatment of her estrogen receptor positive breast cancer.   She continues on tamoxifen, with good tolerance.    REVIEW OF SYSTEMS: Penny Kim   PAST MEDICAL HISTORY: Past Medical History:  Diagnosis Date  . Allergy   . Anxiety   . Breast cancer, right breast ([place]) 02/2010   s/p chemo (completed 09/2010) and right mastectomy w/reconstruction  . Depression   . Family history of breast cancer   . Family history of lung cancer   . Family history of ovarian cancer   . History of stress test    a. treadmill Myoview 11/2016: small defect of mild severity present in the apex location felt to be secondary to breast attenuation. EF 55-65%. Normal study  . HTN (hypertension)   . Orthostatic hypotension   . Psoriasis   . Systolic dysfunction    a. TTE 10/2016: EF 50-55%, no RWMA, normal LV diastolic function, left atrium normal, RV systolic function normal, PASP normal     PAST SURGICAL HISTORY: Past Surgical History:  Procedure Laterality Date  . BREAST RECONSTRUCTION  01/06/2012   Procedure: BREAST RECONSTRUCTION;  Surgeon: Crissie Reese, MD;  Location: [facility];  Service: Plastics;  Laterality: Bilateral;  bilateral removal of tissue expanders, bilateral placement of implants--Breast  . BREAST SURGERY  06/19/2010   exploration of right mastectomy site due to post-op bleeding  . INSERTION OF TISSUE EXPANDER AFTER MASTECTOMY  06/19/2010   bilat. mastectomies with  bilat. tissue expanders  . PORT-A-CATH REMOVAL  11/26/2010  . PORTACATH PLACEMENT  07/23/2010  . TISSUE EXPANDER REMOVAL  10/20/2010   left   FAMILY HISTORY  Family History  Problem Relation Age of Onset  . Hypertension Mother   . Depression Mother   . Heart attack Mother   . Hyperlipidemia Mother   .  Cancer Sister 30       Ovarian cancer  . Hypertension Maternal Grandmother   . Diabetes Maternal Grandmother   . 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 Hypertension Maternal Grandfather   . Diabetes Paternal Grandmother   . Hypertension Paternal Grandmother   . Kidney disease Paternal Grandmother        End stage renal dz - dialysis  . Hyperlipidemia Paternal Grandmother   . Breast cancer Paternal Grandmother   . Alcohol abuse Father   . Depression Father   . Drug abuse Father 89       Died of drug overdose  The patient's father died at the age of 66 from drug overdose in the setting of ETOH abuse.  The patient's mother died in 12-09-2015.Marland Kitchen  She has a significant history of depression.  The patient has one sister, also with a history of depression.  GYNECOLOGIC HISTORY:   (Updated 03/11/2014) She is GX, P0.  She stopped having periods with chemotherapy in approximately November 2011. These resumed following a fall in Dec 08, 2013, and have been somewhat irregular since that time.  (Patient is aware that tamoxifen is not a birth control method, and she does need to use barrier forms of birth control if and when necessary.)  SOCIAL HISTORY:  (Updated 09/26/2016 Eleisha continues to work at various jobs, mostly part-time When asked who she would call in case of a problem, she really does not have anyone that she would call.  She does not feel her family is supportive, and she has no close friends.                           ADVANCED DIRECTIVES: Not in place  HEALTH MAINTENANCE:  (Updated 03/11/2014) Social History   Tobacco Use  . Smoking status: Never Smoker  . Smokeless tobacco: Never Used  Substance Use Topics  . Alcohol use: No    Frequency: Never    Comment: occasionally  . Drug use: No                Colonoscopy: Never             PAP: March 2015             Bone density: Never             Lipid panel:  November 2014  Allergies   Allergen Reactions  . Prochlorperazine Other (See Comments)    SYNCOPE SYNCOPE  . Methadone Hcl Itching  . Other   . Tape Rash     Current Outpatient Medications  Medication Sig Dispense Refill  . amLODipine (NORVASC) 5 MG tablet Take 1 tablet (5 mg total) by mouth daily. 90 tablet 3  . clobetasol ointment (TEMOVATE) 6.37 % Apply 1 application topically 2 (two) times daily. 60 g 1  . clonazePAM (KLONOPIN) 1 MG tablet Take 1 mg by mouth 4 (four) times daily.  3  . fluticasone (FLONASE) 50 MCG/ACT nasal spray SPRAY 2 SPRAYS INTO EACH NOSTRIL EVERY DAY 48  mL 0  . gabapentin (NEURONTIN) 300 MG capsule TAKE 1 CAPSULE BY MOUTH FOUR TIMES A DAY  4  . hydrochlorothiazide (MICROZIDE) 12.5 MG capsule Take 1 capsule (12.5 mg total) by mouth daily. 90 capsule 3  . tamoxifen (NOLVADEX) 20 MG tablet TAKE 1 TABLET BY MOUTH EVERY DAY 90 tablet 4  . vortioxetine HBr (TRINTELLIX) 20 MG TABS tablet Take 20 mg by mouth daily.     No current facility-administered medications for this visit.     OBJECTIVE: Young African-American woman who appears stated age, examined with scribe in room    Filed Vitals:   There were no vitals filed for this visit. GENERAL: Patient is a well appearing female in no acute distress HEENT:  Sclerae anicteric.  Oropharynx clear and moist. No ulcerations or evidence of oropharyngeal candidiasis. Neck is supple.  NODES:  No cervical, supraclavicular, or axillary lymphadenopathy palpated.  BREAST EXAM:  Deferred. LUNGS:  Clear to auscultation bilaterally.  No wheezes or rhonchi. HEART:  Regular rate and rhythm. No murmur appreciated. ABDOMEN:  Soft, nontender.  Positive, normoactive bowel sounds. No organomegaly palpated. MSK:  No focal spinal tenderness to palpation. Full range of motion bilaterally in the upper extremities. EXTREMITIES:  No peripheral edema.   SKIN:  Clear with no obvious rashes or skin changes. No nail dyscrasia. NEURO:  Nonfocal. Well oriented.   Appropriate affect.    STUDIES: Hip films reviewed with the patient  LABS: CBC    Component Value Date/Time   WBC 6.8 09/01/2017 1205   WBC 3.9 (L) 08/06/2016 0846   RBC 4.36 09/01/2017 1205   RBC 4.48 08/06/2016 0846   HGB 12.5 09/01/2017 1205   HGB 12.9 10/13/2015 1406   HCT 36.3 09/01/2017 1205   HCT 37.1 10/13/2015 1406   PLT 273 09/01/2017 1205   MCV 83 09/01/2017 1205   MCV 83.9 10/13/2015 1406   MCH 28.7 09/01/2017 1205   MCH 29.2 10/13/2015 1406   MCH 28.1 03/22/2011 0532   MCHC 34.4 09/01/2017 1205   MCHC 33.9 08/06/2016 0846   RDW 14.2 09/01/2017 1205   RDW 12.7 10/13/2015 1406   LYMPHSABS 1.4 09/01/2017 1205   LYMPHSABS 1.6 10/13/2015 1406   MONOABS 0.4 10/13/2015 1406   EOSABS 0.2 09/01/2017 1205   BASOSABS 0.0 09/01/2017 1205   BASOSABS 0.0 10/13/2015 1406   CMP Latest Ref Rng & Units 06/27/2018 09/01/2017 08/06/2016  Glucose 65 - 99 mg/dL 105(H) 104(H) 108(H)  BUN 6 - 24 mg/dL '7 6 12  ' Creatinine 0.57 - 1.00 mg/dL 0.94 0.80 0.87  Sodium 134 - 144 mmol/L 139 139 141  Potassium 3.5 - 5.2 mmol/L 4.0 4.5 3.5  Chloride 96 - 106 mmol/L 97 102 103  CO2 20 - 29 mmol/L '26 25 30  ' Calcium 8.7 - 10.2 mg/dL 9.8 9.8 9.1  Total Protein 6.0 - 8.3 g/dL - - 6.9  Total Bilirubin 0.2 - 1.2 mg/dL - - 0.3  Alkaline Phos 39 - 117 U/L - - 30(L)  AST 0 - 37 U/L - - 15  ALT 0 - 35 U/L - - 11      ASSESSMENT: 48 y.o[place] woman with  a BRCA 2 mutation of uncertain significance:  (1)  status post bilateral mastectomies September 2011 for a right-sided T2 N0, stage IIA invasive ductal carcinoma, grade 3, estrogen and progesterone receptor positive, HER2 negative with an elevated MIB-1,   (2)  treated with 4 cycles of adjuvant doxorubicin/cyclophosphamide given in dose-dense fashion and 4  weekly doses of paclitaxel of 12 planned, discontinued because of neuropathy.    (3) She did not require radiation.   (4)  She started tamoxifen in March 2012          (a)  stopped tamoxifen January 2018, resumed January 2019   PLAN:  Penny Kim   She knows to call for any other issues that may develop before then.         Wilber Bihari, NP  06/28/19 7:59 AM Medical Oncology and Hematology [facility] [address] [place], [place] [address] Tel. [phone]    Fax. [phone]

## 2019-07-16 ENCOUNTER — Telehealth: Payer: Self-pay | Admitting: Oncology

## 2019-07-16 NOTE — Telephone Encounter (Signed)
Returned patient's phone call regarding rescheduling missed 10/08 appointment, per patient's request appointment has moved to 10/27.

## 2019-07-16 NOTE — Progress Notes (Signed)
[place]  Telephone:([phone]) [phone] Fax:(336) [phone]     ID: VERLA BRYNGELSON DOB: [birth year]  MR#: [id number]  VOZ#:[id number]  Patient Care Team: [name] as PCP - General (Physician Assistant) Franck Vinal, Virgie Dad, MD as Consulting Physician (Oncology) End, Harrell Gave, MD as Consulting Physician (Cardiology) Edrick Kins, DPM as Consulting Physician (Podiatry) Chauncey Cruel, MD OTHER MD:  CHIEF COMPLAINT: estrogen receptor positive breast cancer (s/p bilateral mastectomies)  CURRENT TREATMENT: tamoxifen   INTERVAL HISTORY: Captola returns today for follow-up of her estrogen receptor positive breast cancer.   She continues on tamoxifen.  She does not have significant problems from this medication.  She does have increased vaginal wetness and has to wear a pad.  She has some night time hot flashes.  Since her last visit in 04/2018, she underwent repeat genetic testing on 06/27/2018, which yielded negative results. Previous BRCA2 VUS was reclassified to favor polymorphism.  Since her last visit, she has not undergone any additional studies.   She did, however, undergo coronavirus testing, most recently on 06/11/2019, which was negative.   REVIEW OF SYSTEMS: Trystin is not doing very well.  At home all she can do is try to get her dishes done and try to get her laundry done.  Her hands do the shopping for her and they look in on her to make sure she gets things done.  She tells me her hips are "self distracting", and she does have MRI of the hips from February and March of last year which show significant avascular necrosis involving both hips.  She is now walking with a cane.  Her mood medications are working and that her mood is fairly stable, but they are limiting her capacity and anything that requires forward thinking or planning or multitasking really is not something that she can undertake.  She continues to have pain in her chest wall where she had  the reconstruction.  This is fairly chronic.  She has not had fevers, bleeding, rash, except for the psoriasis in her right foot, which is chronic and does also impede her walking to some extent.  A detailed review of systems today was otherwise stable.   HISTORY OF CURRENT ILLNESS: From the original intake note:  Mardie had noticed that her right breast dropped a little differently than the left, and occasionally she would have a strange sensation radiating to the right nipple, but she really did not pay much attention to this. She had her first mammogram ever, a screening mammogram at [facility], 04/23/2010, and this showed diffuse calcifications in the lower inner quadrant of the right breast measuring up to 5.6 cm. She was recalled 08/08, and Dr. March Rummage found the breast to be dense, which limits mammographic sensitivity, but again was able to demonstrate these calcifications throughout the lower inner quadrant of the right breast. Biopsy was discussed, and performed 08/09, and this showed [phone]) ductal carcinoma in situ, high grade, which was estrogen receptor 100% and progesterone receptor 57% positive.  Bilateral breast MRIs were obtained 08/14. This showed in the right breast the area of enhancement to total 7.7 cm anterior posteriorly. Most of this was nodular and non-mass like, but in addition in the anterior aspect of this, there was a rounded area of mass-like enhancement measuring 1.7 cm. This portion was suspicious for invasive ductal carcinoma. The left breast was unremarkable, and there were no suspicious internal mammary or axillary lymph nodes noted.  Accordingly the patient was brought back  for biopsy of the more mass-like area on 05/05/2010, and this biopsy ([id number]) showed a grade 2 invasive ductal carcinoma, which was estrogen receptor at 78% and progesterone receptor 82% positive. The proliferation marker was elevated at 85%. The HER-2 ratio was 1.67, even though  there was evidence of polysomy.  Her subsequent history is as detailed below.   PAST MEDICAL HISTORY: Past Medical History:  Diagnosis Date  . Allergy   . Anxiety   . Breast cancer, right breast ([place]) 02/2010   s/p chemo (completed 09/2010) and right mastectomy w/reconstruction  . Depression   . Family history of breast cancer   . Family history of lung cancer   . Family history of ovarian cancer   . History of stress test    a. treadmill Myoview 11/2016: small defect of mild severity present in the apex location felt to be secondary to breast attenuation. EF 55-65%. Normal study  . HTN (hypertension)   . Orthostatic hypotension   . Psoriasis   . Systolic dysfunction    a. TTE December 16, 2016: EF 50-55%, no RWMA, normal LV diastolic function, left atrium normal, RV systolic function normal, PASP normal    PAST SURGICAL HISTORY: Past Surgical History:  Procedure Laterality Date  . BREAST RECONSTRUCTION  01/06/2012   Procedure: BREAST RECONSTRUCTION;  Surgeon: Crissie Reese, MD;  Location: [place];  Service: Plastics;  Laterality: Bilateral;  bilateral removal of tissue expanders, bilateral placement of implants--Breast  . BREAST SURGERY  06/19/2010   exploration of right mastectomy site due to post-op bleeding  . INSERTION OF TISSUE EXPANDER AFTER MASTECTOMY  06/19/2010   bilat. mastectomies with bilat. tissue expanders  . PORT-A-CATH REMOVAL  11/26/2010  . PORTACATH PLACEMENT  07/23/2010  . TISSUE EXPANDER REMOVAL  10/20/2010   left    FAMILY HISTORY: Family History  Problem Relation Age of Onset  . Hypertension Mother   . Depression Mother   . Heart attack Mother   . Hyperlipidemia Mother   . Cancer Sister 30       Ovarian cancer  . Hypertension Maternal Grandmother   . Diabetes Maternal Grandmother   . 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 Hypertension Maternal Grandfather   . Diabetes Paternal Grandmother   . Hypertension  Paternal Grandmother   . Kidney disease Paternal Grandmother        End stage renal dz - dialysis  . Hyperlipidemia Paternal Grandmother   . Breast cancer Paternal Grandmother   . Alcohol abuse Father   . Depression Father   . Drug abuse Father 77       Died of drug overdose  The patient's father died at the age of 77 from drug overdose in the setting of ETOH abuse. The patient's mother died in 2015/12/17 at age 92. She has a significant history of depression. The patient has one sister, also with a history of depression, who was diagnosed with ovarian cancer at age 75.   GYNECOLOGIC HISTORY:  No LMP recorded. (Menstrual status: Other). GX P 0 LMP irregular following chemotherapy (since 2013/12/16) Hysterectomy? no BSO? no   SOCIAL HISTORY: (updated 06/2019)  Maleeka is currently on disability.When asked who she would call in case of a problem, she really does not have anyone that she would call. She does not feel her family is supportive, and she has no close friends.     ADVANCED DIRECTIVES: not in place   HEALTH  MAINTENANCE: Social History   Tobacco Use  . Smoking status: Never Smoker  . Smokeless tobacco: Never Used  Substance Use Topics  . Alcohol use: No    Frequency: Never    Comment: occasionally  . Drug use: No     Colonoscopy: n/a  PAP: 11/2017, negative  Bone density: n/a   Allergies  Allergen Reactions  . Prochlorperazine Other (See Comments)    SYNCOPE SYNCOPE  . Methadone Hcl Itching  . Other   . Tape Rash    Current Outpatient Medications  Medication Sig Dispense Refill  . amLODipine (NORVASC) 5 MG tablet Take 1 tablet (5 mg total) by mouth daily. 90 tablet 3  . clobetasol ointment (TEMOVATE) 7.61 % Apply 1 application topically 2 (two) times daily. 60 g 1  . clonazePAM (KLONOPIN) 1 MG tablet Take 1 mg by mouth 4 (four) times daily.  3  . fluticasone (FLONASE) 50 MCG/ACT nasal spray SPRAY 2 SPRAYS INTO EACH NOSTRIL EVERY DAY 48 mL 0  . gabapentin  (NEURONTIN) 300 MG capsule TAKE 1 CAPSULE BY MOUTH FOUR TIMES A DAY  4  . hydrochlorothiazide (MICROZIDE) 12.5 MG capsule Take 1 capsule (12.5 mg total) by mouth daily. 90 capsule 3  . tamoxifen (NOLVADEX) 20 MG tablet TAKE 1 TABLET BY MOUTH EVERY DAY 90 tablet 4  . vortioxetine HBr (TRINTELLIX) 20 MG TABS tablet Take 20 mg by mouth daily.     No current facility-administered medications for this visit.     OBJECTIVE: Young African-American woman using a cane  Vitals:   07/17/19 1028  BP: 123/80  Pulse: 78  Resp: 18  Temp: 98.2 F (36.8 C)  SpO2: 97%     Body mass index is 25.42 kg/m.   Wt Readings from Last 3 Encounters:  07/17/19 150 lb 6.4 oz (68.2 kg)  10/25/18 136 lb 12.8 oz (62.1 kg)  06/27/18 128 lb (58.1 kg)      ECOG FS:3 - Symptomatic, >50% confined to bed  Ocular: Sclerae unicteric, EOMs intact Ear-nose-throat: Using a mask Lymphatic: No cervical or supraclavicular adenopathy Lungs no rales or rhonchi Heart regular rate and rhythm Abd soft, nontender, positive bowel sounds MSK no focal spinal tenderness, no joint edema Neuro: non-focal, well-oriented, flat affect, somewhat slow responses Breasts: Status post bilateral mastectomies with bilateral implant reconstruction.  There is no evidence of chest wall recurrence.  Both axillae are benign.    LAB RESULTS:  CMP     Component Value Date/Time   NA 139 07/17/2019 0941   NA 139 06/27/2018 1559   NA 138 10/13/2015 1406   K 3.6 07/17/2019 0941   K 3.6 10/13/2015 1406   CL 100 07/17/2019 0941   CL 105 01/30/2013 1313   CO2 29 07/17/2019 0941   CO2 27 10/13/2015 1406   GLUCOSE 113 (H) 07/17/2019 0941   GLUCOSE 114 10/13/2015 1406   GLUCOSE 94 01/30/2013 1313   BUN 8 07/17/2019 0941   BUN 7 06/27/2018 1559   BUN 10.2 10/13/2015 1406   CREATININE 1.29 (H) 07/17/2019 0941   CREATININE 1.0 10/13/2015 1406   CALCIUM 9.4 07/17/2019 0941   CALCIUM 9.5 10/13/2015 1406   PROT 7.8 07/17/2019 0941   PROT 7.4  10/13/2015 1406   ALBUMIN 4.0 07/17/2019 0941   ALBUMIN 4.0 10/13/2015 1406   AST 24 07/17/2019 0941   AST 22 10/13/2015 1406   ALT 16 07/17/2019 0941   ALT 15 10/13/2015 1406   ALKPHOS 43 07/17/2019 0941   ALKPHOS  38 (L) 10/13/2015 1406   BILITOT 0.4 07/17/2019 0941   BILITOT <0.30 10/13/2015 1406   GFRNONAA 49 (L) 07/17/2019 0941   GFRAA 57 (L) 07/17/2019 0941    No results found for: TOTALPROTELP, ALBUMINELP, A1GS, A2GS, BETS, BETA2SER, GAMS, MSPIKE, SPEI  No results found for: KPAFRELGTCHN, LAMBDASER, [facility]  Lab Results  Component Value Date   WBC 5.7 07/17/2019   NEUTROABS 2.4 07/17/2019   HGB 12.4 07/17/2019   HCT 37.7 07/17/2019   MCV 85.7 07/17/2019   PLT 274 07/17/2019    '@LASTCHEMISTRY' @  Lab Results  Component Value Date   LABCA2 11 09/25/2012    No components found for: HTDSKA768  No results for input(s): INR in the last 168 hours.  Lab Results  Component Value Date   LABCA2 11 09/25/2012    No results found for: TLX726  No results found for: OMB559  No results found for: RCB638  No results found for: CA2729  No components found for: HGQUANT  No results found for: CEA1 / No results found for: CEA1   No results found for: AFPTUMOR  No results found for: [place]  No results found for: PSA1  Appointment on 07/17/2019  Component Date Value Ref Range Status  . Sodium 07/17/2019 139  135 - 145 mmol/L Final  . Potassium 07/17/2019 3.6  3.5 - 5.1 mmol/L Final  . Chloride 07/17/2019 100  98 - 111 mmol/L Final  . CO2 07/17/2019 29  22 - 32 mmol/L Final  . Glucose, Bld 07/17/2019 113* 70 - 99 mg/dL Final  . BUN 07/17/2019 8  6 - 20 mg/dL Final  . Creatinine, Ser 07/17/2019 1.29* 0.44 - 1.00 mg/dL Final  . Calcium 07/17/2019 9.4  8.9 - 10.3 mg/dL Final  . Total Protein 07/17/2019 7.8  6.5 - 8.1 g/dL Final  . Albumin 07/17/2019 4.0  3.5 - 5.0 g/dL Final  . AST 07/17/2019 24  15 - 41 U/L Final  . ALT 07/17/2019 16  0 - 44 U/L Final   . Alkaline Phosphatase 07/17/2019 43  38 - 126 U/L Final  . Total Bilirubin 07/17/2019 0.4  0.3 - 1.2 mg/dL Final  . GFR calc non Af Amer 07/17/2019 49* >60 mL/min Final  . GFR calc Af Amer 07/17/2019 57* >60 mL/min Final  . Anion gap 07/17/2019 10  5 - 15 Final   Performed at [facility], [place] [address], [place], [place] [address]  . WBC 07/17/2019 5.7  4.0 - 10.5 K/uL Final  . RBC 07/17/2019 4.40  3.87 - 5.11 MIL/uL Final  . Hemoglobin 07/17/2019 12.4  12.0 - 15.0 g/dL Final  . HCT 07/17/2019 37.7  36.0 - 46.0 % Final  . MCV 07/17/2019 85.7  80.0 - 100.0 fL Final  . MCH 07/17/2019 28.2  26.0 - 34.0 pg Final  . MCHC 07/17/2019 32.9  30.0 - 36.0 g/dL Final  . RDW 07/17/2019 13.3  11.5 - 15.5 % Final  . Platelets 07/17/2019 274  150 - 400 K/uL Final  . nRBC 07/17/2019 0.0  0.0 - 0.2 % Final  . Neutrophils Relative % 07/17/2019 43  % Final  . Neutro Abs 07/17/2019 2.4  1.7 - 7.7 K/uL Final  . Lymphocytes Relative 07/17/2019 29  % Final  . Lymphs Abs 07/17/2019 1.7  0.7 - 4.0 K/uL Final  . Monocytes Relative 07/17/2019 11  % Final  . Monocytes Absolute 07/17/2019 0.6  0.1 - 1.0 K/uL Final  . Eosinophils Relative 07/17/2019 16  %  Final  . Eosinophils Absolute 07/17/2019 0.9* 0.0 - 0.5 K/uL Final  . Basophils Relative 07/17/2019 1  % Final  . Basophils Absolute 07/17/2019 0.1  0.0 - 0.1 K/uL Final  . Immature Granulocytes 07/17/2019 0  % Final  . Abs Immature Granulocytes 07/17/2019 0.01  0.00 - 0.07 K/uL Final   Performed at [facility], [address] [place], [place] [address]    (this displays the last labs from the last 3 days)  No results found for: TOTALPROTELP, ALBUMINELP, A1GS, A2GS, BETS, BETA2SER, GAMS, MSPIKE, SPEI (this displays SPEP labs)  No results found for: KPAFRELGTCHN, LAMBDASER, KAPLAMBRATIO (kappa/lambda light chains)  No results found for: HGBA, HGBA2QUANT, HGBFQUANT, HGBSQUAN (Hemoglobinopathy evaluation)    No results found for: LDH  No results found for: IRON, TIBC, IRONPCTSAT (Iron and TIBC)  No results found for: FERRITIN  Urinalysis    Component Value Date/Time   COLORURINE YELLOW 03/19/2011 1250   APPEARANCEUR CLEAR 03/19/2011 1250   LABSPEC 1.005 08/07/2013 1155   PHURINE 6.0 08/07/2013 1155   PHURINE 5.5 12/27/2011 1623   GLUCOSEU Negative 08/07/2013 1155   HGBUR Negative 08/07/2013 [place] 12/27/2011 1623   BILIRUBINUR Negative 08/07/2013 1155   KETONESUR Negative 08/07/2013 [place] 12/27/2011 1623   PROTEINUR Negative 08/07/2013 1155   PROTEINUR NEGATIVE 12/27/2011 1623   UROBILINOGEN 0.2 08/07/2013 1155   NITRITE Negative 08/07/2013 1155   NITRITE NEGATIVE 12/27/2011 1623   LEUKOCYTESUR Negative 08/07/2013 1155     STUDIES: No results found.   ASSESSMENT: 48 y.o. [place] woman with a BRCA 2 mutation of uncertain significance:  (1) status post bilateral mastectomies September 2011 for a right-sided T2 N0, stage IIAinvasive ductal carcinoma,grade 3,estrogen and progesterone receptor positive, HER2 negative with an elevated MIB-1,   (2) treated with 4 cycles of adjuvant doxorubicin/cyclophosphamidegiven in dose-dense fashion and 4 weekly doses of paclitaxelof 12 planned, discontinued because of neuropathy.   (3) She did not require radiation.   (4) She started tamoxifenin March 2012          (a) stopped tamoxifen January 2018  (b) resumed tamoxifen January 2019  (5) genetic testing on 06/27/2018  (a) Previous BRCA2 VUS was reclassified to favor polymorphism on 05/06/2014.  The remainder of the BRCA1 and BRCA2 genes were negative through [facility].  No BART.   (b)Updated genetic testing 07/05/2018-through the [facility] gene panel offered by [employer] found no deleterious mutations in APC, ATM, BARD1, BMPR1A, BRCA1, BRCA2, BRIP1, CHD1, CDK4, CDKN2A, CHEK2, EPCAM (large rearrangement only),  GREM1, HOXB13, AXIN2, MSH3, NTHL1, RNF43, GALNT12, RSP20, MLH1, MSH2, MSH6, MUTYH, NBN, PALB2, PMS2, PTEN, RAD51C, RAD51D, SMAD4, STK11, and TP53. Sequencing was performed for select regions of POLE and POLD1, and large rearrangement analysis was performed for select regions of GREM1.    PLAN: Latanza is now just over 9 years out from definitive surgery for her breast cancer with no evidence of disease recurrence.  This is very favorable.  The plan is to continue tamoxifen through March 2023.  She is tolerating that generally well.  She has multiple issues including avascular necrosis of both hips, significant limitations secondary to her psychological/mood problems, and other concerns listed in the review of systems above which unfortunately do severely limit her functional capacity.  She brought a formal disability evaluation form to me.  I explained that I do not do such things as measuring what reach she has or how long she can stand  etc.  I do think that in my opinion it would not be possible for her to hold a job that requires any physical strength or persistent mental acuity.  She will see me again in 1 year.  She knows to call for any other issue that may develop before then.  Chauncey Cruel, MD   07/17/2019 10:43 AM Medical Oncology and Hematology [facility] [place], [place] [address] Tel. [phone]    Fax. [phone]   I, Wilburn Mylar, am acting as scribe for Dr. Virgie Dad. Trayonna Bachmeier.  I, Lurline Del MD, have reviewed the above documentation for accuracy and completeness, and I agree with the above.

## 2019-07-17 ENCOUNTER — Inpatient Hospital Stay (HOSPITAL_BASED_OUTPATIENT_CLINIC_OR_DEPARTMENT_OTHER): Payer: Medicaid Other | Admitting: Oncology

## 2019-07-17 ENCOUNTER — Other Ambulatory Visit: Payer: Self-pay

## 2019-07-17 ENCOUNTER — Inpatient Hospital Stay: Payer: Medicaid Other

## 2019-07-17 VITALS — BP 123/80 | HR 78 | Temp 98.2°F | Resp 18 | Wt 150.4 lb

## 2019-07-17 DIAGNOSIS — F419 Anxiety disorder, unspecified: Secondary | ICD-10-CM | POA: Diagnosis not present

## 2019-07-17 DIAGNOSIS — L409 Psoriasis, unspecified: Secondary | ICD-10-CM | POA: Diagnosis not present

## 2019-07-17 DIAGNOSIS — C50211 Malignant neoplasm of upper-inner quadrant of right female breast: Secondary | ICD-10-CM

## 2019-07-17 DIAGNOSIS — Z170 Estrogen receptor positive status [ER+]: Secondary | ICD-10-CM

## 2019-07-17 DIAGNOSIS — Z79810 Long term (current) use of selective estrogen receptor modulators (SERMs): Secondary | ICD-10-CM | POA: Diagnosis not present

## 2019-07-17 DIAGNOSIS — F329 Major depressive disorder, single episode, unspecified: Secondary | ICD-10-CM | POA: Diagnosis not present

## 2019-07-17 DIAGNOSIS — I10 Essential (primary) hypertension: Secondary | ICD-10-CM | POA: Diagnosis not present

## 2019-07-17 DIAGNOSIS — Z79899 Other long term (current) drug therapy: Secondary | ICD-10-CM | POA: Diagnosis not present

## 2019-07-17 DIAGNOSIS — Z9221 Personal history of antineoplastic chemotherapy: Secondary | ICD-10-CM

## 2019-07-17 DIAGNOSIS — I951 Orthostatic hypotension: Secondary | ICD-10-CM | POA: Diagnosis not present

## 2019-07-17 LAB — CBC WITH DIFFERENTIAL/PLATELET
Abs Immature Granulocytes: 0.01 10*3/uL (ref 0.00–0.07)
Basophils Absolute: 0.1 10*3/uL (ref 0.0–0.1)
Basophils Relative: 1 %
Eosinophils Absolute: 0.9 10*3/uL — ABNORMAL HIGH (ref 0.0–0.5)
Eosinophils Relative: 16 %
HCT: 37.7 % (ref 36.0–46.0)
Hemoglobin: 12.4 g/dL (ref 12.0–15.0)
Immature Granulocytes: 0 %
Lymphocytes Relative: 29 %
Lymphs Abs: 1.7 10*3/uL (ref 0.7–4.0)
MCH: 28.2 pg (ref 26.0–34.0)
MCHC: 32.9 g/dL (ref 30.0–36.0)
MCV: 85.7 fL (ref 80.0–100.0)
Monocytes Absolute: 0.6 10*3/uL (ref 0.1–1.0)
Monocytes Relative: 11 %
Neutro Abs: 2.4 10*3/uL (ref 1.7–7.7)
Neutrophils Relative %: 43 %
Platelets: 274 10*3/uL (ref 150–400)
RBC: 4.4 MIL/uL (ref 3.87–5.11)
RDW: 13.3 % (ref 11.5–15.5)
WBC: 5.7 10*3/uL (ref 4.0–10.5)
nRBC: 0 % (ref 0.0–0.2)

## 2019-07-17 LAB — COMPREHENSIVE METABOLIC PANEL
ALT: 16 U/L (ref 0–44)
AST: 24 U/L (ref 15–41)
Albumin: 4 g/dL (ref 3.5–5.0)
Alkaline Phosphatase: 43 U/L (ref 38–126)
Anion gap: 10 (ref 5–15)
BUN: 8 mg/dL (ref 6–20)
CO2: 29 mmol/L (ref 22–32)
Calcium: 9.4 mg/dL (ref 8.9–10.3)
Chloride: 100 mmol/L (ref 98–111)
Creatinine, Ser: 1.29 mg/dL — ABNORMAL HIGH (ref 0.44–1.00)
GFR calc Af Amer: 57 mL/min — ABNORMAL LOW (ref >60)
GFR calc non Af Amer: 49 mL/min — ABNORMAL LOW (ref >60)
Glucose, Bld: 113 mg/dL — ABNORMAL HIGH (ref 70–99)
Potassium: 3.6 mmol/L (ref 3.5–5.1)
Sodium: 139 mmol/L (ref 135–145)
Total Bilirubin: 0.4 mg/dL (ref 0.3–1.2)
Total Protein: 7.8 g/dL (ref 6.5–8.1)

## 2019-07-17 MED ORDER — TAMOXIFEN CITRATE 20 MG PO TABS: 20.0000 mg | ORAL_TABLET | Freq: Every day | ORAL | 4 refills | Status: DC

## 2019-07-18 ENCOUNTER — Telehealth: Payer: Self-pay | Admitting: Oncology

## 2019-07-18 NOTE — Telephone Encounter (Signed)
I talk with patient regarding schedule  

## 2019-08-03 ENCOUNTER — Encounter: Payer: Self-pay | Admitting: Oncology

## 2019-08-03 ENCOUNTER — Encounter: Payer: Self-pay | Admitting: *Deleted

## 2019-08-20 ENCOUNTER — Other Ambulatory Visit: Payer: Self-pay

## 2019-08-20 MED ORDER — HYDROCHLOROTHIAZIDE 12.5 MG PO CAPS: 12.5000 mg | ORAL_CAPSULE | Freq: Every day | ORAL | 0 refills | Status: DC

## 2019-08-20 MED ORDER — AMLODIPINE BESYLATE 5 MG PO TABS: 5.0000 mg | ORAL_TABLET | Freq: Every day | ORAL | 0 refills | Status: DC

## 2019-08-20 NOTE — Telephone Encounter (Signed)
Requested Prescriptions   Signed Prescriptions Disp Refills  . amLODipine (NORVASC) 5 MG tablet 30 tablet 0    Sig: Take 1 tablet (5 mg total) by mouth daily.    Authorizing Provider: Rise Mu    Ordering User: Raelene Bott, Nysir Fergusson L  . hydrochlorothiazide (MICROZIDE) 12.5 MG capsule 30 capsule 0    Sig: Take 1 capsule (12.5 mg total) by mouth daily.    Authorizing Provider: Rise Mu    Ordering User: Raelene Bott, Mckaylie Vasey L

## 2019-08-20 NOTE — Telephone Encounter (Signed)
*  STAT* If patient is at the pharmacy, call can be transferred to refill team.   1. Which medications need to be refilled? (please list name of each medication and dose if known)  Amlodipine and hydrochlorothiazide  2. Which pharmacy/location (including street and city if local pharmacy) is medication to be sent to? [facility]  3. Do they need a 30 day or 90 day supply? [place]

## 2019-09-10 ENCOUNTER — Ambulatory Visit: Payer: PRIVATE HEALTH INSURANCE | Admitting: Physician Assistant

## 2019-09-10 ENCOUNTER — Telehealth: Payer: Self-pay | Admitting: Physician Assistant

## 2019-09-13 ENCOUNTER — Other Ambulatory Visit: Payer: Self-pay | Admitting: Physician Assistant

## 2019-09-20 ENCOUNTER — Ambulatory Visit: Payer: Self-pay | Admitting: Internal Medicine

## 2019-09-20 NOTE — Progress Notes (Deleted)
Follow-up Outpatient Visit Date: 09/20/2019  Primary Care Provider: Trinna Post, PA-C [address] [place] [address]  Chief Complaint: ***  HPI:  Ms. Kleinsmith is a 48 y.o. female with history of orthostatic hypotension with syncope, HTN, right-sided breast cancer s/pbilateralmastectomieswith reconstruction and chemotherapy(doxorubicin/cyclophosphamide, paclitaxel) in 09/2010, anxiety, and depression, who presents for follow-up of hypertension and syncope.  She was previously followed in our office by Dr. Farrel Conners and was last seen by Christell Faith, PA in 06/2018.  At that time, she was doing well from a heart standpoint.  She noted that her blood pressure has been under better control with medication compliance, though she reported occasional systolic pressures up to 99991111 mmHg when "stressed out."  No medication changes or additional testing were recommended.  --------------------------------------------------------------------------------------------------  Past Medical History:  Diagnosis Date  . Allergy   . Anxiety   . Breast cancer, right breast ([place]) 02/2010   s/p chemo (completed 09/2010) and right mastectomy w/reconstruction  . Depression   . Family history of breast cancer   . Family history of lung cancer   . Family history of ovarian cancer   . History of stress test    a. treadmill Myoview 11/2016: small defect of mild severity present in the apex location felt to be secondary to breast attenuation. EF 55-65%. Normal study  . HTN (hypertension)   . Orthostatic hypotension   . Psoriasis   . Systolic dysfunction    a. TTE 10/2016: EF 50-55%, no RWMA, normal LV diastolic function, left atrium normal, RV systolic function normal, PASP normal   Past Surgical History:  Procedure Laterality Date  . BREAST RECONSTRUCTION  01/06/2012   Procedure: BREAST RECONSTRUCTION;  Surgeon: Crissie Reese, MD;  Location: [facility];  Service: Plastics;  Laterality:  Bilateral;  bilateral removal of tissue expanders, bilateral placement of implants--Breast  . BREAST SURGERY  06/19/2010   exploration of right mastectomy site due to post-op bleeding  . INSERTION OF TISSUE EXPANDER AFTER MASTECTOMY  06/19/2010   bilat. mastectomies with bilat. tissue expanders  . PORT-A-CATH REMOVAL  11/26/2010  . PORTACATH PLACEMENT  07/23/2010  . TISSUE EXPANDER REMOVAL  10/20/2010   left    No outpatient medications have been marked as taking for the 09/20/19 encounter (Appointment) with Cindy Fullman, Harrell Gave, MD.    Allergies: Prochlorperazine, Methadone hcl, Other, and Tape  Social History   Tobacco Use  . Smoking status: Never Smoker  . Smokeless tobacco: Never Used  Substance Use Topics  . Alcohol use: No    Comment: occasionally  . Drug use: No    Family History  Problem Relation Age of Onset  . Hypertension Mother   . Depression Mother   . Heart attack Mother   . Hyperlipidemia Mother   . Cancer Sister 30       Ovarian cancer  . Hypertension Maternal Grandmother   . Diabetes Maternal Grandmother   . 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 Hypertension Maternal Grandfather   . Diabetes Paternal Grandmother   . Hypertension Paternal Grandmother   . Kidney disease Paternal Grandmother        Faelynn Wynder stage renal dz - dialysis  . Hyperlipidemia Paternal Grandmother   . Breast cancer Paternal Grandmother   . Alcohol abuse Father   . Depression Father   . Drug abuse Father 93       Died of drug overdose  Review of Systems: A 12-system review of systems was performed and was negative except as noted in the HPI.  --------------------------------------------------------------------------------------------------  Physical Exam: There were no vitals taken for this visit.  General:  *** HEENT: No conjunctival pallor or scleral icterus. Facemask in place. Neck: Supple without lymphadenopathy, thyromegaly, JVD, or HJR.  Lungs: Normal work of breathing. Clear to auscultation bilaterally without wheezes or crackles. Heart: Regular rate and rhythm without murmurs, rubs, or gallops. Non-displaced PMI. Abd: Bowel sounds present. Soft, NT/ND without hepatosplenomegaly Ext: No lower extremity edema. Radial, PT, and DP pulses are 2+ bilaterally. Skin: Warm and dry without rash.  EKG:  ***  Lab Results  Component Value Date   WBC 5.7 07/17/2019   HGB 12.4 07/17/2019   HCT 37.7 07/17/2019   MCV 85.7 07/17/2019   PLT 274 07/17/2019    Lab Results  Component Value Date   NA 139 07/17/2019   K 3.6 07/17/2019   CL 100 07/17/2019   CO2 29 07/17/2019   BUN 8 07/17/2019   CREATININE 1.29 (H) 07/17/2019   GLUCOSE 113 (H) 07/17/2019   ALT 16 07/17/2019    Lab Results  Component Value Date   CHOL 203 (H) 09/01/2017   HDL 73 09/01/2017   LDLCALC 112 (H) 09/01/2017   LDLDIRECT 124 (H) 09/01/2017   TRIG 90 09/01/2017   CHOLHDL 4 08/06/2016    --------------------------------------------------------------------------------------------------  ASSESSMENT AND PLAN: Harrell Gave Philana Younis, MD 09/20/2019 7:16 AM

## 2019-09-27 ENCOUNTER — Other Ambulatory Visit: Payer: Self-pay | Admitting: Physician Assistant

## 2019-09-29 ENCOUNTER — Other Ambulatory Visit: Payer: Self-pay | Admitting: Physician Assistant

## 2019-10-08 NOTE — Progress Notes (Deleted)
Cardiology Office Note    Date:  10/08/2019   ID:  Penny Kim, DOB 09/08/1971, MRN [id number]  PCP:  Trinna Post, PA-C  Cardiologist:  Nelva Bush, MD  Electrophysiologist:  None   Chief Complaint: Follow-up  History of Present Illness:   Penny Kim is a 49 y.o. female with history of syncope related to orthostatic hypotension, right-sided breast cancer status post bilateral mastectomies with reconstruction and chemotherapy in 09/2010, HTN, avascular necrosis of the bilateral hips, anxiety, and depression who presents for follow-up of hypertension.  Patient was previously evaluated by Dr. Farrel Conners in 07/2016 for syncope that dated back to 2012.  Episodes had been described as lightheadedness associated with positional changes and following using the restroom.  She was noted to be orthostatic in 07/2016.  At that time, her HCTZ was discontinued and amlodipine was decreased to 2.5 mg daily.  Echo was subsequently completed in 10/2016 which showed an EF of 50 to 55%, no R WMA, normal LV diastolic function, normal size left atrium, normal RV SF and PASP.  Nuclear stress test was subsequently completed in 11/2016 which showed a small defect of mild severity present in the apex location felt to be secondary to breast attenuation artifact, EF 55 to 65%, overall a normal study.  In follow-up in 08/2017 she was under increased stress and dealing with sinus/nasal congestion with taking of OTC Sudafed with her BP in the [id number] systolic.  It was recommended she discontinue Afrin/Sudafed and her amlodipine was titrated to 5 mg daily along with the resumption of HCTZ 12.5 mg daily.  She was last seen in the office in 06/2018 and was doing well from a cardiac perspective.  She continued to note significant nasal/sinus congestion, though was no longer using Sudafed and Afrin.  BP was overall improving when she was taking her medications.  She did note some peaks into the A999333 systolic when she was stressed  over financial issues.  BP at her visit that time was 120/60 with a heart rate of 90 bpm.  She was continued on amlodipine 5 mg daily along with HCTZ 12.5 mg daily.  She had not had any further episodes of orthostasis/syncope.  Most recent BP in Epic at her oncology follow-up which was 123/80 from 07/17/2019.  ***   Labs independently reviewed: 06/2019 - Hgb 12.4, PLT 274, potassium 3.6, BUN 8, serum creatinine 1.29, albumin 4.0, AST/ALT normal 08/2017 - direct LDL 124, TC 203, TG 90, HDL 73, LDL 112, TSH normal, magnesium 1.8  Past Medical History:  Diagnosis Date   Allergy    Anxiety    Breast cancer, right breast ([place]) 02/2010   s/p chemo (completed 09/2010) and right mastectomy w/reconstruction   Depression    Family history of breast cancer    Family history of lung cancer    Family history of ovarian cancer    History of stress test    a. treadmill Myoview 11/2016: small defect of mild severity present in the apex location felt to be secondary to breast attenuation. EF 55-65%. Normal study   HTN (hypertension)    Orthostatic hypotension    Psoriasis    Systolic dysfunction    a. TTE 10/2016: EF 50-55%, no RWMA, normal LV diastolic function, left atrium normal, RV systolic function normal, PASP normal    Past Surgical History:  Procedure Laterality Date   BREAST RECONSTRUCTION  01/06/2012   Procedure: BREAST RECONSTRUCTION;  Surgeon: Crissie Reese, MD;  Location: [facility];  Service: Plastics;  Laterality: Bilateral;  bilateral removal of tissue expanders, bilateral placement of implants--Breast   BREAST SURGERY  06/19/2010   exploration of right mastectomy site due to post-op bleeding   INSERTION OF TISSUE EXPANDER AFTER MASTECTOMY  06/19/2010   bilat. mastectomies with bilat. tissue expanders   PORT-A-CATH REMOVAL  11/26/2010   PORTACATH PLACEMENT  07/23/2010   TISSUE EXPANDER REMOVAL  10/20/2010   left    Current Medications: No outpatient  medications have been marked as taking for the 10/09/19 encounter (Appointment) with Rise Mu, PA-C.    Allergies:   Prochlorperazine, Methadone hcl, Other, and Tape   Social History   Socioeconomic History   Marital status: Single    Spouse name: Not on file   Number of children: Not on file   Years of education: 18   Highest education level: Not on file  Occupational History   Occupation: Scientist, research (medical) Relations    Comment: Ambulance person  Tobacco Use   Smoking status: Never Smoker   Smokeless tobacco: Never Used  Substance and Sexual Activity   Alcohol use: No    Comment: occasionally   Drug use: No   Sexual activity: Not Currently    Birth control/protection: None  Other Topics Concern   Not on file  Social History Narrative   Penny Kim [place], [place]. She attended [employer] where she obtained her Bachelors of Science degree in Psychology. 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 She currently lives in [place]. She lives alone. Taesha enjoys attending live musical performances. 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    Social Determinants of Health   Financial Resource Strain:    Difficulty of Paying Living Expenses: Not on file  Food Insecurity:    Worried About Charity fundraiser in the Last Year: Not on file   [employer] of Food in the Last Year: Not on file  Transportation Needs:    Lack of Transportation (Medical): Not on file   Lack of Transportation (Non-Medical): Not on file  Physical Activity:    Days of Exercise per Week: Not on file   Minutes of Exercise per Session: Not on file  Stress:    Feeling of Stress : Not on file  Social Connections:    Frequency of Communication with Friends and Family: Not on file   Frequency of Social Gatherings with Friends and Family: Not on file   Attends  Religious Services: Not on file   Active Member of Clubs or Organizations: Not on file   Attends Archivist Meetings: Not on file   Marital Status: Not on file     Family History:  The patient's family history includes Alcohol abuse in her father; Breast cancer in her paternal grandmother; Cancer in her maternal grandfather; Cancer (age of onset: 22) in her sister; Depression in her father and mother; Diabetes in her maternal grandmother and paternal grandmother; Drug abuse (age of onset: 44) in her father; Heart attack in her mother; Hyperlipidemia in her mother and paternal grandmother; Hypertension in her maternal grandfather, maternal grandmother, mother, and paternal grandmother; Kidney disease in her paternal grandmother.  ROS:   ROS   EKGs/Labs/Other Studies Reviewed:    Studies reviewed were summarized above. The additional studies were reviewed today:  2D echo 10/2016: - Left ventricle: The cavity size was normal. Systolic  function was   normal. The estimated ejection fraction was in the range of 50%   to 55%. Wall motion was normal; there were no regional wall   motion abnormalities. Left ventricular diastolic function   parameters were normal. - Left atrium: The atrium was normal in size. - Right ventricle: Systolic function was normal. - Pulmonary arteries: Systolic pressure was within the normal   range.  Impressions:  - Sinus bradycardia noted.   EKG:  EKG is ordered today.  The EKG ordered today demonstrates ***  Recent Labs: 07/17/2019: ALT 16; BUN 8; Creatinine, Ser 1.29; Hemoglobin 12.4; Platelets 274; Potassium 3.6; Sodium 139  Recent Lipid Panel    Component Value Date/Time   CHOL 203 (H) 09/01/2017 1205   TRIG 90 09/01/2017 1205   HDL 73 09/01/2017 1205   CHOLHDL 4 08/06/2016 0846   VLDL 34.4 08/06/2016 0846   LDLCALC 112 (H) 09/01/2017 1205   LDLDIRECT 124 (H) 09/01/2017 1205    PHYSICAL EXAM:    VS:  There were no vitals taken  for this visit.  BMI: There is no height or weight on file to calculate BMI.  Physical Exam  Wt Readings from Last 3 Encounters:  07/17/19 150 lb 6.4 oz (68.2 kg)  10/25/18 136 lb 12.8 oz (62.1 kg)  06/27/18 128 lb (58.1 kg)     ASSESSMENT & PLAN:   1. ***  Disposition: F/u with Dr. Saunders Revel in ***.   Medication Adjustments/Labs and Tests Ordered: Current medicines are reviewed at length with the patient today.  Concerns regarding medicines are outlined above. Medication changes, Labs and Tests ordered today are summarized above and listed in the Patient Instructions accessible in Encounters.   Signed, Christell Faith, PA-C 10/08/2019 5:10 PM     [address], [place] [address] [phone]

## 2019-10-09 ENCOUNTER — Ambulatory Visit: Payer: Medicaid Other | Admitting: Physician Assistant

## 2019-10-09 NOTE — Progress Notes (Deleted)
       Patient: Penny Kim Female    DOB: [birth year]   49 y.o.   MRN: [id number] Visit Date: 10/09/2019  Today's Provider: Trinna Post, PA-C   No chief complaint on file.  Subjective:     HPI  Allergies  Allergen Reactions  . Prochlorperazine Other (See Comments)    SYNCOPE SYNCOPE  . Methadone Hcl Itching  . Other   . Tape Rash     Current Outpatient Medications:  .  amLODipine (NORVASC) 5 MG tablet, TAKE 1 TABLET BY MOUTH EVERY DAY, Disp: 30 tablet, Rfl: 0 .  clobetasol ointment (TEMOVATE) [id number] %, Apply 1 application topically 2 (two) times daily., Disp: 60 g, Rfl: 1 .  clonazePAM (KLONOPIN) 1 MG tablet, Take 1 mg by mouth 4 (four) times daily., Disp: , Rfl: 3 .  fluticasone (FLONASE) 50 MCG/ACT nasal spray, SPRAY 2 SPRAYS INTO EACH NOSTRIL EVERY DAY, Disp: 48 mL, Rfl: 0 .  gabapentin (NEURONTIN) 300 MG capsule, TAKE 1 CAPSULE BY MOUTH FOUR TIMES A DAY, Disp: , Rfl: 4 .  hydrochlorothiazide (MICROZIDE) 12.5 MG capsule, Take 1 capsule (12.5 mg total) by mouth daily. Please call to schedule appointment for further refills. Thank you!, Disp: 15 capsule, Rfl: 0 .  tamoxifen (NOLVADEX) 20 MG tablet, Take 1 tablet (20 mg total) by mouth daily., Disp: 90 tablet, Rfl: 4 .  vortioxetine HBr (TRINTELLIX) 20 MG TABS tablet, Take 20 mg by mouth daily., Disp: , Rfl:   Review of Systems  Social History   Tobacco Use  . Smoking status: Never Smoker  . Smokeless tobacco: Never Used  Substance Use Topics  . Alcohol use: No    Comment: occasionally      Objective:   There were no vitals taken for this visit. There were no vitals filed for this visit.There is no height or weight on file to calculate BMI.   Physical Exam   No results found for any visits on 10/10/19.     Assessment & [place], PA-C  [facility]

## 2019-10-10 ENCOUNTER — Ambulatory Visit: Payer: PRIVATE HEALTH INSURANCE | Admitting: Physician Assistant

## 2019-10-12 NOTE — Progress Notes (Deleted)
Cardiology Office Note    Date:  10/12/2019   ID:  SYARA ARMY, DOB [birth year], MRN [id number]  PCP:  Trinna Post, PA-C  Cardiologist:  Nelva Bush, MD  Electrophysiologist:  None   Chief Complaint: ***  History of Present Illness:   Penny Kim is a 49 y.o. female with history of syncope related to orthostatic hypotension, right-sided breast cancer status post bilateral mastectomies with reconstruction and chemotherapy in 09/2010, HTN, avascular necrosis of the bilateral hips, anxiety, and depression who presents for follow-up of hypertension.  Patient was previously evaluated by Dr. Farrel Conners in 07/2016 for syncope that dated back to 2012.  Episodes had been described as lightheadedness associated with positional changes and following using the restroom.  She was noted to be orthostatic in 07/2016.  At that time, her HCTZ was discontinued and amlodipine was decreased to 2.5 mg daily.  Echo was subsequently completed in 10/2016 which showed an EF of 50 to 55%, no R WMA, normal LV diastolic function, normal size left atrium, normal RV SF and PASP.  Nuclear stress test was subsequently completed in 11/2016 which showed a small defect of mild severity present in the apex location felt to be secondary to breast attenuation artifact, EF 55 to 65%, overall a normal study.  In follow-up in 08/2017 she was under increased stress and dealing with sinus/nasal congestion with taking of OTC Sudafed with her BP in the [id number] systolic.  It was recommended she discontinue Afrin/Sudafed and her amlodipine was titrated to 5 mg daily along with the resumption of HCTZ 12.5 mg daily.  She was last seen in the office in 06/2018 and was doing well from a cardiac perspective.  She continued to note significant nasal/sinus congestion, though was no longer using Sudafed and Afrin.  BP was overall improving when she was taking her medications.  She did note some peaks into the A999333 systolic when she was stressed over  financial issues.  BP at her visit that time was 120/60 with a heart rate of 90 bpm.  She was continued on amlodipine 5 mg daily along with HCTZ 12.5 mg daily.  She had not had any further episodes of orthostasis/syncope.  Most recent BP in Epic at her oncology follow-up which was 123/80 from 07/17/2019.  ***   Labs independently reviewed: 06/2019 - Hgb 12.4, PLT 274, potassium 3.6, BUN 8, serum creatinine 1.29, albumin 4.0, AST/ALT normal 08/2017 - direct LDL 124, TC 203, TG 90, HDL 73, LDL 112, TSH normal, magnesium 1.8   Past Medical History:  Diagnosis Date  . Allergy   . Anxiety   . Breast cancer, right breast ([place]) 02/2010   s/p chemo (completed 09/2010) and right mastectomy w/reconstruction  . Depression   . Family history of breast cancer   . Family history of lung cancer   . Family history of ovarian cancer   . History of stress test    a. treadmill Myoview 11/2016: small defect of mild severity present in the apex location felt to be secondary to breast attenuation. EF 55-65%. Normal study  . HTN (hypertension)   . Orthostatic hypotension   . Psoriasis   . Systolic dysfunction    a. TTE 10/2016: EF 50-55%, no RWMA, normal LV diastolic function, left atrium normal, RV systolic function normal, PASP normal    Past Surgical History:  Procedure Laterality Date  . BREAST RECONSTRUCTION  01/06/2012   Procedure: BREAST RECONSTRUCTION;  Surgeon: Crissie Reese, MD;  Location: [facility];  Service: Plastics;  Laterality: Bilateral;  bilateral removal of tissue expanders, bilateral placement of implants--Breast  . BREAST SURGERY  06/19/2010   exploration of right mastectomy site due to post-op bleeding  . INSERTION OF TISSUE EXPANDER AFTER MASTECTOMY  06/19/2010   bilat. mastectomies with bilat. tissue expanders  . PORT-A-CATH REMOVAL  11/26/2010  . PORTACATH PLACEMENT  07/23/2010  . TISSUE EXPANDER REMOVAL  10/20/2010   left    Current Medications: No outpatient  medications have been marked as taking for the 10/18/19 encounter (Appointment) with Rise Mu, PA-C.    Allergies:   Prochlorperazine, Methadone hcl, Other, and Tape   Social History   Socioeconomic History  . Marital status: Single    Spouse name: Not on file  . Number of children: Not on file  . Years of education: 13  . Highest education level: Not on file  Occupational History  . Occupation: Land    Comment: [employer]  Tobacco Use  . Smoking status: Never Smoker  . Smokeless tobacco: Never Used  Substance and Sexual Activity  . Alcohol use: No    Comment: occasionally  . Drug use: No  . Sexual activity: Not Currently    Birth control/protection: None  Other Topics Concern  . Not on file  Social History Narrative   Penny Kim grew up in rural [place], [place]. She attended [employer] where she obtained her Bachelors of Science degree in Psychology. 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 She currently lives in [place]. She lives alone. Karlina enjoys attending live musical performances. 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    Social Determinants of Health   Financial Resource Strain:   . Difficulty of Paying Living Expenses: Not on file  Food Insecurity:   . Worried About Charity fundraiser in the Last Year: Not on file  . Ran Out of Food in the Last Year: Not on file  Transportation Needs:   . Lack of Transportation (Medical): Not on file  . Lack of Transportation (Non-Medical): Not on file  Physical Activity:   . Days of Exercise per Week: Not on file  . Minutes of Exercise per Session: Not on file  Stress:   . Feeling of Stress : Not on file  Social Connections:   . Frequency of Communication with Friends and Family: Not on file  . Frequency of Social Gatherings with Friends and Family: Not on file  . Attends  Religious Services: Not on file  . Active Member of Clubs or Organizations: Not on file  . Attends Archivist Meetings: Not on file  . Marital Status: Not on file     Family History:  The patient's family history includes Alcohol abuse in her father; Breast cancer in her paternal grandmother; Cancer in her maternal grandfather; Cancer (age of onset: 29) in her sister; Depression in her father and mother; Diabetes in her maternal grandmother and paternal grandmother; Drug abuse (age of onset: 36) in her father; Heart attack in her mother; Hyperlipidemia in her mother and paternal grandmother; Hypertension in her maternal grandfather, maternal grandmother, mother, and paternal grandmother; Kidney disease in her paternal grandmother.  ROS:   ROS   EKGs/Labs/Other Studies Reviewed:    Studies reviewed were summarized above. The additional studies were reviewed today:  2D echo 10/2016: - Left ventricle: The cavity size was normal.  Systolic function was   normal. The estimated ejection fraction was in the range of 50%   to 55%. Wall motion was normal; there were no regional wall   motion abnormalities. Left ventricular diastolic function   parameters were normal. - Left atrium: The atrium was normal in size. - Right ventricle: Systolic function was normal. - Pulmonary arteries: Systolic pressure was within the normal   range.  Impressions:  - Sinus bradycardia noted.  EKG:  EKG is ordered today.  The EKG ordered today demonstrates ***  Recent Labs: 07/17/2019: ALT 16; BUN 8; Creatinine, Ser 1.29; Hemoglobin 12.4; Platelets 274; Potassium 3.6; Sodium 139  Recent Lipid Panel    Component Value Date/Time   CHOL 203 (H) 09/01/2017 1205   TRIG 90 09/01/2017 1205   HDL 73 09/01/2017 1205   CHOLHDL 4 08/06/2016 0846   VLDL 34.4 08/06/2016 0846   LDLCALC 112 (H) 09/01/2017 1205   LDLDIRECT 124 (H) 09/01/2017 1205    PHYSICAL EXAM:    VS:  There were no vitals taken for  this visit.  BMI: There is no height or weight on file to calculate BMI.  Physical Exam  Wt Readings from Last 3 Encounters:  07/17/19 150 lb 6.4 oz (68.2 kg)  10/25/18 136 lb 12.8 oz (62.1 kg)  06/27/18 128 lb (58.1 kg)     ASSESSMENT & PLAN:   1. ***  Disposition: F/u with Dr. Saunders Revel or an APP in ***.   Medication Adjustments/Labs and Tests Ordered: Current medicines are reviewed at length with the patient today.  Concerns regarding medicines are outlined above. Medication changes, Labs and Tests ordered today are summarized above and listed in the Patient Instructions accessible in Encounters.   SignedChristell Faith, PA-C 10/12/2019 11:03 AM     [place] [address] [place], [place] [address] [phone]

## 2019-10-17 ENCOUNTER — Ambulatory Visit: Payer: Self-pay | Admitting: Physician Assistant

## 2019-10-18 ENCOUNTER — Ambulatory Visit: Payer: Medicaid Other | Admitting: Physician Assistant

## 2019-10-19 ENCOUNTER — Telehealth (INDEPENDENT_AMBULATORY_CARE_PROVIDER_SITE_OTHER): Payer: PRIVATE HEALTH INSURANCE | Admitting: Physician Assistant

## 2019-10-19 DIAGNOSIS — Z5329 Procedure and treatment not carried out because of patient's decision for other reasons: Secondary | ICD-10-CM

## 2019-10-19 NOTE — Progress Notes (Signed)
No show

## 2019-10-22 ENCOUNTER — Other Ambulatory Visit: Payer: Self-pay | Admitting: Physician Assistant

## 2019-10-24 DIAGNOSIS — M87852 Other osteonecrosis, left femur: Secondary | ICD-10-CM | POA: Diagnosis not present

## 2019-10-24 DIAGNOSIS — M87851 Other osteonecrosis, right femur: Secondary | ICD-10-CM | POA: Diagnosis not present

## 2019-10-24 DIAGNOSIS — M545 Low back pain: Secondary | ICD-10-CM | POA: Diagnosis not present

## 2019-10-25 ENCOUNTER — Encounter: Payer: Self-pay | Admitting: Nurse Practitioner

## 2019-10-25 ENCOUNTER — Other Ambulatory Visit: Payer: Self-pay

## 2019-10-25 ENCOUNTER — Ambulatory Visit (INDEPENDENT_AMBULATORY_CARE_PROVIDER_SITE_OTHER): Payer: Medicaid Other | Admitting: Nurse Practitioner

## 2019-10-25 VITALS — BP 122/88 | HR 108 | Ht 64.5 in | Wt 147.0 lb

## 2019-10-25 DIAGNOSIS — I10 Essential (primary) hypertension: Secondary | ICD-10-CM

## 2019-10-25 DIAGNOSIS — I951 Orthostatic hypotension: Secondary | ICD-10-CM

## 2019-10-25 MED ORDER — HYDROCHLOROTHIAZIDE 12.5 MG PO CAPS: ORAL_CAPSULE | ORAL | 3 refills | Status: DC

## 2019-10-25 MED ORDER — AMLODIPINE BESYLATE 10 MG PO TABS: 10.0000 mg | ORAL_TABLET | Freq: Every day | ORAL | 3 refills | Status: DC

## 2019-10-25 NOTE — Progress Notes (Addendum)
Office Visit    Patient Name: Penny Kim Date of Encounter: 10/25/2019  Primary Care Provider:  Trinna Post, PA-C Primary Cardiologist:  Nelva Bush, MD  Chief Complaint    49 year old female with a history of orthostatic hypotension with syncope, hypertension, right-sided breast cancer status post bilateral mastectomies with reconstruction and chemotherapy in January 2012, anxiety, and depression, who presents for follow-up related to hypertension.  Past Medical History    Past Medical History:  Diagnosis Date  . Allergy   . Anxiety   . Breast cancer, right breast ([place]) 02/2010   s/p chemo (completed 09/2010) and right mastectomy w/reconstruction  . Depression   . Family history of breast cancer   . Family history of lung cancer   . Family history of ovarian cancer   . History of stress test    a. treadmill Myoview 11/2016: small defect of mild severity present in the apex location felt to be secondary to breast attenuation. EF 55-65%. Normal study  . HTN (hypertension)   . Orthostatic hypotension   . Psoriasis   . Systolic dysfunction    a. TTE 10/2016: EF 50-55%, no RWMA, normal LV diastolic function, left atrium normal, RV systolic function normal, PASP normal   Past Surgical History:  Procedure Laterality Date  . BREAST RECONSTRUCTION  01/06/2012   Procedure: BREAST RECONSTRUCTION;  Surgeon: Crissie Reese, MD;  Location: [facility];  Service: Plastics;  Laterality: Bilateral;  bilateral removal of tissue expanders, bilateral placement of implants--Breast  . BREAST SURGERY  06/19/2010   exploration of right mastectomy site due to post-op bleeding  . INSERTION OF TISSUE EXPANDER AFTER MASTECTOMY  06/19/2010   bilat. mastectomies with bilat. tissue expanders  . PORT-A-CATH REMOVAL  11/26/2010  . PORTACATH PLACEMENT  07/23/2010  . TISSUE EXPANDER REMOVAL  10/20/2010   left    Allergies  Allergies  Allergen Reactions  . Prochlorperazine Other (See  Comments)    SYNCOPE SYNCOPE  . Methadone Hcl Itching  . Other   . Tape Rash    History of Present Illness    49 year old female with a history of orthostatic hypotension with syncope, hypertension, right-sided breast cancer status post bilateral mastectomies with reconstruction and chemotherapy (doxorubicin/cyclophosphamide, paclitaxel) in January 2012, anxiety, depression, and chronic pain.  She was previously evaluated in January 2017 in the setting of syncope.  Echocardiogram was performed in early 2018 showing normal LV function.  Stress testing was completed in March 2018 showing a small defect of mild severity in the apex felt to be secondary to breast attenuation.  No ischemia was noted.  Though blood pressure medications were down titrated in the setting of syncope and orthostatic lightheadedness in 2017, she has since required up titration.  She was last seen in cardiology clinic October 2019 at which time her blood pressure was stable.  She has been taking amlodipine 5 mg daily and HCTZ 12.5 mg daily.  She says that home blood pressures have typically been running in the 140s over high 80s.  She notes that she has a significant mental anxiety and has been somewhat scared to leave her home as result.  She also has chronic pain involving her chest, bilateral hips, and right lower leg.  She denies any angina, dyspnea, palpitations, PND, orthopnea, dizziness, syncope, edema, or early satiety.  Home Medications    Prior to Admission medications   Medication Sig Start Date End Date Taking? Authorizing Provider  amLODipine (NORVASC) 5 MG tablet TAKE  1 TABLET BY MOUTH EVERY DAY 09/27/19  Yes Dunn, Areta Haber, PA-C  Ascorbic Acid 500 MG CHEW Chew by mouth.   Yes [provider]  clobetasol ointment (TEMOVATE) [id number] % Apply 1 application topically 2 (two) times daily. 01/29/19  Yes Edrick Kins, DPM  clonazePAM (KLONOPIN) 1 MG tablet Take 1 mg by mouth 4 (four) times daily. 01/28/18  Yes  [provider]  fluticasone (FLONASE) 50 MCG/ACT nasal spray SPRAY 2 SPRAYS INTO EACH NOSTRIL EVERY DAY 05/21/19  Yes Pollak, Adriana M, PA-C  gabapentin (NEURONTIN) 300 MG capsule TAKE 1 CAPSULE BY MOUTH FOUR TIMES A DAY 01/13/18  Yes [provider]  hydrochlorothiazide (MICROZIDE) 12.5 MG capsule TAKE 1 CAPSULE BY MOUTH EVERY DAY 10/22/19  Yes Dunn, Areta Haber, PA-C  tamoxifen (NOLVADEX) 20 MG tablet Take 1 tablet (20 mg total) by mouth daily. 07/17/19  Yes Magrinat, Virgie Dad, MD  vortioxetine HBr (TRINTELLIX) 20 MG TABS tablet Take 20 mg by mouth daily.   Yes [provider]    Review of Systems    Chest wall, bilateral hip, and right lower leg pain.  Limited ambulation as a result.  She is on disability.  She notes significant anxiety.  She denies angina, dyspnea, palpitations, PND, orthopnea, dizziness, syncope, edema, or early satiety.  All other systems reviewed and are otherwise negative except as noted above.  Physical Exam    VS:  BP 122/88 (BP Location: Left Arm, Patient Position: Sitting, Cuff Size: Normal)   Pulse (!) 108   Ht 5' 4.5" (1.638 m)   Wt 147 lb (66.7 kg)   SpO2 98%   BMI 24.84 kg/m  , BMI Body mass index is 24.84 kg/m. GEN: Well nourished, well developed, in no acute distress. HEENT: normal. Neck: Supple, no JVD, carotid bruits, or masses. Cardiac: RRR, no murmurs, rubs, or gallops. No clubbing, cyanosis, edema.  Radials/PT 2+ and equal bilaterally.  Respiratory:  Respirations regular and unlabored, clear to auscultation bilaterally. GI: Soft, nontender, nondistended, BS + x 4. MS: no deformity or atrophy. Skin: warm and dry, no rash. Neuro:  Strength and sensation are intact. Psych: Normal affect.  Accessory Clinical Findings    ECG personally reviewed by me today -sinus tachycardia, 108, no acute ST or T changes- no acute changes.  Lab Results  Component Value Date   WBC 5.7 07/17/2019   HGB 12.4 07/17/2019   HCT 37.7 07/17/2019    MCV 85.7 07/17/2019   PLT 274 07/17/2019   Lab Results  Component Value Date   CREATININE 1.29 (H) 07/17/2019   BUN 8 07/17/2019   NA 139 07/17/2019   K 3.6 07/17/2019   CL 100 07/17/2019   CO2 29 07/17/2019   Lab Results  Component Value Date   ALT 16 07/17/2019   AST 24 07/17/2019   ALKPHOS 43 07/17/2019   BILITOT 0.4 07/17/2019   Lab Results  Component Value Date   CHOL 203 (H) 09/01/2017   HDL 73 09/01/2017   LDLCALC 112 (H) 09/01/2017   LDLDIRECT 124 (H) 09/01/2017   TRIG 90 09/01/2017   CHOLHDL 4 08/06/2016    Lab Results  Component Value Date   HGBA1C 5.9 08/06/2016    Assessment & Plan    1.  Essential hypertension/history of orthostatic hypotension: Blood pressure is actually fairly reasonable today at 122/88 however, she notes that at home she is typically running in the 140s over high 80s.  She is due for refills on her amlodipine  and HCTZ.  As creatinine was mildly elevated when last checked in October, I will repeat today.  I am going to increase her amlodipine to 10 mg daily given elevations at home.  She does have a prior history of orthostasis and syncope, I did advise that she change positions carefully and watch for symptoms that might result in is dropping back down to 5 mg daily.  2.  Anxiety: She notes significant anxiety which she feels drives her blood pressure.  Management per primary care.  3.  Sinus tachycardia: Patient says she was very anxious coming here today and thinks that that likely drove up her heart rate.  Rate was 108 on EKG but slowed by the time that I examined her.    4.  Disposition: Follow-up basic metabolic panel today.  Follow-up in clinic in 1 year or sooner if necessary.   Murray Hodgkins, NP 10/25/2019, 2:29 PM

## 2019-10-25 NOTE — Patient Instructions (Addendum)
Medication Instructions:  1- INCREASE Amlodipine Take 1 tablet (10 mg total) by mouth daily.  *If you need a refill on your cardiac medications before your next appointment, please call your pharmacy*  Lab Work: Your physician recommends that you have lab work today(BMET)  If you have labs (blood work) drawn today and your tests are completely normal, you will receive your results only by: Marland Kitchen MyChart Message (if you have MyChart) OR . A paper copy in the mail If you have any lab test that is abnormal or we need to change your treatment, we will call you to review the results.  Testing/Procedures: None ordered   Follow-Up: At [facility], you and your health needs are our priority.  As part of our continuing mission to provide you with exceptional heart care, we have created designated Provider Care Teams.  These Care Teams include your primary Cardiologist (physician) and Advanced Practice Providers (APPs -  Physician Assistants and Nurse Practitioners) who all work together to provide you with the care you need, when you need it.  Your next appointment:   12 month(s)  The format for your next appointment:   In Person  Provider:    You may see Nelva Bush, MD or Murray Hodgkins, NP.

## 2019-10-26 ENCOUNTER — Ambulatory Visit (INDEPENDENT_AMBULATORY_CARE_PROVIDER_SITE_OTHER): Payer: PRIVATE HEALTH INSURANCE | Admitting: Physician Assistant

## 2019-10-26 DIAGNOSIS — F331 Major depressive disorder, recurrent, moderate: Secondary | ICD-10-CM

## 2019-10-26 DIAGNOSIS — H539 Unspecified visual disturbance: Secondary | ICD-10-CM

## 2019-10-26 LAB — BASIC METABOLIC PANEL
BUN/Creatinine Ratio: 7 — ABNORMAL LOW (ref 9–23)
BUN: 8 mg/dL (ref 6–24)
CO2: 24 mmol/L (ref 20–29)
Calcium: 9.5 mg/dL (ref 8.7–10.2)
Chloride: 98 mmol/L (ref 96–106)
Creatinine, Ser: 1.16 mg/dL — ABNORMAL HIGH (ref 0.57–1.00)
GFR calc Af Amer: 64 mL/min/{1.73_m2} (ref >59)
GFR calc non Af Amer: 56 mL/min/{1.73_m2} — ABNORMAL LOW (ref >59)
Glucose: 101 mg/dL — ABNORMAL HIGH (ref 65–99)
Potassium: 3.7 mmol/L (ref 3.5–5.2)
Sodium: 139 mmol/L (ref 134–144)

## 2019-10-26 NOTE — Progress Notes (Signed)
Patient: Penny Kim Female    DOB: 06-28-1971   49 y.o.   MRN: [id number] Visit Date: 10/26/2019  Today's Provider: Trinna Post, PA-C   Chief Complaint  Patient presents with  . Follow-up   Subjective:    Virtual Visit via Telephone Note  I connected with Penny Kim on 10/26/19 at 11:20 AM EST by telephone and verified that I am speaking with the correct person using two identifiers.  Location: Patient: Home Provider: Office   I discussed the limitations, risks, security and privacy concerns of performing an evaluation and management service by telephone and the availability of in person appointments. I also discussed with the patient that there may be a patient responsible charge related to this service. The patient expressed understanding and agreed to proceed.  HPI  Follow-up Patient presents today virtually for possible psychiatrist referral. Patient states that the psychiatrist she is currently goes to is not accepting Medicaid and states that patient would have to pay one hundred dollars out of pocket to continue services. Patient states she is willing to do this however her psychiatrist told her that her PCP would have to fill her medications as the provider said she wasn't sure Medicaid would cover the medicines. Patient is treated for depression and anxiety.   Patient also reports that she is experiencing gradual vision loss and would like to see an eye doctor.    Allergies  Allergen Reactions  . Prochlorperazine Other (See Comments)    SYNCOPE SYNCOPE  . Methadone Hcl Itching  . Other   . Tape Rash     Current Outpatient Medications:  .  amLODipine (NORVASC) 10 MG tablet, Take 1 tablet (10 mg total) by mouth daily., Disp: 90 tablet, Rfl: 3 .  Ascorbic Acid 500 MG CHEW, Chew by mouth., Disp: , Rfl:  .  clobetasol ointment (TEMOVATE) [id number] %, Apply 1 application topically 2 (two) times daily., Disp: 60 g, Rfl: 1 .  clonazePAM (KLONOPIN) 1 MG  tablet, Take 1 mg by mouth 4 (four) times daily., Disp: , Rfl: 3 .  fluticasone (FLONASE) 50 MCG/ACT nasal spray, SPRAY 2 SPRAYS INTO EACH NOSTRIL EVERY DAY, Disp: 48 mL, Rfl: 0 .  gabapentin (NEURONTIN) 300 MG capsule, TAKE 1 CAPSULE BY MOUTH FOUR TIMES A DAY, Disp: , Rfl: 4 .  hydrochlorothiazide (MICROZIDE) 12.5 MG capsule, TAKE 1 CAPSULE BY MOUTH EVERY DAY, Disp: 90 capsule, Rfl: 3 .  tamoxifen (NOLVADEX) 20 MG tablet, Take 1 tablet (20 mg total) by mouth daily., Disp: 90 tablet, Rfl: 4 .  vortioxetine HBr (TRINTELLIX) 20 MG TABS tablet, Take 20 mg by mouth daily., Disp: , Rfl:   Review of Systems  Social History   Tobacco Use  . Smoking status: Never Smoker  . Smokeless tobacco: Never Used  Substance Use Topics  . Alcohol use: No    Comment: occasionally      Objective:   There were no vitals taken for this visit. There were no vitals filed for this visit.There is no height or weight on file to calculate BMI.   Physical Exam   No results found for any visits on 10/26/19.     Assessment & Plan    1. Major depressive disorder, recurrent episode, moderate ([place])  If patient would like to stay with her current psychiatrist, she should clarify what her provider means about prescribing. I am not willing to prescribe medications for issues I am not evaluating and I do  not think it is the case that Medicaid would not cover the medications. As she is open to switching providers, will place referral to provider accepting medicaid.   - Ambulatory referral to Psychiatry  2. Vision changes  - Ambulatory referral to Ophthalmology I discussed the assessment and treatment plan with the patient. The patient was provided an opportunity to ask questions and all were answered. The patient agreed with the plan and demonstrated an understanding of the instructions.   The patient was advised to call back or seek an in-person evaluation if the symptoms worsen or if the condition fails to  improve as anticipated.  I provided 25 minutes of non-face-to-face time during this encounter.   The entirety of the information documented in the History of Present Illness, Review of Systems and Physical Exam were personally obtained by me. Portions of this information were initially documented by [facility] and reviewed by me for thoroughness and accuracy.      Trinna Post, PA-C  [facility]

## 2019-11-02 DIAGNOSIS — Z20828 Contact with and (suspected) exposure to other viral communicable diseases: Secondary | ICD-10-CM | POA: Diagnosis not present

## 2019-11-02 DIAGNOSIS — M545 Low back pain: Secondary | ICD-10-CM | POA: Diagnosis not present

## 2019-11-02 DIAGNOSIS — M87852 Other osteonecrosis, left femur: Secondary | ICD-10-CM | POA: Diagnosis not present

## 2019-11-02 DIAGNOSIS — M87851 Other osteonecrosis, right femur: Secondary | ICD-10-CM | POA: Diagnosis not present

## 2019-11-17 ENCOUNTER — Emergency Department: Payer: Medicaid Other

## 2019-11-17 ENCOUNTER — Other Ambulatory Visit: Payer: Self-pay

## 2019-11-17 ENCOUNTER — Encounter: Payer: Self-pay | Admitting: *Deleted

## 2019-11-17 ENCOUNTER — Emergency Department
Admission: EM | Admit: 2019-11-17 | Discharge: 2019-11-17 | Disposition: A | Discharge status: Home / Self Care | Payer: Medicaid Other | Attending: Emergency Medicine | Admitting: Emergency Medicine

## 2019-11-17 DIAGNOSIS — M545 Low back pain: Secondary | ICD-10-CM | POA: Insufficient documentation

## 2019-11-17 DIAGNOSIS — W19XXXA Unspecified fall, initial encounter: Secondary | ICD-10-CM | POA: Diagnosis not present

## 2019-11-17 DIAGNOSIS — Y999 Unspecified external cause status: Secondary | ICD-10-CM | POA: Diagnosis not present

## 2019-11-17 DIAGNOSIS — Z79899 Other long term (current) drug therapy: Secondary | ICD-10-CM | POA: Insufficient documentation

## 2019-11-17 DIAGNOSIS — S79912A Unspecified injury of left hip, initial encounter: Secondary | ICD-10-CM | POA: Diagnosis not present

## 2019-11-17 DIAGNOSIS — Y9389 Activity, other specified: Secondary | ICD-10-CM | POA: Insufficient documentation

## 2019-11-17 DIAGNOSIS — M25552 Pain in left hip: Secondary | ICD-10-CM | POA: Diagnosis not present

## 2019-11-17 DIAGNOSIS — Z853 Personal history of malignant neoplasm of breast: Secondary | ICD-10-CM | POA: Insufficient documentation

## 2019-11-17 DIAGNOSIS — G8929 Other chronic pain: Secondary | ICD-10-CM | POA: Diagnosis not present

## 2019-11-17 DIAGNOSIS — M8709 Idiopathic aseptic necrosis of bone, multiple sites: Secondary | ICD-10-CM | POA: Insufficient documentation

## 2019-11-17 DIAGNOSIS — I10 Essential (primary) hypertension: Secondary | ICD-10-CM | POA: Insufficient documentation

## 2019-11-17 DIAGNOSIS — R52 Pain, unspecified: Secondary | ICD-10-CM | POA: Diagnosis not present

## 2019-11-17 DIAGNOSIS — Y92481 Parking lot as the place of occurrence of the external cause: Secondary | ICD-10-CM | POA: Diagnosis not present

## 2019-11-17 LAB — URINALYSIS, COMPLETE (UACMP) WITH MICROSCOPIC
Bilirubin Urine: NEGATIVE
Glucose, UA: NEGATIVE mg/dL
Hgb urine dipstick: NEGATIVE
Ketones, ur: NEGATIVE mg/dL
Leukocytes,Ua: NEGATIVE
Nitrite: NEGATIVE
Protein, ur: NEGATIVE mg/dL
Specific Gravity, Urine: 1.014 (ref 1.005–1.030)
pH: 6 (ref 5.0–8.0)

## 2019-11-17 LAB — PREGNANCY, URINE: Preg Test, Ur: NEGATIVE

## 2019-11-17 MED ORDER — TRAMADOL HCL 50 MG PO TABS: 50.0000 mg | ORAL_TABLET | Freq: Once | ORAL | Status: DC

## 2019-11-17 MED ORDER — TRAMADOL HCL 50 MG PO TABS: 50.0000 mg | ORAL_TABLET | Freq: Three times a day (TID) | ORAL | 0 refills | Status: DC | PRN

## 2019-11-17 MED ORDER — OXYCODONE-ACETAMINOPHEN 5-325 MG PO TABS
1.0000 | ORAL_TABLET | Freq: Once | ORAL | Status: AC
  Administered 2019-11-17: 19:00:00 1 via ORAL
  Filled 2019-11-17: qty 1

## 2019-11-17 NOTE — ED Triage Notes (Signed)
Per EMS report, patient has a history of bilateral hip pain, s/p chemo for bilateral mastectomy. Patient had recent fall and left hip pain is worse. Patient states pain increased today. Patient states she doesn't take pain meds at home.

## 2019-11-17 NOTE — ED Notes (Signed)
UA sent to lab

## 2019-11-17 NOTE — ED Notes (Signed)
Patient ambulated with walker and was steady except with turns. Patient c/o increased pain in left hip with weight-bearing and had a limping gait.

## 2019-11-17 NOTE — ED Provider Notes (Signed)
[facility] Emergency Department Provider Note  ____________________________________________  Time seen: Approximately 4:53 PM  I have reviewed the triage vital signs and the nursing notes.   HISTORY  Chief Complaint Hip Pain    HPI Penny Kim is a 49 y.o. female with PMH of avascular necrosis of bilateral hips, malignant breast cancer, hypertension that presents to the emergency department for evaluation of left hip pain chronically, worse today.  Pain radiates into her left leg.  Patient states that she fell a couple of weeks ago in [employer] parking lot.  She landed on her left hip at that time.  Today, left hip pain worsened when she got up and walked to the bathroom.  She took 2 Goody powders prior to coming to the emergency department and thinks it has been improving. She uses a walker at home for chronic pain. Had chemotherapy and bilateral mastectomy in 2012.  No fevers, abdominal pain, dysuria, urgency.  No weakness.   Past Medical History:  Diagnosis Date  . Allergy   . Anxiety   . Breast cancer, right breast ([place]) 02/2010   s/p chemo (completed 09/2010) and right mastectomy w/reconstruction  . Depression   . Family history of breast cancer   . Family history of lung cancer   . Family history of ovarian cancer   . History of stress test    a. treadmill Myoview 11/2016: small defect of mild severity present in the apex location felt to be secondary to breast attenuation. EF 55-65%. Normal study  . HTN (hypertension)   . Orthostatic hypotension   . Psoriasis   . Systolic dysfunction    a. TTE 10/2016: EF 50-55%, no RWMA, normal LV diastolic function, left atrium normal, RV systolic function normal, PASP normal    Patient Active Problem List   Diagnosis Date Noted  . Major depressive disorder, recurrent episode, moderate ([place]) 06/08/2019  . Family history of breast cancer   . Family history of lung cancer   . Family history of ovarian cancer   . Rash  10/19/2016  . Malignant neoplasm of upper-inner quadrant of right breast in female, estrogen receptor positive ([place]) 10/11/2016  . Prediabetes 08/06/2016  . Syncope 08/06/2016  . Genetic testing 10/07/2015  . Hyperlipidemia 09/11/2015  . Acquired absence of both breasts and nipples 04/22/2015  . Personal history of breast cancer 04/22/2015  . Avascular necrosis of bone ([place]) 11/22/2014  . Essential hypertension 07/30/2013  . Anxiety and depression 07/30/2013  . Breast cancer of upper-inner quadrant of right female breast ([place]) 08/24/2011    Past Surgical History:  Procedure Laterality Date  . BREAST RECONSTRUCTION  01/06/2012   Procedure: BREAST RECONSTRUCTION;  Surgeon: Crissie Reese, MD;  Location: [facility];  Service: Plastics;  Laterality: Bilateral;  bilateral removal of tissue expanders, bilateral placement of implants--Breast  . BREAST SURGERY  06/19/2010   exploration of right mastectomy site due to post-op bleeding  . INSERTION OF TISSUE EXPANDER AFTER MASTECTOMY  06/19/2010   bilat. mastectomies with bilat. tissue expanders  . PORT-A-CATH REMOVAL  11/26/2010  . PORTACATH PLACEMENT  07/23/2010  . TISSUE EXPANDER REMOVAL  10/20/2010   left    Prior to Admission medications   Medication Sig Start Date End Date Taking? Authorizing Provider  amLODipine (NORVASC) 10 MG tablet Take 1 tablet (10 mg total) by mouth daily. 10/25/19   Theora Gianotti, NP  Ascorbic Acid 500 MG CHEW Chew by mouth.    [provider]  clobetasol ointment (TEMOVATE) AB-123456789 % Apply 1 application topically 2 (two) times daily. 01/29/19   Edrick Kins, DPM  clonazePAM (KLONOPIN) 1 MG tablet Take 1 mg by mouth 4 (four) times daily. 01/28/18   [provider]  fluticasone (FLONASE) 50 MCG/ACT nasal spray SPRAY 2 SPRAYS INTO EACH NOSTRIL EVERY DAY 05/21/19   Trinna Post, PA-C  gabapentin (NEURONTIN) 300 MG capsule TAKE 1 CAPSULE BY MOUTH FOUR TIMES A DAY 01/13/18    [provider]  hydrochlorothiazide (MICROZIDE) 12.5 MG capsule TAKE 1 CAPSULE BY MOUTH EVERY DAY 10/25/19   Theora Gianotti, NP  tamoxifen (NOLVADEX) 20 MG tablet Take 1 tablet (20 mg total) by mouth daily. 07/17/19   Magrinat, Virgie Dad, MD  traMADol (ULTRAM) 50 MG tablet Take 1 tablet (50 mg total) by mouth every 8 (eight) hours as needed. 11/17/19 11/16/20  Laban Emperor, PA-C  vortioxetine HBr (TRINTELLIX) 20 MG TABS tablet Take 20 mg by mouth daily.    [provider]    Allergies Prochlorperazine, Methadone hcl, Other, and Tape  Family History  Problem Relation Age of Onset  . Hypertension Mother   . Depression Mother   . Heart attack Mother   . Hyperlipidemia Mother   . Cancer Sister 30       Ovarian cancer  . Hypertension Maternal Grandmother   . Diabetes Maternal Grandmother   . 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 Hypertension Maternal Grandfather   . Diabetes Paternal Grandmother   . Hypertension Paternal Grandmother   . Kidney disease Paternal Grandmother        End stage renal dz - dialysis  . Hyperlipidemia Paternal Grandmother   . Breast cancer Paternal Grandmother   . Alcohol abuse Father   . Depression Father   . Drug abuse Father 42       Died of drug overdose    Social History Social History   Tobacco Use  . Smoking status: Never Smoker  . Smokeless tobacco: Never Used  Substance Use Topics  . Alcohol use: No    Comment: occasionally  . Drug use: No     Review of Systems  Constitutional: No fever/chills Respiratory:No SOB. Gastrointestinal: No nausea, no vomiting.  Musculoskeletal: Positive for hip pain. Skin: Negative for rash, abrasions, lacerations, ecchymosis. Neurological: Negative for numbness or tingling   ____________________________________________   PHYSICAL EXAM:  VITAL SIGNS: ED Triage Vitals  Enc Vitals Group     BP 11/17/19 1612 (!) 150/66     Pulse Rate  11/17/19 1612 (!) 104     Resp 11/17/19 1612 18     Temp 11/17/19 1612 98.2 F (36.8 C)     Temp Source 11/17/19 1612 Oral     SpO2 11/17/19 1612 94 %     Weight 11/17/19 1612 145 lb (65.8 kg)     Height 11/17/19 1612 5\' 4"  (1.626 m)     Head Circumference --      Peak Flow --      Pain Score 11/17/19 1611 10     Pain Loc --      Pain Edu? --      Excl. in [place]? --      Constitutional: Alert and oriented. Well appearing and in no acute distress. Eyes: Conjunctivae are normal. PERRL. EOMI. Head: Atraumatic. ENT:      Ears:      Nose: No congestion/rhinnorhea.  Mouth/Throat: Mucous membranes are moist.  Neck: No stridor.   Cardiovascular: Normal rate, regular rhythm.  Good peripheral circulation. Respiratory: Normal respiratory effort without tachypnea or retractions. Lungs CTAB. Good air entry to the bases with no decreased or absent breath sounds. Gastrointestinal: Bowel sounds 4 quadrants. Soft and nontender to palpation. No guarding or rigidity. No palpable masses. No distention.  Musculoskeletal: Full range of motion to all extremities. No gross deformities appreciated.  No tenderness to palpation to lumbar spine.  Mild tenderness to palpation to left lumbar paraspinal muscles.  Tenderness to palpation to right trocanteric bursa. Full ROM of hip. Weight bearing.  Ambulatory with walker to bathroom without assistance. Neurologic:  Normal speech and language. No gross focal neurologic deficits are appreciated.  Skin:  Skin is warm, dry and intact. No rash noted. Psychiatric: Mood and affect are normal. Speech and behavior are normal. Patient exhibits appropriate insight and judgement.   ____________________________________________   LABS (all labs ordered are listed, but only abnormal results are displayed)  Labs Reviewed  URINALYSIS, COMPLETE (UACMP) WITH MICROSCOPIC - Abnormal; Notable for the following components:      Result Value   Color, Urine YELLOW (*)     APPearance CLEAR (*)    Bacteria, UA RARE (*)    All other components within normal limits  PREGNANCY, URINE   ____________________________________________  EKG   ____________________________________________  RADIOLOGY Penny Kim, personally viewed and evaluated these images (plain radiographs) as part of my medical decision making, as well as reviewing the written report by the radiologist.  DG Lumbar Spine 2-3 Views  Result Date: 11/17/2019 CLINICAL DATA:  Pain. EXAM: LUMBAR SPINE - 2-3 VIEW COMPARISON:  09/03/2014 FINDINGS: There is no acute displaced fracture. No dislocation. Mild multilevel degenerative changes are noted of the lumbar spine. IMPRESSION: Negative. Electronically Signed   By: Constance Holster M.D.   On: 11/17/2019 18:40   DG Hip Unilat W or Wo Pelvis 2-3 Views Left  Result Date: 11/17/2019 CLINICAL DATA:  49 year old female with fall and left hip pain. EXAM: DG HIP (WITH OR WITHOUT PELVIS) 2-3V LEFT COMPARISON:  Left hip radiograph dated 05/09/2017. FINDINGS: There is no acute fracture or dislocation. Mild degenerative changes of the hips. The soft tissues are unremarkable. IMPRESSION: No acute fracture or dislocation. Electronically Signed   By: Anner Crete M.D.   On: 11/17/2019 18:35    ____________________________________________    PROCEDURES  Procedure(s) performed:    Procedures    Medications  oxyCODONE-acetaminophen (PERCOCET/ROXICET) 5-325 MG per tablet 1 tablet (1 tablet Oral Given 11/17/19 1916)     ____________________________________________   INITIAL IMPRESSION / ASSESSMENT AND PLAN / ED COURSE  Pertinent labs & imaging results that were available during my care of the patient were reviewed by me and considered in my medical decision making (see chart for details).  Review of the [place] CSRS was performed in accordance of the [place] prior to dispensing any controlled drugs.   Patient presented to emergency department for  evaluation of acute on chronic left hip pain.  Vital signs and exam are reassuring.  Hip x-ray and lumbar x-ray are negative for acute bony abnormalities.  Given patient's history, CT was ordered to further evaluate.  Following Percocet, patient was up walking in the emergency department with a walker.  Her pain significantly improved.  She is bearing weight to her left hip.  She declines waiting any longer for the CT scan and is eager to go home.  She will return  to the emergency department if her pain worsens over the weekend.  She will follow up with primary care early next week for further evaluation.  Patient will be discharged home with prescriptions for a short course of tramadol. Patient is to follow up with primary care as directed. Patient is given ED precautions to return to the ED for any worsening or new symptoms.   LEILEEN PANDE was evaluated in Emergency Department on 11/17/2019 for the symptoms described in the history of present illness. She was evaluated in the context of the global COVID-19 pandemic, which necessitated consideration that the patient might be at risk for infection with the SARS-CoV-2 virus that causes COVID-19. Institutional protocols and algorithms that pertain to the evaluation of patients at risk for COVID-19 are in a state of rapid change based on information released by regulatory bodies including the CDC and federal and state organizations. These policies and algorithms were followed during the patient's care in the ED.   ____________________________________________  FINAL CLINICAL IMPRESSION(S) / ED DIAGNOSES  Final diagnoses:  Chronic left hip pain      NEW MEDICATIONS STARTED DURING THIS VISIT:  ED Discharge Orders         Ordered    traMADol (ULTRAM) 50 MG tablet  Every 8 hours PRN     11/17/19 2208              This chart was dictated using voice recognition software/Dragon. Despite best efforts to proofread, errors can occur which can  change the meaning. Any change was purely unintentional.    Laban Emperor, PA-C 11/17/19 2326    Drenda Freeze, MD 11/18/19 [phone]

## 2019-11-22 ENCOUNTER — Other Ambulatory Visit: Payer: Self-pay

## 2019-11-22 ENCOUNTER — Encounter: Payer: Self-pay | Admitting: Physician Assistant

## 2019-11-22 ENCOUNTER — Ambulatory Visit (INDEPENDENT_AMBULATORY_CARE_PROVIDER_SITE_OTHER): Payer: Medicaid Other | Admitting: Physician Assistant

## 2019-11-22 VITALS — BP 138/88 | HR 137 | Temp 97.1°F | Wt 153.8 lb

## 2019-11-22 DIAGNOSIS — J301 Allergic rhinitis due to pollen: Secondary | ICD-10-CM | POA: Diagnosis not present

## 2019-11-22 DIAGNOSIS — M25552 Pain in left hip: Secondary | ICD-10-CM

## 2019-11-22 DIAGNOSIS — M8700 Idiopathic aseptic necrosis of unspecified bone: Secondary | ICD-10-CM | POA: Diagnosis not present

## 2019-11-22 MED ORDER — METHOCARBAMOL 500 MG PO TABS: 500.0000 mg | ORAL_TABLET | Freq: Two times a day (BID) | ORAL | 0 refills | Status: DC | PRN

## 2019-11-22 MED ORDER — FLUTICASONE PROPIONATE 50 MCG/ACT NA SUSP: NASAL | 0 refills | Status: DC

## 2019-11-22 MED ORDER — MELOXICAM 7.5 MG PO TABS: 7.5000 mg | ORAL_TABLET | Freq: Every day | ORAL | 0 refills | Status: DC

## 2019-11-22 NOTE — Progress Notes (Signed)
Patient: Penny Kim Female    DOB: 19-Oct-1970   49 y.o.   MRN: [id number] Visit Date: 11/22/2019  Today's Provider: Trinna Post, PA-C   Chief Complaint  Patient presents with  . Hip Pain   Subjective:     Hip Pain  The incident occurred more than 1 week ago. There was no injury mechanism. The pain is present in the left hip and left leg. The quality of the pain is described as aching, shooting and stabbing. The pain is at a severity of 5/10 (9/10 per pt sometimes). The pain is moderate. Associated symptoms include an inability to bear weight and numbness. Pertinent negatives include no tingling. She has tried non-weight bearing, heat and rest for the symptoms. The treatment provided mild relief.   Reports several days ago she had severe left sided pain in her hip. She was seen in the ER and had a normal left hip xray other than mild degenerative disease. She has a history of bilateral AVN of her hips. She is followed by [facility] with Dr. Mack Guise. She is using a walker to ambulate which she reports she was doing. Two weeks prior to this incident she was already using a walker. She reports she was going to the bathroom or going to get water. XRAY of her lumbar spine and hips did not show fracture or dislocation on 11/17/2019. She was offered a CT scan in the ER but did not stay for the test. She says her left leg feels like it is attached by a thread and is just a big piece of meat hanging off.    Allergies  Allergen Reactions  . Prochlorperazine Other (See Comments)    SYNCOPE SYNCOPE  . Methadone Hcl Itching  . Other   . Tape Rash     Current Outpatient Medications:  .  amLODipine (NORVASC) 10 MG tablet, Take 1 tablet (10 mg total) by mouth daily., Disp: 90 tablet, Rfl: 3 .  Ascorbic Acid 500 MG CHEW, Chew by mouth., Disp: , Rfl:  .  clobetasol ointment (TEMOVATE) AB-123456789 %, Apply 1 application topically 2 (two) times daily., Disp: 60 g, Rfl: 1 .  clonazePAM  (KLONOPIN) 1 MG tablet, Take 1 mg by mouth 4 (four) times daily., Disp: , Rfl: 3 .  fluticasone (FLONASE) 50 MCG/ACT nasal spray, SPRAY 2 SPRAYS INTO EACH NOSTRIL EVERY DAY, Disp: 48 mL, Rfl: 0 .  gabapentin (NEURONTIN) 300 MG capsule, TAKE 1 CAPSULE BY MOUTH FOUR TIMES A DAY, Disp: , Rfl: 4 .  hydrochlorothiazide (MICROZIDE) 12.5 MG capsule, TAKE 1 CAPSULE BY MOUTH EVERY DAY, Disp: 90 capsule, Rfl: 3 .  tamoxifen (NOLVADEX) 20 MG tablet, Take 1 tablet (20 mg total) by mouth daily., Disp: 90 tablet, Rfl: 4 .  traMADol (ULTRAM) 50 MG tablet, Take 1 tablet (50 mg total) by mouth every 8 (eight) hours as needed., Disp: 6 tablet, Rfl: 0 .  vortioxetine HBr (TRINTELLIX) 20 MG TABS tablet, Take 20 mg by mouth daily., Disp: , Rfl:   Review of Systems  Neurological: Positive for numbness. Negative for tingling.    Social History   Tobacco Use  . Smoking status: Never Smoker  . Smokeless tobacco: Never Used  Substance Use Topics  . Alcohol use: No    Comment: occasionally      Objective:   BP 138/88 (BP Location: Right Arm, Patient Position: Sitting, Cuff Size: Normal)   Pulse (!) 137   Temp (!) 97.1  F (36.2 C) (Temporal)   Wt 153 lb 12.8 oz (69.8 kg)   LMP  (LMP Unknown)   SpO2 98%   BMI 26.40 kg/m  Vitals:   11/22/19 1357  BP: 138/88  Pulse: (!) 137  Temp: (!) 97.1 F (36.2 C)  TempSrc: Temporal  SpO2: 98%  Weight: 153 lb 12.8 oz (69.8 kg)  Body mass index is 26.4 kg/m.   Physical Exam Constitutional:      Appearance: Normal appearance.  Cardiovascular:     Rate and Rhythm: Normal rate.  Pulmonary:     Effort: Pulmonary effort is normal. No respiratory distress.  Skin:    General: Skin is warm and dry.  Neurological:     Mental Status: She is alert and oriented to person, place, and time. Mental status is at baseline.     Motor: Motor function is intact. No tremor, atrophy or abnormal muscle tone.     Coordination: Coordination is intact.     Gait: Gait abnormal.       Comments: Walking with a walker. Antalgic gait.       No results found for any visits on 11/22/19.     Assessment & Plan    1. Avascular necrosis of bone (HCC)  Treat as below. Counseled she needs to follow up with her orthopedist at [facility] for chronic management of this issue. Surgery has been deferred previously. Most recent MRI is from 2019. She may benefit from pain management referral. Patient reports she has an upcoming appointment with her orthopedist.   - meloxicam (MOBIC) 7.5 MG tablet; Take 1 tablet (7.5 mg total) by mouth daily.  Dispense: 30 tablet; Refill: 0 - methocarbamol (ROBAXIN) 500 MG tablet; Take 1 tablet (500 mg total) by mouth 2 (two) times daily as needed for muscle spasms.  Dispense: 30 tablet; Refill: 0  2. Left hip pain  - meloxicam (MOBIC) 7.5 MG tablet; Take 1 tablet (7.5 mg total) by mouth daily.  Dispense: 30 tablet; Refill: 0 - methocarbamol (ROBAXIN) 500 MG tablet; Take 1 tablet (500 mg total) by mouth 2 (two) times daily as needed for muscle spasms.  Dispense: 30 tablet; Refill: 0  3. Allergic rhinitis due to pollen, unspecified seasonality  - fluticasone (FLONASE) 50 MCG/ACT nasal spray; SPRAY 2 SPRAYS INTO EACH NOSTRIL EVERY DAY  Dispense: 48 mL; Refill: 0  The entirety of the information documented in the History of Present Illness, Review of Systems and Physical Exam were personally obtained by me. Portions of this information were initially documented by [facility] and reviewed by me for thoroughness and accuracy.       Trinna Post, PA-C  [facility]

## 2019-11-22 NOTE — Patient Instructions (Signed)
Hip Pain The hip is the joint between the upper legs and the lower pelvis. The bones, cartilage, tendons, and muscles of your hip joint support your body and allow you to move around. Hip pain can range from a minor ache to severe pain in one or both of your hips. The pain may be felt on the inside of the hip joint near the groin, or on the outside near the buttocks and upper thigh. You may also have swelling or stiffness in your hip area. Follow these instructions at home: Managing pain, stiffness, and swelling      If directed, put ice on the painful area. To do this: ? Put ice in a plastic bag. ? Place a towel between your skin and the bag. ? Leave the ice on for 20 minutes, 2-3 times a day.  If directed, apply heat to the affected area as often as told by your health care provider. Use the heat source that your health care provider recommends, such as a moist heat pack or a heating pad. ? Place a towel between your skin and the heat source. ? Leave the heat on for 20-30 minutes. ? Remove the heat if your skin turns bright red. This is especially important if you are unable to feel pain, heat, or cold. You may have a greater risk of getting burned. Activity  Do exercises as told by your health care provider.  Avoid activities that cause pain. General instructions   Take over-the-counter and prescription medicines only as told by your health care provider.  Keep a journal of your symptoms. Write down: ? How often you have hip pain. ? The location of your pain. ? What the pain feels like. ? What makes the pain worse.  Sleep with a pillow between your legs on your most comfortable side.  Keep all follow-up visits as told by your health care provider. This is important. Contact a health care provider if:  You cannot put weight on your leg.  Your pain or swelling continues or gets worse after one week.  It gets harder to walk.  You have a fever. Get help right away  if:  You fall.  You have a sudden increase in pain and swelling in your hip.  Your hip is red or swollen or very tender to touch. Summary  Hip pain can range from a minor ache to severe pain in one or both of your hips.  The pain may be felt on the inside of the hip joint near the groin, or on the outside near the buttocks and upper thigh.  Avoid activities that cause pain.  Write down how often you have hip pain, the location of the pain, what makes it worse, and what it feels like. This information is not intended to replace advice given to you by your health care provider. Make sure you discuss any questions you have with your health care provider. Document Revised: 01/22/2019 Document Reviewed: 01/22/2019 Elsevier Patient Education  2020 Elsevier Inc[employer] -- 

## 2019-11-23 ENCOUNTER — Encounter: Payer: Self-pay | Admitting: Emergency Medicine

## 2019-11-23 ENCOUNTER — Emergency Department: Payer: Medicaid Other

## 2019-11-23 ENCOUNTER — Other Ambulatory Visit: Payer: Self-pay

## 2019-11-23 ENCOUNTER — Encounter: Payer: Self-pay | Admitting: Physician Assistant

## 2019-11-23 ENCOUNTER — Telehealth: Payer: Self-pay | Admitting: Physician Assistant

## 2019-11-23 ENCOUNTER — Emergency Department
Admission: EM | Admit: 2019-11-23 | Discharge: 2019-11-23 | Disposition: A | Discharge status: Home / Self Care | Payer: Medicaid Other | Attending: Student in an Organized Health Care Education/Training Program | Admitting: Student in an Organized Health Care Education/Training Program

## 2019-11-23 ENCOUNTER — Telehealth: Payer: Self-pay

## 2019-11-23 DIAGNOSIS — R296 Repeated falls: Secondary | ICD-10-CM | POA: Diagnosis not present

## 2019-11-23 DIAGNOSIS — R519 Headache, unspecified: Secondary | ICD-10-CM | POA: Insufficient documentation

## 2019-11-23 DIAGNOSIS — Z5321 Procedure and treatment not carried out due to patient leaving prior to being seen by health care provider: Secondary | ICD-10-CM | POA: Diagnosis not present

## 2019-11-23 DIAGNOSIS — R200 Anesthesia of skin: Secondary | ICD-10-CM | POA: Insufficient documentation

## 2019-11-23 LAB — BASIC METABOLIC PANEL
Anion gap: 9 (ref 5–15)
BUN: 9 mg/dL (ref 6–20)
CO2: 27 mmol/L (ref 22–32)
Calcium: 9.4 mg/dL (ref 8.9–10.3)
Chloride: 102 mmol/L (ref 98–111)
Creatinine, Ser: 0.91 mg/dL (ref 0.44–1.00)
GFR calc Af Amer: >60 mL/min (ref >60)
GFR calc non Af Amer: >60 mL/min (ref >60)
Glucose, Bld: 115 mg/dL — ABNORMAL HIGH (ref 70–99)
Potassium: 3.2 mmol/L — ABNORMAL LOW (ref 3.5–5.1)
Sodium: 138 mmol/L (ref 135–145)

## 2019-11-23 LAB — CBC
HCT: 38.4 % (ref 36.0–46.0)
Hemoglobin: 12.5 g/dL (ref 12.0–15.0)
MCH: 27.5 pg (ref 26.0–34.0)
MCHC: 32.6 g/dL (ref 30.0–36.0)
MCV: 84.4 fL (ref 80.0–100.0)
Platelets: 323 10*3/uL (ref 150–400)
RBC: 4.55 MIL/uL (ref 3.87–5.11)
RDW: 14.4 % (ref 11.5–15.5)
WBC: 10.7 10*3/uL — ABNORMAL HIGH (ref 4.0–10.5)
nRBC: 0 % (ref 0.0–0.2)

## 2019-11-23 NOTE — Telephone Encounter (Signed)
Spoke with patient personally. I am unsure if this represents an episode of psychosis or CVA but it certainly is a departure from her baseline. I think she needs to be evaluated in the ED. I have offered to call an ambulance for her which she declines. She states her sister is on the way to take her to the ER.
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
## 2019-11-23 NOTE — ED Provider Notes (Signed)
Was walking over to evaluate patient she stood up stating that she was no longer going to wait in a hall.  Informed her that we could try to move her to a room and I that I could at least talk to her now, evaluate her and discuss her results.  States she was unwilling to wait any longer and did not want to speak with the provider. Patient ambulated with cane out of ER in no distress.  No facial droop.  No respiratory distress.    Merlyn Lot, MD 11/23/19 [phone]

## 2019-11-23 NOTE — ED Notes (Signed)
Pt to nurses station stating "fuck this shit, why am I in a hallway bed, I am in a hallway bed outside of this door and I am cold, I was here for eight hours on Saturday". Dr. Quentin Cornwall speaking with pt stating he will be right with her to be seen but pt refused to wait and exited through ambulance bay. Gait steady on departure.

## 2019-11-23 NOTE — ED Triage Notes (Signed)
FIRST NURSE NOTE- pt here for multiple falls, and headache.  In wheelchair. Reports her doctor wanted her to come get a CT scan.

## 2019-11-23 NOTE — ED Triage Notes (Signed)
Patient states, "I have avascular necrosis and I've been falling a lot".  Patient states she doesn't have feeling in her left calf since Saturday am and she has been falling onto her left side.  Patient is also complaining of a headache.  Patient states, "I have a lot of health problems."

## 2019-11-23 NOTE — Telephone Encounter (Signed)
Message was address via telephone encounter.

## 2019-11-26 ENCOUNTER — Telehealth: Payer: Self-pay

## 2019-11-26 NOTE — Telephone Encounter (Signed)
Copied from [place] [id number]. Topic: General - Other >> Nov 23, 2019  2:30 PM Celene Kras wrote: Reason for CRM: Pt called stating that she did have the tests done at the ED as she was advised by PCP. She states that she did leave without the results. Please advise.

## 2019-12-01 ENCOUNTER — Encounter: Payer: Self-pay | Admitting: Physician Assistant

## 2019-12-10 ENCOUNTER — Other Ambulatory Visit: Payer: Self-pay

## 2019-12-10 ENCOUNTER — Encounter: Payer: Self-pay | Admitting: Psychiatry

## 2019-12-10 ENCOUNTER — Encounter: Payer: Self-pay | Admitting: Physician Assistant

## 2019-12-10 ENCOUNTER — Ambulatory Visit (INDEPENDENT_AMBULATORY_CARE_PROVIDER_SITE_OTHER): Payer: Medicaid Other | Admitting: Psychiatry

## 2019-12-10 DIAGNOSIS — F429 Obsessive-compulsive disorder, unspecified: Secondary | ICD-10-CM

## 2019-12-10 DIAGNOSIS — F331 Major depressive disorder, recurrent, moderate: Secondary | ICD-10-CM | POA: Diagnosis not present

## 2019-12-10 DIAGNOSIS — F1320 Sedative, hypnotic or anxiolytic dependence, uncomplicated: Secondary | ICD-10-CM | POA: Insufficient documentation

## 2019-12-10 DIAGNOSIS — F4010 Social phobia, unspecified: Secondary | ICD-10-CM

## 2019-12-10 DIAGNOSIS — G4700 Insomnia, unspecified: Secondary | ICD-10-CM | POA: Diagnosis not present

## 2019-12-10 MED ORDER — PROPRANOLOL HCL 10 MG PO TABS: 10.0000 mg | ORAL_TABLET | Freq: Three times a day (TID) | ORAL | 1 refills | Status: DC | PRN

## 2019-12-10 MED ORDER — TRAZODONE HCL 50 MG PO TABS: 50.0000 mg | ORAL_TABLET | Freq: Every evening | ORAL | 1 refills | Status: DC | PRN
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
## 2019-12-24 ENCOUNTER — Other Ambulatory Visit: Payer: Self-pay | Admitting: Psychiatry

## 2019-12-24 DIAGNOSIS — F429 Obsessive-compulsive disorder, unspecified: Secondary | ICD-10-CM

## 2019-12-24 DIAGNOSIS — F331 Major depressive disorder, recurrent, moderate: Secondary | ICD-10-CM

## 2019-12-25 ENCOUNTER — Telehealth (HOSPITAL_COMMUNITY): Payer: Self-pay

## 2019-12-25 NOTE — Telephone Encounter (Signed)
Patient called requesting a refill on her Gabapentin 300mg  to be sent to [facility] on [address] in [place]. Thank you.

## 2019-12-26 DIAGNOSIS — M5416 Radiculopathy, lumbar region: Secondary | ICD-10-CM | POA: Diagnosis not present

## 2020-01-07 ENCOUNTER — Encounter: Payer: Self-pay | Admitting: Psychiatry

## 2020-01-07 ENCOUNTER — Telehealth: Payer: Self-pay | Admitting: Physician Assistant

## 2020-01-07 ENCOUNTER — Telehealth (INDEPENDENT_AMBULATORY_CARE_PROVIDER_SITE_OTHER): Payer: Medicaid Other | Admitting: Psychiatry

## 2020-01-07 ENCOUNTER — Other Ambulatory Visit: Payer: Self-pay

## 2020-01-07 DIAGNOSIS — F4010 Social phobia, unspecified: Secondary | ICD-10-CM | POA: Diagnosis not present

## 2020-01-07 DIAGNOSIS — F331 Major depressive disorder, recurrent, moderate: Secondary | ICD-10-CM | POA: Diagnosis not present

## 2020-01-07 DIAGNOSIS — F429 Obsessive-compulsive disorder, unspecified: Secondary | ICD-10-CM

## 2020-01-07 DIAGNOSIS — M87059 Idiopathic aseptic necrosis of unspecified femur: Secondary | ICD-10-CM

## 2020-01-07 DIAGNOSIS — G4700 Insomnia, unspecified: Secondary | ICD-10-CM

## 2020-01-07 DIAGNOSIS — Z853 Personal history of malignant neoplasm of breast: Secondary | ICD-10-CM

## 2020-01-07 DIAGNOSIS — M5416 Radiculopathy, lumbar region: Secondary | ICD-10-CM | POA: Diagnosis not present

## 2020-01-07 DIAGNOSIS — F1320 Sedative, hypnotic or anxiolytic dependence, uncomplicated: Secondary | ICD-10-CM

## 2020-01-07 MED ORDER — BUSPIRONE HCL 10 MG PO TABS: 10.0000 mg | ORAL_TABLET | Freq: Two times a day (BID) | ORAL | 1 refills | Status: DC

## 2020-01-07 MED ORDER — TRAZODONE HCL 50 MG PO TABS: 75.0000 mg | ORAL_TABLET | Freq: Every day | ORAL | 1 refills | Status: DC

## 2020-01-07 NOTE — Telephone Encounter (Signed)
Called patient and unable to LVM no vm set up to leave message. OK for PEC to Schedule.

## 2020-01-07 NOTE — Patient Instructions (Signed)
Follow-up in clinic May 18 at 9:30 AM

## 2020-01-07 NOTE — Telephone Encounter (Signed)
Can we please call to schedule patient so we can have visit to place order for home health aid and get her connected with our social worker? Thanks. I believe it can be telephone.

## 2020-01-07 NOTE — Progress Notes (Signed)
Provider Location : [facility] Patient Location : Home  Virtual Visit via Video Note  I connected with Penny Kim on 01/07/20 at 10:30 AM EDT by a video enabled telemedicine application and verified that I am speaking with the correct person using two identifiers.   I discussed the limitations of evaluation and management by telemedicine and the availability of in person appointments. The patient expressed understanding and agreed to proceed.    I discussed the assessment and treatment plan with the patient. The patient was provided an opportunity to ask questions and all were answered. The patient agreed with the plan and demonstrated an understanding of the instructions.   The patient was advised to call back or seek an in-person evaluation if the symptoms worsen or if the condition fails to improve as anticipated.  [facility] MD OP Progress Note  01/07/2020 12:00 PM Penny Kim  MRN:  [id number]  Chief Complaint:  Chief Complaint    Follow-up     HPI: Penny Kim is a 49 year old African-American female, single, lives in [place], has a history of multiple medical problems including avascular necrosis of bone, history of breast cancer currently on tamoxifen, history of bilateral mastectomy, left hip pain, allergic rhinitis, MDD, OCD, social anxiety disorder, insomnia, benzodiazepine dependence and history of PTSD was evaluated by telemedicine today.  Patient reports she continues to be in a lot of pain.  She reports she has physical therapy scheduled for this afternoon and she looks forward to that.  She reports the pain continues to affect her sleep.  She is currently on trazodone 50 mg which does not help much.  Patient reports mood wise she continues to struggle with sadness, crying spells as well as anxiety symptoms.  Patient is compliant on her Trintellix.  She reports she took herself off of the Klonopin completely and wants to get off of a lot of these medications if possible in the long  run.  She reports she is worried about her medications that can make her dependent on them.  She tried the propranolol however reports that does not help much with her anxiety attacks.  She continues to struggle with panic symptoms especially in social situations.  She has not been able to start psychotherapy sessions again.  She however agrees to get it done soon.  Patient denies any suicidality, homicidality.  She denies any perceptual disturbances.  She does report limited intake of food on and off more so because of her inability to do shopping by herself and her reluctance to depend on her social support system.  Patient became very tearful when she discussed this.  She however agrees to get in touch with her family members for assistance.  Discussed with patient recent abnormal labs in the system like her potassium level being low at 3.2 , she agrees to reach out to her primary care provider.  She may also need repeat TSH since it was last done a few years ago.   Visit Diagnosis:    ICD-10-CM   1. Moderate episode of recurrent major depressive disorder (HCC)  F33.1 traZODone (DESYREL) 50 MG tablet    busPIRone (BUSPAR) 10 MG tablet  2. Obsessive-compulsive disorder, unspecified type  F42.9 traZODone (DESYREL) 50 MG tablet    busPIRone (BUSPAR) 10 MG tablet  3. Social anxiety disorder  F40.10 traZODone (DESYREL) 50 MG tablet    busPIRone (BUSPAR) 10 MG tablet  4. Insomnia, unspecified type  G47.00   5. Benzodiazepine dependence (HCC)  F13.20  Past Psychiatric History: I have reviewed past psychiatric history from my progress note on 12/10/2019.  Past trials of Prozac, Paxil, Zoloft, Xanax, Ambien, melatonin.  Past Medical History:  Past Medical History:  Diagnosis Date  . Allergy   . Anxiety   . Breast cancer, right breast ([place]) 02/2010   s/p chemo (completed 09/2010) and right mastectomy w/reconstruction  . Depression   . Family history of breast cancer   . Family history of  lung cancer   . Family history of ovarian cancer   . History of stress test    a. treadmill Myoview 11/2016: small defect of mild severity present in the apex location felt to be secondary to breast attenuation. EF 55-65%. Normal study  . HTN (hypertension)   . Orthostatic hypotension   . Psoriasis   . Systolic dysfunction    a. TTE 10/2016: EF 50-55%, no RWMA, normal LV diastolic function, left atrium normal, RV systolic function normal, PASP normal    Past Surgical History:  Procedure Laterality Date  . BREAST RECONSTRUCTION  01/06/2012   Procedure: BREAST RECONSTRUCTION;  Surgeon: Crissie Reese, MD;  Location: [facility];  Service: Plastics;  Laterality: Bilateral;  bilateral removal of tissue expanders, bilateral placement of implants--Breast  . BREAST SURGERY  06/19/2010   exploration of right mastectomy site due to post-op bleeding  . INSERTION OF TISSUE EXPANDER AFTER MASTECTOMY  06/19/2010   bilat. mastectomies with bilat. tissue expanders  . PORT-A-CATH REMOVAL  11/26/2010  . PORTACATH PLACEMENT  07/23/2010  . TISSUE EXPANDER REMOVAL  10/20/2010   left    Family Psychiatric History: I have reviewed family psychiatric history from my progress note on 12/10/2019  Family History:  Family History  Problem Relation Age of Onset  . Hypertension Mother   . Depression Mother   . Heart attack Mother   . Hyperlipidemia Mother   . Cancer Sister 30       Ovarian cancer  . Hypertension Maternal Grandmother   . Diabetes Maternal Grandmother   . 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 Hypertension Maternal Grandfather   . Diabetes Paternal Grandmother   . Hypertension Paternal Grandmother   . Kidney disease Paternal Grandmother        End stage renal dz - dialysis  . Hyperlipidemia Paternal Grandmother   . Breast cancer Paternal Grandmother   . Alcohol abuse Father   . Depression Father   . Drug abuse Father 56       Died of drug  overdose    Social History: I have reviewed social history from my progress note on 12/10/2019 Social History   Socioeconomic History  . Marital status: Single    Spouse name: Not on file  . Number of children: Not on file  . Years of education: 60  . Highest education level: Not on file  Occupational History  . Occupation: Land    Comment: [employer]  Tobacco Use  . Smoking status: Never Smoker  . Smokeless tobacco: Never Used  Substance and Sexual Activity  . Alcohol use: No    Comment: occasionally  . Drug use: No  . Sexual activity: Not Currently    Birth control/protection: None  Other Topics Concern  . Not on file  Social History Narrative   Arzella grew [place] in [place], [place]. She attended [employer] where she obtained her Clarita Leber of  Science degree in Psychology. 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 She currently lives in [place]. She lives alone. Brianca enjoys attending live musical performances. 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    Social Determinants of Health   Financial Resource Strain:   . Difficulty of Paying Living Expenses:   Food Insecurity:   . Worried About Charity fundraiser in the Last Year:   . Arboriculturist in the Last Year:   Transportation Needs:   . Film/video editor (Medical):   Marland Kitchen Lack of Transportation (Non-Medical):   Physical Activity:   . Days of Exercise per Week:   . Minutes of Exercise per Session:   Stress:   . Feeling of Stress :   Social Connections:   . Frequency of Communication with Friends and Family:   . Frequency of Social Gatherings with Friends and Family:   . Attends Religious Services:   . Active Member of Clubs or Organizations:   . Attends Archivist Meetings:   Marland Kitchen Marital Status:     Allergies:  Allergies  Allergen Reactions  .  Prochlorperazine Other (See Comments)    SYNCOPE SYNCOPE  . Methadone Hcl Itching  . Other   . Propranolol     rash  . Tape Rash    Metabolic Disorder Labs: Lab Results  Component Value Date   HGBA1C 5.9 08/06/2016   No results found for: PROLACTIN Lab Results  Component Value Date   CHOL 203 (H) 09/01/2017   TRIG 90 09/01/2017   HDL 73 09/01/2017   CHOLHDL 4 08/06/2016   VLDL 34.4 08/06/2016   LDLCALC 112 (H) 09/01/2017   LDLCALC 154 (H) 08/06/2016   Lab Results  Component Value Date   TSH 2.780 09/01/2017   TSH 1.90 08/06/2016    Therapeutic Level Labs: No results found for: LITHIUM No results found for: VALPROATE No components found for:  CBMZ  Current Medications: Current Outpatient Medications  Medication Sig Dispense Refill  . amLODipine (NORVASC) 10 MG tablet Take 1 tablet (10 mg total) by mouth daily. 90 tablet 3  . Ascorbic Acid 500 MG CHEW Chew by mouth.    . busPIRone (BUSPAR) 10 MG tablet Take 1 tablet (10 mg total) by mouth 2 (two) times daily. 60 tablet 1  . clobetasol ointment (TEMOVATE) AB-123456789 % Apply 1 application topically 2 (two) times daily. 60 g 1  . cyclobenzaprine (FLEXERIL) 10 MG tablet Take 10 mg by mouth at bedtime.    . fluticasone (FLONASE) 50 MCG/ACT nasal spray SPRAY 2 SPRAYS INTO EACH NOSTRIL EVERY DAY 48 mL 0  . gabapentin (NEURONTIN) 300 MG capsule TAKE 1 CAPSULE BY MOUTH FOUR TIMES A DAY  4  . hydrochlorothiazide (MICROZIDE) 12.5 MG capsule TAKE 1 CAPSULE BY MOUTH EVERY DAY 90 capsule 3  . meloxicam (MOBIC) 7.5 MG tablet Take 1 tablet (7.5 mg total) by mouth daily. 30 tablet 0  . methocarbamol (ROBAXIN) 500 MG tablet Take 1 tablet (500 mg total) by mouth 2 (two) times daily as needed for muscle spasms. 30 tablet 0  . tamoxifen (NOLVADEX) 20 MG tablet Take 1 tablet (20 mg total) by mouth daily. 90 tablet 4  . traMADol (ULTRAM) 50 MG tablet Take 1 tablet (50 mg total) by mouth every 8 (eight) hours as needed. 6 tablet 0  . traZODone  (DESYREL) 50 MG tablet Take 1.5 tablets (75 mg total) by mouth  at bedtime. 45 tablet 1  . vortioxetine HBr (TRINTELLIX) 20 MG TABS tablet Take 20 mg by mouth daily.     No current facility-administered medications for this visit.     Musculoskeletal: Strength & Muscle Tone: UTA Gait & Station: Uses walker Patient leans: N/A  Psychiatric Specialty Exam: Review of Systems  Musculoskeletal: Positive for arthralgias.       Joint pain   Psychiatric/Behavioral: Positive for dysphoric mood and sleep disturbance. The patient is nervous/anxious.   All other systems reviewed and are negative.   There were no vitals taken for this visit.There is no height or weight on file to calculate BMI.  General Appearance: Casual  Eye Contact:  Fair  Speech:  Clear and Coherent  Volume:  Normal  Mood:  Depressed and Dysphoric  Affect:  Tearful  Thought Process:  Goal Directed and Descriptions of Associations: Intact  Orientation:  Full (Time, Place, and Person)  Thought Content: Logical   Suicidal Thoughts:  No  Homicidal Thoughts:  No  Memory:  Immediate;   Fair Recent;   Fair Remote;   Fair  Judgement:  Fair  Insight:  Fair  Psychomotor Activity:  Normal  Concentration:  Concentration: Fair and Attention Span: Fair  Recall:  [employer] of Knowledge: Fair  Language: Fair  Akathisia:  No  Handed:  Right  AIMS (if indicated): UTA  Assets:  Communication Skills Desire for Improvement Housing Social Support  ADL's:  Intact  Cognition: WNL  Sleep:  Poor   Screenings: PHQ2-9     Office Visit from 12/30/2017 in [facility] Visit from 09/22/2017 in [facility] Office Visit from 10/19/2016 in [facility] Office Visit from 08/27/2015 in [facility]  PHQ-2 Total Score  3  6  0  6  PHQ-9 Total Score  19  23  --  21       Assessment and Plan: Penny Kim is a 49 year old African-American female, never been married, on  disability, has a history of MDD, OCD, history of PTSD, social anxiety disorder, multiple medical problems including avascular necrosis, left hip pain, history of breast cancer status post bilateral mastectomy currently on tamoxifen was evaluated by telemedicine today.  Patient continues to struggle with mood lability, anxiety symptoms and sleep problems.  Her sleep problems are more so because of her pain also.  Patient will benefit from medication management and psychotherapy sessions.  Plan as noted below.  Plan MDD-stable Trintellix 20 mg p.o. daily  OCD-unspecified-unstable Patient advised to establish care with a therapist. Provided her information for therapist in [phone].  Patient also advised to contact [facility]. She will benefit from CBT.  Social anxiety disorder-unstable Propranolol 10 mg p.o. 3 times daily as needed for anxiety attacks Start BuSpar 10 mg p.o. twice daily  Insomnia-unstable-more so because of pain Increase trazodone to 75 mg p.o. nightly She will benefit from pain management.  Benzodiazepine dependence-in early remission Patient reports she has stopped taking the Klonopin. We will monitor closely  Pending reports from Dr. Warner Mccreedy to call her previous psychiatrist.  Patient will benefit from the following labs-TSH.  She will also reach out to her primary care provider about her most recent abnormal labs including low potassium level.  Discussed with patient interaction between antidepressant/mood stabilizers with tamoxifen.  Patient will benefit from community supporting since she has limited mobility as well as pain.  I have sent a message to her primary care provider.  Follow-up in  clinic in 3 to 4 weeks or sooner if needed.  I have spent atleast 30 minutes non face to face with patient today. More than 50 % of the time was spent for preparing to see the patient ( e.g., review of test, records ), obtaining and to review and  separately obtained history , ordering medications and test ,psychoeducation and supportive psychotherapy and care coordination,as well as documenting clinical information in electronic health record. This note was generated in part or whole with voice recognition software. Voice recognition is usually quite accurate but there are transcription errors that can and very often do occur. I apologize for any typographical errors that were not detected and corrected.      Ursula Alert, MD 01/07/2020, 12:00 PM

## 2020-01-09 ENCOUNTER — Telehealth: Payer: Self-pay

## 2020-01-09 NOTE — Telephone Encounter (Signed)
Copied from [place] [id number]. Topic: General - Call Back - No Documentation >> Jan 09, 2020  9:29 AM Erick Blinks wrote: Pt called reporting that Dr. Beulah Gandy wants her to follow up with PCP about Thyroid, Blood Sugar, Potassium, and a home health nurse. Please advise, pt declined initial offer to schedule appt.  Best contact: [phone]

## 2020-01-09 NOTE — Telephone Encounter (Signed)
An appointment for the patient has been scheduled.

## 2020-01-10 ENCOUNTER — Ambulatory Visit: Payer: Self-pay | Admitting: Physician Assistant

## 2020-01-11 ENCOUNTER — Ambulatory Visit: Payer: Self-pay | Admitting: Physician Assistant

## 2020-01-11 DIAGNOSIS — H524 Presbyopia: Secondary | ICD-10-CM | POA: Diagnosis not present

## 2020-01-14 DIAGNOSIS — H5213 Myopia, bilateral: Secondary | ICD-10-CM | POA: Diagnosis not present

## 2020-01-15 ENCOUNTER — Ambulatory Visit: Payer: Self-pay | Admitting: Physician Assistant

## 2020-01-22 ENCOUNTER — Ambulatory Visit: Payer: Self-pay | Admitting: Physician Assistant

## 2020-01-22 ENCOUNTER — Ambulatory Visit: Payer: Medicaid Other | Admitting: Physician Assistant

## 2020-01-22 DIAGNOSIS — M5416 Radiculopathy, lumbar region: Secondary | ICD-10-CM | POA: Diagnosis not present

## 2020-01-23 ENCOUNTER — Encounter: Payer: Self-pay | Admitting: Physician Assistant

## 2020-01-23 ENCOUNTER — Other Ambulatory Visit: Payer: Self-pay

## 2020-01-23 ENCOUNTER — Ambulatory Visit (INDEPENDENT_AMBULATORY_CARE_PROVIDER_SITE_OTHER): Payer: Medicaid Other | Admitting: Physician Assistant

## 2020-01-23 VITALS — BP 134/84 | HR 103 | Temp 97.3°F | Resp 18 | Wt 146.0 lb

## 2020-01-23 DIAGNOSIS — M25552 Pain in left hip: Secondary | ICD-10-CM | POA: Diagnosis not present

## 2020-01-23 DIAGNOSIS — Z170 Estrogen receptor positive status [ER+]: Secondary | ICD-10-CM | POA: Diagnosis not present

## 2020-01-23 DIAGNOSIS — M87059 Idiopathic aseptic necrosis of unspecified femur: Secondary | ICD-10-CM

## 2020-01-23 DIAGNOSIS — R7309 Other abnormal glucose: Secondary | ICD-10-CM | POA: Diagnosis not present

## 2020-01-23 DIAGNOSIS — R5383 Other fatigue: Secondary | ICD-10-CM

## 2020-01-23 DIAGNOSIS — M25559 Pain in unspecified hip: Secondary | ICD-10-CM | POA: Diagnosis not present

## 2020-01-23 DIAGNOSIS — C50211 Malignant neoplasm of upper-inner quadrant of right female breast: Secondary | ICD-10-CM | POA: Diagnosis not present

## 2020-01-23 NOTE — Progress Notes (Signed)
I,Roshena L Chambers,acting as a scribe for Trinna Post, PA-C.,have documented all relevant documentation on the behalf of Trinna Post, PA-C,as directed by  Trinna Post, PA-C while in the presence of Trinna Post, PA-C.   Established patient visit   Patient: Penny Kim   DOB: 1971-05-12   49 y.o. Female  MRN: [id number] Visit Date: 01/23/2020  Today's healthcare provider: Trinna Post, PA-C   Chief Complaint  Patient presents with  . Follow-up  . Extremity Weakness   Subjective    HPI Follow up: Patient comes in today requesting lab work. She states her psychiatrist advised her to follow up with her PCP for blood work and referral for home health nurse. Patient has difficulty climbing stairs and getting around in her home. She is unable to carry groceries into her home. She has difficulty doing laundry and cooking meals. She has had a fall within the past year down the steps in her apartment.    Chronic pain: Patient reports her pain is unbearable at times and she would like options to help with pain. She has bilateral AVN of the hip. [facility] has [facility] for [facility] for this. She has not been felt to be a surgical candidate. I had previously given her a small script of hydrocodone for hip pain and had extensive discussion about when and how to use them. Patient took these inappropriately over a course of several days for indications such as hip pain.      Medications: Outpatient Medications Prior to Visit  Medication Sig  . amLODipine (NORVASC) 10 MG tablet Take 1 tablet (10 mg total) by mouth daily.  . Ascorbic Acid 500 MG CHEW Chew by mouth.  . busPIRone (BUSPAR) 10 MG tablet Take 1 tablet (10 mg total) by mouth 2 (two) times daily.  . clobetasol ointment (TEMOVATE) AB-123456789 % Apply 1 application topically 2 (two) times daily.  . cyclobenzaprine (FLEXERIL) 10 MG tablet Take 10 mg by mouth at bedtime.  . fluticasone (FLONASE) 50 MCG/ACT nasal spray  SPRAY 2 SPRAYS INTO EACH NOSTRIL EVERY DAY  . gabapentin (NEURONTIN) 300 MG capsule TAKE 1 CAPSULE BY MOUTH FOUR TIMES A DAY  . hydrochlorothiazide (MICROZIDE) 12.5 MG capsule TAKE 1 CAPSULE BY MOUTH EVERY DAY  . tamoxifen (NOLVADEX) 20 MG tablet Take 1 tablet (20 mg total) by mouth daily.  . traMADol (ULTRAM) 50 MG tablet Take 1 tablet (50 mg total) by mouth every 8 (eight) hours as needed.  . traZODone (DESYREL) 50 MG tablet Take 1.5 tablets (75 mg total) by mouth at bedtime.  . vortioxetine HBr (TRINTELLIX) 20 MG TABS tablet Take 20 mg by mouth daily.  . meloxicam (MOBIC) 7.5 MG tablet Take 1 tablet (7.5 mg total) by mouth daily. (Patient not taking: Reported on 01/23/2020)  . methocarbamol (ROBAXIN) 500 MG tablet Take 1 tablet (500 mg total) by mouth 2 (two) times daily as needed for muscle spasms. (Patient not taking: Reported on 01/23/2020)   No facility-administered medications prior to visit.    Review of Systems  Constitutional: Negative for appetite change, chills, fatigue and fever.  Respiratory: Negative for chest tightness and shortness of breath.   Cardiovascular: Negative for chest pain and palpitations.  Gastrointestinal: Negative for abdominal pain, nausea and vomiting.  Musculoskeletal: Positive for arthralgias.  Neurological: Positive for weakness. Negative for dizziness.  Psychiatric/Behavioral: Positive for agitation. The patient is nervous/anxious.       Objective    BP 134/84 (  BP Location: Left Arm, Patient Position: Sitting)   Pulse (!) 103   Temp (!) 97.3 F (36.3 C) (Temporal)   Resp 18   Wt 146 lb (66.2 kg)   SpO2 96% Comment: room air  BMI 25.06 kg/m    Physical Exam Constitutional:      Appearance: Normal appearance.  Skin:    General: Skin is warm and dry.  Neurological:     Mental Status: She is alert and oriented to person, place, and time. Mental status is at baseline.  Psychiatric:        Mood and Affect: Mood normal.        Behavior:  Behavior normal.       No results found for any visits on 01/23/20.  Assessment & Plan     1. Hip pain Recommend looking into a home health aid service to help with laundry and cooking meals. - Ambulatory referral to Pain Clinic  2. Left hip pain - Ambulatory referral to Pain Clinic  3. Malignant neoplasm of upper-inner quadrant of right breast in female, estrogen receptor positive ([place]) - Ambulatory referral to Chronic Care Management Services - Ambulatory referral to [facility]  4. Avascular necrosis of bone of hip, unspecified laterality ([place]) - Ambulatory referral to Chronic Care Management Services - Ambulatory referral to Home Health  5. Fatigue, unspecified type - CBC with Differential/Platelet - Comprehensive Metabolic Panel (CMET) - TSH  I have spent 25 minutes with this patient, >50% of which was spent on counseling and coordination of care.      No follow-ups on file.      ITrinna Post, PA-C, have reviewed all documentation for this visit. The documentation on 01/23/20 for the exam, diagnosis, procedures, and orders are all accurate and complete.    [name]  [facility] [phone] (phone) [phone] (fax)  [place]

## 2020-01-24 LAB — COMPREHENSIVE METABOLIC PANEL
ALT: 15 IU/L (ref 0–32)
AST: 24 IU/L (ref 0–40)
Albumin/Globulin Ratio: 1.5 (ref 1.2–2.2)
Albumin: 4.7 g/dL (ref 3.8–4.8)
Alkaline Phosphatase: 48 IU/L (ref 39–117)
BUN/Creatinine Ratio: 9 (ref 9–23)
BUN: 9 mg/dL (ref 6–24)
Bilirubin Total: 0.3 mg/dL (ref 0.0–1.2)
CO2: 22 mmol/L (ref 20–29)
Calcium: 10 mg/dL (ref 8.7–10.2)
Chloride: 96 mmol/L (ref 96–106)
Creatinine, Ser: 1.05 mg/dL — ABNORMAL HIGH (ref 0.57–1.00)
GFR calc Af Amer: 73 mL/min/{1.73_m2} (ref >59)
GFR calc non Af Amer: 63 mL/min/{1.73_m2} (ref >59)
Globulin, Total: 3.1 g/dL (ref 1.5–4.5)
Glucose: 102 mg/dL — ABNORMAL HIGH (ref 65–99)
Potassium: 3.4 mmol/L — ABNORMAL LOW (ref 3.5–5.2)
Sodium: 136 mmol/L (ref 134–144)
Total Protein: 7.8 g/dL (ref 6.0–8.5)

## 2020-01-24 LAB — CBC WITH DIFFERENTIAL/PLATELET
Basophils Absolute: 0.1 10*3/uL (ref 0.0–0.2)
Basos: 1 %
EOS (ABSOLUTE): 0.3 10*3/uL (ref 0.0–0.4)
Eos: 4 %
Hematocrit: 34.5 % (ref 34.0–46.6)
Hemoglobin: 11.6 g/dL (ref 11.1–15.9)
Immature Grans (Abs): 0 10*3/uL (ref 0.0–0.1)
Immature Granulocytes: 0 %
Lymphocytes Absolute: 1.7 10*3/uL (ref 0.7–3.1)
Lymphs: 22 %
MCH: 27.2 pg (ref 26.6–33.0)
MCHC: 33.6 g/dL (ref 31.5–35.7)
MCV: 81 fL (ref 79–97)
Monocytes Absolute: 0.7 10*3/uL (ref 0.1–0.9)
Monocytes: 9 %
Neutrophils Absolute: 5.1 10*3/uL (ref 1.4–7.0)
Neutrophils: 64 %
Platelets: 291 10*3/uL (ref 150–450)
RBC: 4.26 x10E6/uL (ref 3.77–5.28)
RDW: 15 % (ref 11.7–15.4)
WBC: 7.9 10*3/uL (ref 3.4–10.8)

## 2020-01-24 LAB — TSH: TSH: 4.32 u[IU]/mL (ref 0.450–4.500)

## 2020-01-24 NOTE — Progress Notes (Signed)
Lab was added.

## 2020-01-26 LAB — HEMOGLOBIN A1C
Est. average glucose Bld gHb Est-mCnc: 123 mg/dL
Hgb A1c MFr Bld: 5.9 % — ABNORMAL HIGH (ref 4.8–5.6)

## 2020-01-26 LAB — SPECIMEN STATUS REPORT

## 2020-01-28 ENCOUNTER — Telehealth: Payer: Self-pay

## 2020-01-28 NOTE — Telephone Encounter (Signed)
-----   Message from Trinna Post, [place] sent at 01/28/2020  1:45 PM EDT ----- A1c is in prediabetic range, as it was three years ago. This is borderline diabetes and may develop into diabetes in the future. Recommend reducing sugars in foods.   Best, Carles Collet, PA-C

## 2020-01-28 NOTE — Telephone Encounter (Signed)
Patient was advised and states that she will exercise more and change eating habits. Patient states that she sorry for the way she came off on you at last visit. She says she believes in you and will do whatever you say to do. She said thank you for all that you do for her.

## 2020-01-29 ENCOUNTER — Ambulatory Visit: Payer: Self-pay | Admitting: *Deleted

## 2020-01-29 NOTE — Chronic Care Management (AMB) (Signed)
    Care Management   Unsuccessful Call Note 01/29/2020 Name: Penny Kim MRN: [id number] DOB: 03-06-1971  Patient is a 49 year old female who sees Carles Collet, [place] for primary care. Carles Collet, PA-C asked the CCM team to consult the patient for assistance with utilities.  Referral was placed 01/23/20. Patient's last office visit was 01/23/20.     This social worker was unable to reach patient via telephone today for initial call. I have left HIPAA compliant voicemail asking patient to return my call. (unsuccessful outreach #1).   Plan: Will follow-up within 7 business days via telephone.      Elliot Gurney, [place] Worker  [facility] Care Management [phone]

## 2020-02-01 ENCOUNTER — Other Ambulatory Visit: Payer: Self-pay | Admitting: Psychiatry

## 2020-02-01 ENCOUNTER — Ambulatory Visit: Payer: Self-pay | Admitting: *Deleted

## 2020-02-01 DIAGNOSIS — F429 Obsessive-compulsive disorder, unspecified: Secondary | ICD-10-CM

## 2020-02-01 DIAGNOSIS — F331 Major depressive disorder, recurrent, moderate: Secondary | ICD-10-CM

## 2020-02-01 DIAGNOSIS — F4010 Social phobia, unspecified: Secondary | ICD-10-CM

## 2020-02-01 NOTE — Chronic Care Management (AMB) (Signed)
    Care Management   Unsuccessful Call Note 02/01/2020 Name: TAMEKIA POTOSKY MRN: [id number] DOB: 1971-02-21  Patient  is a 49 year old female who sees Carles Collet, [place] for primary care. Carles Collet, PA-C asked the CCM team to consult the patient for community resources.  Referral was placed 01/23/20. Patient's last office visit was 01/23/20.     This social worker was unable to reach patient via telephone today for initial call. I have left HIPAA compliant voicemail asking patient to return my call. (unsuccessful outreach #2).   Plan: Will follow-up within 7 business days via telephone.      Elliot Gurney, [place] Worker  [facility] Care Management [phone]

## 2020-02-04 ENCOUNTER — Ambulatory Visit: Payer: Self-pay | Admitting: *Deleted

## 2020-02-04 NOTE — Chronic Care Management (AMB) (Signed)
   Care Management   Unsuccessful Call Note 02/04/2020 Name: Penny Kim MRN: [id number] DOB: Feb 04, 1971  Patient  is a 49 year old female who sees Carles Collet PA-C for primary care. Carles Collet, PA-C asked the CCM team to consult the patient for community resources.  Referral was placed 01/23/20. Patient's last office visit was 01/23/20.     This social worker was unable to reach patient via telephone today for initial call. I have left HIPAA compliant voicemail asking patient to return my call. (unsuccessful outreach #3).   Plan: This Education officer, museum will not make any additional outreach calls to patient, however will be happy to engage patient upon her return call.      Elliot Gurney, [place] Worker  [facility] Care Management [phone]

## 2020-02-05 ENCOUNTER — Telehealth (INDEPENDENT_AMBULATORY_CARE_PROVIDER_SITE_OTHER): Payer: Medicaid Other | Admitting: Psychiatry

## 2020-02-05 ENCOUNTER — Other Ambulatory Visit: Payer: Self-pay

## 2020-02-05 DIAGNOSIS — Z91199 Patient's noncompliance with other medical treatment and regimen due to unspecified reason: Secondary | ICD-10-CM | POA: Insufficient documentation

## 2020-02-05 DIAGNOSIS — Z5329 Procedure and treatment not carried out because of patient's decision for other reasons: Secondary | ICD-10-CM

## 2020-02-05 NOTE — Progress Notes (Signed)
No response to my chart video invite, also texted and emailed link. Left voicemail message on her phone. No response.

## 2020-02-08 ENCOUNTER — Encounter: Payer: Self-pay | Admitting: Physician Assistant

## 2020-02-08 DIAGNOSIS — M5416 Radiculopathy, lumbar region: Secondary | ICD-10-CM | POA: Diagnosis not present

## 2020-02-08 DIAGNOSIS — H524 Presbyopia: Secondary | ICD-10-CM | POA: Diagnosis not present

## 2020-02-08 NOTE — Telephone Encounter (Signed)
Spoke with patient reports that she is feeling ok. She is able to drive herself. She reports that [facility] was the one that ordered the MRI for her hip. That she was going to emailed him about this. She stated that if she starts to feel like she is hurting a lot that she is going to drive her self to the ED. She also stated that she is going to send a more detailed email? To the provider and just want Dr. Terrilee Croak to know that she really appreciates her and that she is on top of her game. Advised patient to fo to the ED if she is not doing well from the falls or to call here to be evaluated. She stated "I will email the provider"

## 2020-02-08 NOTE — Telephone Encounter (Signed)
Can we call and triage patient? I don't know what she is referring to, I've never ordered an MRI. If she can't walk etc she should be seen in the ER. If she is having moderate pain after fall she can be seen here. Additionally pain management has been trying to contact her and has not been able to due to her not having a voicemail set up.

## 2020-02-14 ENCOUNTER — Other Ambulatory Visit: Payer: Self-pay | Admitting: Orthopedic Surgery

## 2020-02-14 ENCOUNTER — Other Ambulatory Visit (HOSPITAL_COMMUNITY): Payer: Self-pay | Admitting: Orthopedic Surgery

## 2020-02-14 DIAGNOSIS — M5416 Radiculopathy, lumbar region: Secondary | ICD-10-CM

## 2020-02-26 ENCOUNTER — Ambulatory Visit: Payer: Self-pay | Admitting: *Deleted

## 2020-02-26 NOTE — Patient Instructions (Signed)
Thank you allowing the Chronic Care Management Team to be a part of your care! It was a pleasure speaking with you today!  1. Please call this social worker with any questions or concerns regarding your community resource needs.  CCM (Chronic Care Management) Team   Neldon Labella  RN, BSN Nurse Care Coordinator  [phone]   [place], LCSW Clinical Social Worker [phone]  Goals Addressed            This Visit's Progress   . "I need rental assistance" (pt-stated)       [place] (see longitudinal plan of care for additional care plan information)  Current Barriers:  . Financial constraints related to currently being unemployed   Clinical Social Work Clinical Goal(s):  Marland Kitchen Over the next 90 days, patient will follow up with housing resources porivded* as directed by SW to identify potential affordable housing options  Interventions: . Inter-disciplinary care team collaboration (see longitudinal plan of care) . Patient interviewed and appropriate assessments performed . Patient discussed having trouble paying her rent and needing resources for affordable housing . [employer] [address] and local affordable housing options discussed . Patient confirmed applying for the [facility] and has completed an application for affordable housing in [place], however has not submitted the application. [employer] authority currently not accepting applications, however patient is open to going outside of the [place] area. . Discussed plan to send patient a list of affordable housing resources and will identify availabilities . Discussed plans with patient for ongoing care management follow up and provided patient with direct contact information for care management team . Collaborated with primary care provider re: request form for personal care services to assist with ADL's and IADL's due to hip pain and difficulty navigating the stairs to her  home.  Patient Self Care Activities:  . Patient verbalizes understanding of plan to request an in home aid . Unable to perform IADLs independently  Initial goal documentation         The patient verbalized understanding of instructions provided today and declined a print copy of patient instruction materials.   Telephone follow up appointment with care management team member scheduled for: 03/04/20

## 2020-02-26 NOTE — Chronic Care Management (AMB) (Signed)
Care Management    Clinical Social Work General Note  02/26/2020 Name: Penny Kim MRN: [id number] DOB: 06-08-1971  Penny Kim is a 49 y.o. year old female who is a primary care patient of Trinna Post, [place]. The CCM was consulted to assist the patient with [employer] .   Ms. Sklar was given information about  Care Management services today including:  1. CM service includes personalized support from designated clinical staff supervised by her physician, including individualized plan of care and coordination with other care providers 2. 24/7 contact phone numbers for assistance for urgent and routine care needs. 3. The patient may stop CCM services at any time (effective at the end of the month) by phone call to the office staff.   Patient agreed to services and verbal consent obtained.   Review of patient status, including review of consultants reports, relevant laboratory and other test results, and collaboration with appropriate care team members and the patient's provider was performed as part of comprehensive patient evaluation and provision of chronic care management services.    SDOH (Social Determinants of Health) assessments and interventions performed:  No    Outpatient Encounter Medications as of 02/26/2020  Medication Sig  . amLODipine (NORVASC) 10 MG tablet Take 1 tablet (10 mg total) by mouth daily.  . Ascorbic Acid 500 MG CHEW Chew by mouth.  . busPIRone (BUSPAR) 10 MG tablet TAKE 1 TABLET BY MOUTH TWICE A DAY  . clobetasol ointment (TEMOVATE) 4.09 % Apply 1 application topically 2 (two) times daily.  . cyclobenzaprine (FLEXERIL) 10 MG tablet Take 10 mg by mouth at bedtime.  . fluticasone (FLONASE) 50 MCG/ACT nasal spray SPRAY 2 SPRAYS INTO EACH NOSTRIL EVERY DAY  . gabapentin (NEURONTIN) 300 MG capsule TAKE 1 CAPSULE BY MOUTH FOUR TIMES A DAY  . hydrochlorothiazide (MICROZIDE) 12.5 MG capsule TAKE 1 CAPSULE BY MOUTH EVERY DAY  . meloxicam (MOBIC) 7.5 MG  tablet Take 1 tablet (7.5 mg total) by mouth daily. (Patient not taking: Reported on 01/23/2020)  . methocarbamol (ROBAXIN) 500 MG tablet Take 1 tablet (500 mg total) by mouth 2 (two) times daily as needed for muscle spasms. (Patient not taking: Reported on 01/23/2020)  . tamoxifen (NOLVADEX) 20 MG tablet Take 1 tablet (20 mg total) by mouth daily.  . traMADol (ULTRAM) 50 MG tablet Take 1 tablet (50 mg total) by mouth every 8 (eight) hours as needed.  . traZODone (DESYREL) 50 MG tablet TAKE 1.5 TABLETS (75 MG TOTAL) BY MOUTH AT BEDTIME.  Marland Kitchen vortioxetine HBr (TRINTELLIX) 20 MG TABS tablet Take 20 mg by mouth daily.   No facility-administered encounter medications on file as of 02/26/2020.    Goals Addressed            This Visit's Progress   . "I need rental assistance" (pt-stated)       [place] (see longitudinal plan of care for additional care plan information)  Current Barriers:  . Financial constraints related to currently being unemployed   Clinical Social Work Clinical Goal(s):  Marland Kitchen Over the next 90 days, patient will follow up with housing resources porivded* as directed by SW to identify potential affordable housing options  Interventions: . Inter-disciplinary care team collaboration (see longitudinal plan of care) . Patient interviewed and appropriate assessments performed . Patient discussed having trouble paying her rent and needing resources for affordable housing . [employer] [address] and local affordable housing options discussed . Patient confirmed applying for  the [facility] and has completed an application for affordable housing in [place], however has not submitted the application. [employer] authority currently not accepting applications, however patient is open to going outside of the [place] area. . Discussed plan to send patient a list of affordable housing resources and will identify availabilities . Discussed plans with patient for  ongoing care management follow up and provided patient with direct contact information for care management team . Collaborated with primary care provider re: request form for personal care services to assist with ADL's and IADL's due to hip pain and difficulty navigating the stairs to her home.  Patient Self Care Activities:  . Patient verbalizes understanding of plan to request an in home aid . Unable to perform IADLs independently  Initial goal documentation         Follow Up Plan: SW will follow up with patient by phone over the next 7-14 business days        [place], [place] Worker  [place] Management [phone]

## 2020-02-28 ENCOUNTER — Other Ambulatory Visit: Payer: Self-pay | Admitting: Physician Assistant

## 2020-02-28 ENCOUNTER — Other Ambulatory Visit: Payer: Self-pay | Admitting: Podiatry

## 2020-02-28 DIAGNOSIS — M25552 Pain in left hip: Secondary | ICD-10-CM

## 2020-02-28 DIAGNOSIS — M8700 Idiopathic aseptic necrosis of unspecified bone: Secondary | ICD-10-CM

## 2020-02-28 NOTE — Telephone Encounter (Signed)
Requested medication (s) are due for refill today - unknown- acute Rx  Requested medication (s) are on the active medication list yes  Future visit scheduled -no  Last refill: 11/22/19  Notes to clinic: Request for non delegated Rx- reported not taking 01/23/20  Requested Prescriptions  Pending Prescriptions Disp Refills   methocarbamol (ROBAXIN) 500 MG tablet [Pharmacy Med Name: METHOCARBAMOL 500 MG TABLET] 30 tablet 0    Sig: Take 1 tablet (500 mg total) by mouth 2 (two) times daily as needed for muscle spasms.      Not Delegated - Analgesics:  Muscle Relaxants Failed - 02/28/2020 12:19 PM      Failed - This refill cannot be delegated      Passed - Valid encounter within last 6 months    Recent Outpatient Visits           1 month ago Hip pain   [place], [place], PA-C   3 months ago Avascular necrosis of bone [facility])   [place], [place], [place]   4 months ago Major depressive disorder, recurrent episode, moderate [facility])   [place], [place], [place]   4 months ago No-show for appointment   [place], PA-C   8 months ago Avascular necrosis of bone [facility])   [place], [place]                  Requested Prescriptions  Pending Prescriptions Disp Refills   methocarbamol (ROBAXIN) 500 MG tablet [Pharmacy Med Name: METHOCARBAMOL 500 MG TABLET] 30 tablet 0    Sig: Take 1 tablet (500 mg total) by mouth 2 (two) times daily as needed for muscle spasms.      Not Delegated - Analgesics:  Muscle Relaxants Failed - 02/28/2020 12:19 PM      Failed - This refill cannot be delegated      Passed - Valid encounter within last 6 months    Recent Outpatient Visits           1 month ago Hip pain   [place], [place], PA-C   3 months ago Avascular necrosis of bone [facility])   [place], [place], [place]    4 months ago Major depressive disorder, recurrent episode, moderate [facility])   [place], [place], [place]   4 months ago No-show for appointment   [place], PA-C   8 months ago Avascular necrosis of bone [facility])   [place], [place], [place]

## 2020-02-29 ENCOUNTER — Ambulatory Visit: Payer: Medicaid Other

## 2020-03-03 ENCOUNTER — Ambulatory Visit: Payer: Medicaid Other

## 2020-03-04 ENCOUNTER — Ambulatory Visit: Payer: Self-pay | Admitting: *Deleted

## 2020-03-04 DIAGNOSIS — M25552 Pain in left hip: Secondary | ICD-10-CM

## 2020-03-04 DIAGNOSIS — F331 Major depressive disorder, recurrent, moderate: Secondary | ICD-10-CM

## 2020-03-04 NOTE — Patient Instructions (Signed)
Thank you allowing the Chronic Care Management Team to be a part of your care! It was a pleasure speaking with you today!  1. Please anticipate a phone call from [facility] to schedule your evaluation for personal care services.  CCM (Chronic Care Management) Team   Neldon Labella RN, BSN Nurse Care Coordinator  [phone]  [place], LCSW Clinical Social Worker [phone]  Goals Addressed              This Visit's Progress     "I need help in the home" (pt-stated)        [place] (see longitudinal plan of care for additional care plan information)  Current Barriers:   ADL IADL limitations  Clinical Social Work Clinical Goal(s):   Over the next 90 days, patient will follow up with [facility] for personal care services* as directed by SW  Interventions:  Provided patient with information about request form received from patient's provider and faxed to [facility]  Discussed anticipating a phone call from [facility] who will schedule an evaluation for personal care services  Patient verbalized an understanding  Patient Self Care Activities:   Calls provider office for new concerns or questions  Unable to perform ADLs independently  Unable to perform IADLs independently  Initial goal documentation         The patient verbalized understanding of instructions provided today and declined a print copy of patient instruction materials.   Telephone follow up appointment with care management team member scheduled for:  03/18/20

## 2020-03-04 NOTE — Chronic Care Management (AMB) (Signed)
   Care Management    Clinical Social Work Follow Up Note  03/04/2020 Name: Penny Kim MRN: [id number] DOB: Nov 19, 1970  Penny Kim is a 49 y.o. year old female who is a primary care patient of Trinna Post, [place]. The CCM team was consulted for assistance with [employer] .   Review of patient status, including review of consultants reports, other relevant assessments, and collaboration with appropriate care team members and the patient's provider was performed as part of comprehensive patient evaluation and provision of chronic care management services.    SDOH (Social Determinants of Health) assessments performed: No    Outpatient Encounter Medications as of 03/04/2020  Medication Sig  . amLODipine (NORVASC) 10 MG tablet Take 1 tablet (10 mg total) by mouth daily.  . Ascorbic Acid 500 MG CHEW Chew by mouth.  . busPIRone (BUSPAR) 10 MG tablet TAKE 1 TABLET BY MOUTH TWICE A DAY  . clobetasol ointment (TEMOVATE) 3.01 % Apply 1 application topically 2 (two) times daily.  . cyclobenzaprine (FLEXERIL) 10 MG tablet Take 10 mg by mouth at bedtime.  . fluticasone (FLONASE) 50 MCG/ACT nasal spray SPRAY 2 SPRAYS INTO EACH NOSTRIL EVERY DAY  . gabapentin (NEURONTIN) 300 MG capsule TAKE 1 CAPSULE BY MOUTH FOUR TIMES A DAY  . hydrochlorothiazide (MICROZIDE) 12.5 MG capsule TAKE 1 CAPSULE BY MOUTH EVERY DAY  . meloxicam (MOBIC) 7.5 MG tablet TAKE 1 TABLET BY MOUTH EVERY DAY  . methocarbamol (ROBAXIN) 500 MG tablet TAKE 1 TABLET (500 MG TOTAL) BY MOUTH 2 (TWO) TIMES DAILY AS NEEDED FOR MUSCLE SPASMS.  . tamoxifen (NOLVADEX) 20 MG tablet Take 1 tablet (20 mg total) by mouth daily.  . traMADol (ULTRAM) 50 MG tablet Take 1 tablet (50 mg total) by mouth every 8 (eight) hours as needed.  . traZODone (DESYREL) 50 MG tablet TAKE 1.5 TABLETS (75 MG TOTAL) BY MOUTH AT BEDTIME.  Marland Kitchen vortioxetine HBr (TRINTELLIX) 20 MG TABS tablet Take 20 mg by mouth daily.   No facility-administered encounter  medications on file as of 03/04/2020.     Goals Addressed              This Visit's Progress   .  "I need help in the home" (pt-stated)        CARE PLAN ENTRY (see longitudinal plan of care for additional care plan information)  Current Barriers:  . ADL IADL limitations  Clinical Social Work Clinical Goal(s):  Marland Kitchen Over the next 90 days, patient will follow up with [facility] for personal care services* as directed by SW  Interventions: . Provided patient with information about request form received from patient's provider and faxed to [facility] . Discussed anticipating a phone call from [facility] who will schedule an evaluation for personal care services . Patient verbalized an understanding  Patient Self Care Activities:  . Calls provider office for new concerns or questions . Unable to perform ADLs independently . Unable to perform IADLs independently  Initial goal documentation         Follow Up Plan: SW will follow up with patient by phone over the next 7-15 business days    [place], [place] Worker  [place] Management [phone]

## 2020-03-08 ENCOUNTER — Ambulatory Visit: Admission: RE | Admit: 2020-03-08 | Payer: Medicaid Other | Source: Ambulatory Visit

## 2020-03-13 ENCOUNTER — Telehealth: Payer: Self-pay | Admitting: Nurse Practitioner

## 2020-03-13 NOTE — Telephone Encounter (Signed)
Received records request Alycia Patten, Attorney at [place], [facility], forwarded to [facility] for processing.

## 2020-03-14 ENCOUNTER — Encounter: Payer: Self-pay | Admitting: Oncology

## 2020-03-14 ENCOUNTER — Encounter: Payer: Self-pay | Admitting: Physician Assistant

## 2020-03-17 ENCOUNTER — Telehealth (INDEPENDENT_AMBULATORY_CARE_PROVIDER_SITE_OTHER): Payer: Medicaid Other | Admitting: Psychiatry

## 2020-03-17 ENCOUNTER — Other Ambulatory Visit: Payer: Self-pay

## 2020-03-17 ENCOUNTER — Encounter: Payer: Self-pay | Admitting: Psychiatry

## 2020-03-17 ENCOUNTER — Telehealth: Payer: Self-pay

## 2020-03-17 DIAGNOSIS — F429 Obsessive-compulsive disorder, unspecified: Secondary | ICD-10-CM

## 2020-03-17 DIAGNOSIS — G4701 Insomnia due to medical condition: Secondary | ICD-10-CM

## 2020-03-17 DIAGNOSIS — F4010 Social phobia, unspecified: Secondary | ICD-10-CM

## 2020-03-17 DIAGNOSIS — F331 Major depressive disorder, recurrent, moderate: Secondary | ICD-10-CM

## 2020-03-17 DIAGNOSIS — F1320 Sedative, hypnotic or anxiolytic dependence, uncomplicated: Secondary | ICD-10-CM

## 2020-03-17 MED ORDER — TRAZODONE HCL 100 MG PO TABS: 100.0000 mg | ORAL_TABLET | Freq: Every day | ORAL | 0 refills | Status: DC

## 2020-03-17 MED ORDER — CLONAZEPAM 0.5 MG PO TABS: 0.5000 mg | ORAL_TABLET | Freq: Every day | ORAL | 1 refills | Status: DC | PRN

## 2020-03-17 MED ORDER — BUSPIRONE HCL 10 MG PO TABS: 20.0000 mg | ORAL_TABLET | Freq: Two times a day (BID) | ORAL | 0 refills | Status: DC

## 2020-03-17 NOTE — Telephone Encounter (Signed)
Called  And notified patient of her 9:090 appt tomorrow. Patient will bring her history form and list of medications to  Her appt.

## 2020-03-17 NOTE — Progress Notes (Signed)
Provider Location : [facility] Patient Location : Home  Virtual Visit via Video Note  I connected with Penny Kim on 03/17/20 at 11:00 AM EDT by a video enabled telemedicine application and verified that I am speaking with the correct person using two identifiers.   I discussed the limitations of evaluation and management by telemedicine and the availability of in person appointments. The patient expressed understanding and agreed to proceed.     I discussed the assessment and treatment plan with the patient. The patient was provided an opportunity to ask questions and all were answered. The patient agreed with the plan and demonstrated an understanding of the instructions.   The patient was advised to call back or seek an in-person evaluation if the symptoms worsen or if the condition fails to improve as anticipated.   [facility] MD OP Progress Note  03/17/2020 12:31 PM Penny Kim  MRN:  [id number]  Chief Complaint:  Chief Complaint    Follow-up     HPI: Penny Kim is a 49 year old African-American female, single, lives in [place], has a history of multiple medical problems including avascular necrosis of bone, history of breast cancer currently on tamoxifen, history of bilateral mastectomy, left hip pain, allergic rhinitis, MDD, OCD, social anxiety disorder, insomnia, benzodiazepine dependence, history of PTSD was evaluated by telemedicine today.  Patient today appeared to be tearful in session.  She reports she has a lot of physical problems going on.  She is constantly in pain.  She reports she is unable to do things on her own including taking care of her day-to-day needs.  She reports she is currently working closely with her primary care provider to get access to more support at home.  She reports she currently struggles with sadness, crying spells, lack of motivation.  She also reports she is anxious and worried about everything.  She reports when she talks to her family members  she picks on herself to cope with the stress.  She reports even though she had stopped the Klonopin prior to our last visit she had to go back on it.  She reports she had a prescription pending from her previous psychiatrist.  She reports she was prescribed 120 pills however she has been taking only Klonopin 0.5 mg daily as needed.  She would like to gradually come off of it.  Patient reports the BuSpar does help however the current dosage is very mild for her.  She is interested in dosage increase.  Patient denies any suicidality, homicidality or perceptual disturbances.  Patient denies any other concerns today.  Visit Diagnosis:    ICD-10-CM   1. Moderate episode of recurrent major depressive disorder (HCC)  F33.1   2. Obsessive-compulsive disorder, unspecified type  F42.9 busPIRone (BUSPAR) 10 MG tablet    clonazePAM (KLONOPIN) 0.5 MG tablet  3. Social anxiety disorder  F40.10 busPIRone (BUSPAR) 10 MG tablet    clonazePAM (KLONOPIN) 0.5 MG tablet  4. Insomnia due to medical condition  G47.01 traZODone (DESYREL) 100 MG tablet   pain, anxiety  5. Benzodiazepine dependence ([place])  F13.20     Past Psychiatric History: I have reviewed past psychiatric history from my progress note on 12/10/2019.  Past trials of Prozac, Paxil, Zoloft, Xanax, Ambien, melatonin  Past Medical History:  Past Medical History:  Diagnosis Date  . Allergy   . Anxiety   . Breast cancer, right breast ([place]) 02/2010   s/p chemo (completed 09/2010) and right mastectomy w/reconstruction  . Depression   .  Family history of breast cancer   . Family history of lung cancer   . Family history of ovarian cancer   . History of stress test    a. treadmill Myoview 11/2016: small defect of mild severity present in the apex location felt to be secondary to breast attenuation. EF 55-65%. Normal study  . HTN (hypertension)   . Orthostatic hypotension   . Psoriasis   . Systolic dysfunction    a. TTE 10/2016: EF 50-55%, no RWMA,  normal LV diastolic function, left atrium normal, RV systolic function normal, PASP normal    Past Surgical History:  Procedure Laterality Date  . BREAST RECONSTRUCTION  01/06/2012   Procedure: BREAST RECONSTRUCTION;  Surgeon: Crissie Reese, MD;  Location: [facility];  Service: Plastics;  Laterality: Bilateral;  bilateral removal of tissue expanders, bilateral placement of implants--Breast  . BREAST SURGERY  06/19/2010   exploration of right mastectomy site due to post-op bleeding  . INSERTION OF TISSUE EXPANDER AFTER MASTECTOMY  06/19/2010   bilat. mastectomies with bilat. tissue expanders  . PORT-A-CATH REMOVAL  11/26/2010  . PORTACATH PLACEMENT  07/23/2010  . TISSUE EXPANDER REMOVAL  10/20/2010   left    Family Psychiatric History: I have reviewed family psychiatric history from my progress note on 12/10/2019  Family History:  Family History  Problem Relation Age of Onset  . Hypertension Mother   . Depression Mother   . Heart attack Mother   . Hyperlipidemia Mother   . Cancer Sister 30       Ovarian cancer  . Hypertension Maternal Grandmother   . Diabetes Maternal Grandmother   . 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 Hypertension Maternal Grandfather   . Diabetes Paternal Grandmother   . Hypertension Paternal Grandmother   . Kidney disease Paternal Grandmother        End stage renal dz - dialysis  . Hyperlipidemia Paternal Grandmother   . Breast cancer Paternal Grandmother   . Alcohol abuse Father   . Depression Father   . Drug abuse Father 85       Died of drug overdose    Social History: I have reviewed social history from my progress note on 12/10/2019 Social History   Socioeconomic History  . Marital status: Single    Spouse name: Not on file  . Number of children: Not on file  . Years of education: 56  . Highest education level: Not on file  Occupational History  . Occupation: Technical brewer    Comment: [employer]  Tobacco Use  . Smoking status: Never Smoker  . Smokeless tobacco: Never Used  Vaping Use  . Vaping Use: Never used  Substance and Sexual Activity  . Alcohol use: No    Comment: occasionally  . Drug use: No  . Sexual activity: Not Currently    Birth control/protection: None  Other Topics Concern  . Not on file  Social History Narrative   Shenita [place], [place]. She attended [employer] where she obtained her Bachelors of Science degree in Psychology. 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 She currently lives in [place]. She lives alone. [place] enjoys attending live musical performances. 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    Social Determinants of Health   Financial Resource Strain:   . Difficulty  of Paying Living Expenses:   Food Insecurity:   . Worried About Charity fundraiser in the Last Year:   . Arboriculturist in the Last Year:   Transportation Needs:   . Film/video editor (Medical):   Marland Kitchen Lack of Transportation (Non-Medical):   Physical Activity:   . Days of Exercise per Week:   . Minutes of Exercise per Session:   Stress:   . Feeling of Stress :   Social Connections:   . Frequency of Communication with Friends and Family:   . Frequency of Social Gatherings with Friends and Family:   . Attends Religious Services:   . Active Member of Clubs or Organizations:   . Attends Archivist Meetings:   Marland Kitchen Marital Status:     Allergies:  Allergies  Allergen Reactions  . Prochlorperazine Other (See Comments)    SYNCOPE SYNCOPE  . Methadone Hcl Itching  . Other   . Propranolol     rash  . Tape Rash    Metabolic Disorder Labs: Lab Results  Component Value Date   HGBA1C 5.9 (H) 01/23/2020   No results found for: PROLACTIN Lab Results  Component Value Date   CHOL 203 (H) 09/01/2017   TRIG 90  09/01/2017   HDL 73 09/01/2017   CHOLHDL 4 08/06/2016   VLDL 34.4 08/06/2016   LDLCALC 112 (H) 09/01/2017   LDLCALC 154 (H) 08/06/2016   Lab Results  Component Value Date   TSH 4.320 01/23/2020   TSH 2.780 09/01/2017    Therapeutic Level Labs: No results found for: LITHIUM No results found for: VALPROATE No components found for:  CBMZ  Current Medications: Current Outpatient Medications  Medication Sig Dispense Refill  . amLODipine (NORVASC) 10 MG tablet Take 1 tablet (10 mg total) by mouth daily. 90 tablet 3  . clobetasol ointment (TEMOVATE) 4.62 % Apply 1 application topically 2 (two) times daily. 60 g 1  . cyclobenzaprine (FLEXERIL) 10 MG tablet Take 10 mg by mouth at bedtime.    . fluticasone (FLONASE) 50 MCG/ACT nasal spray SPRAY 2 SPRAYS INTO EACH NOSTRIL EVERY DAY 48 mL 0  . hydrochlorothiazide (MICROZIDE) 12.5 MG capsule TAKE 1 CAPSULE BY MOUTH EVERY DAY 90 capsule 3  . tamoxifen (NOLVADEX) 20 MG tablet Take 1 tablet (20 mg total) by mouth daily. 90 tablet 4  . vortioxetine HBr (TRINTELLIX) 20 MG TABS tablet Take 20 mg by mouth daily.    . Ascorbic Acid 500 MG CHEW Chew by mouth. (Patient not taking: Reported on 03/17/2020)    . busPIRone (BUSPAR) 10 MG tablet Take 2 tablets (20 mg total) by mouth 2 (two) times daily. 360 tablet 0  . clonazePAM (KLONOPIN) 0.5 MG tablet Take 1 tablet (0.5 mg total) by mouth daily as needed for anxiety. For severe anxiety attacks only 28 tablet 1  . gabapentin (NEURONTIN) 300 MG capsule TAKE 1 CAPSULE BY MOUTH FOUR TIMES A DAY (Patient not taking: Reported on 03/17/2020)  4  . meloxicam (MOBIC) 7.5 MG tablet TAKE 1 TABLET BY MOUTH EVERY DAY (Patient not taking: Reported on 03/17/2020) 90 tablet 0  . methocarbamol (ROBAXIN) 500 MG tablet TAKE 1 TABLET (500 MG TOTAL) BY MOUTH 2 (TWO) TIMES DAILY AS NEEDED FOR MUSCLE SPASMS. (Patient not taking: Reported on 03/17/2020) 30 tablet 0  . traMADol (ULTRAM) 50 MG tablet Take 1 tablet (50 mg total) by mouth  every 8 (eight) hours as needed. (Patient not taking: Reported on 03/17/2020) 6 tablet 0  .  traZODone (DESYREL) 100 MG tablet Take 1 tablet (100 mg total) by mouth at bedtime. 90 tablet 0   No current facility-administered medications for this visit.     Musculoskeletal: Strength & Muscle Tone: UTA Gait & Station: Uses walker Patient leans: N/A  Psychiatric Specialty Exam: Review of Systems  Musculoskeletal: Positive for back pain and myalgias.  Psychiatric/Behavioral: Positive for dysphoric mood and sleep disturbance. The patient is nervous/anxious.   All other systems reviewed and are negative.   There were no vitals taken for this visit.There is no height or weight on file to calculate BMI.  General Appearance: Casual  Eye Contact:  Fair  Speech:  Clear and Coherent  Volume:  Normal  Mood:  Anxious and Depressed  Affect:  Tearful  Thought Process:  Goal Directed and Descriptions of Associations: Intact  Orientation:  Full (Time, Place, and Person)  Thought Content: Logical   Suicidal Thoughts:  No  Homicidal Thoughts:  No  Memory:  Immediate;   Fair Recent;   Fair Remote;   Fair  Judgement:  Fair  Insight:  Fair  Psychomotor Activity:  Normal  Concentration:  Concentration: Fair and Attention Span: Fair  Recall:  [employer] of Knowledge: Fair  Language: Fair  Akathisia:  No  Handed:  Right  AIMS (if indicated): UTA  Assets:  Communication Skills Desire for Improvement Social Support  ADL's:  Intact  Cognition: WNL  Sleep:  restless   Screenings: PHQ2-9     Office Visit from 12/30/2017 in [facility] Visit from 09/22/2017 in [facility] Visit from 10/19/2016 in [facility] Office Visit from 08/27/2015 in [facility]  PHQ-2 Total Score 3 6 0 6  PHQ-9 Total Score 19 23 -- 21       Assessment and Plan: AUBRINA NIEMAN is a 49 year old African-American female, never been married, has a  history of MDD, OCD, history of PTSD, social anxiety disorder, multiple medical problems including avascular necrosis, left hip pain, history of breast cancer status post bilateral mastectomy currently on tamoxifen was evaluated by telemedicine today.  Patient continues to struggle with anxiety, depressive symptoms as well as sleep issues.  Patient will benefit from medication management and referral for psychotherapy sessions.  Plan as noted below.  Plan MDD-unstable Trintellix 20 mg p.o. daily Increase BuSpar to 20 mg p.o. twice daily  OCD-unspecified-unstable Referral for CBT. She is also on medications as discussed above.  Social anxiety disorder-unstable Propranolol 10 mg p.o. 3 times daily as needed for anxiety attacks We will continue to wean off Klonopin, reduce Klonopin to 0.5 mg p.o. daily as needed for severe anxiety attacks only.  Discussed with patient to limit use.  Also discussed with patient that it will be gradually tapered down.  She is aware about the long-term risk of being on benzodiazepine therapy. Discussed with patient to make use of her psychotherapy sessions and let writer know if she is interested in intensive therapy referral like IOP.  Insomnia-unstable-multifactorial including pain Increase trazodone to 100 mg p.o. nightly  Benzodiazepine dependence-unstable Reduce Klonopin to 0.5 mg p.o. daily as needed. I have reviewed [place] controlled substance database. Long-term goal is to gradually taper her off of Klonopin.   Pending records from Dr. Toy Care.   I have reviewed labs-TSH-01/23/2020-within normal limits.  Follow-up in clinic in 4 weeks or sooner if needed.  I have spent atleast 20 minutes non face to face with patient today. More than 50 %  of the time was spent for preparing to see the patient ( e.g., review of test, records ), obtaining and to review and separately obtained history , ordering medications and test ,psychoeducation and supportive psychotherapy  and care coordination,as well as documenting clinical information in electronic health record,interpreting results of test and communication of results. This note was generated in part or whole with voice recognition software. Voice recognition is usually quite accurate but there are transcription errors that can and very often do occur. I apologize for any typographical errors that were not detected and corrected.        Ursula Alert, MD 03/17/2020, 12:31 PM

## 2020-03-18 ENCOUNTER — Other Ambulatory Visit: Payer: Self-pay | Admitting: Podiatry

## 2020-03-18 ENCOUNTER — Ambulatory Visit: Payer: Medicaid Other | Admitting: Student in an Organized Health Care Education/Training Program

## 2020-03-18 ENCOUNTER — Ambulatory Visit: Payer: Self-pay | Admitting: *Deleted

## 2020-03-18 DIAGNOSIS — Z170 Estrogen receptor positive status [ER+]: Secondary | ICD-10-CM

## 2020-03-18 DIAGNOSIS — M25552 Pain in left hip: Secondary | ICD-10-CM

## 2020-03-18 DIAGNOSIS — F331 Major depressive disorder, recurrent, moderate: Secondary | ICD-10-CM

## 2020-03-18 DIAGNOSIS — C50211 Malignant neoplasm of upper-inner quadrant of right female breast: Secondary | ICD-10-CM

## 2020-03-18 NOTE — Chronic Care Management (AMB) (Signed)
Chronic Care Management    Clinical Social Work Follow Up Note  03/18/2020 Name: Penny Kim MRN: [id number] DOB: 01/17/1971  Penny Kim is a 49 y.o. year old female who is a primary care patient of Trinna Post, [place]. The CCM team was consulted for assistance with [employer] .   Review of patient status, including review of consultants reports, other relevant assessments, and collaboration with appropriate care team members and the patient's provider was performed as part of comprehensive patient evaluation and provision of chronic care management services.    SDOH (Social Determinants of Health) assessments performed: No    Outpatient Encounter Medications as of 03/18/2020  Medication Sig  . amLODipine (NORVASC) 10 MG tablet Take 1 tablet (10 mg total) by mouth daily.  . Ascorbic Acid 500 MG CHEW Chew by mouth. (Patient not taking: Reported on 03/17/2020)  . busPIRone (BUSPAR) 10 MG tablet Take 2 tablets (20 mg total) by mouth 2 (two) times daily.  . clobetasol ointment (TEMOVATE) 3.54 % Apply 1 application topically 2 (two) times daily.  . clonazePAM (KLONOPIN) 0.5 MG tablet Take 1 tablet (0.5 mg total) by mouth daily as needed for anxiety. For severe anxiety attacks only  . cyclobenzaprine (FLEXERIL) 10 MG tablet Take 10 mg by mouth at bedtime.  . fluticasone (FLONASE) 50 MCG/ACT nasal spray SPRAY 2 SPRAYS INTO EACH NOSTRIL EVERY DAY  . gabapentin (NEURONTIN) 300 MG capsule TAKE 1 CAPSULE BY MOUTH FOUR TIMES A DAY (Patient not taking: Reported on 03/17/2020)  . hydrochlorothiazide (MICROZIDE) 12.5 MG capsule TAKE 1 CAPSULE BY MOUTH EVERY DAY  . meloxicam (MOBIC) 7.5 MG tablet TAKE 1 TABLET BY MOUTH EVERY DAY (Patient not taking: Reported on 03/17/2020)  . methocarbamol (ROBAXIN) 500 MG tablet TAKE 1 TABLET (500 MG TOTAL) BY MOUTH 2 (TWO) TIMES DAILY AS NEEDED FOR MUSCLE SPASMS. (Patient not taking: Reported on 03/17/2020)  . tamoxifen (NOLVADEX) 20 MG tablet Take 1 tablet  (20 mg total) by mouth daily.  . traMADol (ULTRAM) 50 MG tablet Take 1 tablet (50 mg total) by mouth every 8 (eight) hours as needed. (Patient not taking: Reported on 03/17/2020)  . traZODone (DESYREL) 100 MG tablet Take 1 tablet (100 mg total) by mouth at bedtime.  . vortioxetine HBr (TRINTELLIX) 20 MG TABS tablet Take 20 mg by mouth daily.   No facility-administered encounter medications on file as of 03/18/2020.     Goals Addressed              This Visit's Progress   .  "I need help in the home" (pt-stated)        CARE PLAN ENTRY (see longitudinal plan of care for additional care plan information)  Current Barriers:  . ADL IADL limitations  Clinical Social Work Clinical Goal(s):  Marland Kitchen Over the next 90 days, patient will follow up with [facility] for personal care services* as directed by SW  Interventions: . Followed up with [facility] in regards to request for personal care assistance for patient. . Patient confirmed not receiving any call  from [facility] to schedule evaluation for personal care services . Collaboration phone call to [facility] who stated that referrals now need to go through patient's managed care company-[facility] [phone] . Phone call to [facility] authorization request completed and will be faxed back to [facility]  Patient Self Care Activities:  . Calls provider office for new concerns or questions . Unable to perform ADLs  independently . Unable to perform IADLs independently  Please see past updates related to this goal by clicking on the "Past Updates" button in the selected goal          Follow Up Plan: SW will follow up with patient by phone over the next 7-14 business days    [place], [place] Worker  [facility] Care Management [phone]

## 2020-03-18 NOTE — Patient Instructions (Signed)
Thank you allowing the Chronic Care Management Team to be a part of your care! It was a pleasure speaking with you today!  Social worker to follow up on referral sent to [facility] for personal care services CCM (Chronic Care Management) Team   Neldon Labella RN, BSN Nurse Care Coordinator  [phone]  [place], LCSW Clinical Social Worker [phone]  Goals Addressed              This Visit's Progress   .  "I need help in the home" (pt-stated)        CARE PLAN ENTRY (see longitudinal plan of care for additional care plan information)  Current Barriers:  . ADL IADL limitations  Clinical Social Work Clinical Goal(s):  Marland Kitchen Over the next 90 days, patient will follow up with [facility] for personal care services* as directed by SW  Interventions: . Followed up with [facility] in regards to request for personal care assistance for patient. . Patient confirmed not receiving any call  from [facility] to schedule evaluation for personal care services . Collaboration phone call to [facility] who stated that referrals now need to go through patient's managed care company-[facility] [phone] . Phone call to [facility] authorization request completed and will be faxed back to [facility]  Patient Self Care Activities:  . Calls provider office for new concerns or questions . Unable to perform ADLs independently . Unable to perform IADLs independently  Please see past updates related to this goal by clicking on the "Past Updates" button in the selected goal          The patient verbalized understanding of instructions provided today and declined a print copy of patient instruction materials.   Telephone follow up appointment with care management team member scheduled for:  03/28/20

## 2020-03-20 ENCOUNTER — Ambulatory Visit (INDEPENDENT_AMBULATORY_CARE_PROVIDER_SITE_OTHER): Payer: Medicaid Other | Admitting: Licensed Clinical Social Worker

## 2020-03-20 ENCOUNTER — Other Ambulatory Visit: Payer: Self-pay

## 2020-03-20 DIAGNOSIS — F4010 Social phobia, unspecified: Secondary | ICD-10-CM | POA: Diagnosis not present

## 2020-03-20 DIAGNOSIS — F331 Major depressive disorder, recurrent, moderate: Secondary | ICD-10-CM

## 2020-03-20 DIAGNOSIS — F429 Obsessive-compulsive disorder, unspecified: Secondary | ICD-10-CM

## 2020-03-21 ENCOUNTER — Other Ambulatory Visit: Payer: Self-pay | Admitting: Podiatry

## 2020-03-21 ENCOUNTER — Telehealth: Payer: Self-pay | Admitting: *Deleted

## 2020-03-21 MED ORDER — CLOBETASOL PROPIONATE 0.05 % EX OINT: 1.0000 "application " | TOPICAL_OINTMENT | Freq: Two times a day (BID) | CUTANEOUS | 3 refills | Status: DC

## 2020-03-21 NOTE — Progress Notes (Signed)
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

## 2020-03-21 NOTE — Telephone Encounter (Signed)
"  I'm calling to get a refill of the Clebatasol Ointment.  The pharmacist told me to call you.  Dr. Amalia Hailey said he would give this to me for life.  [facility]."

## 2020-03-21 NOTE — Telephone Encounter (Signed)
Rx sent. - Dr. Latonga Ponder

## 2020-03-21 NOTE — Progress Notes (Addendum)
Patient Location: Home  Provider Location: Home Office   Virtual Visit via Video Note  I connected with Penny Kim on 03/20/20 at  2:00 PM EDT by a video enabled telemedicine application and verified that I am speaking with the correct person using two identifiers.   I discussed the limitations of evaluation and management by telemedicine and the availability of in person appointments. The patient expressed understanding and agreed to proceed.  Comprehensive Clinical Assessment (CCA) Note  03/21/2020 Penny Kim [id number]  Visit Diagnosis:      ICD-10-CM   1. Moderate episode of recurrent major depressive disorder (HCC)  F33.1   2. Obsessive-compulsive disorder, unspecified type  F42.9   3. Social anxiety disorder  F40.10       CCA Screening, Triage and Referral (STR) STR has been completed on paper by the patient.  (See scanned document in Chart Review)   CCA Biopsychosocial  Intake/Chief Complaint:  CCA Intake With Chief Complaint CCA Part Two Date: 03/20/20 CCA Part Two Time: 67 Chief Complaint/Presenting Problem: Pt presents as a 49 year old African-American female for assessment. Pt was referred by her psychiatrist Dr. Shea Evans and is seeking treatment for depression. Pt reported "I want to move forward and I have emotional and behavioral issues that are so strong that I can be taking like 8 different pills and haven't found the balance in the medicine. It is making my erratic behavior worse". Pt described these erratic behaviors as "shutting down, going ballistic, and crying". Patient's Currently Reported Symptoms/Problems: Depressed, fatigue, mood swings, lack of motivation, social isolation Individual's Strengths: Pt reported "I am living independent with exception of fact that I am on disability and medicaid". Individual's Preferences: Pt reported "be honest with me. As we progress, you will know me more. I have a degree in psychology, but have not been in the field since  2005. Be tough with me. I have to learn to monitor my behavior". Individual's Abilities: Pt has some insight into her illness and able to communicate needs. Pt hx of working in Clinical cytogeneticist. Type of Services Patient Feels Are Needed: Individual Therapy and continued Medication Management Initial Clinical Notes/Concerns: Pt reported hx of suicide attempt in 2012 by taking tylenoI and bendryl with alcohol. Pt reported this was one of the "darkest" times in her life after going through chemotherapy. Pt denied current SI, plan or intent and any other suicide attempts since then.Pt reported "when I am not eating right or not taking my medications I can get dark".  Mental Health Symptoms Depression:  Depression: Change in energy/activity, Fatigue, Irritability, Duration of symptoms greater than two weeks, Tearfulness, Difficulty Concentrating, Sleep (too much or little) (Not engaging in hygiene)  Mania:  Mania: None (Pt denied)  Anxiety:   Anxiety: Irritability, Restlessness, Worrying, Tension (Pt reported anxiety sxs also include "lump in throat and shakiness".)  Psychosis:  Psychosis: None  Trauma:  Trauma: N/A (Unclear and needs further assessment at this time)  Obsessions:  Obsessions: Cause anxiety, Disrupts routine/functioning  Compulsions:  Compulsions: Disrupts with routine/functioning, "Driven" to perform behaviors/acts (shoplifting and avoids stepping on lines)  Inattention:  Inattention: Avoids/dislikes activities that require focus, Disorganized, Poor follow-through on tasks (Pt reporte tendency of "zoning out" and family members tell her it looks like "sleeping with my eyes open".)  Hyperactivity/Impulsivity:  Hyperactivity/Impulsivity: Talks excessively  Oppositional/Defiant Behaviors:  Oppositional/Defiant Behaviors: Aggression towards people/animals, Angry, Temper (Pt reported times where she had become physical with other neighbors in her building.)  Emotional  Irregularity:  Emotional Irregularity: Intense/inappropriate anger, Intense/unstable relationships, Mood lability, Unstable self-image  Other Mood/Personality Symptoms:      Mental Status Exam Appearance and self-care  Stature:  Stature: Average  Weight:  Weight: Average weight  Clothing:  Clothing: Casual  Grooming:  Grooming: Normal  Cosmetic use:  Cosmetic Use: Age appropriate  Posture/gait:  Posture/Gait: Normal  Motor activity:  Motor Activity: Restless  Sensorium  Attention:  Attention: Inattentive  Concentration:  Concentration: Anxiety interferes, Focuses on irrelevancies, Scattered  Orientation:  Orientation: X5  Recall/memory:  Recall/Memory: Normal  Affect and Mood  Affect:  Affect: Anxious, Labile, Tearful  Mood:  Mood: Anxious, Depressed  Relating  Eye contact:  Eye Contact: Normal  Facial expression:  Facial Expression: Anxious, Sad  Attitude toward examiner:  Attitude Toward Examiner: Cooperative  Thought and Language  Speech flow: Speech Flow: Flight of Ideas, Pressured  Thought content:  Thought Content: Appropriate to Mood and Circumstances  Preoccupation:  Preoccupations: Ruminations  Hallucinations:  Hallucinations: None  Organization:     Transport planner of Knowledge:  Fund of Knowledge: Average  Intelligence:  Intelligence: Average  Abstraction:  Abstraction: Normal  Judgement:  Judgement: Poor  Reality Testing:  Reality Testing: Distorted  Insight:  Insight: Flashes of insight  Decision Making:  Decision Making: Impulsive  Social Functioning  Social Maturity:  Social Maturity: Isolates  Social Judgement:  Social Judgement: Normal  Stress  Stressors:  Stressors: Family conflict, Grief/losses, Museum/gallery curator, Transitions  Coping Ability:  Coping Ability: English as a second language teacher Deficits:  Skill Deficits: Sports coach, Communication  Supports:  Supports: Church, Recruitment consultant system, Family (Pt reported that she goes to church for fellowship, music,  and is intrigued by some of the sermons)     Religion: Religion/Spirituality Are You A Religious Person?: Yes (Pt reported "I was a devout christian as a youth and through my 25s. 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 I've played the [place], studied Photographer, [employer], and teaching myself to play the guitar and piano".  Exercise/Diet: Exercise/Diet Do You Exercise?: Yes (Physical therapy on stationary bike) What Type of Exercise Do You Do?: Bike How Many Times a Week Do You Exercise?: 1-3 times a week Have You Gained or Lost A Significant Amount of Weight in the Past Six Months?: No Do You Follow a Special Diet?: No Do You Have Any Trouble Sleeping?: Yes Explanation of Sleeping Difficulties: Pt reported issues sleeping due to pain on left side and medications she takes.   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 Has patient ever been in the [employer]?: No  Education: Education Is Patient Currently Attending School?: No Last Grade Completed: 18 Did Teacher, adult education From [employer]?: Yes Did Physicist, medical?: Yes What Type of College Degree Do you Have?: BA in Psychology Did You Attend [place]?: Yes What is Your Post Graduate Degree?: MBA Did You Have An Individualized Education Program (IIEP): No   CCA Family/Childhood History  Family and Relationship History: Family history Marital status: Single Are you sexually active?: No What is your sexual orientation?:  Pt  reported "I date the whole spectrum, girls and guys". Does patient have children?: No  Childhood History:  Childhood History By whom was/is the patient raised?: Mother, Grandparents Additional childhood history information: Father died when patient was young Description of patient's relationship with caregiver when they were a child: Pt reported "I was just kind of ignored". Patient's description of current relationship with people who raised him/her: Mother is deceased. How were you disciplined when you got in trouble as a child/adolescent?: Pt reported "grandmother would yell at me". Does patient have siblings?: Yes Number of Siblings: 1 Description of patient's current relationship with siblings: Pt has conflictual relationships with sister and most of her family members. Pt reported her sister has said things like "you just mental". Did patient suffer any verbal/emotional/physical/sexual abuse as a child?: No Did patient suffer from severe childhood neglect?: No Has patient ever been sexually abused/assaulted/raped as an adolescent or adult?:  (Unclear and needs further assessment at this time) Was the patient ever a victim of a crime or a disaster?:  (Unclear and needs further assessment at this time) Witnessed domestic violence?:  (Unclear and needs further assessment at this time) Has patient been affected by domestic violence as an adult?:  (Unclear and needs further assessment at this time)    CCA Substance Use  Alcohol/Drug Use:   Unclear and needs further assessment at this time                         ASAM's:  Six Dimensions of Multidimensional Assessment  Dimension 1:  Acute Intoxication and/or Withdrawal Potential:      Dimension 2:  Biomedical Conditions and Complications:      Dimension 3:  Emotional, Behavioral, or Cognitive Conditions and Complications:     Dimension 4:  Readiness to Change:     Dimension 5:  Relapse, Continued use, or Continued  Problem Potential:     Dimension 6:  Recovery/Living Environment:     ASAM Severity Score:    ASAM Recommended Level of Treatment:     Substance use Disorder (SUD)    Recommendations for Services/Supports/Treatments:  Individual Therapy Medication Management  Due to time constraints therapist was unable to complete trauma and substance abuse sections of the CCA as well as treatment plan. Therapist and patient have a follow up session scheduled 03/26/20 in which assessment will continue as well as review of treatment plan and goals.  DSM5 Diagnoses: Patient Active Problem List   Diagnosis Date Noted  . No-show for appointment 02/05/2020  . Social anxiety disorder 01/07/2020  . Benzodiazepine dependence ([place]) 12/10/2019  . Insomnia 12/10/2019  . Obsessive-compulsive disorder 12/10/2019  . Moderate episode of recurrent major depressive disorder ([place]) 06/08/2019  . Family history of breast cancer   . Family history of lung cancer   . Family history of ovarian cancer   . Rash 10/19/2016  . Malignant neoplasm of upper-inner quadrant of right breast in female, estrogen receptor positive ([place]) 10/11/2016  . Prediabetes 08/06/2016  . Syncope 08/06/2016  . Genetic testing 10/07/2015  . Hyperlipidemia 09/11/2015  . Acquired absence of both breasts and nipples 04/22/2015  . Personal history of breast cancer 04/22/2015  . Avascular necrosis of bone of hip ([place]) 11/22/2014  . Essential hypertension 07/30/2013  . Anxiety and depression 07/30/2013  . Breast cancer of upper-inner quadrant of right female breast ([place]) 08/24/2011    Follow Up Instructions:   I discussed the assessment with the patient. The patient  was provided an opportunity to ask questions and all were answered. The patient demonstrated an understanding of the instructions.   The patient was advised to call back or seek an in-person evaluation if the symptoms worsen or if the condition fails to improve as anticipated.  I  provided 60 minutes of non-face-to-face time during this encounter.   Tonyia Marschall Wynelle Link, LCSW, LCAS

## 2020-03-26 ENCOUNTER — Ambulatory Visit (INDEPENDENT_AMBULATORY_CARE_PROVIDER_SITE_OTHER): Payer: Medicaid Other | Admitting: Licensed Clinical Social Worker

## 2020-03-26 ENCOUNTER — Encounter: Payer: Self-pay | Admitting: Licensed Clinical Social Worker

## 2020-03-26 ENCOUNTER — Other Ambulatory Visit: Payer: Self-pay

## 2020-03-26 DIAGNOSIS — F331 Major depressive disorder, recurrent, moderate: Secondary | ICD-10-CM | POA: Diagnosis not present

## 2020-03-26 DIAGNOSIS — F4010 Social phobia, unspecified: Secondary | ICD-10-CM | POA: Diagnosis not present

## 2020-03-26 NOTE — Progress Notes (Signed)
Patient Location: Home  Provider Location: Home Office   Virtual Visit via Video Note  I connected with Penny Kim on 03/26/20 at  1:00 PM EDT by a video enabled telemedicine application and verified that I am speaking with the correct person using two identifiers.   I discussed the limitations of evaluation and management by telemedicine and the availability of in person appointments. The patient expressed understanding and agreed to proceed.  THERAPY PROGRESS NOTE  Session Time: 78 Minutes  Participation Level: Active  Behavioral Response: CasualAlertAnxious  Type of Therapy: Individual Therapy  Treatment Goals addressed: Anxiety  Interventions: CBT  Summary: Penny Kim is a 49 y.o. female who presents with depression and anxiety sxs. Pt provided more follow up information related to childhood hx and denied any substance abuse issues currently or in the past. Pt came up with goals for treatment to reduce anxiety and depression. Pt described a typical day and identified activities in the past she found enjoyable and would like to re-engage in. Pt agreed to schedule some of these activities and monitor anxiety sxs in between sessions.  Suicidal/Homicidal: No   Therapist Response: Therapist met with patient for first session since CCA. Therapist and patient continued to discuss patient's childhood hx and substance use to complete assessment. Therapist and patient reviewed treatment plan and goals. Pt in agreement. Therapist encouraged patient to identify daily routine on a good day vs bad day. Therapist discussed with patient pleasant activities list and trying out a routine between now and next session.  Plan: Return again in 1 week.  Diagnosis: Axis I: Bipolar, mixed, Major Depression, Recurrent severe and Social Anxiety    Axis II: N/A  Follow Up Instructions:    I discussed the assessment and treatment plan with the patient. The patient was provided an opportunity to  ask questions and all were answered. The patient agreed with the plan and demonstrated an understanding of the instructions.   The patient was advised to call back or seek an in-person evaluation if the symptoms worsen or if the condition fails to improve as anticipated.  I provided 60 minutes of non-face-to-face time during this encounter.   Masayuki Sakai Wynelle Link, LCSW, LCAS

## 2020-03-28 ENCOUNTER — Telehealth: Payer: Self-pay

## 2020-03-28 ENCOUNTER — Other Ambulatory Visit: Payer: Self-pay

## 2020-03-28 ENCOUNTER — Ambulatory Visit
Admission: RE | Admit: 2020-03-28 | Discharge: 2020-03-28 | Disposition: A | Discharge status: Home / Self Care | Payer: Medicaid Other | Source: Ambulatory Visit | Attending: Orthopedic Surgery | Admitting: Orthopedic Surgery

## 2020-03-28 DIAGNOSIS — M5416 Radiculopathy, lumbar region: Secondary | ICD-10-CM | POA: Diagnosis not present

## 2020-03-31 ENCOUNTER — Telehealth: Payer: Self-pay

## 2020-04-01 ENCOUNTER — Ambulatory Visit: Payer: Self-pay | Admitting: *Deleted

## 2020-04-01 ENCOUNTER — Telehealth: Payer: Self-pay

## 2020-04-01 ENCOUNTER — Encounter: Payer: Self-pay | Admitting: Physician Assistant

## 2020-04-01 NOTE — Chronic Care Management (AMB) (Signed)
  Chronic Care Management   Social Work Note  04/01/2020 Name: Penny Kim MRN: [id number] DOB: [birth year]  Penny Kim is a 49 y.o. year old female who sees Trinna Post, [place] for primary care. The CCM team was consulted for assistance with [employer] .   Phone call to [employer]-patient's managed Medicaid plan to follow up on pre-authorization for personal care services. Form will need to be faxed again, as per representative form not received.  SDOH (Social Determinants of Health) assessments performed: No     Outpatient Encounter Medications as of 04/01/2020  Medication Sig  . amLODipine (NORVASC) 10 MG tablet Take 1 tablet (10 mg total) by mouth daily.  . Ascorbic Acid 500 MG CHEW Chew by mouth. (Patient not taking: Reported on 03/17/2020)  . busPIRone (BUSPAR) 10 MG tablet Take 2 tablets (20 mg total) by mouth 2 (two) times daily.  . clobetasol ointment (TEMOVATE) 9.41 % Apply 1 application topically 2 (two) times daily.  . clonazePAM (KLONOPIN) 0.5 MG tablet Take 1 tablet (0.5 mg total) by mouth daily as needed for anxiety. For severe anxiety attacks only  . cyclobenzaprine (FLEXERIL) 10 MG tablet Take 10 mg by mouth at bedtime.  . fluticasone (FLONASE) 50 MCG/ACT nasal spray SPRAY 2 SPRAYS INTO EACH NOSTRIL EVERY DAY  . gabapentin (NEURONTIN) 300 MG capsule TAKE 1 CAPSULE BY MOUTH FOUR TIMES A DAY (Patient not taking: Reported on 03/17/2020)  . hydrochlorothiazide (MICROZIDE) 12.5 MG capsule TAKE 1 CAPSULE BY MOUTH EVERY DAY  . meloxicam (MOBIC) 7.5 MG tablet TAKE 1 TABLET BY MOUTH EVERY DAY (Patient not taking: Reported on 03/17/2020)  . methocarbamol (ROBAXIN) 500 MG tablet TAKE 1 TABLET (500 MG TOTAL) BY MOUTH 2 (TWO) TIMES DAILY AS NEEDED FOR MUSCLE SPASMS. (Patient not taking: Reported on 03/17/2020)  . tamoxifen (NOLVADEX) 20 MG tablet Take 1 tablet (20 mg total) by mouth daily.  . traMADol (ULTRAM) 50 MG tablet Take 1 tablet (50 mg total) by mouth every 8  (eight) hours as needed. (Patient not taking: Reported on 03/17/2020)  . traZODone (DESYREL) 100 MG tablet Take 1 tablet (100 mg total) by mouth at bedtime.  . vortioxetine HBr (TRINTELLIX) 20 MG TABS tablet Take 20 mg by mouth daily.   No facility-administered encounter medications on file as of 04/01/2020.    Goals Addressed   None     Follow Up Plan: Pre-authorization form will be faxed again to [employer]-patient to be updated once pre-authorization is confirmed   Elliot Gurney, [place] Worker  [facility] [phone]

## 2020-04-02 ENCOUNTER — Other Ambulatory Visit: Payer: Self-pay

## 2020-04-02 ENCOUNTER — Ambulatory Visit (INDEPENDENT_AMBULATORY_CARE_PROVIDER_SITE_OTHER): Payer: Medicaid Other | Admitting: Licensed Clinical Social Worker

## 2020-04-02 DIAGNOSIS — F331 Major depressive disorder, recurrent, moderate: Secondary | ICD-10-CM

## 2020-04-02 DIAGNOSIS — F4010 Social phobia, unspecified: Secondary | ICD-10-CM | POA: Diagnosis not present

## 2020-04-03 ENCOUNTER — Encounter: Payer: Self-pay | Admitting: Licensed Clinical Social Worker

## 2020-04-03 NOTE — Progress Notes (Signed)
Patient Location: Home  Provider Location: Home Office   Virtual Visit via Video Note  I connected with [name] on 04/03/20 at  3:00 PM EDT by a video enabled telemedicine application and verified that I am speaking with the correct person using two identifiers.   I discussed the limitations of evaluation and management by telemedicine and the availability of in person appointments. The patient expressed understanding and agreed to proceed.  THERAPY PROGRESS NOTE  Session Time: 33 Minutes  Participation Level: Active  Behavioral Response: CasualAlertAnxious and Depressed  Type of Therapy: Individual Therapy  Treatment Goals addressed: Anxiety and Coping  Interventions: CBT  Summary: Penny Kim is a 49 y.o. female who presents with depression and anxiety sxs. Pt reported "feeling down and didn't get much rest last night after going to bed late". Pt reported she did too much yesterday cleaning the house which has worsened her chronic pain to the point that she has been laying in bed most of the day. Pt reported she did manage to keep up with a routine and engage in socializing since last session. Pt reported she is also "worried about disability hearing coming up" and doesn't want to lose her benefits. Pt identified losses she has experienced both with her career and death of loved ones that she is still grieving.    Suicidal/Homicidal: No  Therapist Response: Therapist met with patient for follow up session. Therapist and patient reviewed homework assignment. Therapist challenged negative thinking and patient's apologizing for crying. Therapist utilized reframing technique to encourage more realistic view of self and obstacles that patient has overcome. Therapist and patient discussed coping with grief/loss.  Plan: Return again in 1 week.  Diagnosis: Axis I: Major Depression, Recurrent severe and Social Anxiety    Axis II: N/A  Josephine Igo, LCSW, LCAS 04/02/20

## 2020-04-08 ENCOUNTER — Ambulatory Visit: Payer: Self-pay | Admitting: *Deleted

## 2020-04-08 ENCOUNTER — Telehealth: Payer: Self-pay | Admitting: *Deleted

## 2020-04-08 NOTE — Chronic Care Management (AMB) (Signed)
  Chronic Care Management   Social Work Note  04/08/2020 Name: Penny Kim MRN: [id number] DOB: Dec 07, 1970  Penny Kim is a 49 y.o. year old female who sees Trinna Post, [place] for primary care. The CCM team was consulted for assistance with [employer] .   Phone call to patient's managed care medicaid plan-[employer] to follow up on request form for personal care services. Personal Care Service request  completed using the in-correct form. Correct form requested and will be faxed to this social worker once request is received.  SDOH (Social Determinants of Health) assessments performed: No     Outpatient Encounter Medications as of 04/08/2020  Medication Sig  . amLODipine (NORVASC) 10 MG tablet Take 1 tablet (10 mg total) by mouth daily.  . Ascorbic Acid 500 MG CHEW Chew by mouth. (Patient not taking: Reported on 03/17/2020)  . busPIRone (BUSPAR) 10 MG tablet Take 2 tablets (20 mg total) by mouth 2 (two) times daily.  . clobetasol ointment (TEMOVATE) 6.38 % Apply 1 application topically 2 (two) times daily.  . clonazePAM (KLONOPIN) 0.5 MG tablet Take 1 tablet (0.5 mg total) by mouth daily as needed for anxiety. For severe anxiety attacks only  . cyclobenzaprine (FLEXERIL) 10 MG tablet Take 10 mg by mouth at bedtime.  . fluticasone (FLONASE) 50 MCG/ACT nasal spray SPRAY 2 SPRAYS INTO EACH NOSTRIL EVERY DAY  . gabapentin (NEURONTIN) 300 MG capsule TAKE 1 CAPSULE BY MOUTH FOUR TIMES A DAY (Patient not taking: Reported on 03/17/2020)  . hydrochlorothiazide (MICROZIDE) 12.5 MG capsule TAKE 1 CAPSULE BY MOUTH EVERY DAY  . meloxicam (MOBIC) 7.5 MG tablet TAKE 1 TABLET BY MOUTH EVERY DAY (Patient not taking: Reported on 03/17/2020)  . methocarbamol (ROBAXIN) 500 MG tablet TAKE 1 TABLET (500 MG TOTAL) BY MOUTH 2 (TWO) TIMES DAILY AS NEEDED FOR MUSCLE SPASMS. (Patient not taking: Reported on 03/17/2020)  . tamoxifen (NOLVADEX) 20 MG tablet Take 1 tablet (20 mg total) by mouth daily.  .  traMADol (ULTRAM) 50 MG tablet Take 1 tablet (50 mg total) by mouth every 8 (eight) hours as needed. (Patient not taking: Reported on 03/17/2020)  . traZODone (DESYREL) 100 MG tablet Take 1 tablet (100 mg total) by mouth at bedtime.  . vortioxetine HBr (TRINTELLIX) 20 MG TABS tablet Take 20 mg by mouth daily.   No facility-administered encounter medications on file as of 04/08/2020.    Goals Addressed   None     Follow Up Plan: This Education officer, museum will complete form for personal care services once received by [facility], [place] Worker  [facility] [phone]

## 2020-04-09 NOTE — Telephone Encounter (Signed)
I suggest the patient speak speak with her PCP.  If symptoms persist, she can be seen by any available provider as she has not yet established with a new MD since Dr. Yvone Neu left.  Nelva Bush, MD [facility]

## 2020-04-11 ENCOUNTER — Ambulatory Visit (INDEPENDENT_AMBULATORY_CARE_PROVIDER_SITE_OTHER): Payer: Medicaid Other | Admitting: Licensed Clinical Social Worker

## 2020-04-11 ENCOUNTER — Encounter: Payer: Self-pay | Admitting: Licensed Clinical Social Worker

## 2020-04-11 ENCOUNTER — Other Ambulatory Visit: Payer: Self-pay

## 2020-04-11 DIAGNOSIS — F4010 Social phobia, unspecified: Secondary | ICD-10-CM

## 2020-04-11 DIAGNOSIS — F331 Major depressive disorder, recurrent, moderate: Secondary | ICD-10-CM

## 2020-04-11 NOTE — Progress Notes (Signed)
Patient Location: Home  Provider Location: Home Office   Virtual Visit via Video Note  I connected with Penny Kim on 04/11/20 at 10:00 AM EDT by a video enabled telemedicine application and verified that I am speaking with the correct person using two identifiers.   I discussed the limitations of evaluation and management by telemedicine and the availability of in person appointments. The patient expressed understanding and agreed to proceed.  THERAPY PROGRESS NOTE  Session Time: 15 Minutes  Participation Level: Active  Behavioral Response: Casual and Well GroomedAlertEuthymic  Type of Therapy: Individual Therapy  Treatment Goals addressed: Anxiety and Coping  Interventions: CBT  Summary: Penny Kim is a 49 y.o. female who presents with minimal depression and anxiety sxs. Pt reported she is looking forward to the weekend and sensed that "something has changed" in family dynamics in which she is feeling more accepted. Pt reported even though she is experiencing some physical pain while lying in bed her plans are to take a shower, get dressed and "present myself to the community for the first time with my walker". Pt reported feeling a little self-conscious, but at the same time motivated to get out of the house. Pt reported she had a better week and has gotten more sleep. Pt reported that she was keeping an eye out for homework assignment from therapist and must have overlooked the handout containing information on grief and loss. Pt was able to locate the handout and will review and discuss with therapist next session. Pt reported no other concerns at this time.   Suicidal/Homicidal: No  Therapist Response: Therapist met with patient for follow up session. Therapist and patient discussed improvements in sxs this week and patient plans for engaging in activities socially and outside the home.   Plan: Return again in 2 weeks.  Diagnosis: Axis I: Social Anxiety and Major Depressive  Disorder, Recurrent, Moderate    Axis II: N/A  Josephine Igo, LCSW, LCAS 04/11/2020

## 2020-04-14 ENCOUNTER — Telehealth: Payer: Self-pay | Admitting: Psychiatry

## 2020-04-14 NOTE — Telephone Encounter (Signed)
Received request to complete mental impairment questionnaire for this patient from Chelsea Aus, attorney.  I do not see a release of information for [facility].  We will have Janett Billow CMA contact patient to complete a release of information and once we receive it the form can be completed.

## 2020-04-16 ENCOUNTER — Telehealth: Payer: Self-pay | Admitting: Psychiatry

## 2020-04-16 NOTE — Telephone Encounter (Signed)
Attempted to contact patient to discuss the mental  impairment questionnaire received from Ms. Linton Flemings voicemail message to call us back.

## 2020-04-18 ENCOUNTER — Telehealth: Payer: Self-pay | Admitting: *Deleted

## 2020-04-18 NOTE — Telephone Encounter (Signed)
   Care Management   Social Work Note  04/18/2020 Name: Penny Kim MRN: [id number] DOB: Jul 19, 1971  Penny Kim is a 49 y.o. year old female who sees Penny Kim, [place] for primary care. The CCM team was consulted for assistance with [employer] .   Phone call to [facility] to discuss process for requesting personal care services for patient. Application process previously used was incorrect. Phone call to [facility] -message left with Penny Kim to clarify process.  SDOH (Social Determinants of Health) assessments performed: No @SDOHINTERVENTIONS @    Outpatient Encounter Medications as of 04/18/2020  Medication Sig  . amLODipine (NORVASC) 10 MG tablet Take 1 tablet (10 mg total) by mouth daily.  . Ascorbic Acid 500 MG CHEW Chew by mouth. (Patient not taking: Reported on 03/17/2020)  . busPIRone (BUSPAR) 10 MG tablet Take 2 tablets (20 mg total) by mouth 2 (two) times daily.  . clobetasol ointment (TEMOVATE) 3.35 % Apply 1 application topically 2 (two) times daily.  . clonazePAM (KLONOPIN) 0.5 MG tablet Take 1 tablet (0.5 mg total) by mouth daily as needed for anxiety. For severe anxiety attacks only  . cyclobenzaprine (FLEXERIL) 10 MG tablet Take 10 mg by mouth at bedtime.  . fluticasone (FLONASE) 50 MCG/ACT nasal spray SPRAY 2 SPRAYS INTO EACH NOSTRIL EVERY DAY  . gabapentin (NEURONTIN) 300 MG capsule TAKE 1 CAPSULE BY MOUTH FOUR TIMES A DAY (Patient not taking: Reported on 03/17/2020)  . hydrochlorothiazide (MICROZIDE) 12.5 MG capsule TAKE 1 CAPSULE BY MOUTH EVERY DAY  . meloxicam (MOBIC) 7.5 MG tablet TAKE 1 TABLET BY MOUTH EVERY DAY (Patient not taking: Reported on 03/17/2020)  . methocarbamol (ROBAXIN) 500 MG tablet TAKE 1 TABLET (500 MG TOTAL) BY MOUTH 2 (TWO) TIMES DAILY AS NEEDED FOR MUSCLE SPASMS. (Patient not taking: Reported on 03/17/2020)  . tamoxifen (NOLVADEX) 20 MG tablet Take 1 tablet (20 mg total) by mouth daily.  . traMADol (ULTRAM) 50 MG tablet Take 1 tablet  (50 mg total) by mouth every 8 (eight) hours as needed. (Patient not taking: Reported on 03/17/2020)  . traZODone (DESYREL) 100 MG tablet Take 1 tablet (100 mg total) by mouth at bedtime.  . vortioxetine HBr (TRINTELLIX) 20 MG TABS tablet Take 20 mg by mouth daily.   No facility-administered encounter medications on file as of 04/18/2020.    Goals Addressed   None     Follow Up Plan: This Education officer, museum to await return call from [employer]-patient's managed care Medicaid plan   Elliot Gurney, [place] Worker  [facility] Care Management [phone]

## 2020-04-18 NOTE — Telephone Encounter (Signed)
    Chronic Care Management   Unsuccessful Call Note 04/18/2020 Name: Penny Kim MRN: [id number] DOB: 1970/11/04  Patient  is a 49 year old female who sees Marton Redwood, [place] for primary care. Marton Redwood, PA-C asked the CCM team to consult the patient for [employer].      This social worker was unable to reach patient via telephone today for follow up call. I have left HIPAA compliant voicemail asking patient to return my call. (unsuccessful outreach #1).   Plan: Will follow-up within 7 business days via telephone.      Elliot Gurney, [place] Worker  [facility] Care Management [phone]

## 2020-04-22 ENCOUNTER — Ambulatory Visit: Payer: Medicaid Other | Admitting: Student in an Organized Health Care Education/Training Program

## 2020-04-22 ENCOUNTER — Ambulatory Visit: Payer: Self-pay | Admitting: *Deleted

## 2020-04-22 DIAGNOSIS — M25552 Pain in left hip: Secondary | ICD-10-CM

## 2020-04-22 DIAGNOSIS — F331 Major depressive disorder, recurrent, moderate: Secondary | ICD-10-CM

## 2020-04-22 NOTE — Patient Instructions (Signed)
Thank you allowing the Chronic Care Management Team to be a part of your care! It was a pleasure speaking with you today!  1. Please call this social worker with any additional community resource needs that may arise  CCM (Chronic Care Management) Team   Neldon Labella RN, BSN Nurse Care Coordinator  [phone]  [place], LCSW Clinical Social Worker [phone]  Goals Addressed              This Visit's Progress   .  "I need help in the home" (pt-stated)        CARE PLAN ENTRY (see longitudinal plan of care for additional care plan information)  Current Barriers:  . ADL IADL limitations  Clinical Social Work Clinical Goal(s):  Marland Kitchen Over the next 90 days, patient will follow up with [facility] for personal care services* as directed by SW  Interventions: . Followed up with patient regarding need for personal care services . Patient confirmed that she now has her aunt helping her with grocery shopping and may be able to assist with some light housekeeping if asked . Patient denied need for an in home aid at this time as her family member is now available to assist in the home . Patient remains active with [facility] and reports having no additional community resource needs at this time  Patient Self Care Activities:  . Calls provider office for new concerns or questions . Unable to perform ADLs independently . Unable to perform IADLs independently  Please see past updates related to this goal by clicking on the "Past Updates" button in the selected goal      .  "I need rental assistance" completed (pt-stated)        [place] (see longitudinal plan of care for additional care plan information)  Current Barriers:  . Financial constraints related to currently being unemployed   Clinical Social Work Clinical Goal(s):  Marland Kitchen Over the next 90 days, patient will follow up with housing resources porivded* as directed by SW to  identify potential affordable housing options  Interventions: . Inter-disciplinary care team collaboration (see longitudinal plan of care) . Patient interviewed and appropriate assessments performed . Patient discussed receiving help from [place] for rental assistance-application was submitted and approved  Patient Self Care Activities:  . Patient verbalizes understanding of plan to request an in home aid . Unable to perform IADLs independently  Please see past updates related to this goal by clicking on the "Past Updates" button in the selected goal          The patient verbalized understanding of instructions provided today and declined a print copy of patient instruction materials.   No further follow up required: patient to call this social worker with any additonal community resource needs

## 2020-04-22 NOTE — Chronic Care Management (AMB) (Signed)
Care Management    Clinical Social Work Follow Up Note  04/22/2020 Name: [name] MRN: [id number] DOB: 1971-01-25  Penny Kim is a 49 y.o. year old female who is a primary care patient of Trinna Post, [place]. The CCM team was consulted for assistance with [employer] .   Review of patient status, including review of consultants reports, other relevant assessments, and collaboration with appropriate care team members and the patient's provider was performed as part of comprehensive patient evaluation and provision of chronic care management services.    SDOH (Social Determinants of Health) assessments performed: No    Outpatient Encounter Medications as of 04/22/2020  Medication Sig  . amLODipine (NORVASC) 10 MG tablet Take 1 tablet (10 mg total) by mouth daily.  . Ascorbic Acid 500 MG CHEW Chew by mouth. (Patient not taking: Reported on 03/17/2020)  . busPIRone (BUSPAR) 10 MG tablet Take 2 tablets (20 mg total) by mouth 2 (two) times daily.  . clobetasol ointment (TEMOVATE) 3.53 % Apply 1 application topically 2 (two) times daily.  . clonazePAM (KLONOPIN) 0.5 MG tablet Take 1 tablet (0.5 mg total) by mouth daily as needed for anxiety. For severe anxiety attacks only  . cyclobenzaprine (FLEXERIL) 10 MG tablet Take 10 mg by mouth at bedtime.  . fluticasone (FLONASE) 50 MCG/ACT nasal spray SPRAY 2 SPRAYS INTO EACH NOSTRIL EVERY DAY  . gabapentin (NEURONTIN) 300 MG capsule TAKE 1 CAPSULE BY MOUTH FOUR TIMES A DAY (Patient not taking: Reported on 03/17/2020)  . hydrochlorothiazide (MICROZIDE) 12.5 MG capsule TAKE 1 CAPSULE BY MOUTH EVERY DAY  . meloxicam (MOBIC) 7.5 MG tablet TAKE 1 TABLET BY MOUTH EVERY DAY (Patient not taking: Reported on 03/17/2020)  . methocarbamol (ROBAXIN) 500 MG tablet TAKE 1 TABLET (500 MG TOTAL) BY MOUTH 2 (TWO) TIMES DAILY AS NEEDED FOR MUSCLE SPASMS. (Patient not taking: Reported on 03/17/2020)  . tamoxifen (NOLVADEX) 20 MG tablet Take 1 tablet (20 mg  total) by mouth daily.  . traMADol (ULTRAM) 50 MG tablet Take 1 tablet (50 mg total) by mouth every 8 (eight) hours as needed. (Patient not taking: Reported on 03/17/2020)  . traZODone (DESYREL) 100 MG tablet Take 1 tablet (100 mg total) by mouth at bedtime.  . vortioxetine HBr (TRINTELLIX) 20 MG TABS tablet Take 20 mg by mouth daily.   No facility-administered encounter medications on file as of 04/22/2020.     Goals Addressed              This Visit's Progress   .  "I need help in the home" (pt-stated)        CARE PLAN ENTRY (see longitudinal plan of care for additional care plan information)  Current Barriers:  . ADL IADL limitations  Clinical Social Work Clinical Goal(s):  Marland Kitchen Over the next 90 days, patient will follow up with [facility] for personal care services* as directed by SW  Interventions: . Followed up with patient regarding need for personal care services . Patient confirmed that she now has her aunt helping her with grocery shopping and may be able to assist with some light housekeeping if asked . Patient denied need for an in home aid at this time as her family member is now available to assist in the home . Patient remains active with [facility] and reports having no additional community resource needs at this time  Patient Self Care Activities:  . Calls provider office for new concerns or questions .  Unable to perform ADLs independently . Unable to perform IADLs independently  Please see past updates related to this goal by clicking on the "Past Updates" button in the selected goal      .  "I need rental assistance" completed (pt-stated)        [name] (see longitudinal plan of care for additional care plan information)  Current Barriers:  . Financial constraints related to currently being unemployed   Clinical Social Work Clinical Goal(s):  Marland Kitchen Over the next 90 days, patient will follow up with housing resources  porivded* as directed by SW to identify potential affordable housing options  Interventions: . Inter-disciplinary care team collaboration (see longitudinal plan of care) . Patient interviewed and appropriate assessments performed . Patient discussed receiving help from [place] for rental assistance-application was submitted and approved  Patient Self Care Activities:  . Patient verbalizes understanding of plan to request an in home aid . Unable to perform IADLs independently  Please see past updates related to this goal by clicking on the "Past Updates" button in the selected goal          Follow Up Plan: Client will call this social worker with any additonal community resource needs that may arise in the future    Zailee Vallely, [place] Worker  [place] Care Management [phone]

## 2020-04-23 ENCOUNTER — Other Ambulatory Visit: Payer: Self-pay

## 2020-04-23 ENCOUNTER — Ambulatory Visit (INDEPENDENT_AMBULATORY_CARE_PROVIDER_SITE_OTHER): Payer: Medicaid Other | Admitting: Licensed Clinical Social Worker

## 2020-04-23 ENCOUNTER — Encounter: Payer: Self-pay | Admitting: Licensed Clinical Social Worker

## 2020-04-23 DIAGNOSIS — F331 Major depressive disorder, recurrent, moderate: Secondary | ICD-10-CM | POA: Diagnosis not present

## 2020-04-23 DIAGNOSIS — F4010 Social phobia, unspecified: Secondary | ICD-10-CM | POA: Diagnosis not present

## 2020-04-23 NOTE — Progress Notes (Signed)
Patient Location: Home  Provider Location: Home Office   Virtual Visit via Video Note  I connected with Penny Kim on 04/23/20 at  2:00 PM EDT by a video enabled telemedicine application and verified that I am speaking with the correct person using two identifiers.   I discussed the limitations of evaluation and management by telemedicine and the availability of in person appointments. The patient expressed understanding and agreed to proceed.  THERAPY PROGRESS NOTE  Session Time: 14 Minutes  Participation Level: Active  Behavioral Response: Well GroomedAlertEuthymic  Type of Therapy: Individual Therapy  Treatment Goals addressed: Coping  Interventions: CBT  Summary: Penny Kim is a 49 y.o. female who presents with depression and anxiety sxs. Pt reported she created music with her own editing software to express grief and as a way to honor mother and felt this was helpful in the healing process with the anniversary of her mother's passing coming up next week. Pt identified gained insights about her anxiety and chronic pain sxs and discussed re-engaging in various interests. Pt continues to struggle with sleep due to association with pain.  Suicidal/Homicidal: No  Therapist Response: Therapist met with patient for follow up session. Therapist and patient reviewed homework from last session. Therapist and patient discussed current sxs, patient insights into her anxiety and its connection to her chronic pain as well as improvement in her motivation to engage in prosocial activities. Therapist praised patient for her efforts to implement coping skills she is learning in therapy.  Plan: Return again in 2 weeks.  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

## 2020-04-29 ENCOUNTER — Encounter: Payer: Self-pay | Admitting: Psychiatry

## 2020-04-29 ENCOUNTER — Other Ambulatory Visit: Payer: Self-pay

## 2020-04-29 ENCOUNTER — Ambulatory Visit (INDEPENDENT_AMBULATORY_CARE_PROVIDER_SITE_OTHER): Payer: Medicaid Other | Admitting: Psychiatry

## 2020-04-29 DIAGNOSIS — F429 Obsessive-compulsive disorder, unspecified: Secondary | ICD-10-CM | POA: Diagnosis not present

## 2020-04-29 DIAGNOSIS — G4701 Insomnia due to medical condition: Secondary | ICD-10-CM | POA: Diagnosis not present

## 2020-04-29 DIAGNOSIS — F3341 Major depressive disorder, recurrent, in partial remission: Secondary | ICD-10-CM

## 2020-04-29 DIAGNOSIS — F4010 Social phobia, unspecified: Secondary | ICD-10-CM | POA: Diagnosis not present

## 2020-04-29 DIAGNOSIS — F1320 Sedative, hypnotic or anxiolytic dependence, uncomplicated: Secondary | ICD-10-CM

## 2020-04-29 NOTE — Progress Notes (Signed)
Provider Location : [facility] Patient Location : Home  Participants: Patient , Provider   Virtual Visit via Video Note  I connected with Penny Kim on 04/29/20 at 10:40 AM EDT by a video enabled telemedicine application and verified that I am speaking with the correct person using two identifiers.   I discussed the limitations of evaluation and management by telemedicine and the availability of in person appointments. The patient expressed understanding and agreed to proceed.     I discussed the assessment and treatment plan with the patient. The patient was provided an opportunity to ask questions and all were answered. The patient agreed with the plan and demonstrated an understanding of the instructions.   The patient was advised to call back or seek an in-person evaluation if the symptoms worsen or if the condition fails to improve as anticipated.   [place] MD OP Progress Note  04/29/2020 12:46 PM Penny Kim  MRN:  [id number]  Chief Complaint:  Chief Complaint    Follow-up     HPI: Penny Kim is a 49 year old African-American female, single, lives in [place], has a history of anxiety, MDD, OCD, insomnia, benzodiazepine dependence, history of PTSD, avascular necrosis of bone, history of breast cancer currently on tamoxifen, history of bilateral mastectomy, left hip pain, allergic rhinitis was evaluated by telemedicine today.  Patient today reports she is currently making progress with regards to her anxiety and depression.  She reports she has definitely noticed an improvement on the BuSpar.  Patient however became tearful during the session when we discussed her disability application.  Discussed with patient that writer had received a form to complete regarding behavioral health questionnaire.  Patient became very tearful about this. Patient appeared to be restless during the session and had to lay down on her bed to complete evaluation.  Patient reports she is always in pain  and is unable to sit or stand for too long because of the pain.  This does make her anxiety worse often.  She reports sleep is improved since her last visit with Probation officer.  She is compliant on medications as prescribed.  She reports that restarting psychotherapy sessions has definitely helped her a lot.  She is completely off of the Klonopin and reports she takes propranolol as needed which does help with anxiety attacks.  She however reports she continues to struggle with inability to deal with stress, inability to maintain interpersonal relationships, problems with persistent concentration for long periods of time and so on.  She uses a walker to walk because of her pain and inability to stand for too long.  She reports she has to take breaks several times to get things done.  Patient denies any other concerns today.    Visit Diagnosis:    ICD-10-CM   1. MDD (major depressive disorder), recurrent, in partial remission ([place])  F33.41   2. Obsessive-compulsive disorder, unspecified type  F42.9   3. Social anxiety disorder  F40.10   4. Insomnia due to medical condition  G47.01   5. Benzodiazepine dependence ([place])  F13.20     Past Psychiatric History: I have reviewed past psychiatric history from my progress note on 12/10/2019.  Past trials of Prozac, Paxil, Zoloft, Xanax, Ambien, melatonin  Past Medical History:  Past Medical History:  Diagnosis Date  . Allergy   . Anxiety   . Breast cancer, right breast ([place]) 02/2010   s/p chemo (completed 09/2010) and right mastectomy w/reconstruction  . Depression   . Family history  of breast cancer   . Family history of lung cancer   . Family history of ovarian cancer   . History of stress test    a. treadmill Myoview 11/2016: small defect of mild severity present in the apex location felt to be secondary to breast attenuation. EF 55-65%. Normal study  . HTN (hypertension)   . Orthostatic hypotension   . Psoriasis   . Systolic dysfunction    a.  TTE 10/2016: EF 50-55%, no RWMA, normal LV diastolic function, left atrium normal, RV systolic function normal, PASP normal    Past Surgical History:  Procedure Laterality Date  . BREAST RECONSTRUCTION  01/06/2012   Procedure: BREAST RECONSTRUCTION;  Surgeon: Crissie Reese, MD;  Location: [facility];  Service: Plastics;  Laterality: Bilateral;  bilateral removal of tissue expanders, bilateral placement of implants--Breast  . BREAST SURGERY  06/19/2010   exploration of right mastectomy site due to post-op bleeding  . INSERTION OF TISSUE EXPANDER AFTER MASTECTOMY  06/19/2010   bilat. mastectomies with bilat. tissue expanders  . PORT-A-CATH REMOVAL  11/26/2010  . PORTACATH PLACEMENT  07/23/2010  . TISSUE EXPANDER REMOVAL  10/20/2010   left    Family Psychiatric History: I have reviewed family psychiatric history from my progress note on 12/10/2019  Family History:  Family History  Problem Relation Age of Onset  . Hypertension Mother   . Depression Mother   . Heart attack Mother   . Hyperlipidemia Mother   . Cancer Sister 30       Ovarian cancer  . Hypertension Maternal Grandmother   . Diabetes Maternal Grandmother   . 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 Hypertension Maternal Grandfather   . Diabetes Paternal Grandmother   . Hypertension Paternal Grandmother   . Kidney disease Paternal Grandmother        End stage renal dz - dialysis  . Hyperlipidemia Paternal Grandmother   . Breast cancer Paternal Grandmother   . Alcohol abuse Father   . Depression Father   . Drug abuse Father 66       Died of drug overdose    Social History: I have reviewed social history from my progress note on 12/10/2019 Social History   Socioeconomic History  . Marital status: Single    Spouse name: Not on file  . Number of children: Not on file  . Years of education: 73  . Highest education level: Not on file  Occupational History  . Occupation:  Land    Comment: [employer]  Tobacco Use  . Smoking status: Never Smoker  . Smokeless tobacco: Never Used  Vaping Use  . Vaping Use: Never used  Substance and Sexual Activity  . Alcohol use: No    Comment: occasionally  . Drug use: No  . Sexual activity: Not Currently    Birth control/protection: None  Other Topics Concern  . Not on file  Social History Narrative   Clotile [place], [place]. She attended [employer] where she obtained her Bachelors of Science degree in Psychology. 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 She currently lives in [place]. She lives alone. Darielle enjoys attending live musical performances. 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    Social Determinants of Health   Financial Resource Strain:   . Difficulty of Paying  Living Expenses:   Food Insecurity:   . Worried About Charity fundraiser in the Last Year:   . Arboriculturist in the Last Year:   Transportation Needs:   . Film/video editor (Medical):   Marland Kitchen Lack of Transportation (Non-Medical):   Physical Activity:   . Days of Exercise per Week:   . Minutes of Exercise per Session:   Stress:   . Feeling of Stress :   Social Connections:   . Frequency of Communication with Friends and Family:   . Frequency of Social Gatherings with Friends and Family:   . Attends Religious Services:   . Active Member of Clubs or Organizations:   . Attends Archivist Meetings:   Marland Kitchen Marital Status:     Allergies:  Allergies  Allergen Reactions  . Prochlorperazine Other (See Comments)    SYNCOPE SYNCOPE  . Methadone Hcl Itching  . Other   . Propranolol     rash  . Tape Rash    Metabolic Disorder Labs: Lab Results  Component Value Date   HGBA1C 5.9 (H) 01/23/2020   No results found for: PROLACTIN Lab Results  Component Value Date    CHOL 203 (H) 09/01/2017   TRIG 90 09/01/2017   HDL 73 09/01/2017   CHOLHDL 4 08/06/2016   VLDL 34.4 08/06/2016   LDLCALC 112 (H) 09/01/2017   LDLCALC 154 (H) 08/06/2016   Lab Results  Component Value Date   TSH 4.320 01/23/2020   TSH 2.780 09/01/2017    Therapeutic Level Labs: No results found for: LITHIUM No results found for: VALPROATE No components found for:  CBMZ  Current Medications: Current Outpatient Medications  Medication Sig Dispense Refill  . amLODipine (NORVASC) 10 MG tablet Take 1 tablet (10 mg total) by mouth daily. 90 tablet 3  . busPIRone (BUSPAR) 10 MG tablet Take 2 tablets (20 mg total) by mouth 2 (two) times daily. 360 tablet 0  . clobetasol ointment (TEMOVATE) 4.69 % Apply 1 application topically 2 (two) times daily. 60 g 3  . cyclobenzaprine (FLEXERIL) 10 MG tablet Take 10 mg by mouth at bedtime.    . fluticasone (FLONASE) 50 MCG/ACT nasal spray SPRAY 2 SPRAYS INTO EACH NOSTRIL EVERY DAY 48 mL 0  . hydrochlorothiazide (MICROZIDE) 12.5 MG capsule TAKE 1 CAPSULE BY MOUTH EVERY DAY 90 capsule 3  . tamoxifen (NOLVADEX) 20 MG tablet Take 1 tablet (20 mg total) by mouth daily. 90 tablet 4  . traMADol (ULTRAM) 50 MG tablet Take 1 tablet (50 mg total) by mouth every 8 (eight) hours as needed. 6 tablet 0  . traZODone (DESYREL) 100 MG tablet Take 1 tablet (100 mg total) by mouth at bedtime. 90 tablet 0  . vortioxetine HBr (TRINTELLIX) 20 MG TABS tablet Take 20 mg by mouth daily.     No current facility-administered medications for this visit.     Musculoskeletal: Strength & Muscle Tone: UTA Gait & Station: Observed as laying on bed Patient leans: N/A  Psychiatric Specialty Exam: Review of Systems  Musculoskeletal: Positive for back pain and myalgias.  Psychiatric/Behavioral: Positive for dysphoric mood. The patient is nervous/anxious.   All other systems reviewed and are negative.   There were no vitals taken for this visit.There is no height or weight on  file to calculate BMI.  General Appearance: Casual  Eye Contact:  Fair  Speech:  Clear and Coherent  Volume:  Normal  Mood:  Anxious and Dysphoric Improving  Affect:  Congruent  Thought Process:  Goal Directed and Descriptions of Associations: Intact  Orientation:  Full (Time, Place, and Person)  Thought Content: Logical   Suicidal Thoughts:  No  Homicidal Thoughts:  No  Memory:  Immediate;   Fair Recent;   Fair Remote;   Fair  Judgement:  Fair  Insight:  Fair  Psychomotor Activity:  Normal  Concentration:  Concentration: Fair and Attention Span: Fair  Recall:  [employer] of Knowledge: Fair  Language: Fair  Akathisia:  No  Handed:  Right  AIMS (if indicated): UTA  Assets:  Communication Skills Desire for Improvement Social Support  ADL's:  Intact  Cognition: WNL  Sleep:  Fair   Screenings: PHQ2-9     Office Visit from 12/30/2017 in [facility] Visit from 09/22/2017 in [facility] Office Visit from 10/19/2016 in [facility] Office Visit from 08/27/2015 in [facility]  PHQ-2 Total Score 3 6 0 6  PHQ-9 Total Score 19 23 -- 21       Assessment and Plan: Penny Kim is a 49 year old African-American female, never been married, has a history of MDD, OCD, history of PTSD, social anxiety disorder, multiple medical problems including avascular necrosis, left hip pain, history of breast cancer status post bilateral mastectomy currently on tamoxifen was evaluated by telemedicine today.  Patient is currently  Plan MDD-improving Trintellix 20 mg p.o. daily BuSpar 20 mg p.o. twice daily  OCD-unspecified-improving Referred for CBT She is also on medications like BuSpar and Trintellix.  Social anxiety disorder-improving Propranolol 10 mg p.o. 3 times daily as needed for anxiety attacks Discontinue Klonopin. Continue psychotherapy sessions with Ms. Zadie Rhine. I have discussed IOP referral in the  past  Insomnia--multifactorial including pain-stable Trazodone 100 mg p.o. nightly  Benzodiazepine dependence-improving She is completely off of the Klonopin now. Reviewed [place] controlled substance database  Pending records from Dr. Toy Care, will request it again.  Some time was spent completing mental health questionnaire for her disability appeal.  Discussed with patient to sign a release so we can send it/fax it.  She will give [place] a call regarding that.  I have spent atleast 30 minutes face to face by video with patient today. More than 50 % of the time was spent for preparing to see the patient ( e.g., review of test, records ), obtaining and to review and separately obtained history , ordering medications and test ,psychoeducation and supportive psychotherapy and care coordination,as well as documenting clinical information in electronic health record,interpreting results of test and communication of results This note was generated in part or whole with voice recognition software. Voice recognition is usually quite accurate but there are transcription errors that can and very often do occur. I apologize for any typographical errors that were not detected and corrected.      Ursula Alert, MD 04/30/2020, 8:08 AM

## 2020-05-06 ENCOUNTER — Ambulatory Visit: Payer: Medicaid Other | Admitting: Student in an Organized Health Care Education/Training Program

## 2020-05-07 ENCOUNTER — Other Ambulatory Visit: Payer: Self-pay

## 2020-05-07 ENCOUNTER — Ambulatory Visit: Payer: Medicaid Other | Admitting: Licensed Clinical Social Worker

## 2020-05-20 ENCOUNTER — Ambulatory Visit: Payer: Medicaid Other | Admitting: Student in an Organized Health Care Education/Training Program

## 2020-05-29 ENCOUNTER — Ambulatory Visit (INDEPENDENT_AMBULATORY_CARE_PROVIDER_SITE_OTHER): Payer: Medicaid Other | Admitting: Licensed Clinical Social Worker

## 2020-05-29 ENCOUNTER — Encounter: Payer: Self-pay | Admitting: Licensed Clinical Social Worker

## 2020-05-29 ENCOUNTER — Other Ambulatory Visit: Payer: Self-pay

## 2020-05-29 DIAGNOSIS — M5416 Radiculopathy, lumbar region: Secondary | ICD-10-CM | POA: Diagnosis not present

## 2020-05-29 DIAGNOSIS — F4010 Social phobia, unspecified: Secondary | ICD-10-CM | POA: Diagnosis not present

## 2020-05-29 DIAGNOSIS — F331 Major depressive disorder, recurrent, moderate: Secondary | ICD-10-CM

## 2020-05-29 NOTE — Progress Notes (Signed)
Patient Location: Home  Provider Location: Home Office   Virtual Visit via Video Note  I connected with Penny Kim on 05/29/20 at 10:00 AM EDT by a video enabled telemedicine application and verified that I am speaking with the correct person using two identifiers.   I discussed the limitations of evaluation and management by telemedicine and the availability of in person appointments. The patient expressed understanding and agreed to proceed.  THERAPY PROGRESS NOTE  Session Time: 55 Minutes  Participation Level: Active  Behavioral Response: Well GroomedAlertAnxious, Depressed and Tearful  Type of Therapy: Individual Therapy  Treatment Goals addressed: Anxiety and Coping  Interventions: CBT  Summary: Penny Kim is a 49 y.o. female who presents with depression and anxiety sxs. Pt reported anxiety and chronic pain continue to present as barriers and is experiencing "loneliness". However, patient reported she is trying to engage more in prosocial activities and that Sunday dinners with family are beneficial. Pt reported that for the last month since last session she was "balled up on the couch watching TV" and deleted all of her notifications off of her phone because they were becoming annoyances. Pt reported she is ready to get back on track and identified things she could do to improve mood and resolve feelings of loneliness.  Suicidal/Homicidal: No  Therapist Response: Therapist met with patient for follow up session. Therapist and patient discussed current sxs, use of coping skills and explored how to get back into routine. Pt was receptive.  Plan: Return again in 2 weeks.  Diagnosis: Axis I: Social Anxiety and Moderate Episode of Recurrent MDD    Axis II: N/A  Josephine Igo, LCSW, LCAS 05/29/2020

## 2020-06-06 ENCOUNTER — Encounter: Payer: Self-pay | Admitting: Physician Assistant

## 2020-06-08 ENCOUNTER — Other Ambulatory Visit: Payer: Self-pay | Admitting: Psychiatry

## 2020-06-08 DIAGNOSIS — F4010 Social phobia, unspecified: Secondary | ICD-10-CM

## 2020-06-08 DIAGNOSIS — F429 Obsessive-compulsive disorder, unspecified: Secondary | ICD-10-CM

## 2020-06-08 DIAGNOSIS — G4701 Insomnia due to medical condition: Secondary | ICD-10-CM

## 2020-06-12 ENCOUNTER — Encounter: Payer: Self-pay | Admitting: Licensed Clinical Social Worker

## 2020-06-12 ENCOUNTER — Other Ambulatory Visit: Payer: Self-pay

## 2020-06-12 ENCOUNTER — Ambulatory Visit (INDEPENDENT_AMBULATORY_CARE_PROVIDER_SITE_OTHER): Payer: Medicaid Other | Admitting: Licensed Clinical Social Worker

## 2020-06-12 DIAGNOSIS — F331 Major depressive disorder, recurrent, moderate: Secondary | ICD-10-CM

## 2020-06-12 DIAGNOSIS — G4701 Insomnia due to medical condition: Secondary | ICD-10-CM | POA: Diagnosis not present

## 2020-06-12 NOTE — Progress Notes (Signed)
Patient Location: Home  Provider Location: Home Office   Virtual Visit via Telephone Note  I connected with Penny Kim on 06/12/20 at 11:00 AM EDT by telephone and verified that I am speaking with the correct person using two identifiers.   I discussed the limitations, risks, security and privacy concerns of performing an evaluation and management service by telephone and the availability of in person appointments. I also discussed with the patient that there may be a patient responsible charge related to this service. The patient expressed understanding and agreed to proceed.  THERAPY PROGRESS NOTE  Session Time: 10 Minutes  Participation Level: Active  Behavioral Response: LethargicAnxious and Depressed  Type of Therapy: Individual Therapy  Treatment Goals addressed: Anxiety and Coping  Interventions: CBT  Summary: Penny Kim is a 49 y.o. female who presents with depression and anxiety sxs. Pt was late to appointment from oversleeping and called back therapist for short check-in. Pt reported that she has been having nightmares and visions of her mother in the bathroom where she died. Pt reported what may have triggered this was her mother's birthday on Tuesday. Pt reported no changes in medications and continues to be compliant. Pt reported excessive dry mouth as main side effect. Pt reported she continues to meet with family for Sunday dinners, but has not quite been back on a routine since last session. Pt reported she tries to pace her activities and spreads them out to 1-2 tasks/appointments daily.    Suicidal/Homicidal: No  Therapist Response: Therapist met with patient for follow up session. Therapist and patient discussed current sxs and grief and loss issues. Therapist recommended patient go back to weekly sessions and scheduled for next week. Pt was receptive.  Plan: Return again in 1 week.  Diagnosis: Axis I: Social Anxiety and MDD, Recurrent, Moderate    Axis II:  N/A   Josephine Igo, LCSW, LCAS 06/12/2020

## 2020-06-18 ENCOUNTER — Telehealth: Payer: Self-pay

## 2020-06-18 ENCOUNTER — Other Ambulatory Visit: Payer: Self-pay

## 2020-06-18 ENCOUNTER — Ambulatory Visit (INDEPENDENT_AMBULATORY_CARE_PROVIDER_SITE_OTHER): Payer: Medicaid Other | Admitting: Licensed Clinical Social Worker

## 2020-06-18 ENCOUNTER — Telehealth (INDEPENDENT_AMBULATORY_CARE_PROVIDER_SITE_OTHER): Payer: Medicaid Other | Admitting: Psychiatry

## 2020-06-18 ENCOUNTER — Encounter: Payer: Self-pay | Admitting: Psychiatry

## 2020-06-18 ENCOUNTER — Encounter: Payer: Self-pay | Admitting: Licensed Clinical Social Worker

## 2020-06-18 DIAGNOSIS — G4701 Insomnia due to medical condition: Secondary | ICD-10-CM

## 2020-06-18 DIAGNOSIS — F411 Generalized anxiety disorder: Secondary | ICD-10-CM | POA: Diagnosis not present

## 2020-06-18 DIAGNOSIS — F4010 Social phobia, unspecified: Secondary | ICD-10-CM

## 2020-06-18 DIAGNOSIS — F331 Major depressive disorder, recurrent, moderate: Secondary | ICD-10-CM

## 2020-06-18 DIAGNOSIS — F429 Obsessive-compulsive disorder, unspecified: Secondary | ICD-10-CM | POA: Diagnosis not present

## 2020-06-18 DIAGNOSIS — Z8659 Personal history of other mental and behavioral disorders: Secondary | ICD-10-CM

## 2020-06-18 MED ORDER — BUSPIRONE HCL 10 MG PO TABS: 20.0000 mg | ORAL_TABLET | Freq: Three times a day (TID) | ORAL | 0 refills | Status: DC

## 2020-06-18 MED ORDER — VORTIOXETINE HBR 20 MG PO TABS: 20.0000 mg | ORAL_TABLET | Freq: Every day | ORAL | 1 refills | Status: DC

## 2020-06-18 NOTE — Telephone Encounter (Signed)
received fax requesting a prior auth to be completed for trintellix

## 2020-06-18 NOTE — Progress Notes (Signed)
Provider Location : [facility] Patient Location : Home  Participants: Patient , Provider  Virtual Visit via Video Note  I connected with Penny Kim on 06/18/20 at 10:30 AM EDT by a video enabled telemedicine application and verified that I am speaking with the correct person using two identifiers.   I discussed the limitations of evaluation and management by telemedicine and the availability of in person appointments. The patient expressed understanding and agreed to proceed.     I discussed the assessment and treatment plan with the patient. The patient was provided an opportunity to ask questions and all were answered. The patient agreed with the plan and demonstrated an understanding of the instructions.   The patient was advised to call back or seek an in-person evaluation if the symptoms worsen or if the condition fails to improve as anticipated.   [facility] MD OP Progress Note  06/18/2020 6:13 PM Penny Kim  MRN:  [id number]  Chief Complaint:  Chief Complaint    Follow-up     HPI: Penny Kim is a 49 year old African-American female, single, lives in [place], has a history of anxiety, MDD, OCD, insomnia, benzodiazepine dependence, history of PTSD, avascular necrosis of bone, history of breast cancer currently on tamoxifen, history of bilateral mastectomy, left hip pain, allergic rhinitis was evaluated by telemedicine today.  Patient today reports she continues to struggle with a lot of anxiety.  She reports she is often on edge and restless and feels overwhelmed.  She stopped taking the propranolol since she had side effects of rash.  She is not interested in going back on Klonopin because of the long-term risk factors and memory changes.  She reports she is agreeable to increasing the BuSpar.  She is happy with her Trintellix and wants to stay on it.  Patient reports sleep is good at night.  She however reports she copes when she has a lot of anxiety by sleeping during the  day as well.  She is currently working with her therapist and allows her therapy sessions.  She denies any suicidality, homicidality or perceptual disturbances.  She reports she continues to have some obsessions of handwashing however that is more under control now.  Patient denies any other concerns today.  Visit Diagnosis:    ICD-10-CM   1. Moderate episode of recurrent major depressive disorder (HCC)  F33.1 vortioxetine HBr (TRINTELLIX) 20 MG TABS tablet  2. GAD (generalized anxiety disorder)  F41.1   3. Social anxiety disorder  F40.10 vortioxetine HBr (TRINTELLIX) 20 MG TABS tablet    busPIRone (BUSPAR) 10 MG tablet  4. Insomnia due to medical condition  G47.01   5. History of OCD (obsessive compulsive disorder)  Z86.59 busPIRone (BUSPAR) 10 MG tablet    Past Psychiatric History: I have reviewed past psychiatric history from my progress note on 12/10/2019.  Past trials of Prozac, Paxil, Zoloft, Xanax, Ambien, melatonin  Past Medical History:  Past Medical History:  Diagnosis Date  . Allergy   . Anxiety   . Breast cancer, right breast ([place]) 02/2010   s/p chemo (completed 09/2010) and right mastectomy w/reconstruction  . Depression   . Family history of breast cancer   . Family history of lung cancer   . Family history of ovarian cancer   . History of stress test    a. treadmill Myoview 11/2016: small defect of mild severity present in the apex location felt to be secondary to breast attenuation. EF 55-65%. Normal study  . HTN (hypertension)   .  Orthostatic hypotension   . Psoriasis   . Systolic dysfunction    a. TTE 10/2016: EF 50-55%, no RWMA, normal LV diastolic function, left atrium normal, RV systolic function normal, PASP normal    Past Surgical History:  Procedure Laterality Date  . BREAST RECONSTRUCTION  01/06/2012   Procedure: BREAST RECONSTRUCTION;  Surgeon: Crissie Reese, MD;  Location: [facility];  Service: Plastics;  Laterality: Bilateral;  bilateral  removal of tissue expanders, bilateral placement of implants--Breast  . BREAST SURGERY  06/19/2010   exploration of right mastectomy site due to post-op bleeding  . INSERTION OF TISSUE EXPANDER AFTER MASTECTOMY  06/19/2010   bilat. mastectomies with bilat. tissue expanders  . PORT-A-CATH REMOVAL  11/26/2010  . PORTACATH PLACEMENT  07/23/2010  . TISSUE EXPANDER REMOVAL  10/20/2010   left    Family Psychiatric History: I have reviewed family psychiatric history from my progress note on 12/10/2019  Family History:  Family History  Problem Relation Age of Onset  . Hypertension Mother   . Depression Mother   . Heart attack Mother   . Hyperlipidemia Mother   . Cancer Sister 30       Ovarian cancer  . Hypertension Maternal Grandmother   . Diabetes Maternal Grandmother   . 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 Hypertension Maternal Grandfather   . Diabetes Paternal Grandmother   . Hypertension Paternal Grandmother   . Kidney disease Paternal Grandmother        End stage renal dz - dialysis  . Hyperlipidemia Paternal Grandmother   . Breast cancer Paternal Grandmother   . Alcohol abuse Father   . Depression Father   . Drug abuse Father 19       Died of drug overdose    Social History: I have reviewed social history from my progress note on 12/10/2019 Social History   Socioeconomic History  . Marital status: Single    Spouse name: Not on file  . Number of children: Not on file  . Years of education: 10  . Highest education level: Not on file  Occupational History  . Occupation: Land    Comment: [employer]  Tobacco Use  . Smoking status: Never Smoker  . Smokeless tobacco: Never Used  Vaping Use  . Vaping Use: Never used  Substance and Sexual Activity  . Alcohol use: No    Comment: occasionally  . Drug use: No  . Sexual activity: Not Currently    Birth control/protection: None  Other  Topics Concern  . Not on file  Social History Narrative   Penny Kim [place], [place]. She attended [employer] where she obtained her Bachelors of Science degree in Psychology. 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 She currently lives in [place]. She lives alone. Penny Kim enjoys attending live musical performances. 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    Social Determinants of Health   Financial Resource Strain:   . Difficulty of Paying Living Expenses: Not on file  Food Insecurity:   . Worried About Charity fundraiser in the Last Year: Not on file  . Ran Out of Food in the Last Year: Not on file  Transportation Needs:   . Lack of Transportation (Medical): Not on file  . Lack of Transportation (Non-Medical): Not on file  Physical  Activity:   . Days of Exercise per Week: Not on file  . Minutes of Exercise per Session: Not on file  Stress:   . Feeling of Stress : Not on file  Social Connections:   . Frequency of Communication with Friends and Family: Not on file  . Frequency of Social Gatherings with Friends and Family: Not on file  . Attends Religious Services: Not on file  . Active Member of Clubs or Organizations: Not on file  . Attends Archivist Meetings: Not on file  . Marital Status: Not on file    Allergies:  Allergies  Allergen Reactions  . Prochlorperazine Other (See Comments)    SYNCOPE SYNCOPE  . Methadone Hcl Itching  . Other   . Propranolol     rash  . Tape Rash    Metabolic Disorder Labs: Lab Results  Component Value Date   HGBA1C 5.9 (H) 01/23/2020   No results found for: PROLACTIN Lab Results  Component Value Date   CHOL 203 (H) 09/01/2017   TRIG 90 09/01/2017   HDL 73 09/01/2017   CHOLHDL 4 08/06/2016   VLDL 34.4 08/06/2016   LDLCALC 112 (H) 09/01/2017   LDLCALC 154 (H) 08/06/2016   Lab Results  Component Value Date   TSH  4.320 01/23/2020   TSH 2.780 09/01/2017    Therapeutic Level Labs: No results found for: LITHIUM No results found for: VALPROATE No components found for:  CBMZ  Current Medications: Current Outpatient Medications  Medication Sig Dispense Refill  . amLODipine (NORVASC) 10 MG tablet Take 1 tablet (10 mg total) by mouth daily. 90 tablet 3  . busPIRone (BUSPAR) 10 MG tablet Take 2 tablets (20 mg total) by mouth 3 (three) times daily. 540 tablet 0  . clobetasol ointment (TEMOVATE) 5.62 % Apply 1 application topically 2 (two) times daily. 60 g 3  . cyclobenzaprine (FLEXERIL) 10 MG tablet Take 10 mg by mouth at bedtime.    . fluticasone (FLONASE) 50 MCG/ACT nasal spray SPRAY 2 SPRAYS INTO EACH NOSTRIL EVERY DAY 48 mL 0  . hydrochlorothiazide (MICROZIDE) 12.5 MG capsule TAKE 1 CAPSULE BY MOUTH EVERY DAY 90 capsule 3  . tamoxifen (NOLVADEX) 20 MG tablet Take 1 tablet (20 mg total) by mouth daily. 90 tablet 4  . traMADol (ULTRAM) 50 MG tablet Take 1 tablet (50 mg total) by mouth every 8 (eight) hours as needed. 6 tablet 0  . traZODone (DESYREL) 100 MG tablet TAKE 1 TABLET BY MOUTH EVERYDAY AT BEDTIME 90 tablet 0  . vortioxetine HBr (TRINTELLIX) 20 MG TABS tablet Take 1 tablet (20 mg total) by mouth daily. 90 tablet 1   No current facility-administered medications for this visit.     Musculoskeletal: Strength & Muscle Tone: UTA Gait & Station: Walks with cane Patient leans: N/A  Psychiatric Specialty Exam: Review of Systems  Psychiatric/Behavioral: Positive for dysphoric mood. The patient is nervous/anxious.   All other systems reviewed and are negative.   There were no vitals taken for this visit.There is no height or weight on file to calculate BMI.  General Appearance: Casual  Eye Contact:  Fair  Speech:  Clear and Coherent  Volume:  Normal  Mood:  Anxious and Depressed  Affect:  Congruent  Thought Process:  Goal Directed and Descriptions of Associations: Intact  Orientation:   Full (Time, Place, and Person)  Thought Content: Logical   Suicidal Thoughts:  No  Homicidal Thoughts:  No  Memory:  Immediate;   Fair Recent;  Fair Remote;   Fair  Judgement:  Fair  Insight:  Fair  Psychomotor Activity:  Normal  Concentration:  Concentration: Fair and Attention Span: Fair  Recall:  [employer] of Knowledge: Good  Language: Fair  Akathisia:  No  Handed:  Right  AIMS (if indicated): uta  Assets:  Communication Skills Desire for Improvement Housing Social Support  ADL's:  Intact  Cognition: WNL  Sleep:  Fair   Screenings: PHQ2-9     Office Visit from 12/30/2017 in [facility] Visit from 09/22/2017 in [facility] Visit from 10/19/2016 in [facility] Office Visit from 08/27/2015 in [facility]  PHQ-2 Total Score 3 6 0 6  PHQ-9 Total Score 19 23 -- 21       Assessment and Plan: Penny Kim is a 49 year old African-American female, never been married, has a history of OCD, MDD, history of PTSD, social anxiety disorder, multiple medical problems including avascular necrosis of left hip, rest cancer status post bilateral mastectomy on tamoxifen was evaluated by telemedicine today.  Patient is currently struggling with anxiety symptoms.  Plan MDD-some progress Trintellix 20 mg p.o. daily BuSpar as prescribed  GAD-unstable Increase BuSpar to 20 mg p.o. 3 times daily Continue CBT ,discussed referral for IOP.  She will have a discussion with her therapist.  Social anxiety disorder-unstable Increase BuSpar to 20 mg 3 times a day Discontinue propranolol for side effects Continue CBT with Ms. Zadie Rhine  Insomnia-multifactorial including pain-hot flashes from tamoxifen-stable Trazodone 100 mg p.o. nightly  I have reviewed medical records from Dr. Toy Care - dated -510 08/01/2019-patient with history of MDD, GAD, panic disorder-pain makes antidepressants less effective-currently on  Trintellix which does help but continues to have lots of anxiety.'   Follow-up in clinic in 4 weeks or sooner if needed.  I have spent atleast 30 minutes face to face with patient today. More than 50 % of the time was spent for preparing to see the patient ( e.g., review of test, records ), obtaining and to review and separately obtained history , ordering medications and test ,psychoeducation and supportive psychotherapy and care coordination,as well as documenting clinical information in electronic health record. This note was generated in part or whole with voice recognition software. Voice recognition is usually quite accurate but there are transcription errors that can and very often do occur. I apologize for any typographical errors that were not detected and corrected.       Ursula Alert, MD 06/18/2020, 6:13 PM

## 2020-06-18 NOTE — Telephone Encounter (Signed)
went online to [email or web address] and submitted prior auth. pa was approved case #[id number] good from 06-18-20 to 06-18-21

## 2020-06-18 NOTE — Progress Notes (Signed)
Patient Location: Home  Provider Location: Home Office   Virtual Visit via Video Note  I connected with Penny Kim on 06/18/20 at 2:35 PM EST by a video enabled telemedicine application and verified that I am speaking with the correct person using two identifiers.   I discussed the limitations of evaluation and management by telemedicine and the availability of in person appointments. The patient expressed understanding and agreed to proceed.  THERAPY PROGRESS NOTE  Session Time: 25 Minutes  Participation Level: Active  Behavioral Response: CasualAlertAnxious and Depressed  Type of Therapy: Individual Therapy  Treatment Goals addressed: Anxiety and Coping  Interventions: CBT  Summary: Penny Kim is a 49 y.o. female who presents with depression and anxiety sxs. depression and anxiety sxs. Pt was again late to appointment from oversleeping and answered therapist second contact attempt. Pt reported she recently met w/ Dr. Shea Evans and was able to up her dosage of anti-anxiety medications which "wiped me out". Pt apologized for losing track of time and oversleeping. 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 Pt reported feeling like she didn't really get to have a childhood, has felt this pressure to do really well, and feels like she is falling short of her own expectations. Pt reported plans to use her calendar to keep track of appointments.  Suicidal/Homicidal: No  Therapist Response: Therapist met with patient for follow up session. Therapist and patient explored self-defeating thoughts and messages stemming from childhood. Therapist supported patient's engagement in creative outlets.  Plan: Return again in 1 week.  Diagnosis: Axis I: Social Anxiety and MDD, Recurrent, Moderate    Axis II: N/A  Penny Igo, LCSW, LCAS 06/18/2020

## 2020-06-25 ENCOUNTER — Encounter: Payer: Self-pay | Admitting: Licensed Clinical Social Worker

## 2020-06-25 ENCOUNTER — Ambulatory Visit (INDEPENDENT_AMBULATORY_CARE_PROVIDER_SITE_OTHER): Payer: Medicaid Other | Admitting: Licensed Clinical Social Worker

## 2020-06-25 ENCOUNTER — Other Ambulatory Visit: Payer: Self-pay

## 2020-06-25 DIAGNOSIS — F331 Major depressive disorder, recurrent, moderate: Secondary | ICD-10-CM

## 2020-06-25 DIAGNOSIS — F4010 Social phobia, unspecified: Secondary | ICD-10-CM | POA: Diagnosis not present

## 2020-06-25 NOTE — Progress Notes (Signed)
Patient Location: Home  °Provider Location: Home Office  ° °Virtual Visit via Video Note ° °I connected with Penny Kim on 06/25/20 at  2:00 PM EDT by a video enabled telemedicine application and verified that I am speaking with the correct person using two identifiers. °  °I discussed the limitations of evaluation and management by telemedicine and the availability of in person appointments. The patient expressed understanding and agreed to proceed. ° °THERAPY PROGRESS NOTE ° °Session Time: 60 Minutes ° °Participation Level: Active ° °Behavioral Response: Well GroomedAlertAnxious ° °Type of Therapy: Individual Therapy ° °Treatment Goals addressed: Anxiety and Coping ° °Interventions: CBT ° °Summary: Penny Kim is a 49 y.o. female who presents with depression and anxiety sxs. Pt was on time for session and appeared more focused than last two sessions. Pt reported that she was feeling better today and has started gathering and reading different books for inspiration to start writing her own. Pt continues to enjoy family time on Sundays. Pt reported that for so long she considered what others’ think and has had a need for acceptance, however now acknowledges that this is not a value of hers she wants to prioritize in her life. Pt identified top 5 values as family, friendship, health, safety, and comfort.  ° °Suicidal/Homicidal: No ° °Therapist Response: Therapist met with patient for follow up session. Therapist and patient reviewed patient progress in re-engaging socially and pursuing interests. Therapist engaged patient in values sort card activity. Pt was receptive. Therapist and patient to revisit top 5 values next week and brainstorm SMART goals - at least one for each value. ° °Plan: Return again in 1 week. ° °Diagnosis: Axis I: Social Anxiety and MDD, Recurrent, Moderate ° °  Axis II:  ° °Lauren P O'Reilly, LCSW, LCAS °06/25/2020 ° °

## 2020-07-02 ENCOUNTER — Other Ambulatory Visit: Payer: Self-pay

## 2020-07-02 ENCOUNTER — Ambulatory Visit: Payer: Medicaid Other | Admitting: Licensed Clinical Social Worker

## 2020-07-08 ENCOUNTER — Telehealth: Payer: Self-pay | Admitting: Physician Assistant

## 2020-07-08 ENCOUNTER — Ambulatory Visit: Payer: Medicaid Other | Admitting: Physician Assistant

## 2020-07-08 NOTE — Progress Notes (Deleted)
Established patient visit   Patient: Penny Kim   DOB: [birth year]   49 y.o. Female  MRN: [id number] Visit Date: 07/08/2020  Today's healthcare provider: Trinna Post, PA-C   No chief complaint on file.  Subjective    HPI  Hypertension, follow-up  BP Readings from Last 3 Encounters:  01/23/20 134/84  11/23/19 (!) 151/84  11/22/19 138/88   Wt Readings from Last 3 Encounters:  01/23/20 146 lb (66.2 kg)  11/23/19 150 lb (68 kg)  11/22/19 153 lb 12.8 oz (69.8 kg)     She was last seen for hypertension more than 1 years ago.  BP at that visit was see above. Management since that visit includes no change.  She reports good compliance with treatment. She is not having side effects.  She is following a {diet:21022986} diet. She {is/is not:9024} exercising. She {does/does not:200015} smoke.  Use of agents associated with hypertension: none.   Outside blood pressures are {***enter patient reported home BP readings, or 'not being checked':1}. Symptoms: No chest pain No chest pressure  No palpitations No syncope  No dyspnea No orthopnea  No paroxysmal nocturnal dyspnea No lower extremity edema   Pertinent labs: Lab Results  Component Value Date   CHOL 203 (H) 09/01/2017   HDL 73 09/01/2017   LDLCALC 112 (H) 09/01/2017   LDLDIRECT 124 (H) 09/01/2017   TRIG 90 09/01/2017   CHOLHDL 4 08/06/2016   Lab Results  Component Value Date   NA 136 01/23/2020   K 3.4 (L) 01/23/2020   CREATININE 1.05 (H) 01/23/2020   GFRNONAA 63 01/23/2020   GFRAA 73 01/23/2020   GLUCOSE 102 (H) 01/23/2020     The 10-year ASCVD risk score Mikey Bussing DC Jr., et al., 2013) is: 2.6%   --------------------------------------------------------------------------------------------------- Follow up for Anxiety & Depression:  The patient was last seen for this 8 months ago. Changes made at last visit include referred to psych.  She reports good compliance with treatment. She feels that  condition is {improved/worse/unchanged:3041574}. She is not having side effects.   -----------------------------------------------------------------------------------------    {Show patient history (optional):23778::" "}   Medications: Outpatient Medications Prior to Visit  Medication Sig  . amLODipine (NORVASC) 10 MG tablet Take 1 tablet (10 mg total) by mouth daily.  . busPIRone (BUSPAR) 10 MG tablet Take 2 tablets (20 mg total) by mouth 3 (three) times daily.  . clobetasol ointment (TEMOVATE) 0.93 % Apply 1 application topically 2 (two) times daily.  . cyclobenzaprine (FLEXERIL) 10 MG tablet Take 10 mg by mouth at bedtime.  . fluticasone (FLONASE) 50 MCG/ACT nasal spray SPRAY 2 SPRAYS INTO EACH NOSTRIL EVERY DAY  . hydrochlorothiazide (MICROZIDE) 12.5 MG capsule TAKE 1 CAPSULE BY MOUTH EVERY DAY  . tamoxifen (NOLVADEX) 20 MG tablet Take 1 tablet (20 mg total) by mouth daily.  . traMADol (ULTRAM) 50 MG tablet Take 1 tablet (50 mg total) by mouth every 8 (eight) hours as needed.  . traZODone (DESYREL) 100 MG tablet TAKE 1 TABLET BY MOUTH EVERYDAY AT BEDTIME  . vortioxetine HBr (TRINTELLIX) 20 MG TABS tablet Take 1 tablet (20 mg total) by mouth daily.   No facility-administered medications prior to visit.    Review of Systems  Constitutional: Negative.   Respiratory: Negative.   Cardiovascular: Negative.   Musculoskeletal: Negative.   Psychiatric/Behavioral: Negative.     {Heme  Chem  Endocrine  Serology  Results Review (optional):23779::" "}  Objective    There were no vitals taken for  this visit. {Show previous vital signs (optional):23777::" "}  Physical Exam  ***  No results found for any visits on 07/08/20.  Assessment & Plan     ***  No follow-ups on file.      {provider attestation***:1}   [name]  [facility] [phone] (phone) [phone] (fax)  [facility]

## 2020-07-08 NOTE — Telephone Encounter (Signed)
Dismissal letter sent due to seven no shows.

## 2020-07-15 ENCOUNTER — Other Ambulatory Visit: Payer: Self-pay

## 2020-07-15 ENCOUNTER — Other Ambulatory Visit: Payer: Self-pay | Admitting: Physician Assistant

## 2020-07-15 ENCOUNTER — Ambulatory Visit (INDEPENDENT_AMBULATORY_CARE_PROVIDER_SITE_OTHER): Payer: Medicare Other | Admitting: Physician Assistant

## 2020-07-15 ENCOUNTER — Encounter: Payer: Self-pay | Admitting: Physician Assistant

## 2020-07-15 VITALS — BP 128/80 | HR 83 | Temp 98.4°F | Resp 16 | Ht 64.5 in | Wt 138.0 lb

## 2020-07-15 DIAGNOSIS — G4700 Insomnia, unspecified: Secondary | ICD-10-CM | POA: Diagnosis not present

## 2020-07-15 DIAGNOSIS — R070 Pain in throat: Secondary | ICD-10-CM | POA: Diagnosis not present

## 2020-07-15 DIAGNOSIS — M8700 Idiopathic aseptic necrosis of unspecified bone: Secondary | ICD-10-CM

## 2020-07-15 DIAGNOSIS — M87059 Idiopathic aseptic necrosis of unspecified femur: Secondary | ICD-10-CM | POA: Diagnosis not present

## 2020-07-15 DIAGNOSIS — Z23 Encounter for immunization: Secondary | ICD-10-CM | POA: Diagnosis not present

## 2020-07-15 DIAGNOSIS — R29818 Other symptoms and signs involving the nervous system: Secondary | ICD-10-CM | POA: Diagnosis not present

## 2020-07-15 DIAGNOSIS — M25552 Pain in left hip: Secondary | ICD-10-CM

## 2020-07-15 MED ORDER — MELOXICAM 7.5 MG PO TABS: 7.5000 mg | ORAL_TABLET | Freq: Every day | ORAL | 0 refills | Status: DC

## 2020-07-15 MED ORDER — METHOCARBAMOL 500 MG PO TABS: 500.0000 mg | ORAL_TABLET | Freq: Two times a day (BID) | ORAL | 0 refills | Status: AC | PRN

## 2020-07-15 NOTE — Patient Instructions (Signed)
Here are some primary care recommendations for you:  [facility]

## 2020-07-15 NOTE — Telephone Encounter (Signed)
Requested medication (s) are due for refill today:  No  Requested medication (s) are on the active medication list:  No  Future visit scheduled:  Yes - today at 2:00 PM  Last Refill: both medications were d/c'd 04/29/20 by [facility] Provider; "completed course"  Notes to clinic:  pt. Has an appt. Today for f/u/ medication refills; Methocarbamol is not delegated.  Requested Prescriptions  Pending Prescriptions Disp Refills   meloxicam (MOBIC) 7.5 MG tablet [Pharmacy Med Name: MELOXICAM 7.5 MG TABLET] 90 tablet 0    Sig: TAKE 1 TABLET BY MOUTH EVERY DAY      Analgesics:  COX2 Inhibitors Failed - 07/15/2020 12:27 PM      Failed - Cr in normal range and within 360 days    Creatinine  Date Value Ref Range Status  10/13/2015 1.0 0.6 - 1.1 mg/dL Final   Creatinine, Ser  Date Value Ref Range Status  01/23/2020 1.05 (H) 0.57 - 1.00 mg/dL Final          Passed - HGB in normal range and within 360 days    Hemoglobin  Date Value Ref Range Status  01/23/2020 11.6 11.1 - 15.9 g/dL Final   HGB  Date Value Ref Range Status  10/13/2015 12.9 11.6 - 15.9 g/dL Final          Passed - Patient is not pregnant      Passed - Valid encounter within last 12 months    Recent Outpatient Visits           5 months ago Hip pain   [place], [place], PA-C   7 months ago Avascular necrosis of bone [facility])   [place], [place], PA-C   8 months ago Major depressive disorder, recurrent episode, moderate [facility])   [facility] [place], [place], PA-C   9 months ago No-show for appointment   [facility] Carles Collet M, PA-C   1 year ago Avascular necrosis of bone [facility])   [place], Wendee [place], [place]       Future Appointments             Today Trinna Post, PA-C [place]              methocarbamol (ROBAXIN) 500 MG tablet [Pharmacy Med Name:  METHOCARBAMOL 500 MG TABLET] 30 tablet 0    Sig: TAKE 1 TABLET (500 MG TOTAL) BY MOUTH 2 (TWO) TIMES DAILY AS NEEDED FOR MUSCLE SPASMS.      Not Delegated - Analgesics:  Muscle Relaxants Failed - 07/15/2020 12:27 PM      Failed - This refill cannot be delegated      Passed - Valid encounter within last 6 months    Recent Outpatient Visits           5 months ago Hip pain   [place], [place], [place]   7 months ago Avascular necrosis of bone [facility])   [place], [place], [place]   8 months ago Major depressive disorder, recurrent episode, moderate [facility])   [facility] Carles Collet M, [place]   9 months ago No-show for appointment   [place], PA-C   1 year ago Avascular necrosis of bone [facility])   [facility] Trinna Post, [place]       Future Appointments  Today Trinna Post, PA-C [facility], [facility]

## 2020-07-15 NOTE — Progress Notes (Signed)
Established patient visit   Patient: Penny Kim   DOB: 1970/12/01   49 y.o. Female  MRN: [id number] Visit Date: 07/15/2020  Today's healthcare provider: Trinna Post, PA-C   Chief Complaint  Patient presents with  . Depression  . Insomnia   Subjective    HPI  Depression, Follow-up  She  was last seen for this 8 months ago. Changes made at last visit include no changes.   She reports good compliance with treatment. She is not having side effects.   She reports good tolerance of treatment.  Depression screen [facility] 2/9 01/02/2018 09/22/2017 10/19/2016  Decreased Interest 1 3 0  Down, Depressed, Hopeless 2 3 0  PHQ - 2 Score 3 6 0  Altered sleeping 3 3 -  Tired, decreased energy 3 3 -  Change in appetite 3 2 -  Feeling bad or failure about yourself  3 3 -  Trouble concentrating 3 3 -  Moving slowly or fidgety/restless 0 3 -  Suicidal thoughts 1 0 -  PHQ-9 Score 19 23 -  Difficult doing work/chores Extremely dIfficult Extremely dIfficult -  Some recent data might be hidden    Patient also mentions that she has had trouble sleeping at night. She reports having trouble falling and staying asleep. She wakes up frequently. She has never been tested for sleep apnea. She thinks she may snore at night. She reports family members have told her she looks like she stops breathing. She is currently on trazodone to 100 mg QHS.   In the interim, patient was referred to pain management for avascular necrosis of her hips. She was referred to [facility] pain management back in 01/2020. However, she talked to her downstairs neighbor who told her all pain management does is give you pills, so she ended up not going to her appointments. She no showed a new patient appointment on three occasions and now there is a directive with [facility] to not reschedule her appointments with that clinic. She does not want to pursue any further pain management at this point.   Patient also reports throat pain from  what she believes are tonsil stones. She reports the pain is on her left side and she removed a tonsil stone recently. Denies fevers, chills, nausea, vomiting.      Medications: Outpatient Medications Prior to Visit  Medication Sig  . amLODipine (NORVASC) 10 MG tablet Take 1 tablet (10 mg total) by mouth daily.  . busPIRone (BUSPAR) 10 MG tablet Take 2 tablets (20 mg total) by mouth 3 (three) times daily.  . clobetasol ointment (TEMOVATE) 1.54 % Apply 1 application topically 2 (two) times daily.  . cyclobenzaprine (FLEXERIL) 10 MG tablet Take 10 mg by mouth at bedtime.  . fluticasone (FLONASE) 50 MCG/ACT nasal spray SPRAY 2 SPRAYS INTO EACH NOSTRIL EVERY DAY  . hydrochlorothiazide (MICROZIDE) 12.5 MG capsule TAKE 1 CAPSULE BY MOUTH EVERY DAY  . tamoxifen (NOLVADEX) 20 MG tablet Take 1 tablet (20 mg total) by mouth daily.  . traMADol (ULTRAM) 50 MG tablet Take 1 tablet (50 mg total) by mouth every 8 (eight) hours as needed.  . traZODone (DESYREL) 100 MG tablet TAKE 1 TABLET BY MOUTH EVERYDAY AT BEDTIME  . vortioxetine HBr (TRINTELLIX) 20 MG TABS tablet Take 1 tablet (20 mg total) by mouth daily.   No facility-administered medications prior to visit.    Review of Systems    Objective    BP 128/80   Pulse 83  Temp 98.4 F (36.9 C)   Resp 16   Ht 5' 4.5" (1.638 m)   Wt 138 lb (62.6 kg)   BMI 23.32 kg/m    Physical Exam HENT:     Mouth/Throat:     Lips: Pink.     Mouth: Mucous membranes are moist.     Tongue: No lesions.     Pharynx: Oropharynx is clear. No pharyngeal swelling or oropharyngeal exudate.     Tonsils: No tonsillar exudate. 0 on the right. 0 on the left.       No results found for any visits on 07/15/20.  Assessment & Plan     1. Insomnia, unspecified type  She does have some symptoms concerning for sleep apnea. Will order sleep study as below. Likely needs to be in lab due to insurance. Will order this. However, regarding medication management, she is  followed by psychiatry. She has an appointment on 07/22/2020 and I have reminded her of this.   Patient reports she received a letter from [place] but did not open it. Have told patient personally of her dismissal from this practice due to multiple no shows. We have had multiple discussions regarding this, strategies to improve compliance, have referred her to our Education officer, museum. Patient expresses understanding. List of other primary care providers given. Have counseled that we will provide care for 30 days after discharge, from 07/08/2020.   2. Need for immunization against influenza  Updated today.   3. Suspected sleep apnea  - Ambulatory referral to Sleep Studies  4. Avascular necrosis of bone of hip, unspecified laterality (HCC)  - meloxicam (MOBIC) 7.5 MG tablet; Take 1 tablet (7.5 mg total) by mouth daily.  Dispense: 30 tablet; Refill: 0  5. Throat Pain  Exam benign today. Discussed self limiting nature of tonsil stones and conservative management with salt water gargles. Offered ENT referral, patient declines.   Return if symptoms worsen or fail to improve.      ITrinna Post, PA-C, have reviewed all documentation for this visit. The documentation on 07/15/20 for the exam, diagnosis, procedures, and orders are all accurate and complete.  The entirety of the information documented in the History of Present Illness, Review of Systems and Physical Exam were personally obtained by me. Portions of this information were initially documented by Wilburt Finlay, CMA and reviewed by me for thoroughness and accuracy.     [name]  [facility] [phone] (phone) [phone] (fax)  [place]

## 2020-07-15 NOTE — Telephone Encounter (Signed)
Looks like this patient has been dismissed.

## 2020-07-17 ENCOUNTER — Inpatient Hospital Stay: Payer: Medicaid Other | Admitting: Oncology

## 2020-07-17 ENCOUNTER — Inpatient Hospital Stay: Payer: Medicaid Other

## 2020-07-22 ENCOUNTER — Other Ambulatory Visit: Payer: Self-pay

## 2020-07-22 ENCOUNTER — Telehealth (INDEPENDENT_AMBULATORY_CARE_PROVIDER_SITE_OTHER): Payer: Medicaid Other | Admitting: Psychiatry

## 2020-07-22 DIAGNOSIS — F331 Major depressive disorder, recurrent, moderate: Secondary | ICD-10-CM

## 2020-07-22 NOTE — Progress Notes (Signed)
Patient connected at the time of appointment, reported she was having car trouble and does not have enough battery available on her phone to talk to writer since she is pulled over at the side of the road.  Patient will call back to reschedule.  Will look at her medication and will refill medications to last her until her next appointment.

## 2020-07-24 ENCOUNTER — Telehealth: Payer: Self-pay | Admitting: Physician Assistant

## 2020-07-24 NOTE — Telephone Encounter (Signed)
Received records request FROM [facility], ATTORNEY AT LAW forwarded to [place]

## 2020-07-25 ENCOUNTER — Inpatient Hospital Stay: Payer: Medicaid Other | Admitting: Oncology

## 2020-07-25 ENCOUNTER — Inpatient Hospital Stay: Payer: Medicaid Other

## 2020-08-08 ENCOUNTER — Inpatient Hospital Stay: Payer: Medicaid Other

## 2020-08-08 ENCOUNTER — Inpatient Hospital Stay: Payer: Medicaid Other | Admitting: Oncology

## 2020-08-18 NOTE — Addendum Note (Signed)
Addended by: Wilburt Finlay on: 08/18/2020 11:22 AM   Modules accepted: Orders

## 2020-08-19 ENCOUNTER — Telehealth: Payer: Self-pay | Admitting: Psychiatry

## 2020-08-19 NOTE — Telephone Encounter (Signed)
We will have [place] fax mental health questionnaire to [facility] attorney

## 2020-08-19 NOTE — Telephone Encounter (Signed)
I have completed mental health impairment questionnaire from [place].  We will have [place] fax it to them.

## 2020-08-20 NOTE — Telephone Encounter (Signed)
Forms were faxed and confirmed yesterday 08-19-20.  Forms put in scan basket to have scanned into system

## 2020-08-21 ENCOUNTER — Inpatient Hospital Stay: Payer: Medicaid Other

## 2020-08-21 ENCOUNTER — Inpatient Hospital Stay: Payer: Medicaid Other | Admitting: Oncology

## 2020-08-25 ENCOUNTER — Other Ambulatory Visit: Payer: Self-pay | Admitting: Physician Assistant

## 2020-08-25 DIAGNOSIS — M87059 Idiopathic aseptic necrosis of unspecified femur: Secondary | ICD-10-CM

## 2020-08-27 ENCOUNTER — Encounter: Payer: Self-pay | Admitting: Licensed Clinical Social Worker

## 2020-08-27 ENCOUNTER — Other Ambulatory Visit: Payer: Self-pay

## 2020-08-27 ENCOUNTER — Ambulatory Visit (INDEPENDENT_AMBULATORY_CARE_PROVIDER_SITE_OTHER): Payer: Medicaid Other | Admitting: Licensed Clinical Social Worker

## 2020-08-27 DIAGNOSIS — F4010 Social phobia, unspecified: Secondary | ICD-10-CM

## 2020-08-27 DIAGNOSIS — Z5329 Procedure and treatment not carried out because of patient's decision for other reasons: Secondary | ICD-10-CM

## 2020-08-27 DIAGNOSIS — Z91199 Patient's noncompliance with other medical treatment and regimen due to unspecified reason: Secondary | ICD-10-CM

## 2020-08-27 DIAGNOSIS — F331 Major depressive disorder, recurrent, moderate: Secondary | ICD-10-CM

## 2020-08-27 NOTE — Progress Notes (Signed)
Virtual Visit via Video Note  I connected with [name] on 08/27/20 at  2:00 PM EST by a video enabled telemedicine application and verified that I am speaking with the correct person using two identifiers.  Participating Parties Patient Provider  Location: Patient: Home Provider: Home Office   I discussed the limitations of evaluation and management by telemedicine and the availability of in person appointments. The patient expressed understanding and agreed to proceed.  THERAPY PROGRESS NOTE  Session Time: 39 Minutes  Participation Level: Active  Behavioral Response: Well GroomedAlertAnxious  Type of Therapy: Individual Therapy  Treatment Goals addressed: Anxiety and Coping  Interventions: CBT  Summary: Penny Kim is a 49 y.o. female who presents with depression and anxiety sxs. Pt is returning to therapy after about a 2 month gap. Pt reported her anxiety has stayed about the same and "shut down" the whole month of October. Pt reported she stopped going to family dinners, not engaging in hygiene routines, and feeling defeated for not following through on her commitments. Pt reported she was anxious in anticipation for today's appointment and fought the urge to cancel/not show up. Pt reported depression sxs are still present, but not as "dark and heavy". Pt identified SMART goals for values she listed in last session. Pt reported she wants to stay connected with family and has been for the past month. Pt reported she would like to reach out and mend relationships with old friends as well as seek new opportunities for friendship. Pt identified ways she can care for herself and overall health. Pt reported desire to find a new apartment and neighborhood she can feel safe in. Pt reported she also wants to expand her comfort zone and recognizes this will mean challenging her anxious thoughts and urges to isolate. Pt reported having each of these facets of her life improved would help  with her sense of self-worth. Pt acknowledged pattern of self-sabotage by measuring "everything with money".  Suicidal/Homicidal: No  Therapist Response: Therapist met with patient for follow up session. Therapist and patient reviewed treatment plan update, including progress and barriers. Therapist and patient revisited discussion of values and completed SMART goals. Therapist validated patient feelings/concerns. Therapist and patient processed feelings and thoughts.  Plan: Return again in 3 weeks.  Diagnosis: Axis I: Social Anxiety and MDD, Recurrent, Moderate    Axis II: N/A  Penny Igo, LCSW, LCAS 08/27/2020  Follow Up Instructions:  I discussed the treatment plan update with the patient. The patient was provided an opportunity to ask questions and all were answered. The patient agreed with the plan and demonstrated an understanding of the instructions.   The patient was advised to call back or seek an in-person evaluation if the symptoms worsen or if the condition fails to improve as anticipated.  I provided 60 minutes of non-face-to-face time during this encounter.  Penny Nack Wynelle Link, LCSW, LCAS

## 2020-08-29 ENCOUNTER — Telehealth: Payer: Self-pay

## 2020-08-29 DIAGNOSIS — F4010 Social phobia, unspecified: Secondary | ICD-10-CM

## 2020-08-29 MED ORDER — CLONAZEPAM 0.5 MG PO TABS: 0.5000 mg | ORAL_TABLET | Freq: Every day | ORAL | 1 refills | Status: DC | PRN

## 2020-08-29 NOTE — Telephone Encounter (Signed)
I have sent Klonopin to pharmacy. 

## 2020-08-29 NOTE — Telephone Encounter (Signed)
pt called states she needs a refill on her klonopin 

## 2020-08-31 NOTE — Progress Notes (Signed)
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     Since her last visit, she has experienced multiple falls. Head CT performed on 11/23/2019 was negative.  She also underwent lumbar spine MRI on 03/28/2020 for back pain. This showed: L4-5 left foraminal extrusion impinging on L4 nerve root, likely the symptomatic finding; L2-3 and L3-4 disc degeneration without impingement.   REVIEW OF SYSTEMS: Penny Kim    COVID 19 VACCINATION STATUS:    HISTORY OF CURRENT ILLNESS: From the original intake note:  Penny Kim had noticed that her right breast dropped a little differently than the left, and occasionally she would have a strange sensation radiating to the right nipple, but she really did not pay much attention to this. She had her first mammogram ever, a screening mammogram at [facility], 04/23/2010, and this showed diffuse calcifications in the lower inner quadrant of the right breast measuring up to 5.6 cm. She was recalled 08/08, and Dr. March Kim found the breast to be dense, which limits mammographic sensitivity, but again was able to demonstrate these calcifications throughout the lower inner quadrant of the right breast. Biopsy was discussed, and performed 08/09, and this showed [phone]) ductal carcinoma in situ, high grade, which was estrogen receptor 100% and  progesterone receptor 57% positive.  Bilateral breast MRIs were obtained 08/14. This showed in the right breast the area of enhancement to total 7.7 cm anterior posteriorly. Most of this was nodular and non-mass like, but in addition in the anterior aspect of this, there was a rounded area of mass-like enhancement measuring 1.7 cm. This portion was suspicious for invasive ductal carcinoma. The left breast was unremarkable, and there were no suspicious internal mammary or axillary lymph nodes noted.  Accordingly the patient was brought back for biopsy of the more mass-like area on 05/05/2010, and this biopsy ([id number]) showed a grade 2 invasive ductal carcinoma, which was estrogen receptor at 78% and progesterone receptor 82% positive. The proliferation marker was elevated at 85%. 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  Her subsequent history is as detailed below.   PAST MEDICAL HISTORY: Past Medical History:  Diagnosis Date   Allergy    Anxiety    Breast cancer, right breast ([place]) 02/2010   s/p chemo (completed 09/2010) and right mastectomy w/reconstruction   Depression    Family history of breast cancer    Family history of lung cancer    Family history of ovarian cancer    History of stress test    a. treadmill Myoview 11/2016: small defect of mild severity present in the apex location felt to be secondary to breast attenuation. EF 55-65%. Normal study   HTN (hypertension)    Orthostatic hypotension    Psoriasis    Systolic dysfunction    a. TTE 10/2016: EF 50-55%, no RWMA, normal LV diastolic function, left atrium normal, RV systolic function normal, PASP normal    PAST SURGICAL HISTORY: Past Surgical History:  Procedure Laterality Date   BREAST RECONSTRUCTION  01/06/2012   Procedure: BREAST RECONSTRUCTION;  Surgeon: Crissie Reese, MD;  Location: [facility];  Service: Plastics;  Laterality: Bilateral;  bilateral  removal of tissue expanders, bilateral placement of implants--Breast   BREAST SURGERY  06/19/2010   exploration of right mastectomy site due to post-op bleeding   INSERTION OF TISSUE EXPANDER AFTER MASTECTOMY  06/19/2010   bilat. mastectomies with bilat. 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 The patients mother died in 12-09-2015 at age 34. She has a significant history of depression. The patient has one sister, also with a history of depression, who was diagnosed with ovarian cancer at age 62.   GYNECOLOGIC HISTORY:  No LMP recorded. (Menstrual status: Other). GX P 0 LMP irregular following chemotherapy (since Penny 21, 2015) Hysterectomy? no BSO? no   SOCIAL HISTORY: (updated 06/2019)  Penny Kim is currently on disability.When asked who she would call in case of a problem, she really does not have anyone that she would call. She does not feel her family is supportive, and she has no close friends.     ADVANCED DIRECTIVES: not in place   HEALTH MAINTENANCE: Social History   Tobacco Use    Smoking status: Never Smoker   Smokeless tobacco: Never Used  Vaping Use   Vaping Use: Never used  Substance Use Topics   Alcohol use: No    Comment: occasionally   Drug use: No     Colonoscopy: n/a  PAP: 11/2017, negative  Bone density: n/a   Allergies  Allergen Reactions   Prochlorperazine Other (See Comments)    SYNCOPE SYNCOPE   Methadone Hcl Itching   Other    Propranolol     rash   Tape Rash    Current Outpatient Medications  Medication Sig Dispense Refill   amLODipine (NORVASC) 10 MG tablet Take 1 tablet (10 mg total) by mouth daily. 90 tablet 3   busPIRone (BUSPAR) 10 MG tablet Take 2 tablets (20 mg total) by mouth 3 (three) times daily. 540 tablet 0   clobetasol ointment (TEMOVATE) 1.60 % Apply 1 application topically 2 (two) times daily. 60 g 3   clonazePAM (KLONOPIN) 0.5 MG tablet Take 1 tablet (0.5 mg total) by mouth daily as needed. 30 tablet 1   cyclobenzaprine (FLEXERIL) 10 MG tablet Take 10 mg by mouth at bedtime.  fluticasone (FLONASE) 50 MCG/ACT nasal spray SPRAY 2 SPRAYS INTO EACH NOSTRIL EVERY DAY 48 mL 0   hydrochlorothiazide (MICROZIDE) 12.5 MG capsule TAKE 1 CAPSULE BY MOUTH EVERY DAY 90 capsule 3   meloxicam (MOBIC) 7.5 MG tablet Take 1 tablet (7.5 mg total) by mouth daily. 30 tablet 0   tamoxifen (NOLVADEX) 20 MG tablet Take 1 tablet (20 mg total) by mouth daily. 90 tablet 4   traMADol (ULTRAM) 50 MG tablet Take 1 tablet (50 mg total) by mouth every 8 (eight) hours as needed. 6 tablet 0   traZODone (DESYREL) 100 MG tablet TAKE 1 TABLET BY MOUTH EVERYDAY AT BEDTIME 90 tablet 0   vortioxetine HBr (TRINTELLIX) 20 MG TABS tablet Take 1 tablet (20 mg total) by mouth daily. 90 tablet 1   No current facility-administered medications for this visit.    OBJECTIVE:   There were no vitals filed for this visit.   There is no height or weight on file to calculate BMI.   Wt Readings from Last 3 Encounters:  07/15/20 138 lb (62.6 kg)   01/23/20 146 lb (66.2 kg)  11/23/19 150 lb (68 kg)      ECOG FS:   LAB RESULTS:  CMP     Component Value Date/Time   NA 136 01/23/2020 1419   NA 138 10/13/2015 1406   K 3.4 (L) 01/23/2020 1419   K 3.6 10/13/2015 1406   CL 96 01/23/2020 1419   CL 105 01/30/2013 1313   CO2 22 01/23/2020 1419   CO2 27 10/13/2015 1406   GLUCOSE 102 (H) 01/23/2020 1419   GLUCOSE 115 (H) 11/23/2019 1229   GLUCOSE 114 10/13/2015 1406   GLUCOSE 94 01/30/2013 1313   BUN 9 01/23/2020 1419   BUN 10.2 10/13/2015 1406   CREATININE 1.05 (H) 01/23/2020 1419   CREATININE 1.0 10/13/2015 1406   CALCIUM 10.0 01/23/2020 1419   CALCIUM 9.5 10/13/2015 1406   PROT 7.8 01/23/2020 1419   PROT 7.4 10/13/2015 1406   ALBUMIN 4.7 01/23/2020 1419   ALBUMIN 4.0 10/13/2015 1406   AST 24 01/23/2020 1419   AST 22 10/13/2015 1406   ALT 15 01/23/2020 1419   ALT 15 10/13/2015 1406   ALKPHOS 48 01/23/2020 1419   ALKPHOS 38 (L) 10/13/2015 1406   BILITOT 0.3 01/23/2020 1419   BILITOT <0.30 10/13/2015 1406   GFRNONAA 63 01/23/2020 1419   GFRAA 73 01/23/2020 1419    Lab Results  Component Value Date   WBC 7.9 01/23/2020   NEUTROABS 5.1 01/23/2020   HGB 11.6 01/23/2020   HCT 34.5 01/23/2020   MCV 81 01/23/2020   PLT 291 01/23/2020   Lab Results  Component Value Date   LABCA2 11 09/25/2012    No components found for: OLIDCV013  No results for input(s): INR in the last 168 hours.  Lab Results  Component Value Date   LABCA2 11 09/25/2012    No results found for: HYH888  No results found for: LNZ972  No results found for: QAS601  No results found for: CA2729  No components found for: HGQUANT  No results found for: CEA1 / No results found for: CEA1   No results found for: AFPTUMOR  No results found for: CHROMOGRNA  No results found for: TOTALPROTELP, ALBUMINELP, A1GS, A2GS, BETS, BETA2SER, GAMS, MSPIKE, SPEI (this displays SPEP labs)  No results found for: KPAFRELGTCHN, LAMBDASER,  KAPLAMBRATIO (kappa/lambda light chains)  No results found for: HGBA, HGBA2QUANT, HGBFQUANT, HGBSQUAN (Hemoglobinopathy evaluation)   No results  found for: LDH  No results found for: IRON, TIBC, IRONPCTSAT (Iron and TIBC)  No results found for: FERRITIN  Urinalysis    Component Value Date/Time   COLORURINE YELLOW (A) 11/17/2019 2054   APPEARANCEUR CLEAR (A) 11/17/2019 2054   LABSPEC 1.014 11/17/2019 2054   LABSPEC 1.005 08/07/2013 1155   PHURINE 6.0 11/17/2019 2054   GLUCOSEU NEGATIVE 11/17/2019 2054   GLUCOSEU Negative 08/07/2013 1155   HGBUR NEGATIVE 11/17/2019 2054   BILIRUBINUR NEGATIVE 11/17/2019 2054   BILIRUBINUR Negative 08/07/2013 [place] 11/17/2019 2054   PROTEINUR NEGATIVE 11/17/2019 2054   UROBILINOGEN 0.2 08/07/2013 1155   NITRITE NEGATIVE 11/17/2019 2054   LEUKOCYTESUR NEGATIVE 11/17/2019 2054   LEUKOCYTESUR Negative 08/07/2013 1155    STUDIES: No results found.   ASSESSMENT: 49 y.o. Penny Kim woman with a BRCA 2 mutation of uncertain significance:  (1) status post bilateral mastectomies September 2011 for a right-sided T2 N0, stage IIAinvasive ductal carcinoma,grade 3,estrogen and progesterone receptor positive, HER2 negative with an elevated MIB-1,   (2) treated with 4 cycles of adjuvant doxorubicin/cyclophosphamidegiven in dose-dense fashion and 4 weekly doses of paclitaxelof 12 planned, discontinued because of neuropathy.   (3) She did not require radiation.   (4) She started tamoxifenin Penny 2012          (a) stopped tamoxifen January 2018  (b) resumed tamoxifen January 2019  (5) genetic testing on 06/27/2018  (a) Previous BRCA2 VUS was reclassified to favor polymorphism on 05/06/2014.  The remainder of the BRCA1 and BRCA2 genes were negative through [facility].  No BART.   (b)Updated genetic testing 07/05/2018-through the [facility] gene panel offered by [employer] found no deleterious  mutations in APC, ATM, BARD1, BMPR1A, BRCA1, BRCA2, BRIP1, CHD1, CDK4, CDKN2A, CHEK2, EPCAM (large rearrangement only), GREM1, HOXB13, AXIN2, MSH3, NTHL1, RNF43, GALNT12, RSP20, MLH1, MSH2, MSH6, MUTYH, NBN, PALB2, PMS2, PTEN, RAD51C, RAD51D, SMAD4, STK11, and TP53. Sequencing was performed for select regions of POLE and POLD1, and large rearrangement analysis was performed for select regions of GREM1.   PLAN: Penny Kim did not show for her 09/01/2020 appointment.  A follow-up letter has been mailed.  Chauncey Cruel, MD   09/01/2020 5:35 PM Medical Oncology and Hematology [facility] [place], [place] [address] Tel. [phone]    Fax. [phone]   I, Wilburn Mylar, am acting as scribe for Dr. Virgie Dad. Penny Kim.  I, Lurline Del MD, have reviewed the above documentation for accuracy and completeness, and I agree with the above.   *Total Encounter Time as defined by the Centers for Medicare and Medicaid Services includes, in addition to the face-to-face time of a patient visit (documented in the note above) non-face-to-face time: obtaining and reviewing outside history, ordering and reviewing medications, tests or procedures, care coordination (communications with other health care professionals or caregivers) and documentation in the medical record.

## 2020-09-01 ENCOUNTER — Encounter: Payer: Self-pay | Admitting: Oncology

## 2020-09-01 ENCOUNTER — Inpatient Hospital Stay: Payer: Medicaid Other | Attending: Physician Assistant

## 2020-09-01 ENCOUNTER — Inpatient Hospital Stay (HOSPITAL_BASED_OUTPATIENT_CLINIC_OR_DEPARTMENT_OTHER): Payer: Medicaid Other | Admitting: Oncology

## 2020-09-01 DIAGNOSIS — C50211 Malignant neoplasm of upper-inner quadrant of right female breast: Secondary | ICD-10-CM

## 2020-09-03 ENCOUNTER — Encounter: Payer: Self-pay | Admitting: Psychiatry

## 2020-09-03 ENCOUNTER — Other Ambulatory Visit: Payer: Self-pay

## 2020-09-03 ENCOUNTER — Telehealth (INDEPENDENT_AMBULATORY_CARE_PROVIDER_SITE_OTHER): Payer: Medicaid Other | Admitting: Psychiatry

## 2020-09-03 DIAGNOSIS — F4010 Social phobia, unspecified: Secondary | ICD-10-CM

## 2020-09-03 DIAGNOSIS — G4701 Insomnia due to medical condition: Secondary | ICD-10-CM | POA: Diagnosis not present

## 2020-09-03 DIAGNOSIS — F3341 Major depressive disorder, recurrent, in partial remission: Secondary | ICD-10-CM

## 2020-09-03 DIAGNOSIS — Z8659 Personal history of other mental and behavioral disorders: Secondary | ICD-10-CM | POA: Diagnosis not present

## 2020-09-03 DIAGNOSIS — F411 Generalized anxiety disorder: Secondary | ICD-10-CM

## 2020-09-03 MED ORDER — BUSPIRONE HCL 10 MG PO TABS: 20.0000 mg | ORAL_TABLET | Freq: Three times a day (TID) | ORAL | 0 refills | Status: AC

## 2020-09-03 MED ORDER — TRAZODONE HCL 100 MG PO TABS: ORAL_TABLET | ORAL | 0 refills | Status: DC

## 2020-09-03 NOTE — Progress Notes (Signed)
Virtual Visit via Video Note  I connected with Penny Kim on 09/03/20 at 11:20 AM EST by a video enabled telemedicine application and verified that I am speaking with the correct person using two identifiers.  Location Provider Location : [facility] Patient Location : Home  Participants: Patient , Provider    I discussed the limitations of evaluation and management by telemedicine and the availability of in person appointments. The patient expressed understanding and agreed to proceed.   I discussed the assessment and treatment plan with the patient. The patient was provided an opportunity to ask questions and all were answered. The patient agreed with the plan and demonstrated an understanding of the instructions.   The patient was advised to call back or seek an in-person evaluation if the symptoms worsen or if the condition fails to improve as anticipated.   [place] MD OP Progress Note  09/03/2020 12:43 PM Penny Kim  MRN:  [id number]  Chief Complaint:  Chief Complaint    Follow-up     HPI: Penny Kim is a 49 year old African-American female, single, lives in [place], has a history of MDD, GAD, social anxiety disorder, insomnia, history of OCD, history of PTSD, avascular necrosis of bone, history of breast cancer currently on tamoxifen, history of bilateral mastectomy, left hip pain, allergic rhinitis was evaluated by telemedicine today.  Patient today reports she is trying to do the best during the holiday season.  She plans to spend her Christmas with her family.  She reports the Trintellix is beneficial for her mood.  She reports as long as she takes it she believes she will be okay.  She denies side effects.  Patient does report sleep is okay.  She however reports that she has nightmares at least couple of times a week.  She however reports it does not affect her overall sleep.    She reports she continues to have anxiety symptoms on a regular basis however overall she is  coping with it better than before.  She takes the Klonopin every other day or so.  That helps.  Patient denies any suicidality, homicidality or perceptual disturbances.  Patient denies any other concerns today.  Visit Diagnosis:    ICD-10-CM   1. MDD (major depressive disorder), recurrent, in partial remission ([place])  F33.41   2. GAD (generalized anxiety disorder)  F41.1   3. Social anxiety disorder  F40.10 busPIRone (BUSPAR) 10 MG tablet  4. Insomnia due to medical condition  G47.01 traZODone (DESYREL) 100 MG tablet   pain, anxiety  5. History of OCD (obsessive compulsive disorder)  Z86.59 busPIRone (BUSPAR) 10 MG tablet    Past Psychiatric History: I have reviewed past psychiatric history from my progress note on 12/10/2019.  Past trials of Prozac, Paxil, Zoloft, Xanax, Ambien, melatonin  Past Medical History:  Past Medical History:  Diagnosis Date  . Allergy   . Anxiety   . Breast cancer, right breast ([place]) 02/2010   s/p chemo (completed 09/2010) and right mastectomy w/reconstruction  . Depression   . Family history of breast cancer   . Family history of lung cancer   . Family history of ovarian cancer   . History of stress test    a. treadmill Myoview 11/2016: small defect of mild severity present in the apex location felt to be secondary to breast attenuation. EF 55-65%. Normal study  . HTN (hypertension)   . Orthostatic hypotension   . Psoriasis   . Systolic dysfunction    a. TTE  10/2016: EF 50-55%, no RWMA, normal LV diastolic function, left atrium normal, RV systolic function normal, PASP normal    Past Surgical History:  Procedure Laterality Date  . BREAST RECONSTRUCTION  01/06/2012   Procedure: BREAST RECONSTRUCTION;  Surgeon: Crissie Reese, MD;  Location: [facility];  Service: Plastics;  Laterality: Bilateral;  bilateral removal of tissue expanders, bilateral placement of implants--Breast  . BREAST SURGERY  06/19/2010   exploration of right mastectomy site  due to post-op bleeding  . INSERTION OF TISSUE EXPANDER AFTER MASTECTOMY  06/19/2010   bilat. mastectomies with bilat. tissue expanders  . PORT-A-CATH REMOVAL  11/26/2010  . PORTACATH PLACEMENT  07/23/2010  . TISSUE EXPANDER REMOVAL  10/20/2010   left    Family Psychiatric History: I have reviewed family psychiatric history from my progress note on 12/10/2019.  Family History:  Family History  Problem Relation Age of Onset  . Hypertension Mother   . Depression Mother   . Heart attack Mother   . Hyperlipidemia Mother   . Cancer Sister 30       Ovarian cancer  . Hypertension Maternal Grandmother   . Diabetes Maternal Grandmother   . 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 Hypertension Maternal Grandfather   . Diabetes Paternal Grandmother   . Hypertension Paternal Grandmother   . Kidney disease Paternal Grandmother        End stage renal dz - dialysis  . Hyperlipidemia Paternal Grandmother   . Breast cancer Paternal Grandmother   . Alcohol abuse Father   . Depression Father   . Drug abuse Father 27       Died of drug overdose    Social History: I have reviewed social history from my progress note on 12/10/2019. Social History   Socioeconomic History  . Marital status: Single    Spouse name: Not on file  . Number of children: Not on file  . Years of education: 28  . Highest education level: Not on file  Occupational History  . Occupation: Land    Comment: [employer]  Tobacco Use  . Smoking status: Never Smoker  . Smokeless tobacco: Never Used  Vaping Use  . Vaping Use: Never used  Substance and Sexual Activity  . Alcohol use: No    Comment: occasionally  . Drug use: No  . Sexual activity: Not Currently    Birth control/protection: None  Other Topics Concern  . Not on file  Social History Narrative   Penny Kim [place], [place]. She attended [employer] where  she obtained her Bachelors of Science degree in Psychology. 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 She currently lives in [place]. She lives alone. Penny Kim enjoys attending live musical performances. 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    Social Determinants of Health   Financial Resource Strain: Not on file  Food Insecurity: Not on file  Transportation Needs: Not on file  Physical Activity: Not on file  Stress: Not on file  Social Connections: Not on file    Allergies:  Allergies  Allergen Reactions  . Prochlorperazine Other (See Comments)    SYNCOPE SYNCOPE  . Methadone Hcl Itching  . Other   . Propranolol     rash  . Tape Rash    Metabolic Disorder Labs: Lab Results  Component  Value Date   HGBA1C 5.9 (H) 01/23/2020   No results found for: PROLACTIN Lab Results  Component Value Date   CHOL 203 (H) 09/01/2017   TRIG 90 09/01/2017   HDL 73 09/01/2017   CHOLHDL 4 08/06/2016   VLDL 34.4 08/06/2016   LDLCALC 112 (H) 09/01/2017   LDLCALC 154 (H) 08/06/2016   Lab Results  Component Value Date   TSH 4.320 01/23/2020   TSH 2.780 09/01/2017    Therapeutic Level Labs: No results found for: LITHIUM No results found for: VALPROATE No components found for:  CBMZ  Current Medications: Current Outpatient Medications  Medication Sig Dispense Refill  . amLODipine (NORVASC) 10 MG tablet Take 1 tablet (10 mg total) by mouth daily. 90 tablet 3  . busPIRone (BUSPAR) 10 MG tablet Take 2 tablets (20 mg total) by mouth 3 (three) times daily. 540 tablet 0  . clobetasol ointment (TEMOVATE) 6.14 % Apply 1 application topically 2 (two) times daily. 60 g 3  . clonazePAM (KLONOPIN) 0.5 MG tablet Take 1 tablet (0.5 mg total) by mouth daily as needed. 30 tablet 1  . cyclobenzaprine (FLEXERIL) 10 MG tablet Take 10 mg by mouth at bedtime.    . fluticasone (FLONASE) 50 MCG/ACT nasal spray SPRAY 2 SPRAYS  INTO EACH NOSTRIL EVERY DAY 48 mL 0  . hydrochlorothiazide (MICROZIDE) 12.5 MG capsule TAKE 1 CAPSULE BY MOUTH EVERY DAY 90 capsule 3  . meloxicam (MOBIC) 7.5 MG tablet Take 1 tablet (7.5 mg total) by mouth daily. 30 tablet 0  . tamoxifen (NOLVADEX) 20 MG tablet Take 1 tablet (20 mg total) by mouth daily. 90 tablet 4  . traMADol (ULTRAM) 50 MG tablet Take 1 tablet (50 mg total) by mouth every 8 (eight) hours as needed. 6 tablet 0  . traZODone (DESYREL) 100 MG tablet TAKE 1 TABLET BY MOUTH EVERYDAY AT BEDTIME 90 tablet 0  . vortioxetine HBr (TRINTELLIX) 20 MG TABS tablet Take 1 tablet (20 mg total) by mouth daily. 90 tablet 1   No current facility-administered medications for this visit.     Musculoskeletal: Strength & Muscle Tone: UTA Gait & Station: UTA Patient leans: N/A  Psychiatric Specialty Exam: Review of Systems  Psychiatric/Behavioral: The patient is nervous/anxious.   All other systems reviewed and are negative.   There were no vitals taken for this visit.There is no height or weight on file to calculate BMI.  General Appearance: Casual  Eye Contact:  Fair  Speech:  Clear and Coherent  Volume:  Normal  Mood:  Anxious  Affect:  Congruent  Thought Process:  Goal Directed and Descriptions of Associations: Intact  Orientation:  Full (Time, Place, and Person)  Thought Content: Logical   Suicidal Thoughts:  No  Homicidal Thoughts:  No  Memory:  Immediate;   Fair Recent;   Fair Remote;   Fair  Judgement:  Fair  Insight:  Fair  Psychomotor Activity:  Normal  Concentration:  Concentration: Fair and Attention Span: Fair  Recall:  [employer] of Knowledge: Fair  Language: Fair  Akathisia:  No  Handed:  Right  AIMS (if indicated): UTA  Assets:  Communication Skills Desire for Improvement Housing Social Support  ADL's:  Intact  Cognition: WNL  Sleep:  Fair, does report nightmares however it does not affect her sleep.   Screenings: [facility]  Visit from 12/30/2017 in [facility] Visit from 09/22/2017 in [facility] Office Visit from 10/19/2016 in [facility]  [facility] Visit from 08/27/2015 in [facility]  PHQ-2 Total Score 3 6 0 6  PHQ-9 Total Score 19 23 -- 21       Assessment and Plan: JANYCE ELLINGER is a 49 year old African-American female, single, has a history of OCD, MDD, history of PTSD, social anxiety disorder, multiple medical problems including history of breast cancer status post bilateral mastectomy on tamoxifen was evaluated by telemedicine today.  Patient is currently making progress with regards to her mood however continues to struggle with anxiety although coping better.  Discussed plan as noted below.  Plan MDD-in partial remission Trintellix 20 mg p.o. daily BuSpar as prescribed  GAD-improving BuSpar 20 mg p.o. 3 times daily Continue CBT. Continue Klonopin as needed for severe panic attacks only.  Patient is aware about the long-term risk of being on benzodiazepine therapy and will limit use.  Insomnia-multifactorial including pain-hot flashes from tamoxifen-stable Trazodone 100 mg p.o. nightly Patient with nightmares advised to keep a dream journal.  She will also talk to her therapist. If nightmares are distressing, would consider adding prazosin.  Follow-up in clinic in 1 month or sooner if needed.  I have spent atleast 20 minutes face to face by video with patient today. More than 50 % of the time was spent for preparing to see the patient ( e.g., review of test, records ), ordering medications and test ,psychoeducation and supportive psychotherapy and care coordination,as well as documenting clinical information in electronic health record. This note was generated in part or whole with voice recognition software. Voice recognition is usually quite accurate but there are transcription errors that can and very often do occur. I apologize  for any typographical errors that were not detected and corrected.        Ursula Alert, MD 09/03/2020, 12:43 PM

## 2020-09-17 ENCOUNTER — Other Ambulatory Visit: Payer: Self-pay

## 2020-09-17 ENCOUNTER — Encounter: Payer: Self-pay | Admitting: Licensed Clinical Social Worker

## 2020-09-17 ENCOUNTER — Ambulatory Visit (INDEPENDENT_AMBULATORY_CARE_PROVIDER_SITE_OTHER): Payer: Medicaid Other | Admitting: Licensed Clinical Social Worker

## 2020-09-17 DIAGNOSIS — F4010 Social phobia, unspecified: Secondary | ICD-10-CM

## 2020-09-17 DIAGNOSIS — F331 Major depressive disorder, recurrent, moderate: Secondary | ICD-10-CM | POA: Diagnosis not present

## 2020-09-17 NOTE — Progress Notes (Signed)
Virtual Visit via Telephone Note  I connected with Penny Kim on 09/17/20 at  2:00 PM EST by telephone and verified that I am speaking with the correct person using two identifiers.  Participating Parties Patient Provider  Location: Patient: Home Provider: Home Office   I attempted to connect with patient via video session, however audio was not working and had to be moved to over the phone. I discussed the limitations, risks, security and privacy concerns of performing an evaluation and management service by telephone and the availability of in person appointments. I also discussed with the patient that there may be a patient responsible charge related to this service. The patient expressed understanding and agreed to proceed.  THERAPY PROGRESS NOTE  Session Time: 6 Minutes  Participation Level: Active  Behavioral Response: CasualAlertAnxious  Type of Therapy: Individual Therapy  Treatment Goals addressed: Anxiety and Coping  Interventions: CBT  Summary: Penny Kim is a 49 y.o. female who presents with depression and anxiety sxs. Pt reported that she has been continuing to attend family dinners every Sunday and spent time with family for the holidays. Pt reported "I wasn't able to express appropriately how I was feeling" and described an interaction she had with family member that left her feeling "mistreated and couldn't tell if I was being irrational". Pt reported part of the issue communicating with family involves lack of confidence in self and feeling like an "outsider" worried about being judged by the family. Pt reported last Wednesday before Christmas "I had a dark spell of crying and mourning. It took me the whole morning to get through it". Pt believed this was triggered by grief and loss and anxiety over upcoming family social events. 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  Suicidal/Homicidal: No  Therapist  Response: Therapist met with patient for follow up session. Therapist and patient explored thoughts, feelings and reactions to stressors and reviewed coping skills utilized as well as progress towards goals since last session. Therapist validated patient feelings/concerns and offered alternative perspectives on interaction with family members. Pt was receptive.  Plan: Return again in 2 weeks.  Diagnosis: Axis I: Social Anxiety and MDD, Recurrent, Moderate    Axis II: N/A  Josephine Igo, LCSW, LCAS 09/17/2020

## 2020-09-19 ENCOUNTER — Other Ambulatory Visit: Payer: Self-pay | Admitting: Physician Assistant

## 2020-09-19 DIAGNOSIS — M87059 Idiopathic aseptic necrosis of unspecified femur: Secondary | ICD-10-CM

## 2020-10-02 ENCOUNTER — Ambulatory Visit (INDEPENDENT_AMBULATORY_CARE_PROVIDER_SITE_OTHER): Payer: Medicaid Other | Admitting: Licensed Clinical Social Worker

## 2020-10-02 ENCOUNTER — Telehealth (INDEPENDENT_AMBULATORY_CARE_PROVIDER_SITE_OTHER): Payer: Medicaid Other | Admitting: Psychiatry

## 2020-10-02 ENCOUNTER — Encounter: Payer: Self-pay | Admitting: Licensed Clinical Social Worker

## 2020-10-02 ENCOUNTER — Other Ambulatory Visit: Payer: Self-pay

## 2020-10-02 ENCOUNTER — Encounter: Payer: Self-pay | Admitting: Psychiatry

## 2020-10-02 DIAGNOSIS — F3341 Major depressive disorder, recurrent, in partial remission: Secondary | ICD-10-CM | POA: Insufficient documentation

## 2020-10-02 DIAGNOSIS — F331 Major depressive disorder, recurrent, moderate: Secondary | ICD-10-CM

## 2020-10-02 DIAGNOSIS — F4010 Social phobia, unspecified: Secondary | ICD-10-CM

## 2020-10-02 DIAGNOSIS — Z8659 Personal history of other mental and behavioral disorders: Secondary | ICD-10-CM

## 2020-10-02 DIAGNOSIS — G4700 Insomnia, unspecified: Secondary | ICD-10-CM

## 2020-10-02 DIAGNOSIS — G4701 Insomnia due to medical condition: Secondary | ICD-10-CM

## 2020-10-02 DIAGNOSIS — F411 Generalized anxiety disorder: Secondary | ICD-10-CM

## 2020-10-02 MED ORDER — PRAZOSIN HCL 1 MG PO CAPS: 1.0000 mg | ORAL_CAPSULE | Freq: Every day | ORAL | 1 refills | Status: DC

## 2020-10-02 MED ORDER — TRAZODONE HCL 100 MG PO TABS: 150.0000 mg | ORAL_TABLET | Freq: Every day | ORAL | 0 refills | Status: DC
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
## 2020-10-02 NOTE — Progress Notes (Signed)
Virtual Visit via Video Note  I connected with Penny Kim on 10/02/20 at 10:00 AM EST by a video enabled telemedicine application and verified that I am speaking with the correct person using two identifiers.  Participating Parties Patient Provider  Location: Patient: Home Provider: Home Office   I discussed the limitations of evaluation and management by telemedicine and the availability of in person appointments. The patient expressed understanding and agreed to proceed.  Comprehensive Clinical Re-Assessment (CCA) Note  10/02/2020 Penny Kim [id number]  Chief Complaint:  Chief Complaint  Patient presents with  . Anxiety  . Depression   Visit Diagnosis:  Social Anxiety Hx of OCD MDD, Recurrent, Moderate Insomnia  CCA Biopsychosocial Intake/Chief Complaint:  Pt presents as a 50 year old African-American female for re-assessment. Pt has been meeting with this therapist for the past 6 months to work through depression and anxiety sxs. Pt reported "the things that have gotten better would be how I am handling my anxiety, getting back into practices like deep breathing, more consciously aware of behavior shifts, my ability to show up for appointments, and the medications take a big edge off the depression". Pt reported she continues to struggle with social anxiety, fatigue, loneliness, and self-deprecating thoughts such as "I am a failure".  Current Symptoms/Problems: Anxiety, Depression, Isolation, Low Self-Esteem, Fatigue, Chronic Pain   Patient Reported Schizophrenia/Schizoaffective Diagnosis in Past: No   Strengths: Pt has gained insights, grown more comfortable talking with therapist, and reported that she is benefiting from therapy.  Preferences: Pt reported "be honest with me. As we progress, you will know me more. I have a degree in psychology, but have not been in the field since 2005. Be tough with me. I have to learn to monitor my behavior".  Abilities: Pt has  some insight into her illness and able to communicate needs. Pt hx of working in Clinical cytogeneticist.   Type of Services Patient Feels are Needed: Individual Therapy and continued Medication Management   Initial Clinical Notes/Concerns: Pt reported hx of suicide attempt in 2012 by taking tylenoI and bendryl with alcohol. Pt reported this was one of the "darkest" times in her life after going through chemotherapy. Pt denied current SI, plan or intent and any other suicide attempts since then.Pt reported "when I am not eating right or not taking my medications I can get dark".   Mental Health Symptoms Depression:  Difficulty Concentrating; Fatigue; Change in energy/activity; Sleep (too much or little); Irritability   Duration of Depressive symptoms: Greater than two weeks   Mania:  None   Anxiety:   Irritability; Restlessness; Worrying; Tension; Difficulty concentrating; Fatigue; Sleep   Psychosis:  None   Duration of Psychotic symptoms: No data recorded  Trauma:  N/A   Obsessions:  N/A   Compulsions:  N/A   Inattention:  Avoids/dislikes activities that require focus; Disorganized; Poor follow-through on tasks (Pt reporte tendency of "zoning out" and family members tell her it looks like "sleeping with my eyes open".)   Hyperactivity/Impulsivity:  Talks excessively; Fidgets with hands/feet; Feeling of restlessness   Oppositional/Defiant Behaviors:  N/A   Emotional Irregularity:  Intense/unstable relationships; Mood lability; Unstable self-image   Other Mood/Personality Symptoms:  No data recorded   Mental Status Exam Appearance and self-care  Stature:  Average   Weight:  Average weight   Clothing:  Casual   Grooming:  Well-groomed   Cosmetic use:  Age appropriate   Posture/gait:  Normal   Motor activity:  Restless; Agitated  Sensorium  Attention:  Distractible   Concentration:  Anxiety interferes; Focuses on irrelevancies; Scattered   Orientation:   X5   Recall/memory:  Normal   Affect and Mood  Affect:  Anxious   Mood:  Anxious   Relating  Eye contact:  Normal   Facial expression:  Anxious   Attitude toward examiner:  Cooperative   Thought and Language  Speech flow: Flight of Ideas; Pressured   Thought content:  Appropriate to Mood and Circumstances   Preoccupation:  Ruminations   Hallucinations:  None   Organization:  No data recorded  [employer] of Knowledge:  Good   Intelligence:  Above Average   Abstraction:  Normal   Judgement:  Fair   Reality Testing:  Adequate   Insight:  Flashes of insight   Decision Making:  Normal   Social Functioning  Social Maturity:  Isolates   Social Judgement:  Normal   Stress  Stressors:  Family conflict; Grief/losses; Financial; Transitions   Coping Ability:  Overwhelmed   Skill Deficits:  Communication; Interpersonal   Supports:  Friends/Service system; Family (Pt reported that she goes to church for fellowship, music, and is intrigued by some of the sermons)     Religion: Religion/Spirituality Are You A Religious Person?: Yes (Pt reported "I was a devout christian as a youth and through my 34s. After reading the [place] Code I understood that Christianity was man made".)  Leisure/Recreation: Leisure / Recreation Do You Have Hobbies?: Yes Leisure and Hobbies: Writing, making music, and reading  Exercise/Diet: Exercise/Diet Do You Exercise?: No Have You Gained or Lost A Significant Amount of Weight in the Past Six Months?: No Do You Follow a Special Diet?: No Do You Have Any Trouble Sleeping?: Yes Explanation of Sleeping Difficulties: Pt reported "my breath cuts off" causing her to wake up several times during the night.   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 Does patient have children?: No  Childhood History:  Childhood History By whom was/is the patient raised?: Mother,Grandparents Additional childhood history information: Father died when patient was young Description of patient's relationship with caregiver when they were a child: Pt reported "I was just kind of ignored". Patient's description of current relationship with people who raised him/her: Mother is deceased. How were you disciplined when you got in trouble as a child/adolescent?: Pt reported "grandmother would yell at me". Does patient have siblings?: Yes Did patient suffer any verbal/emotional/physical/sexual abuse as a child?: No Did patient suffer from severe childhood neglect?: No Has patient ever been sexually abused/assaulted/raped as an adolescent or adult?: No Was the patient ever a victim of a crime or a disaster?: No Witnessed domestic violence?: No Has patient been affected by domestic violence as an adult?: No       CCA Substance Use Alcohol/Drug Use: Alcohol / Drug Use Pain Medications: SEE MAR Prescriptions: SEE  MAR Over the Counter: SEE MAR History of alcohol / drug use?: Yes (Pt reported "every now and then I think about wanting to smoke a joint, but I don't". Pt reported hx of smoking marijuana at times during her 80s.)                          Recommendations for  Services/Supports/Treatments: Recommendations for Services/Supports/Treatments Recommendations For Services/Supports/Treatments: Individual Therapy,Medication Management  DSM5 Diagnoses: Patient Active Problem List   Diagnosis Date Noted  . History of OCD (obsessive compulsive disorder) 09/03/2020  . GAD (generalized anxiety disorder) 06/18/2020  . No-show for appointment 02/05/2020  . Social anxiety disorder 01/07/2020  . Benzodiazepine dependence ([place]) 12/10/2019  . Insomnia 12/10/2019  . Obsessive-compulsive disorder 12/10/2019  . Moderate episode of recurrent major depressive disorder ([place]) 06/08/2019  . Family history of breast cancer   . Family history of lung cancer   . Family history of ovarian cancer   . Rash 10/19/2016  . Malignant neoplasm of upper-inner quadrant of right breast in female, estrogen receptor positive ([place]) 10/11/2016  . Prediabetes 08/06/2016  . Syncope 08/06/2016  . Genetic testing 10/07/2015  . Hyperlipidemia 09/11/2015  . Acquired absence of both breasts and nipples 04/22/2015  . Personal history of breast cancer 04/22/2015  . Avascular necrosis of bone of hip ([place]) 11/22/2014  . Essential hypertension 07/30/2013  . Anxiety and depression 07/30/2013  . Breast cancer of upper-inner quadrant of right female breast ([place]) 08/24/2011    Patient Centered Plan: Patient is on the following Treatment Plan(s):  Anxiety and Depression   Follow Up Instructions:    I discussed the assessment and treatment plan updates with the patient. The patient was provided an opportunity to ask questions and all were answered. The patient agreed with the plan and demonstrated an understanding of the instructions.   The patient was advised to call back or seek an in-person evaluation if the symptoms worsen or if the condition fails to improve as anticipated.  I provided 60 minutes of non-face-to-face time during this encounter.  Jameel Quant Wynelle Link, LCSW, LCAS

## 2020-10-02 NOTE — Patient Instructions (Signed)
Prazosin Oral Capsules What is this medicine? PRAZOSIN (PRA zoe sin) is an alpha blocker. It treats high blood pressure. This medicine may be used for other purposes; ask your health care provider or pharmacist if you have questions. COMMON BRAND NAME(S): Minipress What should I tell my health care provider before I take this medicine? They need to know if you have any of the following conditions:  kidney disease  an unusual or allergic reaction to prazosin, other medicines, foods, dyes, or preservatives  pregnant or trying to get pregnant  breast-feeding How should I use this medicine? Take this drug by mouth. Take it as directed on the prescription label at the same time every day. Keep taking it unless your health care provider tells you to stop. Talk to your health care provider about the use of this drug in children. Special care may be needed. Overdosage: If you think you have taken too much of this medicine contact a poison control center or emergency room at once. NOTE: This medicine is only for you. Do not share this medicine with others. What if I miss a dose? If you miss a dose, take it as soon as you can. If it is almost time for your next dose, take only that dose. Do not take double or extra doses. What may interact with this medicine?  diuretics  medicines for high blood pressure  sildenafil  tadalafil  vardenafil This list may not describe all possible interactions. Give your health care provider a list of all the medicines, herbs, non-prescription drugs, or dietary supplements you use. Also tell them if you smoke, drink alcohol, or use illegal drugs. Some items may interact with your medicine. What should I watch for while using this medicine? Visit your doctor or health care professional for regular checks on your progress. Check your blood pressure regularly. Ask your doctor or health care professional what your blood pressure should be and when you should  contact him or her. Drowsiness and dizziness are more likely to occur after the first dose, after an increase in dose, or during hot weather or exercise. These effects can decrease once your body adjusts to this medicine. Do not drive, use machinery, or do anything that needs mental alertness until you know how this drug affects you. Do not stand or sit up quickly, especially if you are an older patient. This reduces the risk of dizzy or fainting spells. Alcohol can make you more drowsy and dizzy. Avoid alcoholic drinks. Do not treat yourself for coughs, colds or allergies without asking your doctor or health care professional for advice. Some ingredients can increase your blood pressure. Your mouth may get dry. Chewing sugarless gum or sucking hard candy, and drinking plenty of water may help. Contact your doctor if the problem does not go away or is severe. For males, contact your doctor or health care professional right away if you have an erection that lasts longer than 4 hours or if it becomes painful. This may be a sign of a serious problem and must be treated right away to prevent permanent damage. What side effects may I notice from receiving this medicine? Side effects that you should report to your doctor or health care professional as soon as possible:  blurred vision  difficulty breathing, shortness of breath  fainting spells, light headedness  fast or irregular heartbeat, palpitations or chest pain  numbness in hands or feet  prolonged painful erection of the penis  swelling of the legs or   ankles  unusually weak or tired Side effects that usually do not require medical attention (report to your doctor or health care professional if they continue or are bothersome):  constipation or diarrhea  headache  nausea  sexual difficulties  stomach pain This list may not describe all possible side effects. Call your doctor for medical advice about side effects. You may report side  effects to FDA at 1-800-FDA-1088. Where should I keep my medicine? Keep out of the reach of children and pets. Store at room temperature between 15 and 30 degrees C (59 and 86 degrees F). Throw away any unused drug after the expiration date. NOTE: This sheet is a summary. It may not cover all possible information. If you have questions about this medicine, talk to your doctor, pharmacist, or health care provider.  2021 [employer] Standard (2019-05-15 20:15:05)  

## 2020-10-16 ENCOUNTER — Ambulatory Visit: Payer: Medicaid Other | Admitting: Licensed Clinical Social Worker

## 2020-10-16 ENCOUNTER — Other Ambulatory Visit: Payer: Self-pay

## 2020-10-22 ENCOUNTER — Other Ambulatory Visit: Payer: Self-pay

## 2020-10-22 ENCOUNTER — Telehealth (INDEPENDENT_AMBULATORY_CARE_PROVIDER_SITE_OTHER): Payer: Medicaid Other | Admitting: Psychiatry

## 2020-10-22 DIAGNOSIS — Z5329 Procedure and treatment not carried out because of patient's decision for other reasons: Secondary | ICD-10-CM

## 2020-10-22 NOTE — Progress Notes (Signed)
No response to call or text or video invite  

## 2020-10-25 ENCOUNTER — Other Ambulatory Visit: Payer: Self-pay | Admitting: Psychiatry

## 2020-10-25 DIAGNOSIS — G4701 Insomnia due to medical condition: Secondary | ICD-10-CM

## 2020-10-28 ENCOUNTER — Encounter: Payer: Self-pay | Admitting: Psychiatry

## 2020-10-28 ENCOUNTER — Telehealth (HOSPITAL_COMMUNITY): Payer: Self-pay | Admitting: Licensed Clinical Social Worker

## 2020-10-28 ENCOUNTER — Telehealth (INDEPENDENT_AMBULATORY_CARE_PROVIDER_SITE_OTHER): Payer: Medicare Other | Admitting: Psychiatry

## 2020-10-28 ENCOUNTER — Other Ambulatory Visit: Payer: Self-pay

## 2020-10-28 DIAGNOSIS — F4310 Post-traumatic stress disorder, unspecified: Secondary | ICD-10-CM | POA: Insufficient documentation

## 2020-10-28 DIAGNOSIS — Z8659 Personal history of other mental and behavioral disorders: Secondary | ICD-10-CM

## 2020-10-28 DIAGNOSIS — G4701 Insomnia due to medical condition: Secondary | ICD-10-CM | POA: Diagnosis not present

## 2020-10-28 DIAGNOSIS — F411 Generalized anxiety disorder: Secondary | ICD-10-CM

## 2020-10-28 DIAGNOSIS — F4010 Social phobia, unspecified: Secondary | ICD-10-CM

## 2020-10-28 DIAGNOSIS — F331 Major depressive disorder, recurrent, moderate: Secondary | ICD-10-CM

## 2020-10-28 MED ORDER — ZOLPIDEM TARTRATE 5 MG PO TABS: 5.0000 mg | ORAL_TABLET | Freq: Every evening | ORAL | 0 refills | Status: DC | PRN

## 2020-10-28 MED ORDER — PRAZOSIN HCL 2 MG PO CAPS: 2.0000 mg | ORAL_CAPSULE | Freq: Every day | ORAL | 1 refills | Status: DC

## 2020-10-28 NOTE — Patient Instructions (Signed)
Zolpidem Tablets What is this medicine? ZOLPIDEM (zole PI dem) is used to treat insomnia. This medicine helps you to fall asleep and sleep through the night. This medicine may be used for other purposes; ask your health care provider or pharmacist if you have questions. COMMON BRAND NAME(S): Ambien What should I tell my health care provider before I take this medicine? They need to know if you have any of these conditions:  depression  history of drug abuse or addiction  if you often drink alcohol  liver disease  lung or breathing disease  myasthenia gravis  sleep apnea  sleep-walking, driving, eating or other activity while not fully awake after taking a sleep medicine  suicidal thoughts, plans, or attempt; a previous suicide attempt by you or a family member  an unusual or allergic reaction to zolpidem, other medicines, foods, dyes, or preservatives  pregnant or trying to get pregnant  breast-feeding How should I use this medicine? Take this medicine by mouth with a glass of water. Follow the directions on the prescription label. It is better to take this medicine on an empty stomach and only when you are ready for bed. Do not take your medicine more often than directed. If you have been taking this medicine for several weeks and suddenly stop taking it, you may get unpleasant withdrawal symptoms. Your doctor or health care professional may want to gradually reduce the dose. Do not stop taking this medicine on your own. Always follow your doctor or health care professional's advice. A special MedGuide will be given to you by the pharmacist with each prescription and refill. Be sure to read this information carefully each time. Talk to your pediatrician regarding the use of this medicine in children. Special care may be needed. Overdosage: If you think you have taken too much of this medicine contact a poison control center or emergency room at once. NOTE: This medicine is only  for you. Do not share this medicine with others. What if I miss a dose? This does not apply. This medication should only be taken immediately before going to sleep. Do not take double or extra doses. What may interact with this medicine?  alcohol  antihistamines for allergy, cough and cold  certain medicines for anxiety or sleep  certain medicines for depression, like amitriptyline, fluoxetine, sertraline  certain medicines for fungal infections like ketoconazole and itraconazole  certain medicines for seizures like phenobarbital, primidone  ciprofloxacin  dietary supplements for sleep, like valerian or kava kava  general anesthetics like halothane, isoflurane, methoxyflurane, propofol  local anesthetics like lidocaine, pramoxine, tetracaine  medicines that relax muscles for surgery  narcotic medicines for pain  phenothiazines like chlorpromazine, mesoridazine, prochlorperazine, thioridazine  rifampin This list may not describe all possible interactions. Give your health care provider a list of all the medicines, herbs, non-prescription drugs, or dietary supplements you use. Also tell them if you smoke, drink alcohol, or use illegal drugs. Some items may interact with your medicine. What should I watch for while using this medicine? Visit your doctor or health care professional for regular checks on your progress. Keep a regular sleep schedule by going to bed at about the same time each night. Avoid caffeine-containing drinks in the evening hours. When sleep medicines are used every night for more than a few weeks, they may stop working. Talk to your doctor if you still have trouble sleeping. After taking this medicine, you may get up out of bed and do an activity that you  do not know you are doing. The next morning, you may have no memory of this. Activities include driving a car ("sleep-driving"), making and eating food, talking on the phone, sexual activity, and sleep-walking.  Serious injuries have occurred. Stop the medicine and call your doctor right away if you find out you have done any of these activities. Do not take this medicine if you have used alcohol that evening. Do not take it if you have taken another medicine for sleep. The risk of doing these sleep-related activities is higher. Wait for at least 8 hours after you take a dose before driving or doing other activities that require full mental alertness. Do not take this medicine unless you are able to stay in bed for a full night (7 to 8 hours) before you must be active again. You may have a decrease in mental alertness the day after use, even if you feel that you are fully awake. Tell your doctor if you will need to perform activities requiring full alertness, such as driving, the next day. Do not stand or sit up quickly after taking this medicine, especially if you are an older patient. This reduces the risk of dizzy or fainting spells. If you or your family notice any changes in your behavior, such as new or worsening depression, thoughts of harming yourself, anxiety, other unusual or disturbing thoughts, or memory loss, call your doctor right away. After you stop taking this medicine, you may have trouble falling asleep. This is called rebound insomnia. This problem usually goes away on its own after 1 or 2 nights. What side effects may I notice from receiving this medicine? Side effects that you should report to your doctor or health care professional as soon as possible:  allergic reactions like skin rash, itching or hives, swelling of the face, lips, or tongue  breathing problems  changes in vision  confusion  depressed mood or other changes in moods or emotions  feeling faint or lightheaded, falls  hallucinations  loss of balance or coordination  loss of memory  numbness or tingling of the tongue  restlessness, excitability, or feelings of anxiety or agitation  signs and symptoms of liver  injury like dark yellow or brown urine; general ill feeling or flu-like symptoms; light-colored stools; loss of appetite; nausea; right upper belly pain; unusually weak or tired; yellowing of the eyes or skin  suicidal thoughts  unusual activities while not fully awake like driving, eating, making phone calls, or sexual activity Side effects that usually do not require medical attention (report to your doctor or health care professional if they continue or are bothersome):  dizziness  drowsiness the day after you take this medicine  headache This list may not describe all possible side effects. Call your doctor for medical advice about side effects. You may report side effects to FDA at 1-800-FDA-1088. Where should I keep my medicine? Keep out of the reach of children. This medicine can be abused. Keep your medicine in a safe place to protect it from theft. Do not share this medicine with anyone. Selling or giving away this medicine is dangerous and against the law. This medicine may cause accidental overdose and death if taken by other adults, children, or pets. Mix any unused medicine with a substance like cat litter or coffee grounds. Then throw the medicine away in a sealed container like a sealed bag or a coffee can with a lid. Do not use the medicine after the expiration date. Store at   room temperature between 20 and 25 degrees C (68 and 77 degrees F). NOTE: This sheet is a summary. It may not cover all possible information. If you have questions about this medicine, talk to your doctor, pharmacist, or health care provider.  2021 [employer] Standard (2020-08-29 09:54:34)
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
## 2020-11-03 ENCOUNTER — Telehealth: Payer: Self-pay

## 2020-11-03 ENCOUNTER — Other Ambulatory Visit: Payer: Self-pay | Admitting: Psychiatry

## 2020-11-03 DIAGNOSIS — F4010 Social phobia, unspecified: Secondary | ICD-10-CM

## 2020-11-03 MED ORDER — CLONAZEPAM 0.5 MG PO TABS: 0.5000 mg | ORAL_TABLET | Freq: Every day | ORAL | 0 refills | Status: DC | PRN

## 2020-11-03 NOTE — Telephone Encounter (Signed)
I have sent Klonopin to pharmacy.  However patient has been noncompliant with recommendation-was referred to partial hospitalization program recently . Per notes per PHP contact person patient is not responding to any calls.  We will have Janett Billow CMA contact patient to advise her of the need to be compliant with recommendations.  And to discuss clinic policy.

## 2020-11-03 NOTE — Telephone Encounter (Signed)
Thank you for letting me know

## 2020-11-03 NOTE — Telephone Encounter (Signed)
pt called states she needs a refill on her klonopin

## 2020-11-03 NOTE — Telephone Encounter (Signed)
i spoke with patient and she stated that she would do the partial hospitation and she got to call the place for the sleep study back they called her today but she didnt get in intime to call back.

## 2020-11-04 NOTE — Telephone Encounter (Signed)
pt called left message about how to get the partial hospitalization set up.  who does she need to call.

## 2020-11-04 NOTE — Telephone Encounter (Signed)
[phone]  [phone]- Ms.Jenny Edminson with [facility]-please advise patient to call this person.  Please ask her to call us back if she is unable to get a hold of this person.

## 2020-11-06 NOTE — Telephone Encounter (Signed)
Thank you :)

## 2020-11-06 NOTE — Telephone Encounter (Signed)
Pt called back I told pt to try again tomorrow and that I was sending Mrs. Edminson a email asking her to contact her for the partial hospitalization. I told her if Mrs. Edminson did not call by tomorrow afternoon to call me and let me know.

## 2020-11-07 ENCOUNTER — Telehealth (HOSPITAL_COMMUNITY): Payer: Self-pay | Admitting: Licensed Clinical Social Worker

## 2020-11-09 ENCOUNTER — Other Ambulatory Visit: Payer: Self-pay | Admitting: Oncology

## 2020-11-11 ENCOUNTER — Ambulatory Visit (INDEPENDENT_AMBULATORY_CARE_PROVIDER_SITE_OTHER): Payer: Medicare Other | Admitting: Licensed Clinical Social Worker

## 2020-11-11 ENCOUNTER — Encounter: Payer: Self-pay | Admitting: Licensed Clinical Social Worker

## 2020-11-11 ENCOUNTER — Other Ambulatory Visit: Payer: Self-pay | Admitting: *Deleted

## 2020-11-11 ENCOUNTER — Other Ambulatory Visit: Payer: Self-pay

## 2020-11-11 DIAGNOSIS — Z8659 Personal history of other mental and behavioral disorders: Secondary | ICD-10-CM | POA: Diagnosis not present

## 2020-11-11 DIAGNOSIS — F332 Major depressive disorder, recurrent severe without psychotic features: Secondary | ICD-10-CM | POA: Diagnosis not present

## 2020-11-11 DIAGNOSIS — G4700 Insomnia, unspecified: Secondary | ICD-10-CM

## 2020-11-11 DIAGNOSIS — F4010 Social phobia, unspecified: Secondary | ICD-10-CM

## 2020-11-11 MED ORDER — HYDROCHLOROTHIAZIDE 12.5 MG PO CAPS: ORAL_CAPSULE | ORAL | 0 refills | Status: DC

## 2020-11-11 MED ORDER — AMLODIPINE BESYLATE 10 MG PO TABS: 10.0000 mg | ORAL_TABLET | Freq: Every day | ORAL | 0 refills | Status: DC
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
## 2020-11-12 ENCOUNTER — Telehealth (INDEPENDENT_AMBULATORY_CARE_PROVIDER_SITE_OTHER): Payer: Medicaid Other | Admitting: Psychiatry

## 2020-11-12 DIAGNOSIS — Z5329 Procedure and treatment not carried out because of patient's decision for other reasons: Secondary | ICD-10-CM

## 2020-11-12 NOTE — Progress Notes (Signed)
No response to call or text or video invite  Patient has been noncompliant with follow-up recommendations with writer for medication management.  Patient has been noncompliant with partial hospitalization program referral.  According to Ms. Lorin Glass with PHP ,patient was attempted to contact several times however she did not return her calls.  Patient was discussed clinic policy about noncompliance last visit and had voiced understanding.

## 2020-11-13 ENCOUNTER — Telehealth (HOSPITAL_COMMUNITY): Payer: Self-pay | Admitting: Licensed Clinical Social Worker

## 2020-11-13 NOTE — Telephone Encounter (Signed)
Cln oriented pt to virtual PHP and pt agrees to txt. Pt states she was released from Dr Charlcie Cradle care and has been from other providers as well due to forgetting appointments. Pt states poor memory is related to depression. Pt states having Medicare/Medicaid. Cln educated pt on program only accepting Medicare and that pt would be responsible for 20% of costs. Cln offered info for [facility] as they accept both of her insurances. Pt accepted information and states she will call them but wants to move forward with scheduling. Cln offered virtual intake appt on FRI 3/4 at 9am and pt accepted. Cln reviewed what to expect with a virtual appointment and confirmed email in chart as accurate.

## 2020-11-21 ENCOUNTER — Telehealth (HOSPITAL_COMMUNITY): Payer: Self-pay | Admitting: Professional

## 2020-11-21 ENCOUNTER — Ambulatory Visit (HOSPITAL_COMMUNITY): Payer: Self-pay | Admitting: Licensed Clinical Social Worker

## 2020-11-21 ENCOUNTER — Other Ambulatory Visit: Payer: Self-pay

## 2020-11-21 ENCOUNTER — Other Ambulatory Visit: Payer: Self-pay | Admitting: Psychiatry

## 2020-11-21 ENCOUNTER — Other Ambulatory Visit (HOSPITAL_COMMUNITY): Payer: Medicare Other | Attending: Psychiatry

## 2020-11-21 DIAGNOSIS — F4010 Social phobia, unspecified: Secondary | ICD-10-CM

## 2020-12-01 ENCOUNTER — Telehealth (HOSPITAL_COMMUNITY): Payer: Self-pay | Admitting: Licensed Clinical Social Worker

## 2020-12-02 ENCOUNTER — Ambulatory Visit (HOSPITAL_COMMUNITY): Payer: Medicare Other

## 2020-12-02 ENCOUNTER — Telehealth (HOSPITAL_COMMUNITY): Payer: Self-pay | Admitting: Licensed Clinical Social Worker

## 2020-12-02 NOTE — Telephone Encounter (Signed)
Cln called to remind pt of 10am appt as pt requested. Pt states she has trouble remembering appt's however is eager to engage in treatment. Cln called 10 min prior to appt and call went to VM. Cln left message stating reminder and that the invitation to the virtual appt was in pt's inbox. Cln states pt can join session through 10:15 and requested pt call if she encountered any issues with logging on to session or if pt wanted to reschedule appt and left number [phone].  Cln sent reminder email and waited in virtual session until 10:15. Pt did not present to session or return cln's call by that time.

## 2020-12-10 ENCOUNTER — Other Ambulatory Visit: Payer: Self-pay

## 2020-12-10 MED ORDER — AMLODIPINE BESYLATE 10 MG PO TABS: 10.0000 mg | ORAL_TABLET | Freq: Every day | ORAL | 0 refills | Status: DC

## 2020-12-10 NOTE — Telephone Encounter (Signed)
Received fax from [facility] [address] requesting refills for HCTZ 12.5 mg. Pt has not been seen in a year and will need follow-up appointment for refills.  Please call pt to schedule f/u appt. Thanks!

## 2020-12-11 NOTE — Telephone Encounter (Signed)
LVM for patient to schedule overdue fu w. End

## 2020-12-12 NOTE — Telephone Encounter (Signed)
LVM for patient to schedule overdue fu w. End

## 2020-12-17 MED ORDER — HYDROCHLOROTHIAZIDE 12.5 MG PO CAPS: ORAL_CAPSULE | ORAL | 0 refills | Status: DC

## 2020-12-18 NOTE — Telephone Encounter (Signed)
Unable to LVM, closing encounter after 3 attempts. Sending letter

## 2020-12-24 ENCOUNTER — Other Ambulatory Visit: Payer: Self-pay | Admitting: Psychiatry

## 2020-12-24 DIAGNOSIS — G4701 Insomnia due to medical condition: Secondary | ICD-10-CM

## 2020-12-30 ENCOUNTER — Other Ambulatory Visit: Payer: Self-pay | Admitting: Podiatry

## 2020-12-30 NOTE — Telephone Encounter (Signed)
Please advise. Thank you

## 2021-01-06 ENCOUNTER — Telehealth: Payer: Self-pay | Admitting: Oncology

## 2021-01-06 ENCOUNTER — Other Ambulatory Visit: Payer: Self-pay | Admitting: Psychiatry

## 2021-01-06 DIAGNOSIS — F4010 Social phobia, unspecified: Secondary | ICD-10-CM

## 2021-01-06 NOTE — Telephone Encounter (Signed)
Scheduled appt per 4/15 sch msg. Called pt, no answer. Left msg with appt date and time.  

## 2021-01-08 ENCOUNTER — Other Ambulatory Visit: Payer: Self-pay

## 2021-01-08 MED ORDER — AMLODIPINE BESYLATE 10 MG PO TABS: 10.0000 mg | ORAL_TABLET | Freq: Every day | ORAL | 0 refills | Status: DC

## 2021-01-12 ENCOUNTER — Other Ambulatory Visit: Payer: Self-pay | Admitting: *Deleted

## 2021-01-12 MED ORDER — HYDROCHLOROTHIAZIDE 12.5 MG PO CAPS: ORAL_CAPSULE | ORAL | 0 refills | Status: DC

## 2021-01-21 ENCOUNTER — Ambulatory Visit: Payer: Medicare Other | Admitting: Internal Medicine

## 2021-01-21 NOTE — Progress Notes (Deleted)
Follow-up Outpatient Visit Date: 01/21/2021  Primary Care Provider: Patient, No Pcp Per (Inactive) No address on file  Chief Complaint: ***  HPI:  Penny Kim is a 50 y.o. female with history of orthostatic hypotension with syncope, hypertension, right-sided breast cancer status post bilateral mastectomies and chemotherapy (2012), anxiety, and depression, who presents for follow-up of hypertension.  She was previously followed in our office by Dr. Yvone Neu and was last seen by Ignacia Bayley, NP, in 10/2019.  At that time, she reported blood pressures that were mildly elevated.  She also complained of chronic chest, hip, and right lower leg pain.  Amlodipine was increased to 10 mg daily.  No further work-up was recommended.  --------------------------------------------------------------------------------------------------  Past Medical History:  Diagnosis Date  . Allergy   . Anxiety   . Breast cancer, right breast ([place]) 02/2010   s/p chemo (completed 09/2010) and right mastectomy w/reconstruction  . Depression   . Family history of breast cancer   . Family history of lung cancer   . Family history of ovarian cancer   . History of stress test    a. treadmill Myoview 11/2016: small defect of mild severity present in the apex location felt to be secondary to breast attenuation. EF 55-65%. Normal study  . HTN (hypertension)   . Orthostatic hypotension   . Psoriasis   . Systolic dysfunction    a. TTE 10/2016: EF 50-55%, no RWMA, normal LV diastolic function, left atrium normal, RV systolic function normal, PASP normal   Past Surgical History:  Procedure Laterality Date  . BREAST RECONSTRUCTION  01/06/2012   Procedure: BREAST RECONSTRUCTION;  Surgeon: Crissie Reese, MD;  Location: [place];  Service: Plastics;  Laterality: Bilateral;  bilateral removal of tissue expanders, bilateral placement of implants--Breast  . BREAST SURGERY  06/19/2010   exploration of right mastectomy site due  to post-op bleeding  . INSERTION OF TISSUE EXPANDER AFTER MASTECTOMY  06/19/2010   bilat. mastectomies with bilat. tissue expanders  . PORT-A-CATH REMOVAL  11/26/2010  . PORTACATH PLACEMENT  07/23/2010  . TISSUE EXPANDER REMOVAL  10/20/2010   left    No outpatient medications have been marked as taking for the 01/21/21 encounter (Appointment) with Kong Packett, Harrell Gave, MD.    Allergies: Prochlorperazine, Methadone, Methadone hcl, Other, Propranolol, Tape, and Wound dressing adhesive  Social History   Tobacco Use  . Smoking status: Never Smoker  . Smokeless tobacco: Never Used  Vaping Use  . Vaping Use: Never used  Substance Use Topics  . Alcohol use: No    Comment: occasionally  . Drug use: No    Family History  Problem Relation Age of Onset  . Hypertension Mother   . Depression Mother   . Heart attack Mother   . Hyperlipidemia Mother   . Cancer Sister 30       Ovarian cancer  . Hypertension Maternal Grandmother   . Diabetes Maternal Grandmother   . 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 Hypertension Maternal Grandfather   . Diabetes Paternal Grandmother   . Hypertension Paternal Grandmother   . Kidney disease Paternal Grandmother        Abir Craine stage renal dz - dialysis  . Hyperlipidemia Paternal Grandmother   . Breast cancer Paternal Grandmother   . Alcohol abuse Father   . Depression Father   . Drug abuse Father 15       Died of drug overdose  Review of Systems: A 12-system review of systems was performed and was negative except as noted in the HPI.  --------------------------------------------------------------------------------------------------  Physical Exam: There were no vitals taken for this visit.  General:  NAD. Neck: No JVD or HJR. Lungs: Clear to auscultation bilaterally without wheezes or crackles. Heart: Regular rate and rhythm without murmurs, rubs, or gallops. Abdomen: Soft, nontender, nondistended. Extremities:  No lower extremity edema.  EKG:  ***  Lab Results  Component Value Date   WBC 7.9 01/23/2020   HGB 11.6 01/23/2020   HCT 34.5 01/23/2020   MCV 81 01/23/2020   PLT 291 01/23/2020    Lab Results  Component Value Date   NA 136 01/23/2020   K 3.4 (L) 01/23/2020   CL 96 01/23/2020   CO2 22 01/23/2020   BUN 9 01/23/2020   CREATININE 1.05 (H) 01/23/2020   GLUCOSE 102 (H) 01/23/2020   ALT 15 01/23/2020    Lab Results  Component Value Date   CHOL 203 (H) 09/01/2017   HDL 73 09/01/2017   LDLCALC 112 (H) 09/01/2017   LDLDIRECT 124 (H) 09/01/2017   TRIG 90 09/01/2017   CHOLHDL 4 08/06/2016    --------------------------------------------------------------------------------------------------  ASSESSMENT AND PLAN: Harrell Gave Haide Klinker, MD 01/21/2021 7:32 AM

## 2021-01-22 ENCOUNTER — Encounter: Payer: Self-pay | Admitting: Internal Medicine

## 2021-01-26 ENCOUNTER — Telehealth: Payer: Self-pay | Admitting: Internal Medicine

## 2021-01-26 NOTE — Telephone Encounter (Signed)
-----   Message from Anselm Pancoast, [place] sent at 01/26/2021  3:13 PM EDT ----- Please contact patient regarding a follow up appointment.  The patient is requesting a refill on amlodipine 10 mg.  The patient has no showed multiple times.  Thanks, Ivin Booty

## 2021-01-26 NOTE — Telephone Encounter (Signed)
Attempted to schedule.  LMOV to call office.  ° °

## 2021-01-29 ENCOUNTER — Other Ambulatory Visit: Payer: Self-pay

## 2021-01-29 MED ORDER — AMLODIPINE BESYLATE 10 MG PO TABS: 10.0000 mg | ORAL_TABLET | Freq: Every day | ORAL | 0 refills | Status: DC

## 2021-01-29 NOTE — Telephone Encounter (Signed)
Patient will need to schedule a follow up for further refills.

## 2021-02-04 ENCOUNTER — Encounter: Payer: Self-pay | Admitting: Nurse Practitioner

## 2021-02-04 ENCOUNTER — Ambulatory Visit: Payer: Medicare Other | Admitting: Nurse Practitioner

## 2021-02-04 NOTE — Progress Notes (Deleted)
Office Visit    Patient Name: Penny Kim Date of Encounter: 02/04/2021  Primary Care Provider:  Patient, No Pcp Per (Inactive) Primary Cardiologist:  Penny Bush, MD  Chief Complaint    50 year old female with a history of orthostatic hypotension and syncope, hypertension, right-sided breast cancer status Kim bilateral mastectomies with reconstruction and chemotherapy in January 2012, anxiety, and depression, who presents for follow-up related to hypertension.  Past Medical History    Past Medical History:  Diagnosis Date  . Allergy   . Anxiety   . Breast cancer, right breast ([place]) 02/2010   s/p chemo (completed 09/2010) and right mastectomy w/reconstruction  . Depression   . Family history of breast cancer   . Family history of lung cancer   . Family history of ovarian cancer   . History of stress test    a. treadmill Myoview 11/2016: small defect of mild severity present in the apex location felt to be secondary to breast attenuation. EF 55-65%. Normal study  . HTN (hypertension)   . Orthostatic hypotension   . Psoriasis   . Systolic dysfunction    a. TTE 10/2016: EF 50-55%, no RWMA, normal LV diastolic function, left atrium normal, RV systolic function normal, PASP normal   Past Surgical History:  Procedure Laterality Date  . BREAST RECONSTRUCTION  01/06/2012   Procedure: BREAST RECONSTRUCTION;  Surgeon: Penny Reese, MD;  Location: [place];  Service: Plastics;  Laterality: Bilateral;  bilateral removal of tissue expanders, bilateral placement of implants--Breast  . BREAST SURGERY  06/19/2010   exploration of right mastectomy site due to Kim-op bleeding  . INSERTION OF TISSUE EXPANDER AFTER MASTECTOMY  06/19/2010   bilat. mastectomies with bilat. tissue expanders  . PORT-A-CATH REMOVAL  11/26/2010  . PORTACATH PLACEMENT  07/23/2010  . TISSUE EXPANDER REMOVAL  10/20/2010   left    Allergies  Allergies  Allergen Reactions  . Prochlorperazine  Other (See Comments)    SYNCOPE SYNCOPE  . Methadone Itching  . Methadone Hcl Itching  . Other   . Propranolol     rash  . Tape Rash  . Wound Dressing Adhesive Rash    History of Present Illness    50 year old female with a history of orthostatic hypotension with syncope, hypertension, right-sided breast cancer status Kim bilateral mastectomies with reconstruction and chemotherapy (doxorubicin/cyclophosphamide, paclitaxel) in January 2012, anxiety, depression, and chronic pain.  She was previous evaluated in January 2017 in the setting of syncope.  Echocardiogram was performed in early 2018 showing normal LV function.  Stress testing was completed in March 2018, showing a small defect of mild severity in the apex, felt to be secondary to breast attenuation.  No ischemia was noted.  Low blood pressure medications were down titrated in the setting of syncope and orthostatic lightheadedness in 2017, she subsequently required up-titration due to elevated blood pressures.  [name] was last seen in cardiology clinic in February 2021, at which time she reported elevated blood pressures at home, typically in the 140s.    Amlodipine was titrated to 10 mg daily.  Since her last visit, ***  Home Medications    Prior to Admission medications   Medication Sig Start Date End Date Taking? Authorizing Provider  amLODipine (NORVASC) 10 MG tablet Take 1 tablet (10 mg total) by mouth daily. Please contact our office to schedule a follow up appointment. 01/29/21   Penny Gianotti, NP  clobetasol ointment (TEMOVATE) 2.58 % Apply 1 application topically 2 (  two) times daily. 03/21/20   Penny Kim  clonazePAM (KLONOPIN) 0.5 MG tablet Take 1 tablet (0.5 mg total) by mouth daily as needed. 11/03/20   Penny Alert, MD  cyclobenzaprine (FLEXERIL) 10 MG tablet Take 10 mg by mouth at bedtime. 10/23/19   [provider]  cyclobenzaprine (FLEXERIL) 5 MG tablet Take 5 mg by mouth 3 (three) times  daily as needed. 10/03/20   [provider]  fluticasone (FLONASE) 50 MCG/ACT nasal spray SPRAY 2 SPRAYS INTO EACH NOSTRIL EVERY DAY 11/22/19   Penny Kim Kim, Kim  hydrochlorothiazide (MICROZIDE) 12.5 MG capsule TAKE 1 CAPSULE BY MOUTH EVERY DAY Please schedule office visit for further refills. Second attempt. 01/12/21   Penny Gianotti, NP  meloxicam (MOBIC) 7.5 MG tablet Take 1 tablet (7.5 mg total) by mouth daily. 07/15/20   Penny Kim  prazosin (MINIPRESS) 2 MG capsule Take 1 capsule (2 mg total) by mouth at bedtime. 10/28/20   Penny Alert, MD  tamoxifen (NOLVADEX) 20 MG tablet TAKE 1 TABLET BY MOUTH EVERY DAY 11/10/20   Kim, Penny Dad, MD  vortioxetine HBr (TRINTELLIX) 20 MG TABS tablet Take 1 tablet (20 mg total) by mouth daily. 06/18/20   Penny Alert, MD  zolpidem (AMBIEN) 5 MG tablet Take 1 tablet (5 mg total) by mouth at bedtime as needed for sleep. 10/28/20   Penny Alert, MD    Review of Systems    ***.  All other systems reviewed and are otherwise negative except as noted above.  Physical Exam    VS:  There were no vitals taken for this visit. , BMI There is no height or weight on file to calculate BMI. GEN: Well nourished, well developed, in no acute distress. HEENT: normal. Neck: Supple, no JVD, carotid bruits, or masses. Cardiac: RRR, no murmurs, rubs, or gallops. No clubbing, cyanosis, edema.  Radials/DP/PT 2+ and equal bilaterally.  Respiratory:  Respirations regular and unlabored, clear to auscultation bilaterally. GI: Soft, nontender, nondistended, BS + x 4. MS: no deformity or atrophy. Skin: warm and dry, no rash. Neuro:  Strength and sensation are intact. Psych: Normal affect.  Accessory Clinical Findings    ECG personally reviewed by me today - *** - no acute changes.  Lab Results  Component Value Date   WBC 7.9 01/23/2020   HGB 11.6 01/23/2020   HCT 34.5 01/23/2020   MCV 81 01/23/2020   PLT 291 01/23/2020   Lab  Results  Component Value Date   CREATININE 1.05 (H) 01/23/2020   BUN 9 01/23/2020   NA 136 01/23/2020   K 3.4 (L) 01/23/2020   CL 96 01/23/2020   CO2 22 01/23/2020   Lab Results  Component Value Date   ALT 15 01/23/2020   AST 24 01/23/2020   ALKPHOS 48 01/23/2020   BILITOT 0.3 01/23/2020   Lab Results  Component Value Date   CHOL 203 (H) 09/01/2017   HDL 73 09/01/2017   LDLCALC 112 (H) 09/01/2017   LDLDIRECT 124 (H) 09/01/2017   TRIG 90 09/01/2017   CHOLHDL 4 08/06/2016    Lab Results  Component Value Date   HGBA1C 5.9 (H) 01/23/2020    Assessment & Plan    1.  ***   Murray Hodgkins, NP 02/04/2021, 8:04 AM

## 2021-02-06 ENCOUNTER — Ambulatory Visit (INDEPENDENT_AMBULATORY_CARE_PROVIDER_SITE_OTHER): Payer: Medicare Other | Admitting: Nurse Practitioner

## 2021-02-06 ENCOUNTER — Encounter: Payer: Self-pay | Admitting: Nurse Practitioner

## 2021-02-06 ENCOUNTER — Other Ambulatory Visit: Payer: Self-pay

## 2021-02-06 VITALS — BP 96/68 | HR 68 | Ht 64.5 in | Wt 131.0 lb

## 2021-02-06 DIAGNOSIS — E782 Mixed hyperlipidemia: Secondary | ICD-10-CM

## 2021-02-06 DIAGNOSIS — I10 Essential (primary) hypertension: Secondary | ICD-10-CM

## 2021-02-06 MED ORDER — AMLODIPINE BESYLATE 10 MG PO TABS: 10.0000 mg | ORAL_TABLET | Freq: Every day | ORAL | 2 refills | Status: DC

## 2021-02-06 MED ORDER — HYDROCHLOROTHIAZIDE 12.5 MG PO CAPS: ORAL_CAPSULE | ORAL | 2 refills | Status: DC

## 2021-02-06 NOTE — Progress Notes (Signed)
Office Visit    Patient Name: Penny Kim Date of Encounter: 02/06/2021  Primary Care Provider:  Patient, No Pcp Per (Inactive) Primary Cardiologist:  Penny Bush, MD  Chief Complaint    50 year old female with a history of orthostatic hypotension and syncope, hypertension, right-sided breast cancer status post bilateral mastectomies with reconstruction and chemotherapy in January 2012, anxiety, and depression, who presents for follow-up related to hypertension.  Past Medical History    Past Medical History:  Diagnosis Date  . Allergy   . Anxiety   . Breast cancer, right breast ([place]) 02/2010   s/p chemo (completed 09/2010) and right mastectomy w/reconstruction  . Depression   . Family history of breast cancer   . Family history of lung cancer   . Family history of ovarian cancer   . History of echocardiogram    a. TTE 10/2016: EF 50-55%, no RWMA, normal LV diastolic function, left atrium normal, RV systolic function normal, PASP normal  . History of stress test    a. treadmill Myoview 11/2016: small defect of mild severity present in the apex location felt to be secondary to breast attenuation. EF 55-65%. Normal study  . HTN (hypertension)   . Orthostatic hypotension   . Psoriasis    Past Surgical History:  Procedure Laterality Date  . BREAST RECONSTRUCTION  01/06/2012   Procedure: BREAST RECONSTRUCTION;  Surgeon: Crissie Reese, MD;  Location: [facility] [place];  Service: Plastics;  Laterality: Bilateral;  bilateral removal of tissue expanders, bilateral placement of implants--Breast  . BREAST SURGERY  06/19/2010   exploration of right mastectomy site due to post-op bleeding  . INSERTION OF TISSUE EXPANDER AFTER MASTECTOMY  06/19/2010   bilat. mastectomies with bilat. tissue expanders  . PORT-A-CATH REMOVAL  11/26/2010  . PORTACATH PLACEMENT  07/23/2010  . TISSUE EXPANDER REMOVAL  10/20/2010   left    Allergies  Allergies  Allergen Reactions  .  Prochlorperazine Other (See Comments)    SYNCOPE SYNCOPE  . Methadone Itching  . Methadone Hcl Itching  . Other   . Propranolol     rash  . Tape Rash  . Wound Dressing Adhesive Rash    History of Present Illness    50 year old female with a history of orthostatic hypotension with syncope, hypertension, right-sided breast cancer status post bilateral mastectomies with reconstruction and chemotherapy (doxorubicin/cyclophosphamide, paclitaxel) in January 2012, anxiety, depression, and chronic pain.  She was previous evaluated in January 2017 in the setting of syncope.  Echocardiogram was performed in early 2018 showing normal LV function.  Stress testing was completed in March 2018, showing a small defect of mild severity in the apex, felt to be secondary to breast attenuation.  No ischemia was noted.  Blood pressure medications were down titrated in the setting of syncope and orthostatic lightheadedness in 2017, she subsequently required up-titration due to elevated blood pressures.  [name] was last seen in cardiology clinic in February 2021, at which time she reported elevated blood pressures at home, typically in the 140s.  Amlodipine was titrated to 10 mg daily.  Since her last visit, she has mostly done well.  She recently had some dizziness and fatigue but this is since resolved.  Her blood pressures trend in the 130s at home.  She denies chest pain, dyspnea, palpitations, PND, orthopnea, dizziness, syncope, edema, or early satiety.  She has run out of amlodipine and is due for refill.  She only has 5 days left of HCTZ.  She has  not had any labs since last May.  Home Medications    Prior to Admission medications   Medication Sig Start Date End Date Taking? Authorizing Provider  clobetasol ointment (TEMOVATE) 7.16 % Apply 1 application topically 2 (two) times daily. 03/21/20  Yes Edrick Kins, DPM  clonazePAM (KLONOPIN) 0.5 MG tablet Take 1 tablet (0.5 mg total) by mouth daily as needed.  11/03/20  Yes Ursula Alert, MD  cyclobenzaprine (FLEXERIL) 10 MG tablet Take 10 mg by mouth at bedtime. 10/23/19  Yes [provider]  cyclobenzaprine (FLEXERIL) 5 MG tablet Take 5 mg by mouth 3 (three) times daily as needed. 10/03/20  Yes [provider]  fluticasone (FLONASE) 50 MCG/ACT nasal spray SPRAY 2 SPRAYS INTO EACH NOSTRIL EVERY DAY 11/22/19  Yes Carles Collet M, PA-C  gabapentin (NEURONTIN) 300 MG capsule Take 2 capsules by mouth 2 (two) times daily. 01/31/21  Yes [provider]  meloxicam (MOBIC) 7.5 MG tablet Take 1 tablet (7.5 mg total) by mouth daily. 07/15/20  Yes Trinna Post, PA-C  tamoxifen (NOLVADEX) 20 MG tablet TAKE 1 TABLET BY MOUTH EVERY DAY 11/10/20  Yes Magrinat, Virgie Dad, MD  vortioxetine HBr (TRINTELLIX) 20 MG TABS tablet Take 1 tablet (20 mg total) by mouth daily. 06/18/20  Yes Ursula Alert, MD  zolpidem (AMBIEN) 5 MG tablet Take 1 tablet (5 mg total) by mouth at bedtime as needed for sleep. 10/28/20  Yes Ursula Alert, MD  amLODipine (NORVASC) 10 MG tablet Take 1 tablet (10 mg total) by mouth daily. 02/06/21   Theora Gianotti, NP  hydrochlorothiazide (MICROZIDE) 12.5 MG capsule TAKE 1 CAPSULE BY MOUTH EVERY DAY Please schedule office visit for further refills. Second attempt. 02/06/21   Theora Gianotti, NP    Review of Systems    Recent bout of malaise, fatigue, dizziness which is since resolved.  She denies chest pain, palpitations, dyspnea, pnd, orthopnea, n, v, dizziness, syncope, edema, weight gain, or early satiety.  All other systems reviewed and are otherwise negative except as noted above.  Physical Exam    VS:  BP 96/68   Pulse 68   Ht 5' 4.5" (1.638 m)   Wt 131 lb (59.4 kg)   BMI 22.14 kg/m  , BMI Body mass index is 22.14 kg/m. GEN: Well nourished, well developed, in no acute distress. HEENT: normal. Neck: Supple, no JVD, carotid bruits, or masses. Cardiac: RRR, no murmurs, rubs, or gallops. No  clubbing, cyanosis, edema.  Radials/PT 2+ and equal bilaterally.  Respiratory:  Respirations regular and unlabored, clear to auscultation bilaterally. GI: Soft, nontender, nondistended, BS + x 4. MS: no deformity or atrophy. Skin: warm and dry, no rash. Neuro:  Strength and sensation are intact. Psych: Normal affect.  Accessory Clinical Findings    ECG personally reviewed by me today -regular sinus rhythm, 68 - no acute changes.  Lab Results  Component Value Date   WBC 7.9 01/23/2020   HGB 11.6 01/23/2020   HCT 34.5 01/23/2020   MCV 81 01/23/2020   PLT 291 01/23/2020   Lab Results  Component Value Date   CREATININE 1.05 (H) 01/23/2020   BUN 9 01/23/2020   NA 136 01/23/2020   K 3.4 (L) 01/23/2020   CL 96 01/23/2020   CO2 22 01/23/2020   Lab Results  Component Value Date   ALT 15 01/23/2020   AST 24 01/23/2020   ALKPHOS 48 01/23/2020   BILITOT 0.3 01/23/2020   Lab Results  Component Value Date  CHOL 203 (H) 09/01/2017   HDL 73 09/01/2017   LDLCALC 112 (H) 09/01/2017   LDLDIRECT 124 (H) 09/01/2017   TRIG 90 09/01/2017   CHOLHDL 4 08/06/2016    Lab Results  Component Value Date   HGBA1C 5.9 (H) 01/23/2020    Assessment & Plan    1.  Essential hypertension/history of orthostatic hypotension: She has done well over the past year with pressures typically in the 130s when she checks, though she does not check frequently.  She has been out of her amlodipine for a few days and is about to run out of HCTZ.  I will follow-up lab work today and refill.  Of note, blood pressure is 96/68 today and 100/70 on repeat.  Advised her to follow her blood pressure daily over the next few weeks as we may need to come back on her amlodipine dose if pressures are typically soft.  She has not had any symptoms of orthostasis recently.  2.  Anxiety: Followed by psychiatry.  3.  Lipid status: Not checked in several years.  She does not have primary care and would like to have her lipids  checked.  No follow-up today.    4.  Disposition: Follow-up CBC, complete metabolic panel, and fasting lipids.  We have provided her with a phone number for: Primary care.  Follow-up in clinic in 1 year or sooner if necessary.  Murray Hodgkins, NP 02/06/2021, 11:21 AM

## 2021-02-06 NOTE — Patient Instructions (Signed)
Medication Instructions:  Your physician recommends that you continue on your current medications as directed. Please refer to the Current Medication list given to you today.  Amlodipine and Hctz has been refilled today.  *If you need a refill on your cardiac medications before your next appointment, please call your pharmacy*   Lab Work: Lipid, Cmp, Cbc today If you have labs (blood work) drawn today and your tests are completely normal, you will receive your results only by: Marland Kitchen MyChart Message (if you have MyChart) OR . A paper copy in the mail If you have any lab test that is abnormal or we need to change your treatment, we will call you to review the results.   Testing/Procedures: None ordered   Follow-Up: At [facility], you and your health needs are our priority.  As part of our continuing mission to provide you with exceptional heart care, we have created designated Provider Care Teams.  These Care Teams include your primary Cardiologist (physician) and Advanced Practice Providers (APPs -  Physician Assistants and Nurse Practitioners) who all work together to provide you with the care you need, when you need it.  We recommend signing up for the patient portal called "MyChart".  Sign up information is provided on this After Visit Summary.  MyChart is used to connect with patients for Virtual Visits (Telemedicine).  Patients are able to view lab/test results, encounter notes, upcoming appointments, etc.  Non-urgent messages can be sent to your provider as well.   To learn more about what you can do with MyChart, go to [email or web address].    Your next appointment:   Your physician wants you to follow-up in:1 year You will receive a reminder letter in the mail two months in advance. If you don't receive a letter, please call our office to schedule the follow-up appointment.   The format for your next appointment:   In Person  Provider:   You may see Nelva Bush, MD or  one of the following Advanced Practice Providers on your designated Care Team:    Murray Hodgkins, NP  Christell Faith, PA-C  Marrianne Mood, PA-C  Cadence Kathlen Mody, [place]  Laurann Montana, NP    Other Instructions Please call [place] [phone]. They will help you find a local primary care provider.

## 2021-02-07 LAB — COMPREHENSIVE METABOLIC PANEL
ALT: 9 IU/L (ref 0–32)
AST: 16 IU/L (ref 0–40)
Albumin/Globulin Ratio: 1.3 (ref 1.2–2.2)
Albumin: 4 g/dL (ref 3.8–4.8)
Alkaline Phosphatase: 51 IU/L (ref 44–121)
BUN/Creatinine Ratio: 12 (ref 9–23)
BUN: 10 mg/dL (ref 6–24)
Bilirubin Total: <0.2 mg/dL (ref 0.0–1.2)
CO2: 28 mmol/L (ref 20–29)
Calcium: 9.6 mg/dL (ref 8.7–10.2)
Chloride: 102 mmol/L (ref 96–106)
Creatinine, Ser: 0.83 mg/dL (ref 0.57–1.00)
Globulin, Total: 3.2 g/dL (ref 1.5–4.5)
Glucose: 102 mg/dL — ABNORMAL HIGH (ref 65–99)
Potassium: 5.3 mmol/L — ABNORMAL HIGH (ref 3.5–5.2)
Sodium: 142 mmol/L (ref 134–144)
Total Protein: 7.2 g/dL (ref 6.0–8.5)
eGFR: 86 mL/min/{1.73_m2} (ref >59)

## 2021-02-07 LAB — CBC WITH DIFFERENTIAL/PLATELET
Basophils Absolute: 0.1 10*3/uL (ref 0.0–0.2)
Basos: 2 %
EOS (ABSOLUTE): 0.4 10*3/uL (ref 0.0–0.4)
Eos: 8 %
Hematocrit: 35.7 % (ref 34.0–46.6)
Hemoglobin: 11.7 g/dL (ref 11.1–15.9)
Immature Grans (Abs): 0.1 10*3/uL (ref 0.0–0.1)
Immature Granulocytes: 1 %
Lymphocytes Absolute: 1.4 10*3/uL (ref 0.7–3.1)
Lymphs: 32 %
MCH: 26.3 pg — ABNORMAL LOW (ref 26.6–33.0)
MCHC: 32.8 g/dL (ref 31.5–35.7)
MCV: 80 fL (ref 79–97)
Monocytes Absolute: 0.3 10*3/uL (ref 0.1–0.9)
Monocytes: 7 %
Neutrophils Absolute: 2.1 10*3/uL (ref 1.4–7.0)
Neutrophils: 50 %
Platelets: 323 10*3/uL (ref 150–450)
RBC: 4.45 x10E6/uL (ref 3.77–5.28)
RDW: 13.8 % (ref 11.7–15.4)
WBC: 4.3 10*3/uL (ref 3.4–10.8)

## 2021-02-07 LAB — LIPID PANEL
Chol/HDL Ratio: 3.4 ratio (ref 0.0–4.4)
Cholesterol, Total: 213 mg/dL — ABNORMAL HIGH (ref 100–199)
HDL: 62 mg/dL (ref >39)
LDL Chol Calc (NIH): 120 mg/dL — ABNORMAL HIGH (ref 0–99)
Triglycerides: 178 mg/dL — ABNORMAL HIGH (ref 0–149)
VLDL Cholesterol Cal: 31 mg/dL (ref 5–40)

## 2021-02-07 NOTE — Progress Notes (Incomplete)
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   She continues on tamoxifen with good tolerance.  Since her last visit, she has experienced multiple falls. Head CT performed on 11/23/2019 was negative.  She also underwent lumbar spine MRI on 03/28/2020 for back pain. This showed: L4-5 left foraminal extrusion impinging on L4 nerve root, likely the symptomatic finding; L2-3 and L3-4 disc degeneration without impingement.   REVIEW OF SYSTEMS: Penny Kim    COVID 19 VACCINATION STATUS:    HISTORY OF CURRENT ILLNESS: From the original intake note:  Penny Kim had noticed that her right breast dropped a little differently than the left, and occasionally she would have a strange sensation radiating to the right nipple, but she really did not pay much attention to this. She had her first mammogram ever, a screening mammogram at [facility], 04/23/2010, and this showed diffuse calcifications in the lower inner quadrant of the right breast measuring up to 5.6 cm. She was recalled 08/08, and Dr. March Rummage found the breast to be dense, which limits mammographic sensitivity, but again was able to demonstrate these calcifications throughout the lower inner quadrant of the right breast. Biopsy was discussed, and performed 08/09, and this showed [phone])  ductal carcinoma in situ, high grade, which was estrogen receptor 100% and progesterone receptor 57% positive.  Bilateral breast MRIs were obtained 08/14. This showed in the right breast the area of enhancement to total 7.7 cm anterior posteriorly. Most of this was nodular and non-mass like, but in addition in the anterior aspect of this, there was a rounded area of mass-like enhancement measuring 1.7 cm. This portion was suspicious for invasive ductal carcinoma. The left breast was unremarkable, and there were no suspicious internal mammary or axillary lymph nodes noted.  Accordingly the patient was brought back for biopsy of the more mass-like area on 05/05/2010, and this biopsy ([id number]) showed a grade 2 invasive ductal carcinoma, which was estrogen receptor at 78% and progesterone receptor 82% positive. The proliferation marker was elevated at 85%. 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  Her subsequent history is as detailed below.   PAST MEDICAL HISTORY: Past Medical History:  Diagnosis Date  . Allergy   . Anxiety   . Breast cancer, right breast ([place]) 02/2010   s/p chemo (completed 09/2010) and right mastectomy w/reconstruction  . Depression   . Family history of breast cancer   . Family history of lung cancer   . Family history of ovarian cancer   . History of echocardiogram    a. TTE 10/2016: EF 50-55%, no RWMA, normal LV diastolic function, left atrium normal, RV systolic function normal, PASP normal  . History of stress test    a. treadmill Myoview 11/2016: small defect of mild severity present in the apex location felt to be secondary to breast attenuation. EF 55-65%. Normal study  . HTN (hypertension)   . Orthostatic hypotension   .  Psoriasis     PAST SURGICAL HISTORY: Past Surgical History:  Procedure Laterality Date  . BREAST RECONSTRUCTION  01/06/2012   Procedure: BREAST RECONSTRUCTION;  Surgeon: Crissie Reese, MD;  Location: [facility];  Service: Plastics;  Laterality: Bilateral;  bilateral removal of tissue expanders, bilateral placement of implants--Breast  . BREAST SURGERY  06/19/2010   exploration of right mastectomy site due to post-op bleeding  . INSERTION OF TISSUE EXPANDER AFTER MASTECTOMY  06/19/2010   bilat. mastectomies with bilat. tissue expanders  . PORT-A-CATH REMOVAL  11/26/2010  . PORTACATH PLACEMENT  07/23/2010  . TISSUE EXPANDER REMOVAL  10/20/2010   left    FAMILY HISTORY: Family History  Problem Relation Age of Onset  . Hypertension Mother   . Depression Mother   . Heart attack Mother   . Hyperlipidemia Mother   . Cancer Sister 30       Ovarian cancer  . Hypertension Maternal Grandmother   . Diabetes Maternal Grandmother   . 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 Hypertension Maternal Grandfather   . Diabetes Paternal Grandmother   . Hypertension Paternal Grandmother   . Kidney disease Paternal Grandmother        End stage renal dz - dialysis  . Hyperlipidemia Paternal Grandmother   . Breast cancer Paternal Grandmother   . Alcohol abuse Father   . Depression Father   . Drug abuse Father 30       Died of drug overdose  The patient's father died at the age of 57 from drug overdose in the setting of ETOH abuse. The patient's mother died in December 11, 2015 at age 18. She has a significant history of depression. The patient has one sister, also with a history of depression, who was diagnosed with ovarian cancer at age 6.   GYNECOLOGIC HISTORY:  No LMP recorded. (Menstrual status: Other). GX P 0 LMP irregular following chemotherapy (since 2013-12-10) Hysterectomy? no BSO? no   SOCIAL HISTORY: (updated 06/2019)  [name] is currently on disability.When asked who she would call in case of a problem, she really does not have anyone that she would call. She does not feel her family is supportive, and she has no close friends.     ADVANCED DIRECTIVES:  not in place   HEALTH MAINTENANCE: Social History   Tobacco Use  . Smoking status: Never Smoker  . Smokeless tobacco: Never Used  Vaping Use  . Vaping Use: Never used  Substance Use Topics  . Alcohol use: No    Comment: occasionally  . Drug use: No     Colonoscopy: n/a  PAP: 11/2017, negative  Bone density: n/a   Allergies  Allergen Reactions  . Prochlorperazine Other (See Comments)    SYNCOPE SYNCOPE  . Methadone Itching  . Methadone Hcl Itching  . Other   . Propranolol     rash  . Tape Rash  . Wound Dressing Adhesive Rash    Current Outpatient Medications  Medication Sig Dispense Refill  . amLODipine (NORVASC) 10 MG tablet Take 1 tablet (10 mg total) by mouth daily. 30 tablet 2  . clobetasol ointment (TEMOVATE) 0.09 % Apply 1 application topically 2 (two) times daily. 60 g 3  . clonazePAM (KLONOPIN) 0.5 MG tablet Take 1 tablet (0.5 mg total) by mouth daily as needed. 30 tablet 0  . cyclobenzaprine (FLEXERIL) 10 MG tablet Take 10 mg by mouth at bedtime.    Marland Kitchen  cyclobenzaprine (FLEXERIL) 5 MG tablet Take 5 mg by mouth 3 (three) times daily as needed.    . fluticasone (FLONASE) 50 MCG/ACT nasal spray SPRAY 2 SPRAYS INTO EACH NOSTRIL EVERY DAY 48 mL 0  . gabapentin (NEURONTIN) 300 MG capsule Take 2 capsules by mouth 2 (two) times daily.    . hydrochlorothiazide (MICROZIDE) 12.5 MG capsule TAKE 1 CAPSULE BY MOUTH EVERY DAY Please schedule office visit for further refills. Second attempt. 30 capsule 2  . meloxicam (MOBIC) 7.5 MG tablet Take 1 tablet (7.5 mg total) by mouth daily. 30 tablet 0  . tamoxifen (NOLVADEX) 20 MG tablet TAKE 1 TABLET BY MOUTH EVERY DAY 90 tablet 4  . vortioxetine HBr (TRINTELLIX) 20 MG TABS tablet Take 1 tablet (20 mg total) by mouth daily. 90 tablet 1  . zolpidem (AMBIEN) 5 MG tablet Take 1 tablet (5 mg total) by mouth at bedtime as needed for sleep. 30 tablet 0   No current facility-administered medications for this visit.    OBJECTIVE:    There were no vitals filed for this visit.   There is no height or weight on file to calculate BMI.   Wt Readings from Last 3 Encounters:  02/06/21 131 lb (59.4 kg)  07/15/20 138 lb (62.6 kg)  01/23/20 146 lb (66.2 kg)     ECOG FS:  Sclerae unicteric, EOMs intact Wearing a mask No cervical or supraclavicular adenopathy Lungs no rales or rhonchi Heart regular rate and rhythm Abd soft, nontender, positive bowel sounds MSK no focal spinal tenderness, no upper extremity lymphedema Neuro: nonfocal, well oriented, appropriate affect Breasts:    LAB RESULTS:  CMP     Component Value Date/Time   NA 142 02/06/2021 1128   NA 138 10/13/2015 1406   K 5.3 (H) 02/06/2021 1128   K 3.6 10/13/2015 1406   CL 102 02/06/2021 1128   CL 105 01/30/2013 1313   CO2 28 02/06/2021 1128   CO2 27 10/13/2015 1406   GLUCOSE 102 (H) 02/06/2021 1128   GLUCOSE 115 (H) 11/23/2019 1229   GLUCOSE 114 10/13/2015 1406   GLUCOSE 94 01/30/2013 1313   BUN 10 02/06/2021 1128   BUN 10.2 10/13/2015 1406   CREATININE 0.83 02/06/2021 1128   CREATININE 1.0 10/13/2015 1406   CALCIUM 9.6 02/06/2021 1128   CALCIUM 9.5 10/13/2015 1406   PROT 7.2 02/06/2021 1128   PROT 7.4 10/13/2015 1406   ALBUMIN 4.0 02/06/2021 1128   ALBUMIN 4.0 10/13/2015 1406   AST 16 02/06/2021 1128   AST 22 10/13/2015 1406   ALT 9 02/06/2021 1128   ALT 15 10/13/2015 1406   ALKPHOS 51 02/06/2021 1128   ALKPHOS 38 (L) 10/13/2015 1406   BILITOT <0.2 02/06/2021 1128   BILITOT <0.30 10/13/2015 1406   GFRNONAA 63 01/23/2020 1419   GFRAA 73 01/23/2020 1419    Lab Results  Component Value Date   WBC 4.3 02/06/2021   NEUTROABS 2.1 02/06/2021   HGB 11.7 02/06/2021   HCT 35.7 02/06/2021   MCV 80 02/06/2021   PLT 323 02/06/2021   Lab Results  Component Value Date   LABCA2 11 09/25/2012    No components found for: [id number]  No results for input(s): INR in the last 168 hours.  Lab Results  Component Value Date   LABCA2 11  09/25/2012    No results found for: OMB559  No results found for: RCB638  No results found for: GTX646  No results found for: CA2729  No components found  for: HGQUANT  No results found for: CEA1 / No results found for: CEA1   No results found for: AFPTUMOR  No results found for: CHROMOGRNA  No results found for: TOTALPROTELP, ALBUMINELP, A1GS, A2GS, BETS, BETA2SER, GAMS, MSPIKE, SPEI (this displays SPEP labs)  No results found for: KPAFRELGTCHN, LAMBDASER, KAPLAMBRATIO (kappa/lambda light chains)  No results found for: HGBA, HGBA2QUANT, HGBFQUANT, HGBSQUAN (Hemoglobinopathy evaluation)   No results found for: LDH  No results found for: IRON, TIBC, IRONPCTSAT (Iron and TIBC)  No results found for: FERRITIN  Urinalysis    Component Value Date/Time   COLORURINE YELLOW (A) 11/17/2019 2054   APPEARANCEUR CLEAR (A) 11/17/2019 2054   LABSPEC 1.014 11/17/2019 2054   LABSPEC 1.005 08/07/2013 1155   PHURINE 6.0 11/17/2019 2054   GLUCOSEU NEGATIVE 11/17/2019 2054   GLUCOSEU Negative 08/07/2013 [place] 11/17/2019 2054   BILIRUBINUR NEGATIVE 11/17/2019 2054   BILIRUBINUR Negative 08/07/2013 [place] 11/17/2019 2054   PROTEINUR NEGATIVE 11/17/2019 2054   UROBILINOGEN 0.2 08/07/2013 1155   NITRITE NEGATIVE 11/17/2019 2054   [place] 11/17/2019 2054   LEUKOCYTESUR Negative 08/07/2013 1155    STUDIES: No results found.   ASSESSMENT: 50 y.o. 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   (3) She did not require  radiation.   (4) She started tamoxifenin March 2012          (a) stopped tamoxifen January 2018  (b) resumed tamoxifen January 2019  (5) genetic testing on 06/27/2018  (a) Previous BRCA2 VUS was reclassified to favor polymorphism on 05/06/2014.  The remainder of the BRCA1 and BRCA2 genes were negative through [facility].  No BART.   (b)Updated genetic testing 07/05/2018-through the [facility] gene panel offered by [employer] found no deleterious mutations in APC, ATM, BARD1, BMPR1A, BRCA1, BRCA2, BRIP1, CHD1, CDK4, CDKN2A, CHEK2, EPCAM (large rearrangement only), GREM1, HOXB13, AXIN2, MSH3, NTHL1, RNF43, GALNT12, RSP20, MLH1, MSH2, MSH6, MUTYH, NBN, PALB2, PMS2, PTEN, RAD51C, RAD51D, SMAD4, STK11, and TP53. Sequencing was performed for select regions of POLE and POLD1, and large rearrangement analysis was performed for select regions of GREM1.   PLAN: Kaiya did not show for her 09/01/2020 appointment.  A follow-up letter has been mailed.   Aurea Graff   02/07/2021 10:34 PM Medical Oncology and Hematology [facility] [place], [place] [address] Tel. [phone]    Fax. [phone]   I, Wilburn Mylar, am acting as scribe for Dr. Virgie Dad. Magrinat.  I, Lurline Del MD, have reviewed the above documentation for accuracy and completeness, and I agree with the above.   *Total Encounter Time as defined by the Centers for Medicare and Medicaid Services includes, in addition to the face-to-face time of a patient visit (documented in the note above) non-face-to-face time: obtaining and reviewing outside history, ordering and reviewing medications, tests or procedures, care coordination (communications with other health care professionals or caregivers) and documentation in the medical record.

## 2021-02-09 ENCOUNTER — Inpatient Hospital Stay: Payer: Medicare Other

## 2021-02-09 ENCOUNTER — Inpatient Hospital Stay: Payer: Medicare Other | Admitting: Oncology

## 2021-02-09 ENCOUNTER — Telehealth: Payer: Self-pay | Admitting: *Deleted

## 2021-02-09 DIAGNOSIS — E875 Hyperkalemia: Secondary | ICD-10-CM

## 2021-02-09 NOTE — Telephone Encounter (Signed)
Patient is returning your call.  

## 2021-02-09 NOTE — Telephone Encounter (Signed)
-----   Message from Theora Gianotti, NP sent at 02/09/2021  8:30 AM EDT ----- Blood counts, kidney function, liver enzymes ok.  Potassium is mildly elevated - this may simply be a lab/collection error, but we should follow up again this week to make sure that it's not really running high.  Cholesterol numbers are elevated - LDL is 120 - ideally this would be <100.  Her HDL cholesterol is great, however, and her overall risk of experiencing a cardiac event in the next 10 yrs, considering these cholesterol numbers, is 2.7%.  In that setting, I would not advise starting a cholesterol medicine, but instead, really emphasize efforts geared at healthy living - regular exercise and low cholesterol diet (lots of veggies).

## 2021-02-09 NOTE — Telephone Encounter (Signed)
Spoke with patient and reviewed results and recommendations. She states that they are going to do labs over at the cancer center this Wednesday and to send her My Chart message with the information for them to fax results over to [place]. She was appreciative for the call with no further questions at this time.

## 2021-02-09 NOTE — Telephone Encounter (Signed)
Left voicemail message to call back for review of results and recommendations.  
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
## 2021-02-11 ENCOUNTER — Other Ambulatory Visit: Payer: Self-pay

## 2021-02-11 ENCOUNTER — Inpatient Hospital Stay (HOSPITAL_BASED_OUTPATIENT_CLINIC_OR_DEPARTMENT_OTHER): Payer: Medicare Other | Admitting: Oncology

## 2021-02-11 ENCOUNTER — Inpatient Hospital Stay: Payer: Medicare Other | Attending: Oncology

## 2021-02-11 VITALS — BP 111/67 | HR 63 | Temp 97.0°F | Resp 18 | Ht 64.5 in | Wt 132.7 lb

## 2021-02-11 DIAGNOSIS — C50211 Malignant neoplasm of upper-inner quadrant of right female breast: Secondary | ICD-10-CM

## 2021-02-11 DIAGNOSIS — Z170 Estrogen receptor positive status [ER+]: Secondary | ICD-10-CM | POA: Diagnosis not present

## 2021-02-11 DIAGNOSIS — C50919 Malignant neoplasm of unspecified site of unspecified female breast: Secondary | ICD-10-CM | POA: Insufficient documentation

## 2021-02-11 DIAGNOSIS — F4000 Agoraphobia, unspecified: Secondary | ICD-10-CM | POA: Insufficient documentation

## 2021-02-11 DIAGNOSIS — F419 Anxiety disorder, unspecified: Secondary | ICD-10-CM | POA: Diagnosis not present

## 2021-02-11 DIAGNOSIS — Z9221 Personal history of antineoplastic chemotherapy: Secondary | ICD-10-CM | POA: Insufficient documentation

## 2021-02-11 DIAGNOSIS — F32A Depression, unspecified: Secondary | ICD-10-CM | POA: Diagnosis not present

## 2021-02-11 DIAGNOSIS — Z79810 Long term (current) use of selective estrogen receptor modulators (SERMs): Secondary | ICD-10-CM | POA: Diagnosis not present

## 2021-02-11 LAB — CBC WITH DIFFERENTIAL/PLATELET
Abs Immature Granulocytes: 0 10*3/uL (ref 0.00–0.07)
Basophils Absolute: 0.1 10*3/uL (ref 0.0–0.1)
Basophils Relative: 1 %
Eosinophils Absolute: 0.2 10*3/uL (ref 0.0–0.5)
Eosinophils Relative: 6 %
HCT: 34.5 % — ABNORMAL LOW (ref 36.0–46.0)
Hemoglobin: 10.8 g/dL — ABNORMAL LOW (ref 12.0–15.0)
Immature Granulocytes: 0 %
Lymphocytes Relative: 40 %
Lymphs Abs: 1.5 10*3/uL (ref 0.7–4.0)
MCH: 26.3 pg (ref 26.0–34.0)
MCHC: 31.3 g/dL (ref 30.0–36.0)
MCV: 84.1 fL (ref 80.0–100.0)
Monocytes Absolute: 0.3 10*3/uL (ref 0.1–1.0)
Monocytes Relative: 7 %
Neutro Abs: 1.6 10*3/uL — ABNORMAL LOW (ref 1.7–7.7)
Neutrophils Relative %: 46 %
Platelets: 295 10*3/uL (ref 150–400)
RBC: 4.1 MIL/uL (ref 3.87–5.11)
RDW: 14.2 % (ref 11.5–15.5)
WBC: 3.6 10*3/uL — ABNORMAL LOW (ref 4.0–10.5)
nRBC: 0 % (ref 0.0–0.2)

## 2021-02-11 MED ORDER — DIPHENHYDRAMINE HCL 25 MG PO CAPS
ORAL_CAPSULE | ORAL | Status: AC
  Filled 2021-02-11: qty 1

## 2021-02-11 MED ORDER — ACETAMINOPHEN 325 MG PO TABS
ORAL_TABLET | ORAL | Status: AC
  Filled 2021-02-11: qty 2

## 2021-02-11 NOTE — Telephone Encounter (Signed)
Attempted to schedule.  LMOV to call office.  ° °

## 2021-02-11 NOTE — Telephone Encounter (Signed)
Patient states she is at the cancer center having blood drawn and ask if they can draw the labs that we need. Please advise.

## 2021-02-12 NOTE — Telephone Encounter (Signed)
Left voicemail message to call back  

## 2021-02-13 NOTE — Telephone Encounter (Signed)
Spoke with patient and reviewed that cancer center did not draw labs we needed. She did try to call that day but they did not reach anyone. Advised that she can go to [facility] and have labs done there with no appointment. She verbalized understanding and will head that way to have them done. She had no further questions at this time.

## 2021-02-13 NOTE — Addendum Note (Signed)
Addended by: Valora Corporal on: 02/13/2021 02:58 PM   Modules accepted: Orders

## 2021-02-19 ENCOUNTER — Other Ambulatory Visit: Payer: Self-pay | Admitting: *Deleted

## 2021-02-19 ENCOUNTER — Other Ambulatory Visit
Admission: RE | Admit: 2021-02-19 | Discharge: 2021-02-19 | Disposition: A | Discharge status: Home / Self Care | Payer: Medicare Other | Attending: Nurse Practitioner | Admitting: Nurse Practitioner

## 2021-02-19 DIAGNOSIS — E875 Hyperkalemia: Secondary | ICD-10-CM | POA: Insufficient documentation

## 2021-02-19 LAB — COMPREHENSIVE METABOLIC PANEL
ALT: 13 U/L (ref 0–44)
AST: 20 U/L (ref 15–41)
Albumin: 4 g/dL (ref 3.5–5.0)
Alkaline Phosphatase: 45 U/L (ref 38–126)
Anion gap: 11 (ref 5–15)
BUN: 11 mg/dL (ref 6–20)
CO2: 23 mmol/L (ref 22–32)
Calcium: 9.5 mg/dL (ref 8.9–10.3)
Chloride: 103 mmol/L (ref 98–111)
Creatinine, Ser: 1.25 mg/dL — ABNORMAL HIGH (ref 0.44–1.00)
GFR, Estimated: 53 mL/min — ABNORMAL LOW (ref >60)
Glucose, Bld: 114 mg/dL — ABNORMAL HIGH (ref 70–99)
Potassium: 3.7 mmol/L (ref 3.5–5.1)
Sodium: 137 mmol/L (ref 135–145)
Total Bilirubin: 0.3 mg/dL (ref 0.3–1.2)
Total Protein: 8.2 g/dL — ABNORMAL HIGH (ref 6.5–8.1)

## 2021-02-19 NOTE — Telephone Encounter (Signed)
Patient calling back stating that she had repeat labs have been done. Advised that once we get those results I would reach out to her with that information. She verbalized understanding with no further questions at this time.

## 2021-02-19 NOTE — Telephone Encounter (Signed)
Spoke with patient to inquire where she wants to have her labs done. Advised that orders are in the system for her to have done here at [facility] and no appointment is needed for this. She states that she will go today to have them done and then call back to let us know if she once she has them done. She was agreeable with plan and had no further questions at this time.

## 2021-02-20 ENCOUNTER — Other Ambulatory Visit: Payer: Self-pay | Admitting: Psychiatry

## 2021-02-20 DIAGNOSIS — Z8659 Personal history of other mental and behavioral disorders: Secondary | ICD-10-CM

## 2021-02-20 DIAGNOSIS — F4010 Social phobia, unspecified: Secondary | ICD-10-CM

## 2021-02-25 ENCOUNTER — Telehealth: Payer: Self-pay | Admitting: Nurse Practitioner

## 2021-02-25 NOTE — Telephone Encounter (Signed)
Patient calling about recent results & concerns

## 2021-02-25 NOTE — Telephone Encounter (Signed)
Reviewed 6/2 labs.  Potassium is normal.  BUN is normal. Creat mildly elevated, which may suggest mild dehydration.  She is on a low dose of HCTZ.  She should be sure to hydrate adequately, but if blood pressures are stable @ home (only 96/68 in office), she may actually stop HCTZ. Total protein is mildly elevated and likely nonspecific.
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
## 2021-02-27 NOTE — Telephone Encounter (Signed)
Reviewed recommendations by provider and she was appreciative for the additional review. She wanted to go ahead and stop the HCTZ for now. She verbalized understanding of our conversation, agreement with plan, and had no further questions at this time.

## 2021-03-08 ENCOUNTER — Other Ambulatory Visit: Payer: Self-pay | Admitting: Psychiatry

## 2021-03-08 DIAGNOSIS — F4010 Social phobia, unspecified: Secondary | ICD-10-CM

## 2021-03-08 DIAGNOSIS — F331 Major depressive disorder, recurrent, moderate: Secondary | ICD-10-CM

## 2021-03-13 ENCOUNTER — Other Ambulatory Visit: Payer: Self-pay | Admitting: Podiatry

## 2021-03-13 ENCOUNTER — Other Ambulatory Visit: Payer: Self-pay | Admitting: Psychiatry

## 2021-03-13 DIAGNOSIS — F4010 Social phobia, unspecified: Secondary | ICD-10-CM

## 2021-03-14 ENCOUNTER — Other Ambulatory Visit: Payer: Self-pay | Admitting: Podiatry

## 2021-06-14 ENCOUNTER — Other Ambulatory Visit: Payer: Self-pay | Admitting: Nurse Practitioner

## 2021-07-06 ENCOUNTER — Other Ambulatory Visit: Payer: Self-pay | Admitting: Podiatry

## 2021-09-03 ENCOUNTER — Telehealth: Payer: Self-pay | Admitting: *Deleted

## 2021-09-03 NOTE — Telephone Encounter (Signed)
"  I'm a patient of Daylene Katayama.  I'm calling because my prescription has been switched on your end.  I have be on the Clobetasol Proprionate Ointment for several years now.  The last prescription that was sent from your office was the Triamcinolone Isotone Cream.  Being on this cream, first of all, there is less amount.  Secondly, I'm beginning to have the sores on my foot again from the Psoriasis.  I'm asking to be put back on the Clobetasol Proprionate, as it worked much better.  My pharmacy is [facility] on [address]."

## 2021-09-04 ENCOUNTER — Other Ambulatory Visit: Payer: Self-pay | Admitting: Podiatry

## 2021-09-04 MED ORDER — CLOBETASOL PROPIONATE 0.05 % EX OINT: TOPICAL_OINTMENT | CUTANEOUS | 3 refills | Status: DC

## 2021-09-04 NOTE — Telephone Encounter (Signed)
Not sure what is going on... I ordered clobetasol ointment (temovate). I guess the pharmacy is trying to change the prescription to a less expensive topical. Please contact the patient and let her know that the pharmacy is changing the prescription, not our office. - Thanks, Dr. Amalia Hailey

## 2021-09-04 NOTE — Telephone Encounter (Signed)
Refill for clobetasol sent to the pharmacy.  Thanks, Dr. Amalia Hailey

## 2021-09-06 NOTE — Telephone Encounter (Signed)
Please advise 

## 2021-11-09 ENCOUNTER — Telehealth: Payer: Self-pay | Admitting: Internal Medicine

## 2021-11-09 NOTE — Telephone Encounter (Signed)
Pt c/o swelling: STAT is pt has developed SOB within 24 hours  If swelling, where is the swelling located? Bilateral feet and legs  How much weight have you gained and in what time span? Yes, about 15 pounds since December  Have you gained 3 pounds in a day or 5 pounds in a week? yes  Do you have a log of your daily weights (if so, list)? Currently weight is 140, does not have a log, "natural weight is 125"  Are you currently taking a fluid pill? no  Are you currently SOB? yes  Have you traveled recently? no

## 2021-11-09 NOTE — Telephone Encounter (Signed)
Spoke with patient and reviewed her symptoms. She reports more swelling than shortness of breath. Report weight is up to 140 from her normal of 125. Reviewed that last weight we have listed is 132. She states she has had noticeable changes with swelling and is concerned. Offered appointment we had open for tomorrow and she was very Patent attorney. Encouraged her to wear compression socks/hose and elevate feet if possible to help with the swelling. She verbalized understanding of our conversation, agreement with plan, and had no further questions at this time.

## 2021-11-10 ENCOUNTER — Other Ambulatory Visit: Payer: Self-pay

## 2021-11-10 ENCOUNTER — Ambulatory Visit (INDEPENDENT_AMBULATORY_CARE_PROVIDER_SITE_OTHER): Payer: Medicare Other | Admitting: Nurse Practitioner

## 2021-11-10 ENCOUNTER — Encounter: Payer: Self-pay | Admitting: Nurse Practitioner

## 2021-11-10 VITALS — BP 144/84 | HR 86 | Ht 64.5 in | Wt 146.6 lb

## 2021-11-10 DIAGNOSIS — R600 Localized edema: Secondary | ICD-10-CM

## 2021-11-10 DIAGNOSIS — I10 Essential (primary) hypertension: Secondary | ICD-10-CM

## 2021-11-10 DIAGNOSIS — E782 Mixed hyperlipidemia: Secondary | ICD-10-CM

## 2021-11-10 NOTE — Patient Instructions (Signed)
Medication Instructions:  No changes at this time.  *If you need a refill on your cardiac medications before your next appointment, please call your pharmacy*   Lab Work: BMET & BNP tomorrow at the [facility] entrance and check in at registration.   If you have labs (blood work) drawn today and your tests are completely normal, you will receive your results only by: [place] (if you have MyChart) OR A paper copy in the mail If you have any lab test that is abnormal or we need to change your treatment, we will call you to review the results.   Testing/Procedures: None   Follow-Up: At [facility], you and your health needs are our priority.  As part of our continuing mission to provide you with exceptional heart care, we have created designated Provider Care Teams.  These Care Teams include your primary Cardiologist (physician) and Advanced Practice Providers (APPs -  Physician Assistants and Nurse Practitioners) who all work together to provide you with the care you need, when you need it.    Your next appointment:   1 month(s)  The format for your next appointment:   In Person  Provider:   Nelva Bush, MD or Murray Hodgkins, NP

## 2021-11-10 NOTE — Progress Notes (Signed)
Office Visit    Patient Name: Penny Kim Date of Encounter: 11/10/2021  Primary Care Provider:  Patient, No Pcp Per (Inactive) Primary Cardiologist:  Nelva Bush, MD  Chief Complaint    51 year old female with a history of orthostatic hypotension and syncope, hypertension, right-sided breast cancer status post bilateral mastectomies with reconstruction and chemotherapy in January 2012, anxiety, and depression, who presents for follow-up related to lower extremity edema.  Past Medical History    Past Medical History:  Diagnosis Date   Allergy    Anxiety    Breast cancer, right breast ([place]) 02/2010   s/p chemo (completed 09/2010) and right mastectomy w/reconstruction   Depression    Family history of breast cancer    Family history of lung cancer    Family history of ovarian cancer    History of echocardiogram    a. TTE 10/2016: EF 50-55%, no RWMA, normal LV diastolic function, left atrium normal, RV systolic function normal, PASP normal   History of stress test    a. treadmill Myoview 11/2016: small defect of mild severity present in the apex location felt to be secondary to breast attenuation. EF 55-65%. Normal study   HTN (hypertension)    Orthostatic hypotension    Psoriasis    Past Surgical History:  Procedure Laterality Date   BREAST RECONSTRUCTION  01/06/2012   Procedure: BREAST RECONSTRUCTION;  Surgeon: Crissie Reese, MD;  Location: [facility];  Service: Plastics;  Laterality: Bilateral;  bilateral removal of tissue expanders, bilateral placement of implants--Breast   BREAST SURGERY  06/19/2010   exploration of right mastectomy site due to post-op bleeding   INSERTION OF TISSUE EXPANDER AFTER MASTECTOMY  06/19/2010   bilat. mastectomies with bilat. tissue expanders   PORT-A-CATH REMOVAL  11/26/2010   PORTACATH PLACEMENT  07/23/2010   TISSUE EXPANDER REMOVAL  10/20/2010   left    Allergies  Allergies  Allergen Reactions   Prochlorperazine Other  (See Comments)    SYNCOPE SYNCOPE   Methadone Itching   Methadone Hcl Itching   Other    Propranolol     rash   Tape Rash   Wound Dressing Adhesive Rash    History of Present Illness    51 year old female with the above past medical history including orthostatic hypotension with syncope, hypertension, right-sided breast cancer status post bilateral mastectomies with reconstruction and chemotherapy (doxorubicin/cyclophosphamide, paclitaxel) in January 2012, anxiety, depression, and chronic pain.  She was previously evaluated in January 2017, in the setting of syncope.  Echocardiogram was performed in early 2018, showing normal LV function.  Stress testing was completed in March 2018, showing a small defect of mild severity in the apex, felt to be secondary to breast attenuation.  No ischemia was noted.  Blood pressure medications were down-titrated in the setting of syncope and orthostatic lightheadedness in 2017 however, she subsequently required up-titration due to elevated blood pressures.  [name] was last seen in cardiology clinic in May 2022 at which time she reported feeling well.  She had run out of her amlodipine and her blood pressure was 96/68.  Subsequent lab work showed mild elevation in creatinine and decision was made to discontinue HCTZ.  She notes that over the past 6 months or so, she has been experiencing lower extremity swelling and tenderness.  She has multiple other complaints including generalized malaise, fatigue, poor sleep, nocturia, urinary incontinence, and dyspnea.  She is disabled and does not work.  She does not routinely exercise and  her weight has gone up about 15 pounds in the past year.  She notes that she has a high salt diet with high intake of processed and fast foods.  She denies chest pain, palpitations, PND, orthopnea, dizziness, syncope, or early satiety.  Home Medications    Current Outpatient Medications  Medication Sig Dispense Refill   amLODipine  (NORVASC) 10 MG tablet TAKE 1 TABLET BY MOUTH EVERY DAY 90 tablet 1   clobetasol ointment (TEMOVATE) 0.05 % APPLY TO AFFECTED AREA TWICE A DAY 60 g 3   clonazePAM (KLONOPIN) 0.5 MG tablet Take 1 tablet (0.5 mg total) by mouth daily as needed. 30 tablet 0   fluticasone (FLONASE) 50 MCG/ACT nasal spray SPRAY 2 SPRAYS INTO EACH NOSTRIL EVERY DAY 48 mL 0   gabapentin (NEURONTIN) 300 MG capsule Take 2 capsules by mouth 2 (two) times daily.     triamcinolone (KENALOG) 0.025 % cream Apply topically 2 (two) times daily. 30 g 2   vortioxetine HBr (TRINTELLIX) 20 MG TABS tablet Take 1 tablet (20 mg total) by mouth daily. 90 tablet 1   zolpidem (AMBIEN) 5 MG tablet Take 1 tablet (5 mg total) by mouth at bedtime as needed for sleep. 30 tablet 0   No current facility-administered medications for this visit.     Review of Systems    Complaints of generalized malaise, fatigue, poor sleep, nocturia, urinary incontinence, and dyspnea.  She denies chest pain, palpitations, PND, orthopnea, dizziness, syncope, or early satiety.  All other systems reviewed and are otherwise negative except as noted above.    Physical Exam    VS:  BP (!) 144/84    Pulse 86    Ht 5' 4.5" (1.638 m)    Wt 146 lb 9.6 oz (66.5 kg)    SpO2 94%    BMI 24.78 kg/m  , BMI Body mass index is 24.78 kg/m.     GEN: Well nourished, well developed, in no acute distress. HEENT: normal. Neck: Supple, no JVD, carotid bruits, or masses. Cardiac: RRR, no murmurs, rubs, or gallops. No clubbing, cyanosis, trace nonpitting ankle edema.  Lower legs are tender to light palpation.  Radials/DP/PT 2+ and equal bilaterally.  Respiratory:  Respirations regular and unlabored, clear to auscultation bilaterally. GI: Soft, nontender, nondistended, BS + x 4. MS: no deformity or atrophy. Skin: warm and dry, no rash. Neuro:  Strength and sensation are intact. Psych: Normal affect.  Accessory Clinical Findings    ECG personally reviewed by me today -sinus  rhythm, 86- no acute changes.  Lab Results  Component Value Date   WBC 3.6 (L) 02/11/2021   HGB 10.8 (L) 02/11/2021   HCT 34.5 (L) 02/11/2021   MCV 84.1 02/11/2021   PLT 295 02/11/2021   Lab Results  Component Value Date   CREATININE 1.25 (H) 02/19/2021   BUN 11 02/19/2021   NA 137 02/19/2021   K 3.7 02/19/2021   CL 103 02/19/2021   CO2 23 02/19/2021   Lab Results  Component Value Date   ALT 13 02/19/2021   AST 20 02/19/2021   ALKPHOS 45 02/19/2021   BILITOT 0.3 02/19/2021   Lab Results  Component Value Date   CHOL 213 (H) 02/06/2021   HDL 62 02/06/2021   LDLCALC 120 (H) 02/06/2021   LDLDIRECT 124 (H) 09/01/2017   TRIG 178 (H) 02/06/2021   CHOLHDL 3.4 02/06/2021    Lab Results  Component Value Date   HGBA1C 5.9 (H) 01/23/2020    Assessment & Plan  1.  Lower extremity swelling: Over the past 6 months, patient notes increasing lower extremity swelling.  She has trace nonpitting ankle edema on exam today.  Her lower legs are tender to touch.  She has also had some dyspnea on exertion, though this is more of a chronic complaint.  Temporally, her lower extremity complaints seem to fall in line with when hydrochlorothiazide was discontinued last June, in the setting of a rising creatinine to 1.25 and a soft blood pressure in the 90s at clinic visit.  I suspect both her high-dose amlodipine, which she has been on for some time and has generally tolerated well, as well as a diet that is high in salt, with relatively an activity at home, have all contributed to her very mild lower extremity swelling and weight gain (up 15 pounds over the past year).  I will check a basic metabolic panel and BNP.  Based on those results, we will consider resumption of low-dose diuretic therapy with close monitoring of renal function going forward.  Likewise, if BNP is elevated, I will obtain a 2D echocardiogram.  2.  Essential hypertension: Blood pressure elevated today at 144/84.  Pressure was in  the 90s at her last visit in May.  She is currently on amlodipine 10 mg daily.  Basic metabolic panel pending.  As above, will consider resumption of low-dose diuretic.  Stressed the importance of regular activity and sodium avoidance, as her diet is very high in salty/processed/fast foods.  3.  Hyperlipidemia: She had lipids checked in May 2022, at which time her total cholesterol was 213 with an LDL of 120.  Her 10-year risk of a cardiac event, using today's blood pressure, calculates to 8.1%.  Strongly encourage regular exercise and weight loss.  4.  Anxiety: Followed by psychiatry.  5.  Nocturia/incontinence: Strongly encouraged to follow-up with primary care.  6.  Disposition: Follow-up basic metabolic panel and BNP.  Further recommendations once labs completed.  Follow-up in clinic in 1 month or sooner if necessary.   Murray Hodgkins, NP 11/10/2021, 5:37 PM

## 2021-11-12 ENCOUNTER — Encounter: Payer: Self-pay | Admitting: Nurse Practitioner

## 2021-11-12 ENCOUNTER — Telehealth: Payer: Self-pay | Admitting: *Deleted

## 2021-11-12 NOTE — Telephone Encounter (Signed)
Spoke with patient and reviewed her concerns. She did not go to the lab today and wanted to go a different day. Informed that she can go any day. She inquired what the labs are and advised they look at electrolytes and kidney function. Provided emotional support and reassurance regarding her concerns. She verbalized understanding with no further questions at this time.     [name]  P [facility] Triage (supporting Theora Gianotti, NP) 12 minutes ago (10:23 AM)   I thought it was today. Can it be rescheduled? I am so sorry, as I shouldve walked down the day I saw you, but it hurts to walk sometimes and I was so tired and weak. Is my heart okay? Thats how my mother died a couple years ago . Im scared. Please help. Fara Chute [phone]

## 2021-11-12 NOTE — Telephone Encounter (Signed)
See telephone encounter. Spoke with patient and reviewed her concerns. She did not go to the lab today and wanted to go a different day. Informed that she can go any day. She inquired what the labs are and advised they look at electrolytes and kidney function. Provided emotional support and reassurance regarding her concerns. She verbalized understanding with no further questions at this time.

## 2021-11-12 NOTE — Telephone Encounter (Signed)
Patient sent cancellation request via My Chart. Unable to find in encounters so below are the messages.    ----- Message -----  From: Penny Kim  Sent: 11/12/2021   2:33 PM EST  To: Valora Corporal, RN  Subject: FW: Appointment Canceled                           ----- Message -----  From: Penny Kim  Sent: 11/12/2021  10:05 AM EST  To: Rebeca Alert [facility] Scheduling  Subject: Appointment Canceled                             Can they come to my home for blood work? It hurts to walk. Penny Kim [phone]       Appointment Scheduled (Newest Message First) You routed conversation to Theora Gianotti, NP 46 minutes ago (3:25 PM)   You  Penny Kim 46 minutes ago (3:25 PM)   We are not able to do labs at home. Options would be here or at the [facility] entrance.   This MyChart message has not been read.   Penny Kim, Penny Kim routed conversation to [employer] 1 hour ago (2:33 PM)   Penny Kim  P [facility] Scheduling (supporting Mychart, Generic) 6 hours ago (10:05 AM)   Can they come to my home for blood work? It hurts to walk. Penny Kim [phone]    Mychart, Generic  Penny Kim 2 days ago   GM Appointment Information:     Visit Type: Office Visit         Date: 02/10/2022                 Dept: [facility]                 Provider: Harrell Gave End                 Time: 3:40 PM                 Length: 20 min   Appt Status: Canceled       Cancel Reason: Provider      Mychart, Generic  Penny Kim 3 days ago   GM Appointment Information:     Visit Type: Office Visit         Date: 02/10/2022                 Dept: [facility]                 Provider: Harrell Gave End                 Time: 3:40 PM                 Length: 20 min   Appt Status: Scheduled     Appt Instructions:    Please arrive 15 minutes prior to your appointment. This will allow [place] to  verify and update your medical record and ensure a full appointment for  you  within the time allotted.    This MyChart message has not been read.

## 2021-11-14 ENCOUNTER — Encounter: Payer: Self-pay | Admitting: Adult Health

## 2021-12-03 ENCOUNTER — Other Ambulatory Visit: Payer: Self-pay | Admitting: Podiatry

## 2021-12-04 ENCOUNTER — Other Ambulatory Visit: Payer: Self-pay | Admitting: Podiatry

## 2021-12-17 ENCOUNTER — Ambulatory Visit: Payer: Medicare Other | Admitting: Nurse Practitioner

## 2021-12-17 NOTE — Progress Notes (Deleted)
? ? ?Office Visit  ?  ?Patient Name: Penny Kim ?Date of Encounter: 12/17/2021 ? ?Primary Care Provider:  Patient, No Pcp Per (Inactive) ?Primary Cardiologist:  Nelva Bush, MD ? ?Chief Complaint  ?  ?*** ? ?Past Medical History  ?  ?Past Medical History:  ?Diagnosis Date  ? Allergy   ? Anxiety   ? Breast cancer, right breast ([place]) 02/2010  ? s/p chemo (completed 09/2010) and right mastectomy w/reconstruction  ? Depression   ? Family history of breast cancer   ? Family history of lung cancer   ? Family history of ovarian cancer   ? History of echocardiogram   ? a. TTE 10/2016: EF 50-55%, no RWMA, normal LV diastolic function, left atrium normal, RV systolic function normal, PASP normal  ? History of stress test   ? a. treadmill Myoview 11/2016: small defect of mild severity present in the apex location felt to be secondary to breast attenuation. EF 55-65%. Normal study  ? HTN (hypertension)   ? Orthostatic hypotension   ? Psoriasis   ? ?Past Surgical History:  ?Procedure Laterality Date  ? BREAST RECONSTRUCTION  01/06/2012  ? Procedure: BREAST RECONSTRUCTION;  Surgeon: Crissie Reese, MD;  Location: [facility];  Service: Plastics;  Laterality: Bilateral;  bilateral removal of tissue expanders, bilateral placement of implants--Breast  ? BREAST SURGERY  06/19/2010  ? exploration of right mastectomy site due to post-op bleeding  ? INSERTION OF TISSUE EXPANDER AFTER MASTECTOMY  06/19/2010  ? bilat. mastectomies with bilat. tissue expanders  ? PORT-A-CATH REMOVAL  11/26/2010  ? PORTACATH PLACEMENT  07/23/2010  ? TISSUE EXPANDER REMOVAL  10/20/2010  ? left  ? ? ?Allergies ? ?Allergies  ?Allergen Reactions  ? Prochlorperazine Other (See Comments)  ?  SYNCOPE ?SYNCOPE  ? Methadone Itching  ? Methadone Hcl Itching  ? Other   ? Propranolol   ?  rash  ? Tape Rash  ? Wound Dressing Adhesive Rash  ? ? ?History of Present Illness  ?  ?*** ? ?Home Medications  ?  ?Current Outpatient Medications  ?Medication Sig Dispense  Refill  ? amLODipine (NORVASC) 10 MG tablet TAKE 1 TABLET BY MOUTH EVERY DAY 90 tablet 1  ? clonazePAM (KLONOPIN) 0.5 MG tablet Take 1 tablet (0.5 mg total) by mouth daily as needed. 30 tablet 0  ? fluticasone (FLONASE) 50 MCG/ACT nasal spray SPRAY 2 SPRAYS INTO EACH NOSTRIL EVERY DAY 48 mL 0  ? gabapentin (NEURONTIN) 300 MG capsule Take 2 capsules by mouth 2 (two) times daily.    ? triamcinolone (KENALOG) 0.025 % cream Apply topically 2 (two) times daily. 30 g 2  ? triamcinolone (KENALOG) 0.025 % cream Apply daily to affected area 30 g 0  ? vortioxetine HBr (TRINTELLIX) 20 MG TABS tablet Take 1 tablet (20 mg total) by mouth daily. 90 tablet 1  ? zolpidem (AMBIEN) 5 MG tablet Take 1 tablet (5 mg total) by mouth at bedtime as needed for sleep. 30 tablet 0  ? ?No current facility-administered medications for this visit.  ?  ? ?Review of Systems  ?  ?***.  All other systems reviewed and are otherwise negative except as noted above. ?  ? ?Physical Exam  ?  ?VS:  There were no vitals taken for this visit. , BMI There is no height or weight on file to calculate BMI. ?    ?GEN: Well nourished, well developed, in no acute distress. ?HEENT: normal. ?Neck: Supple, no JVD, carotid bruits, or  masses. ?Cardiac: RRR, no murmurs, rubs, or gallops. No clubbing, cyanosis, edema.  Radials/DP/PT 2+ and equal bilaterally.  ?Respiratory:  Respirations regular and unlabored, clear to auscultation bilaterally. ?GI: Soft, nontender, nondistended, BS + x 4. ?MS: no deformity or atrophy. ?Skin: warm and dry, no rash. ?Neuro:  Strength and sensation are intact. ?Psych: Normal affect. ? ?Accessory Clinical Findings  ?  ?ECG personally reviewed by me today - *** - no acute changes. ? ?Lab Results  ?Component Value Date  ? WBC 3.6 (L) 02/11/2021  ? HGB 10.8 (L) 02/11/2021  ? HCT 34.5 (L) 02/11/2021  ? MCV 84.1 02/11/2021  ? PLT 295 02/11/2021  ? ?Lab Results  ?Component Value Date  ? CREATININE 1.25 (H) 02/19/2021  ? BUN 11 02/19/2021  ? NA 137  02/19/2021  ? K 3.7 02/19/2021  ? CL 103 02/19/2021  ? CO2 23 02/19/2021  ? ?Lab Results  ?Component Value Date  ? ALT 13 02/19/2021  ? AST 20 02/19/2021  ? ALKPHOS 45 02/19/2021  ? BILITOT 0.3 02/19/2021  ? ?Lab Results  ?Component Value Date  ? CHOL 213 (H) 02/06/2021  ? HDL 62 02/06/2021  ? LDLCALC 120 (H) 02/06/2021  ? LDLDIRECT 124 (H) 09/01/2017  ? TRIG 178 (H) 02/06/2021  ? CHOLHDL 3.4 02/06/2021  ?  ?Lab Results  ?Component Value Date  ? HGBA1C 5.9 (H) 01/23/2020  ? ? ?Assessment & Plan  ?  ?1.  *** ? ? ?Murray Hodgkins, NP ?12/17/2021, 7:32 AM ? ?

## 2021-12-23 ENCOUNTER — Ambulatory Visit: Payer: Medicare Other | Admitting: Internal Medicine

## 2021-12-23 ENCOUNTER — Other Ambulatory Visit: Payer: Medicare Other

## 2022-01-02 ENCOUNTER — Other Ambulatory Visit: Payer: Self-pay

## 2022-01-02 ENCOUNTER — Emergency Department: Payer: Medicare Other

## 2022-01-02 ENCOUNTER — Inpatient Hospital Stay
Admission: EM | Admit: 2022-01-02 | Discharge: 2022-01-05 | DRG: 918 | Disposition: A | Discharge status: Home / Self Care | Payer: Medicare Other | Attending: Family Medicine | Admitting: Family Medicine

## 2022-01-02 DIAGNOSIS — T59811A Toxic effect of smoke, accidental (unintentional), initial encounter: Principal | ICD-10-CM | POA: Diagnosis present

## 2022-01-02 DIAGNOSIS — Z885 Allergy status to narcotic agent status: Secondary | ICD-10-CM

## 2022-01-02 DIAGNOSIS — T5891XA Toxic effect of carbon monoxide from unspecified source, accidental (unintentional), initial encounter: Secondary | ICD-10-CM

## 2022-01-02 DIAGNOSIS — Z853 Personal history of malignant neoplasm of breast: Secondary | ICD-10-CM

## 2022-01-02 DIAGNOSIS — T2014XA Burn of first degree of nose (septum), initial encounter: Secondary | ICD-10-CM

## 2022-01-02 DIAGNOSIS — M87851 Other osteonecrosis, right femur: Secondary | ICD-10-CM | POA: Diagnosis present

## 2022-01-02 DIAGNOSIS — F1410 Cocaine abuse, uncomplicated: Secondary | ICD-10-CM | POA: Diagnosis present

## 2022-01-02 DIAGNOSIS — Z9221 Personal history of antineoplastic chemotherapy: Secondary | ICD-10-CM

## 2022-01-02 DIAGNOSIS — Z91048 Other nonmedicinal substance allergy status: Secondary | ICD-10-CM

## 2022-01-02 DIAGNOSIS — F419 Anxiety disorder, unspecified: Secondary | ICD-10-CM | POA: Diagnosis present

## 2022-01-02 DIAGNOSIS — R040 Epistaxis: Secondary | ICD-10-CM | POA: Diagnosis present

## 2022-01-02 DIAGNOSIS — X088XXA Exposure to other specified smoke, fire and flames, initial encounter: Principal | ICD-10-CM | POA: Diagnosis present

## 2022-01-02 DIAGNOSIS — M87852 Other osteonecrosis, left femur: Secondary | ICD-10-CM | POA: Diagnosis present

## 2022-01-02 DIAGNOSIS — Z79899 Other long term (current) drug therapy: Secondary | ICD-10-CM

## 2022-01-02 DIAGNOSIS — Z813 Family history of other psychoactive substance abuse and dependence: Secondary | ICD-10-CM

## 2022-01-02 DIAGNOSIS — R4182 Altered mental status, unspecified: Secondary | ICD-10-CM | POA: Diagnosis present

## 2022-01-02 DIAGNOSIS — Z8249 Family history of ischemic heart disease and other diseases of the circulatory system: Secondary | ICD-10-CM

## 2022-01-02 DIAGNOSIS — Z9013 Acquired absence of bilateral breasts and nipples: Secondary | ICD-10-CM

## 2022-01-02 DIAGNOSIS — I10 Essential (primary) hypertension: Secondary | ICD-10-CM | POA: Diagnosis present

## 2022-01-02 DIAGNOSIS — Y92009 Unspecified place in unspecified non-institutional (private) residence as the place of occurrence of the external cause: Secondary | ICD-10-CM

## 2022-01-02 DIAGNOSIS — Z888 Allergy status to other drugs, medicaments and biological substances status: Secondary | ICD-10-CM

## 2022-01-02 DIAGNOSIS — M87059 Idiopathic aseptic necrosis of unspecified femur: Secondary | ICD-10-CM | POA: Diagnosis present

## 2022-01-02 DIAGNOSIS — T2004XA Burn of unspecified degree of nose (septum), initial encounter: Secondary | ICD-10-CM | POA: Diagnosis present

## 2022-01-02 DIAGNOSIS — I959 Hypotension, unspecified: Secondary | ICD-10-CM | POA: Diagnosis present

## 2022-01-02 DIAGNOSIS — F1110 Opioid abuse, uncomplicated: Secondary | ICD-10-CM | POA: Diagnosis present

## 2022-01-02 DIAGNOSIS — F32A Depression, unspecified: Secondary | ICD-10-CM | POA: Diagnosis present

## 2022-01-02 DIAGNOSIS — D649 Anemia, unspecified: Secondary | ICD-10-CM | POA: Diagnosis present

## 2022-01-02 DIAGNOSIS — Z7401 Bed confinement status: Secondary | ICD-10-CM

## 2022-01-02 DIAGNOSIS — Z803 Family history of malignant neoplasm of breast: Secondary | ICD-10-CM

## 2022-01-02 DIAGNOSIS — R6510 Systemic inflammatory response syndrome (SIRS) of non-infectious origin without acute organ dysfunction: Secondary | ICD-10-CM | POA: Diagnosis present

## 2022-01-02 LAB — BASIC METABOLIC PANEL
Anion gap: 10 (ref 5–15)
BUN: 10 mg/dL (ref 6–20)
CO2: 28 mmol/L (ref 22–32)
Calcium: 8.7 mg/dL — ABNORMAL LOW (ref 8.9–10.3)
Chloride: 100 mmol/L (ref 98–111)
Creatinine, Ser: 0.88 mg/dL (ref 0.44–1.00)
GFR, Estimated: >60 mL/min (ref >60)
Glucose, Bld: 103 mg/dL — ABNORMAL HIGH (ref 70–99)
Potassium: 3.5 mmol/L (ref 3.5–5.1)
Sodium: 138 mmol/L (ref 135–145)

## 2022-01-02 LAB — BLOOD GAS, VENOUS
Acid-Base Excess: 3.2 mmol/L — ABNORMAL HIGH (ref 0.0–2.0)
Bicarbonate: 28.5 mmol/L — ABNORMAL HIGH (ref 20.0–28.0)
O2 Saturation: 80.9 %
Patient temperature: 37
pCO2, Ven: 45 mmHg (ref 44–60)
pH, Ven: 7.41 (ref 7.25–7.43)
pO2, Ven: 47 mmHg — ABNORMAL HIGH (ref 32–45)

## 2022-01-02 LAB — CBC
HCT: 29.4 % — ABNORMAL LOW (ref 36.0–46.0)
Hemoglobin: 9.1 g/dL — ABNORMAL LOW (ref 12.0–15.0)
MCH: 26.1 pg (ref 26.0–34.0)
MCHC: 31 g/dL (ref 30.0–36.0)
MCV: 84.5 fL (ref 80.0–100.0)
Platelets: 406 10*3/uL — ABNORMAL HIGH (ref 150–400)
RBC: 3.48 MIL/uL — ABNORMAL LOW (ref 3.87–5.11)
RDW: 14.9 % (ref 11.5–15.5)
WBC: 12.3 10*3/uL — ABNORMAL HIGH (ref 4.0–10.5)
nRBC: 0 % (ref 0.0–0.2)

## 2022-01-02 LAB — COOXEMETRY PANEL
Carboxyhemoglobin: 2.3 % — ABNORMAL HIGH (ref 0.5–1.5)
Methemoglobin: 1 % (ref 0.0–1.5)
O2 Saturation: 67.1 %
Total hemoglobin: 12.3 g/dL (ref 12.0–16.0)
Total oxygen content: 64.9 % OF VOL

## 2022-01-02 MED ORDER — LACTATED RINGERS IV BOLUS
1000.0000 mL | Freq: Once | INTRAVENOUS | Status: AC
  Administered 2022-01-03: 1000 mL via INTRAVENOUS

## 2022-01-02 MED ORDER — ONDANSETRON HCL 4 MG/2ML IJ SOLN
4.0000 mg | Freq: Once | INTRAMUSCULAR | Status: AC
  Administered 2022-01-02: 4 mg via INTRAVENOUS
  Filled 2022-01-02: qty 2

## 2022-01-02 MED ORDER — HYDROCODONE-ACETAMINOPHEN 5-325 MG PO TABS
1.0000 | ORAL_TABLET | Freq: Once | ORAL | Status: AC
  Administered 2022-01-02: 1 via ORAL
  Filled 2022-01-02: qty 1

## 2022-01-02 MED ORDER — LACTATED RINGERS IV BOLUS
1000.0000 mL | Freq: Once | INTRAVENOUS | Status: AC
  Administered 2022-01-02: 1000 mL via INTRAVENOUS
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
## 2022-01-02 NOTE — ED Notes (Addendum)
Patient resting comfortably on stretcher. RR even and unlabored. Patient states she needs to use the bathroom. Patient ambulated to the bathroom in the room without assistance or use of her cane. Patient has no needs or new complaints at this time. Patient returned to bed and placed back on monitoring equipment. Patient is slightly more SHOB upon walking. ?

## 2022-01-02 NOTE — ED Notes (Signed)
Respiratory notified that blood is in the lab for the venous blood gas and cooxemetry panel. Confirmed with lab that it is there. ?
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
## 2022-01-03 ENCOUNTER — Observation Stay: Payer: Medicare Other

## 2022-01-03 ENCOUNTER — Encounter: Payer: Self-pay | Admitting: Internal Medicine

## 2022-01-03 DIAGNOSIS — M87851 Other osteonecrosis, right femur: Secondary | ICD-10-CM | POA: Diagnosis present

## 2022-01-03 DIAGNOSIS — R040 Epistaxis: Secondary | ICD-10-CM | POA: Diagnosis present

## 2022-01-03 DIAGNOSIS — I959 Hypotension, unspecified: Secondary | ICD-10-CM | POA: Diagnosis present

## 2022-01-03 DIAGNOSIS — F1410 Cocaine abuse, uncomplicated: Secondary | ICD-10-CM | POA: Diagnosis present

## 2022-01-03 DIAGNOSIS — Z9013 Acquired absence of bilateral breasts and nipples: Secondary | ICD-10-CM | POA: Diagnosis not present

## 2022-01-03 DIAGNOSIS — D649 Anemia, unspecified: Secondary | ICD-10-CM

## 2022-01-03 DIAGNOSIS — Z853 Personal history of malignant neoplasm of breast: Secondary | ICD-10-CM | POA: Diagnosis not present

## 2022-01-03 DIAGNOSIS — F1110 Opioid abuse, uncomplicated: Secondary | ICD-10-CM | POA: Diagnosis present

## 2022-01-03 DIAGNOSIS — Z9221 Personal history of antineoplastic chemotherapy: Secondary | ICD-10-CM | POA: Diagnosis not present

## 2022-01-03 DIAGNOSIS — Z803 Family history of malignant neoplasm of breast: Secondary | ICD-10-CM | POA: Diagnosis not present

## 2022-01-03 DIAGNOSIS — Z813 Family history of other psychoactive substance abuse and dependence: Secondary | ICD-10-CM | POA: Diagnosis not present

## 2022-01-03 DIAGNOSIS — M87852 Other osteonecrosis, left femur: Secondary | ICD-10-CM | POA: Diagnosis present

## 2022-01-03 DIAGNOSIS — F419 Anxiety disorder, unspecified: Secondary | ICD-10-CM | POA: Diagnosis present

## 2022-01-03 DIAGNOSIS — Y92009 Unspecified place in unspecified non-institutional (private) residence as the place of occurrence of the external cause: Secondary | ICD-10-CM | POA: Diagnosis not present

## 2022-01-03 DIAGNOSIS — T5891XA Toxic effect of carbon monoxide from unspecified source, accidental (unintentional), initial encounter: Secondary | ICD-10-CM

## 2022-01-03 DIAGNOSIS — Z888 Allergy status to other drugs, medicaments and biological substances status: Secondary | ICD-10-CM | POA: Diagnosis not present

## 2022-01-03 DIAGNOSIS — T2014XA Burn of first degree of nose (septum), initial encounter: Secondary | ICD-10-CM | POA: Diagnosis not present

## 2022-01-03 DIAGNOSIS — R6510 Systemic inflammatory response syndrome (SIRS) of non-infectious origin without acute organ dysfunction: Secondary | ICD-10-CM

## 2022-01-03 DIAGNOSIS — Z79899 Other long term (current) drug therapy: Secondary | ICD-10-CM | POA: Diagnosis not present

## 2022-01-03 DIAGNOSIS — R4182 Altered mental status, unspecified: Secondary | ICD-10-CM

## 2022-01-03 DIAGNOSIS — Z8249 Family history of ischemic heart disease and other diseases of the circulatory system: Secondary | ICD-10-CM | POA: Diagnosis not present

## 2022-01-03 DIAGNOSIS — I10 Essential (primary) hypertension: Secondary | ICD-10-CM | POA: Diagnosis present

## 2022-01-03 DIAGNOSIS — T2004XA Burn of unspecified degree of nose (septum), initial encounter: Secondary | ICD-10-CM | POA: Diagnosis present

## 2022-01-03 DIAGNOSIS — Z885 Allergy status to narcotic agent status: Secondary | ICD-10-CM | POA: Diagnosis not present

## 2022-01-03 DIAGNOSIS — Z91048 Other nonmedicinal substance allergy status: Secondary | ICD-10-CM | POA: Diagnosis not present

## 2022-01-03 DIAGNOSIS — X088XXA Exposure to other specified smoke, fire and flames, initial encounter: Secondary | ICD-10-CM

## 2022-01-03 DIAGNOSIS — T59811A Toxic effect of smoke, accidental (unintentional), initial encounter: Secondary | ICD-10-CM | POA: Diagnosis present

## 2022-01-03 DIAGNOSIS — F32A Depression, unspecified: Secondary | ICD-10-CM | POA: Diagnosis present

## 2022-01-03 HISTORY — DX: Anemia, unspecified: D64.9

## 2022-01-03 HISTORY — DX: Altered mental status, unspecified: R41.82

## 2022-01-03 HISTORY — DX: Toxic effect of carbon monoxide from unspecified source, accidental (unintentional), initial encounter: T58.91XA

## 2022-01-03 LAB — BLOOD GAS, ARTERIAL
Acid-Base Excess: 5 mmol/L — ABNORMAL HIGH (ref 0.0–2.0)
Bicarbonate: 29.1 mmol/L — ABNORMAL HIGH (ref 20.0–28.0)
FIO2: 28 %
O2 Saturation: 92.4 %
Patient temperature: 37
pCO2 arterial: 40 mmHg (ref 32–48)
pH, Arterial: 7.47 — ABNORMAL HIGH (ref 7.35–7.45)
pO2, Arterial: 55 mmHg — ABNORMAL LOW (ref 83–108)

## 2022-01-03 LAB — URINALYSIS, COMPLETE (UACMP) WITH MICROSCOPIC
Bacteria, UA: NONE SEEN
Bilirubin Urine: NEGATIVE
Glucose, UA: NEGATIVE mg/dL
Hgb urine dipstick: NEGATIVE
Ketones, ur: NEGATIVE mg/dL
Leukocytes,Ua: NEGATIVE
Nitrite: NEGATIVE
Protein, ur: NEGATIVE mg/dL
Specific Gravity, Urine: 1.004 — ABNORMAL LOW (ref 1.005–1.030)
pH: 6 (ref 5.0–8.0)

## 2022-01-03 LAB — URINE DRUG SCREEN, QUALITATIVE (ARMC ONLY)
Amphetamines, Ur Screen: NOT DETECTED
Barbiturates, Ur Screen: NOT DETECTED
Benzodiazepine, Ur Scrn: NOT DETECTED
Cannabinoid 50 Ng, Ur ~~LOC~~: NOT DETECTED
Cocaine Metabolite,Ur ~~LOC~~: POSITIVE — AB
MDMA (Ecstasy)Ur Screen: NOT DETECTED
Methadone Scn, Ur: NOT DETECTED
Opiate, Ur Screen: POSITIVE — AB
Phencyclidine (PCP) Ur S: NOT DETECTED
Tricyclic, Ur Screen: NOT DETECTED

## 2022-01-03 LAB — CBC
HCT: 27.5 % — ABNORMAL LOW (ref 36.0–46.0)
Hemoglobin: 8.5 g/dL — ABNORMAL LOW (ref 12.0–15.0)
MCH: 26.4 pg (ref 26.0–34.0)
MCHC: 30.9 g/dL (ref 30.0–36.0)
MCV: 85.4 fL (ref 80.0–100.0)
Platelets: 381 10*3/uL (ref 150–400)
RBC: 3.22 MIL/uL — ABNORMAL LOW (ref 3.87–5.11)
RDW: 14.8 % (ref 11.5–15.5)
WBC: 14.9 10*3/uL — ABNORMAL HIGH (ref 4.0–10.5)
nRBC: 0 % (ref 0.0–0.2)

## 2022-01-03 LAB — TROPONIN I (HIGH SENSITIVITY)
Troponin I (High Sensitivity): 4 ng/L (ref <18)
Troponin I (High Sensitivity): 4 ng/L (ref <18)

## 2022-01-03 LAB — CREATININE, SERUM
Creatinine, Ser: 1.16 mg/dL — ABNORMAL HIGH (ref 0.44–1.00)
GFR, Estimated: 57 mL/min — ABNORMAL LOW (ref >60)

## 2022-01-03 LAB — TYPE AND SCREEN
ABO/RH(D): A POS
Antibody Screen: NEGATIVE

## 2022-01-03 LAB — HIV ANTIBODY (ROUTINE TESTING W REFLEX): HIV Screen 4th Generation wRfx: NONREACTIVE

## 2022-01-03 MED ORDER — HEPARIN SODIUM (PORCINE) 5000 UNIT/ML IJ SOLN
5000.0000 [IU] | Freq: Three times a day (TID) | INTRAMUSCULAR | Status: DC
  Administered 2022-01-03 – 2022-01-05 (×6): 5000 [IU] via SUBCUTANEOUS
  Filled 2022-01-03 (×6): qty 1

## 2022-01-03 MED ORDER — VORTIOXETINE HBR 5 MG PO TABS
20.0000 mg | ORAL_TABLET | Freq: Every day | ORAL | Status: DC
  Administered 2022-01-03 – 2022-01-05 (×3): 20 mg via ORAL
  Filled 2022-01-03 (×3): qty 4

## 2022-01-03 MED ORDER — SODIUM CHLORIDE 0.9 % IV SOLN
1.5000 g | Freq: Four times a day (QID) | INTRAVENOUS | Status: DC
  Administered 2022-01-03 – 2022-01-05 (×10): 1.5 g via INTRAVENOUS
  Filled 2022-01-03 (×12): qty 4
  Filled 2022-01-03: qty 1.5

## 2022-01-03 MED ORDER — ACETAMINOPHEN 325 MG PO TABS
650.0000 mg | ORAL_TABLET | Freq: Four times a day (QID) | ORAL | Status: DC | PRN
  Administered 2022-01-03 – 2022-01-05 (×5): 650 mg via ORAL
  Filled 2022-01-03 (×5): qty 2

## 2022-01-03 MED ORDER — CLONAZEPAM 0.5 MG PO TABS
0.5000 mg | ORAL_TABLET | Freq: Once | ORAL | Status: AC
  Administered 2022-01-03: 0.5 mg via ORAL
  Filled 2022-01-03: qty 1

## 2022-01-03 MED ORDER — FLUTICASONE PROPIONATE 50 MCG/ACT NA SUSP
1.0000 | Freq: Every day | NASAL | Status: DC
  Administered 2022-01-03 – 2022-01-05 (×3): 1 via NASAL
  Filled 2022-01-03: qty 16

## 2022-01-03 MED ORDER — SODIUM CHLORIDE 0.9 % IV SOLN: INTRAVENOUS | Status: AC

## 2022-01-03 MED ORDER — PANTOPRAZOLE SODIUM 40 MG IV SOLR
40.0000 mg | Freq: Two times a day (BID) | INTRAVENOUS | Status: DC
Start: 2022-01-03 — End: 2022-01-05
  Administered 2022-01-03 – 2022-01-05 (×6): 40 mg via INTRAVENOUS
  Filled 2022-01-03 (×6): qty 10

## 2022-01-03 MED ORDER — TRIAMCINOLONE ACETONIDE 0.025 % EX CREA
TOPICAL_CREAM | Freq: Two times a day (BID) | CUTANEOUS | Status: DC
  Filled 2022-01-03: qty 15
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
## 2022-01-03 NOTE — Progress Notes (Signed)
Overnight pulse ox placed on pt.  ?

## 2022-01-03 NOTE — Assessment & Plan Note (Addendum)
Hold blood pressure medication as blood pressure is on the soft side ?

## 2022-01-03 NOTE — Assessment & Plan Note (Addendum)
Resume home medications

## 2022-01-03 NOTE — Assessment & Plan Note (Addendum)
Her blood pressure remained low. There is no signs of any infection. ?She does meet SIRS criteria,  was started empirically on Unasyn. ?Calcitonin negative,  antibiotic discontinued. ?
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
## 2022-01-03 NOTE — Assessment & Plan Note (Addendum)
Continue empiric antibiotic Unasyn. ?De-escalate antibiotics if able to. ?

## 2022-01-03 NOTE — ED Notes (Signed)
Respiratory notified that patient needs a simple face mask and an ABG. Patient continues to act a little unusual. She answers questions appropriately but then talks about unusual things not pertanent to her stay. She is drowsy which may be due to her medication but we will have a CT just in case. ?

## 2022-01-03 NOTE — Assessment & Plan Note (Addendum)
Likely related to substance abuse.   ?CT head unremarkable.  Patient is more alert and awake. ?She responds answers appropriately.  She is alert and oriented x3 ?Could be related to smoking elation.  UDS cocaine and opiates+ ?

## 2022-01-03 NOTE — Assessment & Plan Note (Addendum)
Case discussed with the ENT recommended to observe. ?Since patient does not have hoarseness, chest tightness, difficulty breathing, difficulty swallowing. ?Patient has appointment with ENT Dr. Tami Ribas in the morning tomorrow ?
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
## 2022-01-03 NOTE — Assessment & Plan Note (Addendum)
Normochromic normocytic anemia ?There is no any obvious signs of bleeding. ?Continue to monitor H&H ?

## 2022-01-03 NOTE — ED Notes (Signed)
Respiratory at bedside to collect ABG and place patient on  simple face mask. ?

## 2022-01-03 NOTE — Assessment & Plan Note (Addendum)
Left hip :chronic degenerative changes. ?Right hip: Chronic avascular necrosis ? ?

## 2022-01-03 NOTE — ED Notes (Signed)
Notified the floor that a nurse needs to be assigned and that I would like to speak with them. The nurse will call me back shortly. ?
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
## 2022-01-04 ENCOUNTER — Encounter: Payer: Self-pay | Admitting: Internal Medicine

## 2022-01-04 DIAGNOSIS — T59811A Toxic effect of smoke, accidental (unintentional), initial encounter: Secondary | ICD-10-CM | POA: Diagnosis not present

## 2022-01-04 LAB — BASIC METABOLIC PANEL
Anion gap: 7 (ref 5–15)
BUN: 9 mg/dL (ref 6–20)
CO2: 28 mmol/L (ref 22–32)
Calcium: 9 mg/dL (ref 8.9–10.3)
Chloride: 105 mmol/L (ref 98–111)
Creatinine, Ser: 0.8 mg/dL (ref 0.44–1.00)
GFR, Estimated: >60 mL/min (ref >60)
Glucose, Bld: 111 mg/dL — ABNORMAL HIGH (ref 70–99)
Potassium: 3.7 mmol/L (ref 3.5–5.1)
Sodium: 140 mmol/L (ref 135–145)

## 2022-01-04 LAB — CBC
HCT: 28.2 % — ABNORMAL LOW (ref 36.0–46.0)
Hemoglobin: 8.7 g/dL — ABNORMAL LOW (ref 12.0–15.0)
MCH: 26.3 pg (ref 26.0–34.0)
MCHC: 30.9 g/dL (ref 30.0–36.0)
MCV: 85.2 fL (ref 80.0–100.0)
Platelets: 377 10*3/uL (ref 150–400)
RBC: 3.31 MIL/uL — ABNORMAL LOW (ref 3.87–5.11)
RDW: 14.8 % (ref 11.5–15.5)
WBC: 7.2 10*3/uL (ref 4.0–10.5)
nRBC: 0 % (ref 0.0–0.2)

## 2022-01-04 MED ORDER — IBUPROFEN 400 MG PO TABS
600.0000 mg | ORAL_TABLET | Freq: Once | ORAL | Status: AC
  Administered 2022-01-04: 600 mg via ORAL
  Filled 2022-01-04: qty 2

## 2022-01-04 NOTE — Progress Notes (Signed)
?  Transition of Care (TOC) Screening Note ? ? ?Patient Details  ?Name: Penny Kim ?Date of Birth: 04-24-1971 ? ? ?Transition of Care (TOC) CM/SW Contact:    ?Alberteen Sam, LCSW ?Phone Number: ?01/04/2022, 9:27 AM ? ? ? ?Transition of Care Department [facility]) has reviewed patient and no TOC needs have been identified at this time. We will continue to monitor patient advancement through interdisciplinary progression rounds. If new patient transition needs arise, please place a TOC consult. ? Pricilla Riffle, [place] ?[phone] ? ?
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
## 2022-01-05 ENCOUNTER — Encounter: Payer: Self-pay | Admitting: Internal Medicine

## 2022-01-05 DIAGNOSIS — T59811A Toxic effect of smoke, accidental (unintentional), initial encounter: Secondary | ICD-10-CM | POA: Diagnosis not present

## 2022-01-05 LAB — BASIC METABOLIC PANEL
Anion gap: 8 (ref 5–15)
BUN: 10 mg/dL (ref 6–20)
CO2: 29 mmol/L (ref 22–32)
Calcium: 8.9 mg/dL (ref 8.9–10.3)
Chloride: 104 mmol/L (ref 98–111)
Creatinine, Ser: 0.75 mg/dL (ref 0.44–1.00)
GFR, Estimated: >60 mL/min (ref >60)
Glucose, Bld: 95 mg/dL (ref 70–99)
Potassium: 3.6 mmol/L (ref 3.5–5.1)
Sodium: 141 mmol/L (ref 135–145)

## 2022-01-05 LAB — PHOSPHORUS: Phosphorus: 4.7 mg/dL — ABNORMAL HIGH (ref 2.5–4.6)

## 2022-01-05 LAB — CBC
HCT: 26.8 % — ABNORMAL LOW (ref 36.0–46.0)
Hemoglobin: 8.4 g/dL — ABNORMAL LOW (ref 12.0–15.0)
MCH: 26.3 pg (ref 26.0–34.0)
MCHC: 31.3 g/dL (ref 30.0–36.0)
MCV: 83.8 fL (ref 80.0–100.0)
Platelets: 353 10*3/uL (ref 150–400)
RBC: 3.2 MIL/uL — ABNORMAL LOW (ref 3.87–5.11)
RDW: 14.2 % (ref 11.5–15.5)
WBC: 6.6 10*3/uL (ref 4.0–10.5)
nRBC: 0 % (ref 0.0–0.2)

## 2022-01-05 LAB — MAGNESIUM: Magnesium: 2 mg/dL (ref 1.7–2.4)
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
## 2022-01-05 NOTE — Plan of Care (Signed)

## 2022-01-06 ENCOUNTER — Other Ambulatory Visit: Payer: Self-pay | Admitting: Podiatry

## 2022-01-08 LAB — CULTURE, BLOOD (ROUTINE X 2)
Culture: NO GROWTH
Culture: NO GROWTH
Special Requests: ADEQUATE
Special Requests: ADEQUATE

## 2022-01-12 ENCOUNTER — Telehealth: Payer: Self-pay | Admitting: Adult Health

## 2022-01-12 NOTE — Telephone Encounter (Signed)
Rescheduled appointment per provider template. Patient is aware of the changes made to her upcoming appointment 
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
## 2022-01-21 ENCOUNTER — Ambulatory Visit: Payer: Medicare Other | Admitting: Internal Medicine

## 2022-02-03 ENCOUNTER — Inpatient Hospital Stay: Payer: Medicare Other | Attending: Adult Health | Admitting: Physician Assistant

## 2022-02-10 ENCOUNTER — Telehealth: Payer: Self-pay | Admitting: Physician Assistant

## 2022-02-10 ENCOUNTER — Ambulatory Visit: Payer: Medicare Other | Admitting: Internal Medicine

## 2022-02-10 NOTE — Telephone Encounter (Signed)
Scheduled per 5/23 secure chat w/ provider, message left with pt

## 2022-02-11 ENCOUNTER — Encounter: Payer: Medicare Other | Admitting: Adult Health

## 2022-02-18 ENCOUNTER — Ambulatory Visit: Payer: Medicare Other | Admitting: Medical
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
## 2022-03-30 ENCOUNTER — Inpatient Hospital Stay: Payer: Medicare Other | Admitting: Physician Assistant

## 2022-04-13 ENCOUNTER — Ambulatory Visit: Payer: Medicare Other | Admitting: Medical

## 2022-04-21 ENCOUNTER — Ambulatory Visit: Payer: Medicare Other | Admitting: Internal Medicine

## 2022-04-27 ENCOUNTER — Inpatient Hospital Stay: Payer: Medicare Other | Attending: Adult Health | Admitting: Physician Assistant

## 2022-05-12 ENCOUNTER — Ambulatory Visit: Payer: Medicare Other | Admitting: Internal Medicine
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
## 2022-07-27 ENCOUNTER — Telehealth: Payer: Self-pay | Admitting: Internal Medicine

## 2022-07-27 MED ORDER — AMLODIPINE BESYLATE 10 MG PO TABS: 10.0000 mg | ORAL_TABLET | Freq: Every day | ORAL | 0 refills | Status: DC

## 2022-07-27 NOTE — Telephone Encounter (Signed)
I'll send in enough to get her to that appt.

## 2022-07-27 NOTE — Telephone Encounter (Signed)
Please advise if ok to refill prescription or decline request. This patient is a patient of Dr. Darnelle Bos.  Patient has cancelled multiple appointments and no showed once. Penny Kim does have an appointment with Gerrie Nordmann on 08-11-22.

## 2022-07-27 NOTE — Telephone Encounter (Signed)
*  STAT* If patient is at the pharmacy, call can be transferred to refill team.   1. Which medications need to be refilled? (please list name of each medication and dose if known)   amLODipine (NORVASC) 10 MG tablet    2. Which pharmacy/location (including street and city if local pharmacy) is medication to be sent to?  [id number] [place]   3. Do they need a 30 day or 90 day supply?  90 day

## 2022-07-28 ENCOUNTER — Telehealth: Payer: Self-pay | Admitting: Physician Assistant

## 2022-07-28 NOTE — Telephone Encounter (Signed)
Called patient per 11/7 in basket. Patient notified of new appointment.

## 2022-08-03 ENCOUNTER — Encounter: Payer: Self-pay | Admitting: *Deleted

## 2022-08-10 NOTE — Progress Notes (Deleted)
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 TTE 10/2016: EF 50-55%, no RWMA, normal LV diastolic function, left atrium normal, RV systolic function normal, PASP normal   History of stress test    a. treadmill Myoview 11/2016: small defect of mild severity present in the apex location felt to be secondary to breast attenuation. EF 55-65%. Normal study   HTN (hypertension)    Orthostatic hypotension    Psoriasis    Past Surgical History:  Procedure Laterality Date   BREAST RECONSTRUCTION  01/06/2012   Procedure: BREAST RECONSTRUCTION;  Surgeon: Crissie Reese, MD;  Location: [facility];  Service: Plastics;  Laterality: Bilateral;  bilateral removal of tissue expanders, bilateral placement of implants--Breast   BREAST SURGERY  06/19/2010   exploration of right mastectomy site due to post-op bleeding   INSERTION OF TISSUE EXPANDER AFTER MASTECTOMY  06/19/2010   bilat. mastectomies with bilat. tissue expanders   PORT-A-CATH REMOVAL  11/26/2010   PORTACATH PLACEMENT  07/23/2010   TISSUE EXPANDER REMOVAL  10/20/2010   left    Allergies  Allergies  Allergen Reactions   Prochlorperazine Other (See Comments)    SYNCOPE SYNCOPE   Methadone Itching   Methadone Hcl Itching   Other    Propranolol     rash   Tape Rash   Wound Dressing Adhesive Rash    History of  Present Illness    ***  Home Medications    Current Outpatient Medications  Medication Sig Dispense Refill   amLODipine (NORVASC) 10 MG tablet Take 1 tablet (10 mg total) by mouth daily. Needs to keep appt for further refills 15 tablet 0   clonazePAM (KLONOPIN) 1 MG tablet Take 1 mg by mouth 4 (four) times daily as needed.     fluticasone (FLONASE) 50 MCG/ACT nasal spray SPRAY 2 SPRAYS INTO EACH NOSTRIL EVERY DAY 48 mL 0   gabapentin (NEURONTIN) 300 MG capsule Take 2 capsules by mouth 2 (two) times daily.     triamcinolone (KENALOG) 0.025 % cream Apply topically 2 (two) times daily. 30 g 2   triamcinolone (KENALOG) 0.025 % cream Apply daily to affected area 30 g 0   TRINTELLIX 10 MG TABS tablet Take 10 mg by mouth daily.     No current facility-administered medications for this visit.     Family History    Family History  Problem Relation Age of Onset   Hypertension Mother    Depression Mother    Heart attack Mother    Hyperlipidemia Mother    Cancer Sister 81       Ovarian cancer   Hypertension Maternal Grandmother    Diabetes Maternal Grandmother    Cancer Maternal Grandfather        Lung cancer - Armed forces logistics/support/administrative officer and smoker  Hypertension Maternal Grandfather    Diabetes Paternal Grandmother    Hypertension Paternal Grandmother    Kidney disease Paternal Grandmother        End stage renal dz - dialysis   Hyperlipidemia Paternal Grandmother    Breast cancer Paternal Grandmother    Alcohol abuse Father    Depression Father    Drug abuse Father 75       Died of drug overdose   She indicated that her mother is deceased. She indicated that her father is deceased. She indicated that her sister is alive. She indicated that her maternal grandmother is alive. She indicated that her maternal grandfather is deceased. She indicated that her paternal grandmother is deceased. She indicated that her paternal grandfather is deceased.  Social History    Social History   Socioeconomic  History   Marital status: Single    Spouse name: Not on file   Number of children: Not on file   Years of education: 18   Highest education level: Not on file  Occupational History   Occupation: Scientist, research (medical) Relations    Comment: Ambulance person  Tobacco Use   Smoking status: Never   Smokeless tobacco: Never  Vaping Use   Vaping Use: Never used  Substance and Sexual Activity   Alcohol use: No    Comment: occasionally   Drug use: No   Sexual activity: Not Currently    Birth control/protection: None  Other Topics Concern   Not on file  Social History Narrative   Celisa [place], [place]. She attended [employer] where she obtained her Bachelors of Science degree in Psychology. 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 She currently lives in [place]. She lives alone. Akanksha enjoys attending live musical performances. 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    Social Determinants of Health   Financial Resource Strain: Not on file  Food Insecurity: Not on file  Transportation Needs: Not on file  Physical Activity: Not on file  Stress: Not on file  Social Connections: Not on file  Intimate Partner Violence: Not on file     Review of Systems    General:  No chills, fever, night sweats or weight changes.  Cardiovascular:  No chest pain, dyspnea on exertion, edema, orthopnea, palpitations, paroxysmal nocturnal dyspnea. Dermatological: No rash, lesions/masses Respiratory: No cough, dyspnea Urologic: No hematuria, dysuria Abdominal:   No nausea, vomiting, diarrhea, bright red blood per rectum, melena, or hematemesis Neurologic:  No visual changes, wkns, changes in mental status. All other systems reviewed and are otherwise negative except as noted above.     Physical Exam    VS:  There were no vitals taken for this visit. , BMI There is no  height or weight on file to calculate BMI.     GEN: Well nourished, well developed, in no acute distress. HEENT: normal. Neck: Supple, no JVD, carotid bruits, or masses. Cardiac: RRR, no murmurs, rubs, or gallops. No clubbing, cyanosis, edema.  Radials/DP/PT 2+ and equal bilaterally.  Respiratory:  Respirations regular and unlabored, clear to auscultation bilaterally. GI: Soft, nontender, nondistended, BS + x 4. MS: no deformity or atrophy. Skin: warm and dry, no rash. Neuro:  Strength and sensation are intact. Psych: Normal affect.  Accessory Clinical Findings    ECG personally reviewed by me today- *** - No acute changes  Lab Results  Component Value Date  WBC 6.6 01/05/2022   HGB 8.4 (L) 01/05/2022   HCT 26.8 (L) 01/05/2022   MCV 83.8 01/05/2022   PLT 353 01/05/2022   Lab Results  Component Value Date   CREATININE 0.75 01/05/2022   BUN 10 01/05/2022   NA 141 01/05/2022   K 3.6 01/05/2022   CL 104 01/05/2022   CO2 29 01/05/2022   Lab Results  Component Value Date   ALT 13 02/19/2021   AST 20 02/19/2021   ALKPHOS 45 02/19/2021   BILITOT 0.3 02/19/2021   Lab Results  Component Value Date   CHOL 213 (H) 02/06/2021   HDL 62 02/06/2021   LDLCALC 120 (H) 02/06/2021   LDLDIRECT 124 (H) 09/01/2017   TRIG 178 (H) 02/06/2021   CHOLHDL 3.4 02/06/2021    Lab Results  Component Value Date   HGBA1C 5.9 (H) 01/23/2020    Assessment & Plan   1.  ***  Haileigh Pitz, NP 08/10/2022, 3:19 PM

## 2022-08-11 ENCOUNTER — Encounter: Payer: Self-pay | Admitting: Cardiology

## 2022-08-11 ENCOUNTER — Ambulatory Visit: Payer: Medicare Other | Attending: Cardiology | Admitting: Cardiology

## 2022-08-13 ENCOUNTER — Emergency Department
Admission: EM | Admit: 2022-08-13 | Discharge: 2022-08-13 | Disposition: A | Discharge status: Home / Self Care | Payer: Medicare Other | Attending: Emergency Medicine | Admitting: Emergency Medicine

## 2022-08-13 DIAGNOSIS — R443 Hallucinations, unspecified: Secondary | ICD-10-CM | POA: Diagnosis present

## 2022-08-13 DIAGNOSIS — F1920 Other psychoactive substance dependence, uncomplicated: Secondary | ICD-10-CM | POA: Diagnosis not present

## 2022-08-13 DIAGNOSIS — F1490 Cocaine use, unspecified, uncomplicated: Secondary | ICD-10-CM | POA: Diagnosis not present

## 2022-08-13 LAB — COMPREHENSIVE METABOLIC PANEL
ALT: 17 U/L (ref 0–44)
AST: 47 U/L — ABNORMAL HIGH (ref 15–41)
Albumin: 4.3 g/dL (ref 3.5–5.0)
Alkaline Phosphatase: 62 U/L (ref 38–126)
Anion gap: 13 (ref 5–15)
BUN: 11 mg/dL (ref 6–20)
CO2: 24 mmol/L (ref 22–32)
Calcium: 9.6 mg/dL (ref 8.9–10.3)
Chloride: 102 mmol/L (ref 98–111)
Creatinine, Ser: 0.88 mg/dL (ref 0.44–1.00)
GFR, Estimated: >60 mL/min (ref >60)
Glucose, Bld: 91 mg/dL (ref 70–99)
Potassium: 4.1 mmol/L (ref 3.5–5.1)
Sodium: 139 mmol/L (ref 135–145)
Total Bilirubin: 0.7 mg/dL (ref 0.3–1.2)
Total Protein: 8.3 g/dL — ABNORMAL HIGH (ref 6.5–8.1)

## 2022-08-13 LAB — CBC
HCT: 33.7 % — ABNORMAL LOW (ref 36.0–46.0)
Hemoglobin: 10.7 g/dL — ABNORMAL LOW (ref 12.0–15.0)
MCH: 24.2 pg — ABNORMAL LOW (ref 26.0–34.0)
MCHC: 31.8 g/dL (ref 30.0–36.0)
MCV: 76.2 fL — ABNORMAL LOW (ref 80.0–100.0)
Platelets: 350 10*3/uL (ref 150–400)
RBC: 4.42 MIL/uL (ref 3.87–5.11)
RDW: 15.4 % (ref 11.5–15.5)
WBC: 5.4 10*3/uL (ref 4.0–10.5)
nRBC: 0 % (ref 0.0–0.2)

## 2022-08-13 LAB — URINE DRUG SCREEN, QUALITATIVE (ARMC ONLY)
Amphetamines, Ur Screen: NOT DETECTED
Barbiturates, Ur Screen: NOT DETECTED
Benzodiazepine, Ur Scrn: NOT DETECTED
Cannabinoid 50 Ng, Ur ~~LOC~~: NOT DETECTED
Cocaine Metabolite,Ur ~~LOC~~: POSITIVE — AB
MDMA (Ecstasy)Ur Screen: NOT DETECTED
Methadone Scn, Ur: NOT DETECTED
Opiate, Ur Screen: NOT DETECTED
Phencyclidine (PCP) Ur S: NOT DETECTED
Tricyclic, Ur Screen: NOT DETECTED

## 2022-08-13 LAB — ACETAMINOPHEN LEVEL: Acetaminophen (Tylenol), Serum: <10 ug/mL — ABNORMAL LOW (ref 10–30)

## 2022-08-13 LAB — SALICYLATE LEVEL: Salicylate Lvl: 13.6 mg/dL (ref 7.0–30.0)

## 2022-08-13 LAB — ETHANOL: Alcohol, Ethyl (B): <10 mg/dL (ref <10)
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
## 2022-08-13 NOTE — ED Notes (Addendum)
Pt dressed out by this RN and EDT  1 pair of sneakers 1 pair of socks 1 pair of striped pants 1 striped shirt 1 lack jacket 1 ipad 2 headwraps 1 cane
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
## 2022-08-13 NOTE — ED Notes (Signed)
Pt moved to 24H and into a recliner. Pt walked with steady gait and personal cane.

## 2022-08-13 NOTE — ED Triage Notes (Signed)
First Nurse: Pt here via [facility] with cocaine use. Pt states she did 8 lines at 0230. Pt is talking to herself and is seeing things that are not there.  169/88 100% 106 110-cbg

## 2022-08-13 NOTE — Discharge Instructions (Addendum)
You were seen in the emergency department for dry skin of your feet, concern that there was lint in your skin and for cocaine use.  You stated that you wanted help with your cocaine abuse problem.  You are given information for some resources as an outpatient.  It is important that you call and try to get help for your cocaine use.  Use a Vaseline lotion to your feet and lower legs for your dry skin.  Follow-up with your primary care physician for your eczema.
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
## 2022-08-17 ENCOUNTER — Encounter: Payer: Medicare Other | Admitting: Physician Assistant

## 2022-09-01 ENCOUNTER — Other Ambulatory Visit (HOSPITAL_COMMUNITY)
Admission: EM | Admit: 2022-09-01 | Discharge: 2022-09-04 | Disposition: A | Discharge status: Home / Self Care | Payer: Medicare Other | Attending: Psychiatry | Admitting: Psychiatry

## 2022-09-01 DIAGNOSIS — Z79899 Other long term (current) drug therapy: Secondary | ICD-10-CM | POA: Insufficient documentation

## 2022-09-01 DIAGNOSIS — Z1152 Encounter for screening for COVID-19: Secondary | ICD-10-CM | POA: Insufficient documentation

## 2022-09-01 DIAGNOSIS — F32A Depression, unspecified: Secondary | ICD-10-CM | POA: Insufficient documentation

## 2022-09-01 DIAGNOSIS — F1410 Cocaine abuse, uncomplicated: Secondary | ICD-10-CM | POA: Diagnosis present

## 2022-09-01 DIAGNOSIS — F419 Anxiety disorder, unspecified: Secondary | ICD-10-CM | POA: Insufficient documentation

## 2022-09-01 LAB — CBC WITH DIFFERENTIAL/PLATELET
Abs Immature Granulocytes: 0 10*3/uL (ref 0.00–0.07)
Basophils Absolute: 0.1 10*3/uL (ref 0.0–0.1)
Basophils Relative: 1 %
Eosinophils Absolute: 0.1 10*3/uL (ref 0.0–0.5)
Eosinophils Relative: 1 %
HCT: 33.7 % — ABNORMAL LOW (ref 36.0–46.0)
Hemoglobin: 11.3 g/dL — ABNORMAL LOW (ref 12.0–15.0)
Immature Granulocytes: 0 %
Lymphocytes Relative: 28 %
Lymphs Abs: 1.3 10*3/uL (ref 0.7–4.0)
MCH: 26.2 pg (ref 26.0–34.0)
MCHC: 33.5 g/dL (ref 30.0–36.0)
MCV: 78.2 fL — ABNORMAL LOW (ref 80.0–100.0)
Monocytes Absolute: 0.3 10*3/uL (ref 0.1–1.0)
Monocytes Relative: 7 %
Neutro Abs: 2.8 10*3/uL (ref 1.7–7.7)
Neutrophils Relative %: 63 %
Platelets: 341 10*3/uL (ref 150–400)
RBC: 4.31 MIL/uL (ref 3.87–5.11)
RDW: 15.9 % — ABNORMAL HIGH (ref 11.5–15.5)
WBC: 4.5 10*3/uL (ref 4.0–10.5)
nRBC: 0 % (ref 0.0–0.2)

## 2022-09-01 LAB — POCT URINE DRUG SCREEN - MANUAL ENTRY (I-SCREEN)
POC Amphetamine UR: NOT DETECTED
POC Buprenorphine (BUP): NOT DETECTED
POC Cocaine UR: POSITIVE — AB
POC Marijuana UR: NOT DETECTED
POC Methadone UR: NOT DETECTED
POC Methamphetamine UR: NOT DETECTED
POC Morphine: NOT DETECTED
POC Oxazepam (BZO): POSITIVE — AB
POC Oxycodone UR: NOT DETECTED
POC Secobarbital (BAR): NOT DETECTED

## 2022-09-01 LAB — COMPREHENSIVE METABOLIC PANEL
ALT: 17 U/L (ref 0–44)
AST: 23 U/L (ref 15–41)
Albumin: 4.1 g/dL (ref 3.5–5.0)
Alkaline Phosphatase: 58 U/L (ref 38–126)
Anion gap: 10 (ref 5–15)
BUN: 6 mg/dL (ref 6–20)
CO2: 28 mmol/L (ref 22–32)
Calcium: 9.8 mg/dL (ref 8.9–10.3)
Chloride: 102 mmol/L (ref 98–111)
Creatinine, Ser: 0.82 mg/dL (ref 0.44–1.00)
GFR, Estimated: >60 mL/min (ref >60)
Glucose, Bld: 109 mg/dL — ABNORMAL HIGH (ref 70–99)
Potassium: 4 mmol/L (ref 3.5–5.1)
Sodium: 140 mmol/L (ref 135–145)
Total Bilirubin: 0.2 mg/dL — ABNORMAL LOW (ref 0.3–1.2)
Total Protein: 7.7 g/dL (ref 6.5–8.1)

## 2022-09-01 LAB — URINALYSIS, ROUTINE W REFLEX MICROSCOPIC
Bacteria, UA: NONE SEEN
Bilirubin Urine: NEGATIVE
Glucose, UA: NEGATIVE mg/dL
Hgb urine dipstick: NEGATIVE
Ketones, ur: NEGATIVE mg/dL
Leukocytes,Ua: NEGATIVE
Nitrite: NEGATIVE
Protein, ur: 100 mg/dL — AB
Specific Gravity, Urine: 1.025 (ref 1.005–1.030)
pH: 5 (ref 5.0–8.0)

## 2022-09-01 LAB — LIPID PANEL
Cholesterol: 217 mg/dL — ABNORMAL HIGH (ref 0–200)
HDL: 77 mg/dL (ref >40)
LDL Cholesterol: 125 mg/dL — ABNORMAL HIGH (ref 0–99)
Total CHOL/HDL Ratio: 2.8 RATIO
Triglycerides: 77 mg/dL (ref <150)
VLDL: 15 mg/dL (ref 0–40)

## 2022-09-01 LAB — RESP PANEL BY RT-PCR (RSV, FLU A&B, COVID)  RVPGX2
Influenza A by PCR: NEGATIVE
Influenza B by PCR: NEGATIVE
Resp Syncytial Virus by PCR: NEGATIVE
SARS Coronavirus 2 by RT PCR: NEGATIVE

## 2022-09-01 LAB — ETHANOL: Alcohol, Ethyl (B): <10 mg/dL (ref <10)

## 2022-09-01 LAB — TSH: TSH: 1.372 u[IU]/mL (ref 0.350–4.500)

## 2022-09-01 LAB — POC URINE PREG, ED: Preg Test, Ur: NEGATIVE

## 2022-09-01 MED ORDER — TRAZODONE HCL 50 MG PO TABS: 50.0000 mg | ORAL_TABLET | Freq: Every evening | ORAL | Status: DC | PRN

## 2022-09-01 MED ORDER — ALUM & MAG HYDROXIDE-SIMETH 200-200-20 MG/5ML PO SUSP: 30.0000 mL | ORAL | Status: DC | PRN

## 2022-09-01 MED ORDER — ACETAMINOPHEN 325 MG PO TABS: 650.0000 mg | ORAL_TABLET | Freq: Four times a day (QID) | ORAL | Status: DC | PRN

## 2022-09-01 MED ORDER — HYDROXYZINE HCL 25 MG PO TABS: 25.0000 mg | ORAL_TABLET | Freq: Three times a day (TID) | ORAL | Status: DC | PRN

## 2022-09-01 MED ORDER — MAGNESIUM HYDROXIDE 400 MG/5ML PO SUSP: 30.0000 mL | Freq: Every day | ORAL | Status: DC | PRN

## 2022-09-01 NOTE — ED Provider Notes (Signed)
Facility Based Crisis Admission H&P  Date: 09/01/22 Patient Name: Penny Kim MRN: [id number] Chief Complaint: "cocaine" Chief Complaint  Patient presents with   Addiction Problem     Diagnoses:  Final diagnoses:  Cocaine abuse [facility])   HPI: Pt presents voluntarily to [facility] behavioral health for walk-in assessment.  Pt is assessed face-to-face by nurse practitioner.   [name], 51 y.o., female patient seen face to face by this provider, consulted with Dr. Leverne Humbles; and chart reviewed on 09/01/22.  On evaluation Penny Kim reports presenting today due to "cocaine". Pt reports periodic use of cocaine which has been increasing since February/March of this year. She reports snorting cocaine, at least 2 times a week. She reports use of more than 2 lines per occasion. She reports use of 1g of cocaine/day. She reports last use was prior to arrival at this facility because she wanted to "show you guys how I am on cocaine". Pt reports she is interested in a 3 month or longer residential program. Pt reports she has been experiencing congestion, nasal drainage since February/March of this year.   Pt reports anxious mood. She states she needs helping with substance use. She reports loss of appetite, with estimated weight loss of about 7 lbs, which she attributes to cocaine use. She reports sleep is 6 to 10 hours/night.   Pt denies suicidal, homicidal, or violent ideation. She denies auditory visual hallucinations.   Pt denies history of non suicidal self injurious behavior. She reports history of 1 suicide attempt in 2013, took pills with beer. She reports history of 1 inpatient psychiatric admission following suicide attempt at [facility].   Pt reports she is living alone.  She denies access to a firearm, although notes she does own a pellet pistol.  Pt denies she is in counseling. She reports she does receive psychiatric medication management. She is prescribed Trintellix,  Gabapentin, and Clonazepam. She is unsure of dosages.   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 She reports she taught courses related to [employer] as it applies to employment. She reports she is currently unemployed. She states she is on disability due to hips and depression.  Discussed with pt admission to facility based crisis. Pt in agreement. Pt reports several home medications, although cannot recall them all or their dosages. She denies home medications include medications for diabetes, such as insulin. Will have pharmacy verify home medications and order as appropriate. Pt verbalized understanding.  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  C-SSRS RISK CATEGORY No Risk No Risk No Risk        Total Time spent with patient: 45 minutes  Musculoskeletal  Strength & Muscle Tone: within normal limits Gait & Station:  ambulates with steady gait with cane Patient leans: N/A  Psychiatric Specialty Exam  Presentation General Appearance:  Appropriate for Environment; Casual; Fairly Groomed  Eye Contact: Fair  Speech:  Clear and Coherent; Slow  Speech Volume: Normal  Handedness: Right   Mood and Affect  Mood: Anxious  Affect: Flat   Thought Process  Thought Processes: Coherent  Descriptions of Associations:Tangential  Orientation:Full (Time, Place and  Person)  Thought Content:Logical  Diagnosis of Schizophrenia or Schizoaffective disorder in past: No data recorded  Hallucinations:Hallucinations: None  Ideas of Reference:None  Suicidal Thoughts:Suicidal Thoughts: No  Homicidal Thoughts:Homicidal Thoughts: No   Sensorium  Memory: Immediate Fair  Judgment: Intact  Insight: Present   Executive Functions  Concentration: Fair  Attention Span: Fair  Recall: [place] of Knowledge: Fair  Language: Fair   Psychomotor Activity  Psychomotor Activity: Psychomotor Activity: Normal (ambulates with cane with steady gait)   Assets  Assets: Communication Skills; Desire for Improvement; Financial Resources/Insurance; Housing; Resilience; Social Support   Sleep  Sleep: Sleep: Fair Number of Hours of Sleep: 0 (6 to 10 hours)   Nutritional Assessment (For OBS and FBC admissions only) Has the patient had a weight loss or gain of 10 pounds or more in the last 3 months?: Yes Has the patient had a decrease in food intake/or appetite?: Yes Does the patient have dental problems?: No Does the patient have eating habits or behaviors that may be indicators of an eating disorder including binging or inducing vomiting?: No Has the patient recently lost weight without trying?: 1 Has the patient been eating poorly because of a decreased appetite?: 1 Malnutrition Screening Tool Score: 2    Physical Exam Constitutional:      General: She is not in acute distress.    Appearance: Normal appearance. She is not ill-appearing, toxic-appearing or diaphoretic.  Eyes:     General: No scleral icterus. Cardiovascular:     Rate and Rhythm: Normal rate.  Pulmonary:     Effort: Pulmonary effort is normal. No respiratory distress.  Neurological:     Mental Status: She is alert and oriented to person, place, and time.  Psychiatric:        Attention and Perception: Perception normal.        Mood and Affect: Mood is anxious. Affect  is flat.        Speech: Speech is tangential.        Behavior: Behavior is slowed. Behavior is cooperative.    Review of Systems  Constitutional:  Negative for chills and fever.  HENT:  Positive for congestion.        Nasal drainage since February/March 2023  Respiratory:  Negative for shortness of breath.   Cardiovascular:  Negative for chest pain and palpitations.  Gastrointestinal:  Negative for abdominal pain.  Neurological:  Negative for headaches.  Psychiatric/Behavioral:  Positive for substance abuse.     Blood pressure 135/86, pulse 100, resp. rate 18, last menstrual period 09/20/2010, SpO2 98 %. There is no height or weight on file to calculate BMI.  Past Psychiatric History: Reported history of depression, cocaine abuse   Is the patient at risk to self? No  Has the patient been a risk to self in the past 6 months? No .    Has the patient been a risk to self within the distant past? Yes   Is the patient a risk to others? No   Has the patient been a risk to others in the past 6 months? No   Has the patient been a risk to others within the distant past? 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 TTE 10/2016: EF 50-55%, no RWMA, normal LV diastolic function, left atrium normal, RV systolic function normal, PASP normal   History of stress test    a. treadmill Myoview 11/2016: small defect of mild severity present in the apex location felt to be secondary to breast attenuation. EF 55-65%. Normal study   HTN (hypertension)    Orthostatic hypotension    Psoriasis     Past  Surgical History:  Procedure Laterality Date   BREAST RECONSTRUCTION  01/06/2012   Procedure: BREAST RECONSTRUCTION;  Surgeon: Crissie Reese, MD;  Location: [place];  Service: Plastics;  Laterality: Bilateral;  bilateral removal of tissue expanders, bilateral placement of implants--Breast   BREAST SURGERY  06/19/2010   exploration of right mastectomy site due to post-op bleeding   INSERTION OF TISSUE EXPANDER AFTER MASTECTOMY  06/19/2010   bilat. mastectomies with bilat. 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 smoker   Hypertension Maternal Grandfather    Diabetes Paternal Grandmother    Hypertension Paternal Grandmother    Kidney disease Paternal Grandmother        End stage renal dz - dialysis   Hyperlipidemia Paternal Grandmother    Breast cancer Paternal Grandmother    Alcohol abuse Father    Depression Father    Drug abuse Father 43       Died of drug overdose    Social History:  Social History   Socioeconomic History   Marital status: Single    Spouse name: Not on file   Number of children: Not on file   Years of education: 18   Highest education level: Not on file  Occupational History   Occupation: Scientist, research (medical) Relations    Comment: Ambulance person  Tobacco Use   Smoking status: Never   Smokeless tobacco: Never  Vaping Use   Vaping Use: Never used  Substance and Sexual Activity   Alcohol use: No    Comment: occasionally   Drug use: No   Sexual activity: Not Currently    Birth  control/protection: None  Other Topics Concern   Not on file  Social History Narrative   Kamile [place], [place]. She attended [employer] where she obtained her Bachelors of Science degree in Psychology. 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 She currently lives in [place]. She lives alone. Skyler enjoys attending live musical performances. 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  Social Determinants of Health   Financial Resource Strain: Not on file  Food Insecurity: Not on file  Transportation Needs: Not on file  Physical Activity: Not on file  Stress: Not on file  Social Connections: Not on file  Intimate Partner Violence: Not on file    SDOH:  SDOH Screenings   Alcohol Screen: Low Risk  (05/12/2018)  Depression (PHQ2-9): Medium Risk (11/11/2020)  Tobacco Use: Low Risk  (01/05/2022)    Last Labs:  Admission on 08/13/2022, Discharged on 08/13/2022  Component Date Value Ref Range Status   Sodium 08/13/2022 139  135 - 145 mmol/L Final   Potassium 08/13/2022 4.1  3.5 - 5.1 mmol/L Final   HEMOLYSIS AT THIS LEVEL MAY AFFECT RESULT   Chloride 08/13/2022 102  98 - 111 mmol/L Final   CO2 08/13/2022 24  22 - 32 mmol/L Final   Glucose, Bld 08/13/2022 91  70 - 99 mg/dL Final   Glucose reference range applies only to samples taken after fasting for at least 8 hours.   BUN 08/13/2022 11  6 - 20 mg/dL Final   Creatinine, Ser 08/13/2022 0.88  0.44 - 1.00 mg/dL Final   Calcium 08/13/2022 9.6  8.9 - 10.3 mg/dL Final   Total Protein 08/13/2022 8.3 (H)  6.5 - 8.1 g/dL Final   Albumin 08/13/2022 4.3  3.5 - 5.0 g/dL Final   AST 08/13/2022 47 (H)  15 - 41 U/L Final   ALT 08/13/2022 17  0 - 44 U/L Final   Alkaline Phosphatase 08/13/2022 62  38 - 126 U/L Final   Total Bilirubin 08/13/2022 0.7  0.3 - 1.2 mg/dL Final   GFR, Estimated 08/13/2022 >60  >60 mL/min Final   Comment:  (NOTE) Calculated using the CKD-EPI Creatinine Equation (2021)    Anion gap 08/13/2022 13  5 - 15 Final   Performed at [facility], [address] [place], [place] [address]   Alcohol, Ethyl (B) 08/13/2022 <10  <10 mg/dL Final   Comment: (NOTE) Lowest detectable limit for serum alcohol is 10 mg/dL.  For medical purposes only. Performed at [facility], [address] [place], [place] [address]    Salicylate Lvl 76/72/0947 13.6  7.0 - 30.0 mg/dL Final   Performed at [facility], [place], [place] [address]   Acetaminophen (Tylenol), Serum 08/13/2022 <10 (L)  10 - 30 ug/mL Final   Comment: (NOTE) Therapeutic concentrations vary significantly. A range of 10-30 ug/mL  may be an effective concentration for many patients. However, some  are best treated at concentrations outside of this range. Acetaminophen concentrations >150 ug/mL at 4 hours after ingestion  and >50 ug/mL at 12 hours after ingestion are often associated with  toxic reactions.  Performed at [facility], [address] [place], [place] [address]    WBC 08/13/2022 5.4  4.0 - 10.5 K/uL Final   RBC 08/13/2022 4.42  3.87 - 5.11 MIL/uL Final   Hemoglobin 08/13/2022 10.7 (L)  12.0 - 15.0 g/dL Final   HCT 08/13/2022 33.7 (L)  36.0 - 46.0 % Final   MCV 08/13/2022 76.2 (L)  80.0 - 100.0 fL Final   MCH 08/13/2022 24.2 (L)  26.0 - 34.0 pg Final   MCHC 08/13/2022 31.8  30.0 - 36.0 g/dL Final   RDW 08/13/2022 15.4  11.5 - 15.5 % Final   Platelets 08/13/2022 350  150 - 400 K/uL Final   nRBC 08/13/2022 0.0  0.0 - 0.2 % Final  Performed at [facility], [address] [place], [place] [address]   Tricyclic, Ur Screen 29/93/7169 NONE DETECTED  NONE DETECTED Final   Amphetamines, Ur Screen 08/13/2022 NONE DETECTED  NONE DETECTED Final   MDMA (Ecstasy)Ur Screen 08/13/2022 NONE DETECTED  NONE DETECTED Final   Cocaine Metabolite,Ur [place] 08/13/2022 POSITIVE (A)  NONE  DETECTED Final   Opiate, Ur Screen 08/13/2022 NONE DETECTED  NONE DETECTED Final   Phencyclidine (PCP) Ur S 08/13/2022 NONE DETECTED  NONE DETECTED Final   Cannabinoid 50 Ng, Ur [place] 08/13/2022 NONE DETECTED  NONE DETECTED Final   Barbiturates, Ur Screen 08/13/2022 NONE DETECTED  NONE DETECTED Final   Benzodiazepine, Ur Scrn 08/13/2022 NONE DETECTED  NONE DETECTED Final   Methadone Scn, Ur 08/13/2022 NONE DETECTED  NONE DETECTED Final   Comment: (NOTE) Tricyclics + metabolites, urine    Cutoff 1000 ng/mL Amphetamines + metabolites, urine  Cutoff 1000 ng/mL MDMA (Ecstasy), urine              Cutoff 500 ng/mL Cocaine Metabolite, urine          Cutoff 300 ng/mL Opiate + metabolites, urine        Cutoff 300 ng/mL Phencyclidine (PCP), urine         Cutoff 25 ng/mL Cannabinoid, urine                 Cutoff 50 ng/mL Barbiturates + metabolites, urine  Cutoff 200 ng/mL Benzodiazepine, urine              Cutoff 200 ng/mL Methadone, urine                   Cutoff 300 ng/mL  The urine drug screen provides only a preliminary, unconfirmed analytical test result and should not be used for non-medical purposes. Clinical consideration and professional judgment should be applied to any positive drug screen result due to possible interfering substances. A more specific alternate chemical method must be used in order to obtain a confirmed analytical result. Gas chromatography / mass spectrometry (GC/MS) is the preferred confirm                          atory method. Performed at [facility], [address] [place], [place] [address]     Allergies: Prochlorperazine, Methadone, Methadone hcl, Other, Propranolol, Tape, and Wound dressing adhesive  PTA Medications: (Not in a hospital admission)   Long Term Goals: Improvement in symptoms so as ready for discharge  Short Term Goals: Patient will verbalize feelings in meetings with treatment team members., Patient will attend at least of 50%  of the groups daily., Pt will complete the PHQ9 on admission, day 3 and discharge., Patient will participate in completing the [place], and Patient will take medications as prescribed daily.  Medical Decision Making  Pt is admitted to facility based crisis  Lab Orders         Resp panel by RT-PCR (RSV, Flu A&B, Covid) Anterior Nasal Swab         CBC with Differential/Platelet         Comprehensive metabolic panel         Hemoglobin A1c         Ethanol         Lipid panel         TSH         Urinalysis, Routine w reflex microscopic  POC urine preg, ED         POCT Urine Drug Screen - (I-Screen)     Meds ordered this encounter  Medications   acetaminophen (TYLENOL) tablet 650 mg   alum & mag hydroxide-simeth (MAALOX/MYLANTA) 200-200-20 MG/5ML suspension 30 mL   magnesium hydroxide (MILK OF MAGNESIA) suspension 30 mL   hydrOXYzine (ATARAX) tablet 25 mg   traZODone (DESYREL) tablet 50 mg    Recommendations  Based on my evaluation the patient does not appear to have an emergency medical condition.  Tharon Aquas, NP 09/01/22  5:47 PM

## 2022-09-01 NOTE — ED Notes (Signed)
Pt walked to [facility], refuses to sign admission paperwork, and is requesting to be discharged. Evette Georges, NP and Koren Shiver, RN/AC made aware.

## 2022-09-01 NOTE — ED Notes (Signed)
Pt asleep in bed. Respirations even and unlabored. Monitoring for safety. 

## 2022-09-01 NOTE — ED Triage Notes (Signed)
Pt presents to [facility] voluntarily accompanied by her aunt for substance use treatment. Pt states she used cocaine today, unknown amount, to "show you guys how I am on cocaine". Pt denies SI/HI and AVH.
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
## 2022-09-01 NOTE — ED Notes (Addendum)
Pt has a small open area to inside of her left lower leg that she states she is supposed to be taking antibiotics for. Evette Georges, NP notified.
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
## 2022-09-01 NOTE — ED Notes (Signed)
Pt admitted to [facility] for substance use. Pt A&O x4, irritable and guarded. Pt denies SI/HI/AVH. Pt tolerated admission process and skin assessment fair. Pt states she is only staying at [facility] for 1 night. Pt ambulated to unit with cane. Pt oriented to unit/staff. Pt declined offer for food/drink at this time. No signs of acute distress noted. Monitoring for safety.

## 2022-09-01 NOTE — ED Notes (Signed)
Evette Georges, NP to unit to assess pt's leg. No further orders received.

## 2022-09-01 NOTE — Progress Notes (Signed)
[facility] entered resources into AVS  [facility]

## 2022-09-02 ENCOUNTER — Encounter (HOSPITAL_COMMUNITY): Payer: Self-pay

## 2022-09-02 DIAGNOSIS — F1410 Cocaine abuse, uncomplicated: Secondary | ICD-10-CM | POA: Diagnosis not present

## 2022-09-02 LAB — HEMOGLOBIN A1C
Hgb A1c MFr Bld: 6.1 % — ABNORMAL HIGH (ref 4.8–5.6)
Mean Plasma Glucose: 128 mg/dL

## 2022-09-02 MED ORDER — GABAPENTIN 300 MG PO CAPS: 300.0000 mg | ORAL_CAPSULE | Freq: Four times a day (QID) | ORAL | Status: DC | PRN

## 2022-09-02 MED ORDER — VORTIOXETINE HBR 5 MG PO TABS
10.0000 mg | ORAL_TABLET | Freq: Every day | ORAL | Status: DC
  Administered 2022-09-02 – 2022-09-04 (×3): 10 mg via ORAL
  Filled 2022-09-02 (×3): qty 2

## 2022-09-02 MED ORDER — CLONAZEPAM 0.5 MG PO TABS
0.5000 mg | ORAL_TABLET | Freq: Two times a day (BID) | ORAL | Status: AC
  Administered 2022-09-03 (×2): 0.5 mg via ORAL
  Filled 2022-09-02 (×2): qty 1

## 2022-09-02 MED ORDER — ACYCLOVIR 200 MG PO CAPS
400.0000 mg | ORAL_CAPSULE | Freq: Three times a day (TID) | ORAL | Status: DC
  Administered 2022-09-02 – 2022-09-04 (×6): 400 mg via ORAL
  Filled 2022-09-02 (×9): qty 2

## 2022-09-02 MED ORDER — LOPERAMIDE HCL 2 MG PO CAPS: 2.0000 mg | ORAL_CAPSULE | ORAL | Status: DC | PRN

## 2022-09-02 MED ORDER — TRIAMCINOLONE ACETONIDE 0.5 % EX CREA
1.0000 | TOPICAL_CREAM | Freq: Two times a day (BID) | CUTANEOUS | Status: DC
  Administered 2022-09-02 – 2022-09-04 (×5): 1 via TOPICAL
  Filled 2022-09-02: qty 15

## 2022-09-02 MED ORDER — METHOCARBAMOL 500 MG PO TABS: 500.0000 mg | ORAL_TABLET | Freq: Three times a day (TID) | ORAL | Status: DC | PRN

## 2022-09-02 MED ORDER — CLONIDINE HCL 0.1 MG PO TABS: 0.1000 mg | ORAL_TABLET | Freq: Three times a day (TID) | ORAL | Status: DC | PRN

## 2022-09-02 MED ORDER — CLONAZEPAM 1 MG PO TABS
1.0000 mg | ORAL_TABLET | Freq: Two times a day (BID) | ORAL | Status: AC
  Administered 2022-09-02 (×2): 1 mg via ORAL
  Filled 2022-09-02 (×2): qty 1

## 2022-09-02 MED ORDER — DICYCLOMINE HCL 20 MG PO TABS: 20.0000 mg | ORAL_TABLET | Freq: Four times a day (QID) | ORAL | Status: DC | PRN

## 2022-09-02 MED ORDER — AMLODIPINE BESYLATE 10 MG PO TABS
10.0000 mg | ORAL_TABLET | Freq: Every day | ORAL | Status: DC
  Administered 2022-09-02 – 2022-09-04 (×3): 10 mg via ORAL
  Filled 2022-09-02 (×3): qty 1

## 2022-09-02 MED ORDER — ONDANSETRON 4 MG PO TBDP: 4.0000 mg | ORAL_TABLET | Freq: Four times a day (QID) | ORAL | Status: DC | PRN

## 2022-09-02 MED ORDER — CLONAZEPAM 0.5 MG PO TABS
0.5000 mg | ORAL_TABLET | Freq: Every day | ORAL | Status: AC
  Administered 2022-09-04: 0.5 mg via ORAL
  Filled 2022-09-02: qty 1

## 2022-09-02 MED ORDER — CLOBETASOL PROPIONATE 0.05 % EX OINT
1.0000 | TOPICAL_OINTMENT | Freq: Two times a day (BID) | CUTANEOUS | Status: DC
  Administered 2022-09-02 – 2022-09-04 (×5): 1 via TOPICAL
  Filled 2022-09-02: qty 15

## 2022-09-02 NOTE — ED Notes (Signed)
Pt asleep in bed. Respirations even and unlabored. Monitoring for safety. 

## 2022-09-02 NOTE — Discharge Planning (Signed)
LCSW contacted [facility] in [place] to explore options for SAIOP. LCSW spoke with Aisha regarding making a hospital follow up appt. Patient demographics provided and appointment for SAIOP has been arranged for Wednesday September 08, 2022 at 10:00am. [name] is offered M,W,F from 9am- 12pm for 8-12 weeks. Patient may stepdown to one day a week after completion but class will still be held from 9-12pm. Patient and Counseling Liaison will be provided an update. No other needs to report at this time.   Lucius Conn, LCSW Clinical Social Worker [facility] Ph: [phone]

## 2022-09-02 NOTE — Progress Notes (Signed)
Patient attending group by chaplain.

## 2022-09-02 NOTE — ED Notes (Signed)
Patient remains asleep in bed at this time.  No distress or complaint.

## 2022-09-02 NOTE — ED Provider Notes (Addendum)
Behavioral Health Progress Note  Date and Time: 09/02/2022 12:20 PM Name: Penny Kim MRN:  [id number]  Subjective:  Penny Kim is a 51 year old female with psychiatric history depression and anxiety who presented to the [facility] for detox from cocaine use and assistance with substance use treatment.  On assessment today, patient reports that she came here with her aunt, but after acceptance to [facility], felt abandoned by her aunt. She reports that she has minimal supports with her family. Patient believes that she is not yet ready for inpatient substance use treatment at this time. When explored, she reports that she believes that she needs better support to complete a treatment program. We then discussed how sometimes supports are professional and programs can provide the community needed to aid in recovery. She expresses that she would like to do IOP at this time. We discussed risks and benefits of IOP, and she voiced that she would like to give it a try, and then seek residential if she fails the program. Her concerns are addressed and answered, and she is appreciative. She said that throughout the day today she would consider residential, and would let our team know her thoughts. Patient lives alone and uses cocaine approximately 3 days/week in order to have enough energy to clean. She reports an increase in cocaine use this year, but she has been using intermittently since 2018. She reports that overall, she does not feel completely safe to return home due to lack of support and gait instability with her hip. She denies SI, HI, and AVH. Patient endorses chills, nasal congestion and rhinorrhea, and abdominal pain but denies further withdrawal symptoms.    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 TTE 10/2016: EF 50-55%, no RWMA, normal LV diastolic function, left atrium normal, RV systolic function normal, PASP normal   History of stress test    a. treadmill Myoview 11/2016: small defect of mild severity present in the apex location felt to be secondary to breast attenuation. EF 55-65%. Normal study   HTN (hypertension)    Orthostatic hypotension    Psoriasis     Past Surgical History:  Procedure Laterality Date   BREAST RECONSTRUCTION  01/06/2012   Procedure: BREAST RECONSTRUCTION;  Surgeon: Crissie Reese, MD;  Location: [facility];  Service: Plastics;  Laterality: Bilateral;  bilateral removal of tissue expanders, bilateral placement of implants--Breast   BREAST SURGERY  06/19/2010   exploration of right mastectomy site due to post-op bleeding   INSERTION OF TISSUE EXPANDER AFTER MASTECTOMY  06/19/2010   bilat. mastectomies with bilat. 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   Hypertension Maternal Grandfather    Diabetes Paternal Grandmother    Hypertension Paternal Grandmother    Kidney disease Paternal Grandmother        End stage renal dz - dialysis   Hyperlipidemia Paternal Grandmother    Breast cancer  Paternal Grandmother    Alcohol abuse Father    Depression Father    Drug abuse Father 57       Died of drug overdose   Family Psychiatric  History: See H&P Social History:  Social History   Substance and Sexual Activity  Alcohol Use No   Comment: occasionally     Social History   Substance and Sexual Activity  Drug Use No    Social History   Socioeconomic History   Marital status: Single    Spouse name: Not on file   Number of children: Not on file   Years of education: 18   Highest education level: Not on file  Occupational History   Occupation: Scientist, research (medical) Relations    Comment: Ambulance person  Tobacco Use   Smoking status: Never   Smokeless tobacco: Never  Vaping Use   Vaping Use: Never used  Substance and Sexual Activity   Alcohol use: No    Comment: occasionally   Drug use: No   Sexual activity: Not Currently    Birth control/protection: None  Other Topics Concern   Not on file  Social History Narrative   Penny Kim [place], [place]. She attended [employer] where she obtained her Bachelors of Science degree in Psychology. She then obtained her [facility] [employer] in Museum/gallery exhibitions officer from [employer]. She currently lives in [place]. She lives alone. Penny Kim enjoys attending live musical performances. 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    Social Determinants of Health   Financial Resource Strain: Not on file  Food Insecurity: Not on file  Transportation Needs: Not on file  Physical Activity: Not on file  Stress: Not on file  Social Connections: Not on file   SDOH:  SDOH Screenings   Alcohol Screen: Low Risk  (05/12/2018)  Depression (PHQ2-9): High Risk (09/01/2022)  Tobacco Use: Low Risk  (01/05/2022)   Additional Social History:    Pain Medications: SEE MAR Prescriptions: SEE MAR Over the Counter: SEE MAR History of alcohol / drug use?: Yes Longest  period of sobriety (when/how long): STARTED USING COCAINE IN MARCH Negative Consequences of Use:  (n/a) Withdrawal Symptoms: None Name of Substance 1: COCAINE 1 - Age of First Use: 46 1 - Amount (size/oz): UNKNOWN 1 - Frequency: 2-3 TIMES A WEEK 1 - Duration: ONGOING 1 - Last Use / Amount: TODAY/UNKNOWN 1 - Method of Aquiring: UNKNOWN 1- Route of Use: SNORTING                  Sleep: Fair  Appetite:  Fair  Current Medications:  Current Facility-Administered Medications  Medication Dose Route Frequency Provider Last Rate Last Admin   acetaminophen (TYLENOL) tablet 650 mg  650 mg Oral Q6H PRN Tharon Aquas, NP       alum & mag hydroxide-simeth (MAALOX/MYLANTA) 200-200-20 MG/5ML suspension 30 mL  30 mL Oral Q4H PRN Tharon Aquas, NP       hydrOXYzine (ATARAX) tablet 25 mg  25 mg Oral TID PRN Tharon Aquas, NP       magnesium hydroxide (MILK OF MAGNESIA) suspension 30 mL  30 mL Oral Daily PRN Tharon Aquas, NP       traZODone (DESYREL) tablet 50 mg  50 mg Oral QHS PRN Tharon Aquas, NP       Current Outpatient Medications  Medication Sig Dispense Refill   acetaminophen (TYLENOL) 500 MG tablet Take 1,000 mg by mouth every 6 (six) hours as needed (For hip pain or fever.).     acyclovir (ZOVIRAX) 400 MG tablet Take 400 mg by mouth 3 (three) times daily.     amLODipine (NORVASC) 10 MG tablet Take 1 tablet (10 mg total) by mouth daily. Needs to keep appt for further refills 15 tablet 0   clobetasol ointment (TEMOVATE) 2.03 % Apply 1 Application topically 2 (two) times daily. Apply to affected area.     clonazePAM (KLONOPIN) 1 MG tablet Take 1 mg by mouth 4 (four) times daily as needed for anxiety.     doxycycline (MONODOX) 100 MG capsule Take 100 mg by mouth 2 (two) times daily. Take for 7 days starting on 08/31/22.     gabapentin (NEURONTIN) 300 MG capsule Take 300 mg by mouth 4 (four) times daily as needed (For pain).     mupirocin ointment (BACTROBAN)  2 % Apply 1 Application topically 3 (three) times daily. Apply to affected area.     triamcinolone cream (KENALOG) 0.5 % Apply 1 Application topically 2 (two) times daily. Apply sparingly to affected area.     TRINTELLIX 10 MG TABS tablet Take 10 mg by mouth daily.      Labs  Lab Results:  Admission on 09/01/2022  Component Date Value Ref Range Status   SARS Coronavirus 2 by RT PCR 09/01/2022 NEGATIVE  NEGATIVE Final   Comment: (NOTE) SARS-CoV-2 target nucleic acids are NOT DETECTED.  The SARS-CoV-2 RNA is generally detectable in upper respiratory specimens during the acute phase of infection. The lowest concentration of SARS-CoV-2 viral copies this assay can detect is 138 copies/mL. A negative result does not preclude SARS-Cov-2 infection and should not be used as the sole basis for treatment or other patient management decisions. A negative result may occur with  improper specimen collection/handling, submission of specimen other than nasopharyngeal swab, presence of viral mutation(s) within the areas targeted by this assay, and inadequate number of viral copies(<138 copies/mL). A negative result must be combined with clinical observations, patient history, and epidemiological information. The expected result is Negative.  Fact Sheet for Patients:  EntrepreneurPulse.com.au  Fact Sheet for Healthcare Providers:  IncredibleEmployment.be  This test is no                          t yet approved or cleared by the [place] FDA and  has been authorized for detection and/or diagnosis of SARS-CoV-2 by FDA under an Emergency Use Authorization (EUA). This EUA will remain  in effect (meaning this test can be used) for the duration of the COVID-19 declaration under Section 564(b)(1) of the Act, 21 U.S.C.section 360bbb-3(b)(1), unless the authorization is terminated  or revoked sooner.       Influenza A by PCR 09/01/2022 NEGATIVE  NEGATIVE Final    Influenza B by PCR 09/01/2022 NEGATIVE  NEGATIVE Final   Comment: (  NOTE) The Xpert Xpress SARS-CoV-2/FLU/RSV plus assay is intended as an aid in the diagnosis of influenza from Nasopharyngeal swab specimens and should not be used as a sole basis for treatment. Nasal washings and aspirates are unacceptable for Xpert Xpress SARS-CoV-2/FLU/RSV testing.  Fact Sheet for Patients: EntrepreneurPulse.com.au  Fact Sheet for Healthcare Providers: IncredibleEmployment.be  This test is not yet approved or cleared by the [place] FDA and has been authorized for detection and/or diagnosis of SARS-CoV-2 by FDA under an Emergency Use Authorization (EUA). This EUA will remain in effect (meaning this test can be used) for the duration of the COVID-19 declaration under Section 564(b)(1) of the Act, 21 U.S.C. section 360bbb-3(b)(1), unless the authorization is terminated or revoked.     Resp Syncytial Virus by PCR 09/01/2022 NEGATIVE  NEGATIVE Final   Comment: (NOTE) Fact Sheet for Patients: EntrepreneurPulse.com.au  Fact Sheet for Healthcare Providers: IncredibleEmployment.be  This test is not yet approved or cleared by the [place] FDA and has been authorized for detection and/or diagnosis of SARS-CoV-2 by FDA under an Emergency Use Authorization (EUA). This EUA will remain in effect (meaning this test can be used) for the duration of the COVID-19 declaration under Section 564(b)(1) of the Act, 21 U.S.C. section 360bbb-3(b)(1), unless the authorization is terminated or revoked.  Performed at [facility], [place] [address] [place], [place] [address]    WBC 09/01/2022 4.5  4.0 - 10.5 K/uL Final   RBC 09/01/2022 4.31  3.87 - 5.11 MIL/uL Final   Hemoglobin 09/01/2022 11.3 (L)  12.0 - 15.0 g/dL Final   HCT 09/01/2022 33.7 (L)  36.0 - 46.0 % Final   MCV 09/01/2022 78.2 (L)  80.0 - 100.0 fL Final   MCH  09/01/2022 26.2  26.0 - 34.0 pg Final   MCHC 09/01/2022 33.5  30.0 - 36.0 g/dL Final   RDW 09/01/2022 15.9 (H)  11.5 - 15.5 % Final   Platelets 09/01/2022 341  150 - 400 K/uL Final   nRBC 09/01/2022 0.0  0.0 - 0.2 % Final   Neutrophils Relative % 09/01/2022 63  % Final   Neutro Abs 09/01/2022 2.8  1.7 - 7.7 K/uL Final   Lymphocytes Relative 09/01/2022 28  % Final   Lymphs Abs 09/01/2022 1.3  0.7 - 4.0 K/uL Final   Monocytes Relative 09/01/2022 7  % Final   Monocytes Absolute 09/01/2022 0.3  0.1 - 1.0 K/uL Final   Eosinophils Relative 09/01/2022 1  % Final   Eosinophils Absolute 09/01/2022 0.1  0.0 - 0.5 K/uL Final   Basophils Relative 09/01/2022 1  % Final   Basophils Absolute 09/01/2022 0.1  0.0 - 0.1 K/uL Final   Immature Granulocytes 09/01/2022 0  % Final   Abs Immature Granulocytes 09/01/2022 0.00  0.00 - 0.07 K/uL Final   Performed at [facility], [place] [address] [place], [place] [address]   Sodium 09/01/2022 140  135 - 145 mmol/L Final   Potassium 09/01/2022 4.0  3.5 - 5.1 mmol/L Final   Chloride 09/01/2022 102  98 - 111 mmol/L Final   CO2 09/01/2022 28  22 - 32 mmol/L Final   Glucose, Bld 09/01/2022 109 (H)  70 - 99 mg/dL Final   Glucose reference range applies only to samples taken after fasting for at least 8 hours.   BUN 09/01/2022 6  6 - 20 mg/dL Final   Creatinine, Ser 09/01/2022 0.82  0.44 - 1.00 mg/dL Final   Calcium 09/01/2022 9.8  8.9 -  10.3 mg/dL Final   Total Protein 09/01/2022 7.7  6.5 - 8.1 g/dL Final   Albumin 09/01/2022 4.1  3.5 - 5.0 g/dL Final   AST 09/01/2022 23  15 - 41 U/L Final   ALT 09/01/2022 17  0 - 44 U/L Final   Alkaline Phosphatase 09/01/2022 58  38 - 126 U/L Final   Total Bilirubin 09/01/2022 0.2 (L)  0.3 - 1.2 mg/dL Final   GFR, Estimated 09/01/2022 >60  >60 mL/min Final   Comment: (NOTE) Calculated using the CKD-EPI Creatinine Equation (2021)    Anion gap 09/01/2022 10  5 - 15 Final   Performed at [facility], [place] [address] [place], [place] [address]   Hgb A1c MFr Bld 09/01/2022 6.1 (H)  4.8 - 5.6 % Final   Comment: (NOTE)         Prediabetes: 5.7 - 6.4         Diabetes: >6.4         Glycemic control for adults with diabetes: <7.0    Mean Plasma Glucose 09/01/2022 128  mg/dL Final   Comment: (NOTE) Performed At: [facility] [place], [place] [id number] Rush Farmer MD HF:[id number]    Alcohol, Ethyl (B) 09/01/2022 <10  <10 mg/dL Final   Comment: (NOTE) Lowest detectable limit for serum alcohol is 10 mg/dL.  For medical purposes only. Performed at [facility], [place] [address], [place], [place] [address]    Cholesterol 09/01/2022 217 (H)  0 - 200 mg/dL Final   Triglycerides 09/01/2022 77  <150 mg/dL Final   HDL 09/01/2022 77  >40 mg/dL Final   Total CHOL/HDL Ratio 09/01/2022 2.8  RATIO Final   VLDL 09/01/2022 15  0 - 40 mg/dL Final   LDL Cholesterol 09/01/2022 125 (H)  0 - 99 mg/dL Final   Comment:        Total Cholesterol/HDL:CHD Risk Coronary Heart Disease Risk Table                     Men   Women  1/2 Average Risk   3.4   3.3  Average Risk       5.0   4.4  2 X Average Risk   9.6   7.1  3 X Average Risk  23.4   11.0        Use the calculated Patient Ratio above and the CHD Risk Table to determine the patient's CHD Risk.        ATP III CLASSIFICATION (LDL):  <100     mg/dL   Optimal  100-129  mg/dL   Near or Above                    Optimal  130-159  mg/dL   Borderline  160-189  mg/dL   High  >190     mg/dL   Very High Performed at [place] [address], [place], [place] [address]    TSH 09/01/2022 1.372  0.350 - 4.500 uIU/mL Final   Comment: Performed by a 3rd Generation assay with a functional sensitivity of <=0.01 uIU/mL. Performed at [facility], [place] [address], [place], [place] [address]    Color, Urine 09/01/2022 YELLOW  YELLOW Final   APPearance 09/01/2022 HAZY (A)  CLEAR Final   Specific Gravity, Urine 09/01/2022 1.025   1.005 - 1.030 Final   pH 09/01/2022 5.0  5.0 - 8.0 Final   Glucose,  UA 09/01/2022 NEGATIVE  NEGATIVE mg/dL Final   Hgb urine dipstick 09/01/2022 NEGATIVE  NEGATIVE Final   Bilirubin Urine 09/01/2022 NEGATIVE  NEGATIVE Final   Ketones, ur 09/01/2022 NEGATIVE  NEGATIVE mg/dL Final   Protein, ur 09/01/2022 100 (A)  NEGATIVE mg/dL Final   Nitrite 09/01/2022 NEGATIVE  NEGATIVE Final   Leukocytes,Ua 09/01/2022 NEGATIVE  NEGATIVE Final   RBC / HPF 09/01/2022 0-5  0 - 5 RBC/hpf Final   WBC, UA 09/01/2022 0-5  0 - 5 WBC/hpf Final   Bacteria, UA 09/01/2022 NONE SEEN  NONE SEEN Final   Squamous Epithelial / LPF 09/01/2022 0-5  0 - 5 Final   Mucus 09/01/2022 PRESENT   Final   Performed at [facility], [place] [address], [place], [place] [address]   Preg Test, Ur 09/01/2022 Negative  Negative Final   POC Amphetamine UR 09/01/2022 None Detected  NONE DETECTED (Cut Off Level 1000 ng/mL) Final   POC Secobarbital (BAR) 09/01/2022 None Detected  NONE DETECTED (Cut Off Level 300 ng/mL) Final   POC Buprenorphine (BUP) 09/01/2022 None Detected  NONE DETECTED (Cut Off Level 10 ng/mL) Final   POC Oxazepam (BZO) 09/01/2022 Positive (A)  NONE DETECTED (Cut Off Level 300 ng/mL) Final   POC Cocaine UR 09/01/2022 Positive (A)  NONE DETECTED (Cut Off Level 300 ng/mL) Final   POC Methamphetamine UR 09/01/2022 None Detected  NONE DETECTED (Cut Off Level 1000 ng/mL) Final   POC Morphine 09/01/2022 None Detected  NONE DETECTED (Cut Off Level 300 ng/mL) Final   POC Methadone UR 09/01/2022 None Detected  NONE DETECTED (Cut Off Level 300 ng/mL) Final   POC Oxycodone UR 09/01/2022 None Detected  NONE DETECTED (Cut Off Level 100 ng/mL) Final   POC Marijuana UR 09/01/2022 None Detected  NONE DETECTED (Cut Off Level 50 ng/mL) Final  Admission on 08/13/2022, Discharged on 08/13/2022  Component Date Value Ref Range Status   Sodium 08/13/2022 139  135 - 145 mmol/L Final   Potassium 08/13/2022 4.1  3.5 - 5.1 mmol/L Final    HEMOLYSIS AT THIS LEVEL MAY AFFECT RESULT   Chloride 08/13/2022 102  98 - 111 mmol/L Final   CO2 08/13/2022 24  22 - 32 mmol/L Final   Glucose, Bld 08/13/2022 91  70 - 99 mg/dL Final   Glucose reference range applies only to samples taken after fasting for at least 8 hours.   BUN 08/13/2022 11  6 - 20 mg/dL Final   Creatinine, Ser 08/13/2022 0.88  0.44 - 1.00 mg/dL Final   Calcium 08/13/2022 9.6  8.9 - 10.3 mg/dL Final   Total Protein 08/13/2022 8.3 (H)  6.5 - 8.1 g/dL Final   Albumin 08/13/2022 4.3  3.5 - 5.0 g/dL Final   AST 08/13/2022 47 (H)  15 - 41 U/L Final   ALT 08/13/2022 17  0 - 44 U/L Final   Alkaline Phosphatase 08/13/2022 62  38 - 126 U/L Final   Total Bilirubin 08/13/2022 0.7  0.3 - 1.2 mg/dL Final   GFR, Estimated 08/13/2022 >60  >60 mL/min Final   Comment: (NOTE) Calculated using the CKD-EPI Creatinine Equation (2021)    Anion gap 08/13/2022 13  5 - 15 Final   Performed at [facility], [address] [place], [place] [address]   Alcohol, Ethyl (B) 08/13/2022 <10  <10 mg/dL Final   Comment: (NOTE) Lowest detectable limit for serum alcohol is 10 mg/dL.  For medical purposes only. Performed at [facility], [address] [place], [place] [address]    Salicylate Lvl 40/04/6760 13.6  7.0 - 30.0 mg/dL Final   Performed at [facility], [place], [place] [address]   Acetaminophen (Tylenol), Serum 08/13/2022 <10 (L)  10 - 30 ug/mL Final   Comment: (NOTE) Therapeutic concentrations vary significantly. A range of 10-30 ug/mL  may be an effective concentration for many patients. However, some  are best treated at concentrations outside of this range. Acetaminophen concentrations >150 ug/mL at 4 hours after ingestion  and >50 ug/mL at 12 hours after ingestion are often associated with  toxic reactions.  Performed at [facility], [address] [place], [place] [address]    WBC 08/13/2022 5.4  4.0 - 10.5 K/uL  Final   RBC 08/13/2022 4.42  3.87 - 5.11 MIL/uL Final   Hemoglobin 08/13/2022 10.7 (L)  12.0 - 15.0 g/dL Final   HCT 08/13/2022 33.7 (L)  36.0 - 46.0 % Final   MCV 08/13/2022 76.2 (L)  80.0 - 100.0 fL Final   MCH 08/13/2022 24.2 (L)  26.0 - 34.0 pg Final   MCHC 08/13/2022 31.8  30.0 - 36.0 g/dL Final   RDW 08/13/2022 15.4  11.5 - 15.5 % Final   Platelets 08/13/2022 350  150 - 400 K/uL Final   nRBC 08/13/2022 0.0  0.0 - 0.2 % Final   Performed at [facility], [address] [place], [place] [address]   Tricyclic, Ur Screen 98/33/8250 NONE DETECTED  NONE DETECTED Final   Amphetamines, Ur Screen 08/13/2022 NONE DETECTED  NONE DETECTED Final   MDMA (Ecstasy)Ur Screen 08/13/2022 NONE DETECTED  NONE DETECTED Final   Cocaine Metabolite,Ur [place] 08/13/2022 POSITIVE (A)  NONE DETECTED Final   Opiate, Ur Screen 08/13/2022 NONE DETECTED  NONE DETECTED Final   Phencyclidine (PCP) Ur S 08/13/2022 NONE DETECTED  NONE DETECTED Final   Cannabinoid 50 Ng, Ur [place] 08/13/2022 NONE DETECTED  NONE DETECTED Final   Barbiturates, Ur Screen 08/13/2022 NONE DETECTED  NONE DETECTED Final   Benzodiazepine, Ur Scrn 08/13/2022 NONE DETECTED  NONE DETECTED Final   Methadone Scn, Ur 08/13/2022 NONE DETECTED  NONE DETECTED Final   Comment: (NOTE) Tricyclics + metabolites, urine    Cutoff 1000 ng/mL Amphetamines + metabolites, urine  Cutoff 1000 ng/mL MDMA (Ecstasy), urine              Cutoff 500 ng/mL Cocaine Metabolite, urine          Cutoff 300 ng/mL Opiate + metabolites, urine        Cutoff 300 ng/mL Phencyclidine (PCP), urine         Cutoff 25 ng/mL Cannabinoid, urine                 Cutoff 50 ng/mL Barbiturates + metabolites, urine  Cutoff 200 ng/mL Benzodiazepine, urine              Cutoff 200 ng/mL Methadone, urine                   Cutoff 300 ng/mL  The urine drug screen provides only a preliminary, unconfirmed analytical test result and should not be used for non-medical purposes. Clinical  consideration and professional judgment should be applied to any positive drug screen result due to possible interfering substances. A more specific alternate chemical method must be used in order to obtain a confirmed analytical result. Gas chromatography / mass spectrometry (GC/MS) is the preferred confirm  atory method. Performed at [facility], [address] [place], [place] [address]     Blood Alcohol level:  Lab Results  Component Value Date   [facility] <10 09/01/2022   ETH <10 54/05/8118    Metabolic Disorder Labs: Lab Results  Component Value Date   HGBA1C 6.1 (H) 09/01/2022   MPG 128 09/01/2022   No results found for: "PROLACTIN" Lab Results  Component Value Date   CHOL 217 (H) 09/01/2022   TRIG 77 09/01/2022   HDL 77 09/01/2022   CHOLHDL 2.8 09/01/2022   VLDL 15 09/01/2022   LDLCALC 125 (H) 09/01/2022   LDLCALC 120 (H) 02/06/2021    Therapeutic Lab Levels: No results found for: "LITHIUM" No results found for: "VALPROATE" No results found for: "CBMZ"  Physical Findings   PHQ2-9    Flowsheet Row ED from 09/01/2022 in [facility] Counselor from 11/11/2020 in [place] Office Visit from 12/30/2017 in [place] Visit from 09/22/2017 in [place] Visit from 10/19/2016 in [place]  PHQ-2 Total Score '5 5 3 6 '$ 0  PHQ-9 Total Score '12 20 19 23 '$ --      Flowsheet Row ED from 09/01/2022 in [facility] ED from 08/13/2022 in [place] ED to Hosp-Admission (Discharged) from 01/02/2022 in [facility] PCU  C-SSRS RISK CATEGORY No Risk No Risk No Risk        Musculoskeletal  Strength & Muscle Tone: within normal limits and patient able to pull self up to a seated position from lying down without issue Gait & Station:  unsteady, due to hip Patient leans:  Unobserved as patient was lying in bed  Psychiatric Specialty Exam  Presentation  General Appearance:  Appropriate for Environment; Casual; Fairly Groomed  Eye Contact: Fair  Speech: Clear and Coherent; Slow  Speech Volume: Normal  Handedness: Right   Mood and Affect  Mood: Anxious  Affect: Flat   Thought Process  Thought Processes: Coherent  Descriptions of Associations:Tangential  Orientation:Full (Time, Place and Person)  Thought Content:Logical  Diagnosis of Schizophrenia or Schizoaffective disorder in past: No    Hallucinations:Hallucinations: None  Ideas of Reference:None  Suicidal Thoughts:Suicidal Thoughts: No  Homicidal Thoughts:Homicidal Thoughts: No   Sensorium  Memory: Immediate Fair  Judgment: Intact  Insight: Present   Executive Functions  Concentration: Fair  Attention Span: Fair  Recall: Pearson of Knowledge: Fair  Language: Fair   Psychomotor Activity  Psychomotor Activity: Psychomotor Activity: Normal (ambulates with cane with steady gait)   Assets  Assets: Communication Skills; Desire for Improvement; Financial Resources/Insurance; Housing; Resilience; Social Support   Sleep  Sleep: Sleep: Fair Number of Hours of Sleep: 0 (6 to 10 hours)   Nutritional Assessment (For OBS and FBC admissions only) Has the patient had a weight loss or gain of 10 pounds or more in the last 3 months?: Yes Has the patient had a decrease in food intake/or appetite?: Yes Does the patient have dental problems?: No Does the patient have eating habits or behaviors that may be indicators of an eating disorder including binging or inducing vomiting?: No Has the patient recently lost weight without trying?: 1 Has the patient been eating poorly because of a decreased appetite?: 1 Malnutrition Screening Tool Score: 2    Physical Exam  Physical Exam Vitals reviewed.  Constitutional:       General: She is not in acute  distress. HENT:     Head: Normocephalic and atraumatic.     Nose: Congestion and rhinorrhea present.     Mouth/Throat:     Mouth: Mucous membranes are moist.     Pharynx: Oropharynx is clear.  Pulmonary:     Effort: Pulmonary effort is normal.  Skin:    General: Skin is warm and dry.  Neurological:     General: No focal deficit present.     Mental Status: She is alert and oriented to person, place, and time.    Review of Systems  Constitutional:  Positive for chills. Negative for diaphoresis and malaise/fatigue.  Cardiovascular:  Negative for chest pain and palpitations.  Gastrointestinal:  Positive for abdominal pain. Negative for diarrhea, nausea and vomiting.  Musculoskeletal:  Negative for myalgias.       High fall risk  Neurological:  Negative for dizziness, seizures and headaches.   Blood pressure (!) 121/58, pulse 98, temperature 98.7 F (37.1 C), temperature source Tympanic, resp. rate 16, last menstrual period 09/20/2010, SpO2 98 %. There is no height or weight on file to calculate BMI.  Treatment Plan Summary: Daily contact with patient to assess and evaluate symptoms and progress in treatment and Medication management FLO BERROA is a 51 year old female with psychiatric history depression and anxiety who presented to the [facility] for detox from cocaine use and assistance with substance use treatment.  At this time, patient reports that she would like to change her cocaine use, but she is not yet committed to seeking residential treatment at this time.  Patient voices that she would like to attempt to discontinue use via intensive outpatient.  She is reminded that relapse is a part of recovery, and we are here to support her in the case that she would need to return, and she is appreciative of that reminder.  Home medications resumed (with changes): - Trintellix 10 mg daily - Gabapentin 300 mg 4 times daily as needed pain - Klonopin taper with  1 mg twice daily x 1 day, followed by 0.5 mg twice daily x 1 day, followed by 0.5 mg x 1 day, then discontinue. -Acyclovir 400 mg 3 times daily -Norvasc 10 mg daily -Triamcinolone cream twice daily - Clobetasol ointment twice daily  #Cocaine use disorder #Benzodiazepine use, prescribed -Start CIWA and COWS monitoring -0.1 mg Clonidine as needed for SBP greater than 160 or DBP greater than 90 due to patient currently on antihypertensive therapy -Continue Bentyl 20 mg every 6H PRN for  spasm, abdominal cramping x5 days -Continue Robaxin 500 mg every 8 hours as needed for muscle spasm x5 days -Continue naproxen 500 mg twice daily as needed for aching, pain or discomfort x5 days. -Continue Zofran 4 mg every 6 hours as needed for nausea or vomiting. -Continue Imodium 2 to 4 mg as needed for diarrhea or loose stools for 72 hours.  -Continue hydroxyzine 25 mg every 6 hours as needed for anxiety -Continue thiamine 100 mg daily. -Continue multivitamin with minerals daily.                                                    Dispo: Patient accepted to [facility] in [place], [place] on Wednesday at [place], [place] 09/02/2022 12:20 PM

## 2022-09-02 NOTE — ED Notes (Signed)
Pt is in the bed sleeping. Respirations are even and unlabored. No acute distress noted. Will continue to monitor for safety. 

## 2022-09-02 NOTE — ED Notes (Signed)
Pt refused AA

## 2022-09-02 NOTE — ED Notes (Signed)
Pt is in the bed awake. Respirations are even and unlabored. No acute distress noted. Will continue to monitor for safety. 

## 2022-09-02 NOTE — Tx Team (Signed)
Pt presented voluntarily to [facility] behavioral health for walk-in assessment accompanied by her aunt. Patient reports presenting due to "cocaine". Patient reports she has been addicted to powder cocaine since March of this year. Patient reports she usually use cocaine when she needs to "get up and do something". Patient reports that she thinks the cocaine she has been using is "laced" with something due to feeling like she cannot stop using, feeling fearful and paranoid. Patient fears that she will die and reports her anxiety has increased. Pt reports periodic use of cocaine which has been increasing since February/March of this year. She reports snorting cocaine, at least 2 times a week. She reports use of more than 2 lines per occasion. She reports use of 1g of cocaine/day. She reports last use was prior to arrival at this facility because she wanted to "show you guys how I am on cocaine". Pt reports she is living alone. Patient reports feeling unsafe due to the neighborhood, and reports "drugs are everywhere". She denies access to a firearm, although notes she does own a pellet pistol. She reports history of 1 suicide attempt in 2013, took pills with beer. She reports history of 1 inpatient psychiatric admission following suicide attempt at [facility]. Patient informed this Clinician that she is being seen monthly by MD Delight Stare at the [facility] and a Counseling Liaison [place] for therapy. Patient reports she is disabled and receives $1400 a month. Patient reports frustration with the fact that she has drained her savings account of $13,000 due to bills and substance use. Patient reports no interest in residential placement at this time. Patient reports she was only reporting interest because she was pressured by family to get help. Patient reports her current goal is to seek intensive outpatient treatment for substance use. Patient reports she resides in [facility] and reports  she would prefer services in that area. Patient reports she is not opposed to receiving IOP services in [place] if there is no other option. Patient aware that LCSW will follow up to explore outpatient agencies in [facility] that can provide the services and will follow up to provide updates as received. No other needs were reported at this time.   Lucius Conn, LCSW Clinical Social Worker [facility] Ph: [phone]

## 2022-09-02 NOTE — Discharge Instructions (Addendum)
[facility] free help, from public health agencies, to find substance use treatment and information: [phone]  [place] Address: [address] [place], [place] [address] Phone: [phone] Intensive Outpatient Substance Use Services provided  [facility] Address: [place], [place], [place] [address] Phone: [phone]

## 2022-09-02 NOTE — BH IP Treatment Plan (Signed)
Interdisciplinary Treatment and Diagnostic Plan Update  09/02/2022 Time of Session: 10:00AM Penny Kim MRN: [id number]  Diagnosis:  Final diagnoses:  Cocaine abuse ([place])     Current Medications:  Current Facility-Administered Medications  Medication Dose Route Frequency Provider Last Rate Last Admin   acetaminophen (TYLENOL) tablet 650 mg  650 mg Oral Q6H PRN Tharon Aquas, NP       alum & mag hydroxide-simeth (MAALOX/MYLANTA) 200-200-20 MG/5ML suspension 30 mL  30 mL Oral Q4H PRN Tharon Aquas, NP       hydrOXYzine (ATARAX) tablet 25 mg  25 mg Oral TID PRN Tharon Aquas, NP       magnesium hydroxide (MILK OF MAGNESIA) suspension 30 mL  30 mL Oral Daily PRN Tharon Aquas, NP       traZODone (DESYREL) tablet 50 mg  50 mg Oral QHS PRN Tharon Aquas, NP       Current Outpatient Medications  Medication Sig Dispense Refill   acetaminophen (TYLENOL) 500 MG tablet Take 1,000 mg by mouth every 6 (six) hours as needed (For hip pain or fever.).     acyclovir (ZOVIRAX) 400 MG tablet Take 400 mg by mouth 3 (three) times daily.     amLODipine (NORVASC) 10 MG tablet Take 1 tablet (10 mg total) by mouth daily. Needs to keep appt for further refills 15 tablet 0   clobetasol ointment (TEMOVATE) 4.03 % Apply 1 Application topically 2 (two) times daily. Apply to affected area.     clonazePAM (KLONOPIN) 1 MG tablet Take 1 mg by mouth 4 (four) times daily as needed for anxiety.     doxycycline (MONODOX) 100 MG capsule Take 100 mg by mouth 2 (two) times daily. Take for 7 days starting on 08/31/22.     gabapentin (NEURONTIN) 300 MG capsule Take 300 mg by mouth 4 (four) times daily as needed (For pain).     mupirocin ointment (BACTROBAN) 2 % Apply 1 Application topically 3 (three) times daily. Apply to affected area.     triamcinolone cream (KENALOG) 0.5 % Apply 1 Application topically 2 (two) times daily. Apply sparingly to affected area.     TRINTELLIX 10 MG TABS tablet  Take 10 mg by mouth daily.     PTA Medications: Prior to Admission medications   Medication Sig Start Date End Date Taking? Authorizing Provider  acetaminophen (TYLENOL) 500 MG tablet Take 1,000 mg by mouth every 6 (six) hours as needed (For hip pain or fever.).   Yes [provider]  acyclovir (ZOVIRAX) 400 MG tablet Take 400 mg by mouth 3 (three) times daily. 07/22/22  Yes [provider]  amLODipine (NORVASC) 10 MG tablet Take 1 tablet (10 mg total) by mouth daily. Needs to keep appt for further refills 07/27/22  Yes End, Harrell Gave, MD  clobetasol ointment (TEMOVATE) 4.74 % Apply 1 Application topically 2 (two) times daily. Apply to affected area.   Yes [provider]  clonazePAM (KLONOPIN) 1 MG tablet Take 1 mg by mouth 4 (four) times daily as needed for anxiety. 12/03/21  Yes [provider]  doxycycline (MONODOX) 100 MG capsule Take 100 mg by mouth 2 (two) times daily. Take for 7 days starting on 08/31/22. 08/31/22  Yes [provider]  gabapentin (NEURONTIN) 300 MG capsule Take 300 mg by mouth 4 (four) times daily as needed (For pain). 01/31/21  Yes [provider]  mupirocin ointment (BACTROBAN) 2 % Apply 1 Application topically 3 (three) times daily. Apply  to affected area. 08/31/22  Yes [provider]  triamcinolone cream (KENALOG) 0.5 % Apply 1 Application topically 2 (two) times daily. Apply sparingly to affected area. 08/09/22  Yes [provider]  TRINTELLIX 10 MG TABS tablet Take 10 mg by mouth daily. 12/15/21  Yes [provider]    Patient Stressors: Financial difficulties   Marital or family conflict   Substance abuse    Patient Strengths: Ability for insight  Average or above average intelligence  Capable of independent living  Communication skills  General fund of knowledge   Treatment Modalities: Medication Management, Group therapy, Case management,  1 to 1 session with clinician,  Psychoeducation, Recreational therapy.   Physician Treatment Plan for Primary and Secondary Diagnosis:  Final diagnoses:  Cocaine abuse ([place])   Long Term Goal(s): Improvement in symptoms so as ready for discharge  Short Term Goals: Patient will verbalize feelings in meetings with treatment team members. Patient will attend at least of 50% of the groups daily. Pt will complete the PHQ9 on admission, day 3 and discharge. Patient will participate in completing the [place] Patient will take medications as prescribed daily.  Medication Management: Evaluate patient's response, side effects, and tolerance of medication regimen.  Therapeutic Interventions: 1 to 1 sessions, Unit Group sessions and Medication administration.  Evaluation of Outcomes: Progressing  LCSW Treatment Plan for Primary Diagnosis:  Final diagnoses:  Cocaine abuse ([place])    Long Term Goal(s): Safe transition to appropriate next level of care at discharge.  Short Term Goals: Facilitate acceptance of mental health diagnosis and concerns through verbal commitment to aftercare plan and appointments at discharge., Patient will identify one social support prior to discharge to aid in patient's recovery., Patient will attend AA/NA groups as scheduled., Identify minimum of 2 triggers associated with mental health/substance abuse issues with treatment team members., and Increase skills for wellness and recovery by attending 50% of scheduled groups.  Therapeutic Interventions: Assess for all discharge needs, 1 to 1 time with Education officer, museum, Explore available resources and support systems, Assess for adequacy in community support network, Educate family and significant other(s) on suicide prevention, Complete Psychosocial Assessment, Interpersonal group therapy.  Evaluation of Outcomes: Progressing   Progress in Treatment: Attending groups: No. Participating in groups: No. Taking medication as  prescribed: Yes. Toleration medication: Yes. Family/Significant other contact made: No, will contact:  Patient did not provide permission for LCSW to gain collateral at this time.  Patient understands diagnosis: Yes. Discussing patient identified problems/goals with staff: Yes. Medical problems stabilized or resolved: Yes. Denies suicidal/homicidal ideation: Yes. Issues/concerns per patient self-inventory: Yes. Other: substance use and need for detox  New problem(s) identified: No, Describe:  other than reported on admission  New Short Term/Long Term Goal(s): Safe transition to appropriate next level of care at discharge, Engage patient in therapeutic group addressing interpersonal concerns. Engage patient in aftercare planning with referrals and resources, Increase ability to appropriately verbalize feelings, Facilitate acceptance of mental health diagnosis and concerns and Identify triggers associated with mental health/substance abuse issues.    Patient Goals:  Patient reports no interest in residential placement at this time. Patient reports she was only reporting interest because she was pressured by family to get help. Patient reports her current goal is to seek intensive outpatient treatment for substance use. Patient reports she resides in [facility] and reports she would prefer services in that area. Patient reports she is not opposed to receiving IOP services in [place] if there  is no other option.  Discharge Plan or Barriers: LCSW will follow up to explore outpatient agencies in [facility] that can provide the services and will follow up to provide updates as received  Reason for Continuation of Hospitalization: Medication stabilization Withdrawal symptoms  Estimated Length of Stay: 3-5 days  Last 3 [place] Suicide Severity Risk Score: [place] [facility] ED from 09/01/2022 in [facility] ED from 08/13/2022 in [facility] ED to Hosp-Admission (Discharged) from 01/02/2022 in [facility] PCU  C-SSRS RISK CATEGORY No Risk No Risk No Risk       Last PHQ 2/9 Scores:    09/01/2022    8:48 AM 11/11/2020    1:18 PM 01/02/2018    9:51 AM  Depression screen PHQ 2/9  Decreased Interest '3 3 1  '$ Down, Depressed, Hopeless '2 2 2  '$ PHQ - 2 Score '5 5 3  '$ Altered sleeping '1 2 3  '$ Tired, decreased energy '3 3 3  '$ Change in appetite 0 1 3  Feeling bad or failure about yourself  '1 3 3  '$ Trouble concentrating '1 3 3  '$ Moving slowly or fidgety/restless 1 3 0  Suicidal thoughts 0 0 1  PHQ-9 Score '12 20 19  '$ Difficult doing work/chores Very difficult Very difficult Extremely dIfficult    Scribe for Treatment Team: Gigi Gin 09/02/2022 11:37 AM

## 2022-09-02 NOTE — Progress Notes (Signed)
LCSW was provided permission to contact her current provider at [facility]. LCSW contacted patient's Counseling Liaison [place] [facility], [place] to follow up regarding disposition plans. Per Mrs. Wiley, patient has been receiving services with the medical provider at their agency and would sometimes follow up with her for further assistance. Per Mrs. Wiley, the patient has missed multiple appointments and has not been compliant with recommendations provided by their agency. Mrs. Wiley reports she has noticed that there has likely been an increase in the patient's use, and reports the patient informed her that it was due to pain. Mrs. Wiley reports that the patient lives at home, and reports the family does somewhat pressure the patient however she believes they are concerned regarding her safety with the substance use. Mrs. Wiley reports that she was hoping that the patient would seek inpatient treatment, however was informed that the patient expressed no interested in residential placement. Patient did express interest in [facility] which Mrs. Wiley states [facility] in [place] is able to provide IOP. Agency only has walk-in hours. Mrs. Wiley reports she believes the patient needs a more concrete plan such as a scheduled appt in order to be successful. LCSW in agreement with plan. Mrs. Wiley made aware that LCSW will explore various options for [facility], and will follow up to provide updates as received. Mrs. Wiley expressed appreciation and no other needs were reported at this time.   Lucius Conn, LCSW Clinical Social Worker [facility] Ph: [phone]

## 2022-09-02 NOTE — ED Notes (Addendum)
Pt up to nurse's station stating her feet are itching due to psoriasis. Pt states she typically uses a special cream. Pt states she would like to use vaseline tonight. Evette Georges, NP notified.

## 2022-09-02 NOTE — Progress Notes (Signed)
Patient attending a group run by Education officer, museum.

## 2022-09-03 DIAGNOSIS — F1410 Cocaine abuse, uncomplicated: Secondary | ICD-10-CM | POA: Diagnosis not present

## 2022-09-03 MED ORDER — KETOCONAZOLE 2 % EX SHAM
MEDICATED_SHAMPOO | Freq: Every day | CUTANEOUS | Status: DC
  Filled 2022-09-03: qty 120

## 2022-09-03 NOTE — Discharge Planning (Signed)
LCSW spoke with patient on this morning to provide update. Patient informed that LCSW was able to arrange an appointment with [facility] on Wednesday September 08, 2022 at 10:00am. Patient was informed that [name] is offered M,W,F from 9am- 12pm for 8-12 weeks. Patient also made aware that she may stepdown to one day a week after completion but class will still be held from 9-12pm. Patient was appreciative of the assistance provided by LCSW. No other needs were reported at this time.   LCSW will continue to follow and provide support to patient while in [facility].    Lucius Conn, LCSW Clinical Social Worker [facility] Ph: [phone]

## 2022-09-03 NOTE — ED Notes (Signed)
Patient remains asleep in bed without issue or complaint.  Will monitor.

## 2022-09-03 NOTE — ED Notes (Signed)
Pt sleeping@this time. Breathing even and unlabored. Will continue to monitor for safety 

## 2022-09-03 NOTE — ED Provider Notes (Signed)
Behavioral Health Progress Note  Date and Time: 09/03/2022 4:50 PM Name: Penny Kim MRN:  [id number]  Subjective:  Penny Kim is a 51 year old female with psychiatric history depression and anxiety who presented to the [facility] for detox from cocaine use and assistance with substance use treatment.  On assessment today, patient reports that she is feeling unwell as she had poor sleep last night due to scalp irritation. She reports that her mood is otherwise "okay." She denies SI, HI, and AVH. Patient continues to endorse chills, nasal congestion and rhinorrhea, and abdominal pain but denies further withdrawal symptoms. She inquires about discharge, and she is advised that since she does have SAIOP arranged, she is able to continue detox protocol but discharge is near. She is agreeable and requests discharge tomorrow.    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 TTE 10/2016: EF 50-55%, no RWMA, normal LV diastolic function, left atrium normal, RV systolic function normal, PASP normal   History of stress test    a. treadmill Myoview 11/2016: small defect of mild severity present in the apex location felt to be secondary to breast attenuation. EF 55-65%. Normal study   HTN (hypertension)    Orthostatic hypotension    Psoriasis     Past Surgical History:  Procedure Laterality Date   BREAST RECONSTRUCTION  01/06/2012   Procedure: BREAST RECONSTRUCTION;  Surgeon:  Crissie Reese, MD;  Location: [facility];  Service: Plastics;  Laterality: Bilateral;  bilateral removal of tissue expanders, bilateral placement of implants--Breast   BREAST SURGERY  06/19/2010   exploration of right mastectomy site due to post-op bleeding   INSERTION OF TISSUE EXPANDER AFTER MASTECTOMY  06/19/2010   bilat. mastectomies with bilat. 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   Hypertension Maternal Grandfather    Diabetes Paternal Grandmother    Hypertension Paternal Grandmother    Kidney disease Paternal Grandmother        End stage renal dz - dialysis   Hyperlipidemia Paternal Grandmother    Breast cancer Paternal Grandmother    Alcohol abuse Father    Depression Father    Drug abuse Father 25       Died of drug overdose   Family Psychiatric  History: See H&P Social History:  Social History   Substance and Sexual Activity  Alcohol  Use No   Comment: occasionally     Social History   Substance and Sexual Activity  Drug Use No    Social History   Socioeconomic History   Marital status: Single    Spouse name: Not on file   Number of children: Not on file   Years of education: 18   Highest education level: Not on file  Occupational History   Occupation: Scientist, research (medical) Relations    Comment: Ambulance person  Tobacco Use   Smoking status: Never   Smokeless tobacco: Never  Vaping Use   Vaping Use: Never used  Substance and Sexual Activity   Alcohol use: No    Comment: occasionally   Drug  use: No   Sexual activity: Not Currently    Birth control/protection: None  Other Topics Concern   Not on file  Social History Narrative   Magdelene [place], [place]. She attended [employer] where she obtained her Bachelors of Science degree in Psychology. 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 She currently lives in [place]. She lives alone. Jayleene enjoys attending live musical performances. 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    Social Determinants of Health   Financial Resource Strain: Not on file  Food Insecurity: Not on file  Transportation Needs: Not on file  Physical Activity: Not on file  Stress: Not on file  Social Connections: Not on file   SDOH:  SDOH Screenings   Alcohol Screen: Low Risk  (05/12/2018)  Depression (PHQ2-9): High Risk (09/01/2022)  Tobacco Use: Low Risk  (01/05/2022)   Additional Social History:    Pain Medications: SEE MAR Prescriptions: SEE MAR Over the Counter: SEE MAR History of alcohol / drug use?: Yes Longest period of sobriety (when/how long): STARTED USING COCAINE IN MARCH Negative Consequences of Use:  (n/a) Withdrawal Symptoms: None Name of Substance 1: COCAINE 1 - Age of First Use: 46 1 - Amount (size/oz): UNKNOWN 1 - Frequency: 2-3 TIMES A WEEK 1 - Duration: ONGOING 1 - Last Use / Amount: TODAY/UNKNOWN 1 - Method of Aquiring: UNKNOWN 1- Route of Use: SNORTING                  Sleep: Fair  Appetite:  Fair  Current Medications:  Current Facility-Administered Medications  Medication Dose Route Frequency Provider Last Rate Last Admin   acetaminophen (TYLENOL) tablet 650 mg  650 mg Oral Q6H PRN Tharon Aquas, NP       acyclovir (ZOVIRAX) 200 MG capsule 400 mg  400 mg Oral TID Rosezetta Schlatter, MD   400 mg at 09/03/22 1506   alum & mag hydroxide-simeth (MAALOX/MYLANTA) 200-200-20 MG/5ML suspension 30 mL  30 mL  Oral Q4H PRN Tharon Aquas, NP       amLODipine (NORVASC) tablet 10 mg  10 mg Oral Daily Rosezetta Schlatter, MD   10 mg at 09/03/22 0902   clobetasol ointment (TEMOVATE) 6.43 % 1 Application  1 Application Topical BID Rosezetta Schlatter, MD   1 Application at 32/95/18 0902   clonazePAM (KLONOPIN) tablet 0.5 mg  0.5 mg Oral BID Rosezetta Schlatter, MD   0.5 mg at 09/03/22 0844   Followed by   Derrill Memo ON 09/04/2022] clonazePAM (KLONOPIN) tablet 0.5 mg  0.5 mg Oral Daily Khadeem Rockett, Loma Sousa, MD       cloNIDine (CATAPRES) tablet 0.1 mg  0.1 mg Oral Q8H PRN Prabhnoor Ellenberger,  Loma Sousa, MD       dicyclomine (BENTYL) tablet 20 mg  20 mg Oral Q6H PRN Rosezetta Schlatter, MD       gabapentin (NEURONTIN) capsule 300 mg  300 mg Oral QID PRN Rosezetta Schlatter, MD       hydrOXYzine (ATARAX) tablet 25 mg  25 mg Oral TID PRN Tharon Aquas, NP       ketoconazole (NIZORAL) 2 % shampoo   Topical Daily Rosezetta Schlatter, MD       loperamide (IMODIUM) capsule 2-4 mg  2-4 mg Oral PRN Rosezetta Schlatter, MD       magnesium hydroxide (MILK OF MAGNESIA) suspension 30 mL  30 mL Oral Daily PRN Tharon Aquas, NP       methocarbamol (ROBAXIN) tablet 500 mg  500 mg Oral Q8H PRN Rosezetta Schlatter, MD       ondansetron (ZOFRAN-ODT) disintegrating tablet 4 mg  4 mg Oral Q6H PRN Rosezetta Schlatter, MD       traZODone (DESYREL) tablet 50 mg  50 mg Oral QHS PRN Tharon Aquas, NP       triamcinolone cream (KENALOG) 0.5 % 1 Application  1 Application Topical BID Rosezetta Schlatter, MD   1 Application at 22/29/79 0902   vortioxetine HBr (TRINTELLIX) tablet 10 mg  10 mg Oral Daily Rosezetta Schlatter, MD   10 mg at 09/03/22 8921   Current Outpatient Medications  Medication Sig Dispense Refill   acetaminophen (TYLENOL) 500 MG tablet Take 1,000 mg by mouth every 6 (six) hours as needed (For hip pain or fever.).     acyclovir (ZOVIRAX) 400 MG tablet Take 400 mg by mouth 3 (three) times daily.     amLODipine (NORVASC) 10 MG tablet Take 1 tablet (10 mg total) by  mouth daily. Needs to keep appt for further refills 15 tablet 0   clobetasol ointment (TEMOVATE) 1.94 % Apply 1 Application topically 2 (two) times daily. Apply to affected area.     clonazePAM (KLONOPIN) 1 MG tablet Take 1 mg by mouth 4 (four) times daily as needed for anxiety.     doxycycline (MONODOX) 100 MG capsule Take 100 mg by mouth 2 (two) times daily. Take for 7 days starting on 08/31/22.     gabapentin (NEURONTIN) 300 MG capsule Take 300 mg by mouth 4 (four) times daily as needed (For pain).     mupirocin ointment (BACTROBAN) 2 % Apply 1 Application topically 3 (three) times daily. Apply to affected area.     triamcinolone cream (KENALOG) 0.5 % Apply 1 Application topically 2 (two) times daily. Apply sparingly to affected area.     TRINTELLIX 10 MG TABS tablet Take 10 mg by mouth daily.      Labs  Lab Results:  Admission on 09/01/2022  Component Date Value Ref Range Status   SARS Coronavirus 2 by RT PCR 09/01/2022 NEGATIVE  NEGATIVE Final   Comment: (NOTE) SARS-CoV-2 target nucleic acids are NOT DETECTED.  The SARS-CoV-2 RNA is generally detectable in upper respiratory specimens during the acute phase of infection. The lowest concentration of SARS-CoV-2 viral copies this assay can detect is 138 copies/mL. A negative result does not preclude SARS-Cov-2 infection and should not be used as the sole basis for treatment or other patient management decisions. A negative result may occur with  improper specimen collection/handling, submission of specimen other than nasopharyngeal swab, presence of viral mutation(s) within the areas targeted by this assay, and inadequate number of viral copies(<138 copies/mL). A negative result  must be combined with clinical observations, patient history, and epidemiological information. The expected result is Negative.  Fact Sheet for Patients:  EntrepreneurPulse.com.au  Fact Sheet for Healthcare Providers:   IncredibleEmployment.be  This test is no                          t yet approved or cleared by the [place] FDA and  has been authorized for detection and/or diagnosis of SARS-CoV-2 by FDA under an Emergency Use Authorization (EUA). This EUA will remain  in effect (meaning this test can be used) for the duration of the COVID-19 declaration under Section 564(b)(1) of the Act, 21 U.S.C.section 360bbb-3(b)(1), unless the authorization is terminated  or revoked sooner.       Influenza A by PCR 09/01/2022 NEGATIVE  NEGATIVE Final   Influenza B by PCR 09/01/2022 NEGATIVE  NEGATIVE Final   Comment: (NOTE) The Xpert Xpress SARS-CoV-2/FLU/RSV plus assay is intended as an aid in the diagnosis of influenza from Nasopharyngeal swab specimens and should not be used as a sole basis for treatment. Nasal washings and aspirates are unacceptable for Xpert Xpress SARS-CoV-2/FLU/RSV testing.  Fact Sheet for Patients: EntrepreneurPulse.com.au  Fact Sheet for Healthcare Providers: IncredibleEmployment.be  This test is not yet approved or cleared by the [place] FDA and has been authorized for detection and/or diagnosis of SARS-CoV-2 by FDA under an Emergency Use Authorization (EUA). This EUA will remain in effect (meaning this test can be used) for the duration of the COVID-19 declaration under Section 564(b)(1) of the Act, 21 U.S.C. section 360bbb-3(b)(1), unless the authorization is terminated or revoked.     Resp Syncytial Virus by PCR 09/01/2022 NEGATIVE  NEGATIVE Final   Comment: (NOTE) Fact Sheet for Patients: EntrepreneurPulse.com.au  Fact Sheet for Healthcare Providers: IncredibleEmployment.be  This test is not yet approved or cleared by the [place] FDA and has been authorized for detection and/or diagnosis of SARS-CoV-2 by FDA under an Emergency Use Authorization (EUA). This EUA  will remain in effect (meaning this test can be used) for the duration of the COVID-19 declaration under Section 564(b)(1) of the Act, 21 U.S.C. section 360bbb-3(b)(1), unless the authorization is terminated or revoked.  Performed at [facility], [place] [address] [place], [place] [address]    WBC 09/01/2022 4.5  4.0 - 10.5 K/uL Final   RBC 09/01/2022 4.31  3.87 - 5.11 MIL/uL Final   Hemoglobin 09/01/2022 11.3 (L)  12.0 - 15.0 g/dL Final   HCT 09/01/2022 33.7 (L)  36.0 - 46.0 % Final   MCV 09/01/2022 78.2 (L)  80.0 - 100.0 fL Final   MCH 09/01/2022 26.2  26.0 - 34.0 pg Final   MCHC 09/01/2022 33.5  30.0 - 36.0 g/dL Final   RDW 09/01/2022 15.9 (H)  11.5 - 15.5 % Final   Platelets 09/01/2022 341  150 - 400 K/uL Final   nRBC 09/01/2022 0.0  0.0 - 0.2 % Final   Neutrophils Relative % 09/01/2022 63  % Final   Neutro Abs 09/01/2022 2.8  1.7 - 7.7 K/uL Final   Lymphocytes Relative 09/01/2022 28  % Final   Lymphs Abs 09/01/2022 1.3  0.7 - 4.0 K/uL Final   Monocytes Relative 09/01/2022 7  % Final   Monocytes Absolute 09/01/2022 0.3  0.1 - 1.0 K/uL Final   Eosinophils Relative 09/01/2022 1  % Final   Eosinophils Absolute 09/01/2022 0.1  0.0 - 0.5 K/uL Final   Basophils Relative 09/01/2022  1  % Final   Basophils Absolute 09/01/2022 0.1  0.0 - 0.1 K/uL Final   Immature Granulocytes 09/01/2022 0  % Final   Abs Immature Granulocytes 09/01/2022 0.00  0.00 - 0.07 K/uL Final   Performed at [facility], [place] [address] [place], [place] [address]   Sodium 09/01/2022 140  135 - 145 mmol/L Final   Potassium 09/01/2022 4.0  3.5 - 5.1 mmol/L Final   Chloride 09/01/2022 102  98 - 111 mmol/L Final   CO2 09/01/2022 28  22 - 32 mmol/L Final   Glucose, Bld 09/01/2022 109 (H)  70 - 99 mg/dL Final   Glucose reference range applies only to samples taken after fasting for at least 8 hours.   BUN 09/01/2022 6  6 - 20 mg/dL Final   Creatinine, Ser 09/01/2022 0.82  0.44 - 1.00 mg/dL Final   Calcium  09/01/2022 9.8  8.9 - 10.3 mg/dL Final   Total Protein 09/01/2022 7.7  6.5 - 8.1 g/dL Final   Albumin 09/01/2022 4.1  3.5 - 5.0 g/dL Final   AST 09/01/2022 23  15 - 41 U/L Final   ALT 09/01/2022 17  0 - 44 U/L Final   Alkaline Phosphatase 09/01/2022 58  38 - 126 U/L Final   Total Bilirubin 09/01/2022 0.2 (L)  0.3 - 1.2 mg/dL Final   GFR, Estimated 09/01/2022 >60  >60 mL/min Final   Comment: (NOTE) Calculated using the CKD-EPI Creatinine Equation (2021)    Anion gap 09/01/2022 10  5 - 15 Final   Performed at [facility] [address] [place], [place] [address]   Hgb A1c MFr Bld 09/01/2022 6.1 (H)  4.8 - 5.6 % Final   Comment: (NOTE)         Prediabetes: 5.7 - 6.4         Diabetes: >6.4         Glycemic control for adults with diabetes: <7.0    Mean Plasma Glucose 09/01/2022 128  mg/dL Final   Comment: (NOTE) Performed At: [facility] [place], [place] [id number] Rush Farmer MD [id number]    Alcohol, Ethyl (B) 09/01/2022 <10  <10 mg/dL Final   Comment: (NOTE) Lowest detectable limit for serum alcohol is 10 mg/dL.  For medical purposes only. Performed at [facility], [place] [address], [place], [place] [address]    Cholesterol 09/01/2022 217 (H)  0 - 200 mg/dL Final   Triglycerides 09/01/2022 77  <150 mg/dL Final   HDL 09/01/2022 77  >40 mg/dL Final   Total CHOL/HDL Ratio 09/01/2022 2.8  RATIO Final   VLDL 09/01/2022 15  0 - 40 mg/dL Final   LDL Cholesterol 09/01/2022 125 (H)  0 - 99 mg/dL Final   Comment:        Total Cholesterol/HDL:CHD Risk Coronary Heart Disease Risk Table                     Men   Women  1/2 Average Risk   3.4   3.3  Average Risk       5.0   4.4  2 X Average Risk   9.6   7.1  3 X Average Risk  23.4   11.0        Use the calculated Patient Ratio above and the CHD Risk Table to determine the patient's CHD Risk.        ATP III CLASSIFICATION (LDL):  <100  mg/dL   Optimal  100-129  mg/dL   Near or  Above                    Optimal  130-159  mg/dL   Borderline  160-189  mg/dL   High  >190     mg/dL   Very High Performed at [place] [address] [place], [place] [address]    TSH 09/01/2022 1.372  0.350 - 4.500 uIU/mL Final   Comment: Performed by a 3rd Generation assay with a functional sensitivity of <=0.01 uIU/mL. Performed at [facility], [place] [address] [place], [place] [address]    Color, Urine 09/01/2022 YELLOW  YELLOW Final   APPearance 09/01/2022 HAZY (A)  CLEAR Final   Specific Gravity, Urine 09/01/2022 1.025  1.005 - 1.030 Final   pH 09/01/2022 5.0  5.0 - 8.0 Final   Glucose, UA 09/01/2022 NEGATIVE  NEGATIVE mg/dL Final   Hgb urine dipstick 09/01/2022 NEGATIVE  NEGATIVE Final   Bilirubin Urine 09/01/2022 NEGATIVE  NEGATIVE Final   Ketones, ur 09/01/2022 NEGATIVE  NEGATIVE mg/dL Final   Protein, ur 09/01/2022 100 (A)  NEGATIVE mg/dL Final   Nitrite 09/01/2022 NEGATIVE  NEGATIVE Final   Leukocytes,Ua 09/01/2022 NEGATIVE  NEGATIVE Final   RBC / HPF 09/01/2022 0-5  0 - 5 RBC/hpf Final   WBC, UA 09/01/2022 0-5  0 - 5 WBC/hpf Final   Bacteria, UA 09/01/2022 NONE SEEN  NONE SEEN Final   Squamous Epithelial / LPF 09/01/2022 0-5  0 - 5 Final   Mucus 09/01/2022 PRESENT   Final   Performed at [facility], [place] [address] [place], [place] [address]   Preg Test, Ur 09/01/2022 Negative  Negative Final   POC Amphetamine UR 09/01/2022 None Detected  NONE DETECTED (Cut Off Level 1000 ng/mL) Final   POC Secobarbital (BAR) 09/01/2022 None Detected  NONE DETECTED (Cut Off Level 300 ng/mL) Final   POC Buprenorphine (BUP) 09/01/2022 None Detected  NONE DETECTED (Cut Off Level 10 ng/mL) Final   POC Oxazepam (BZO) 09/01/2022 Positive (A)  NONE DETECTED (Cut Off Level 300 ng/mL) Final   POC Cocaine UR 09/01/2022 Positive (A)  NONE DETECTED (Cut Off Level 300 ng/mL) Final   POC Methamphetamine UR 09/01/2022 None Detected  NONE DETECTED (Cut Off Level 1000 ng/mL)  Final   POC Morphine 09/01/2022 None Detected  NONE DETECTED (Cut Off Level 300 ng/mL) Final   POC Methadone UR 09/01/2022 None Detected  NONE DETECTED (Cut Off Level 300 ng/mL) Final   POC Oxycodone UR 09/01/2022 None Detected  NONE DETECTED (Cut Off Level 100 ng/mL) Final   POC Marijuana UR 09/01/2022 None Detected  NONE DETECTED (Cut Off Level 50 ng/mL) Final  Admission on 08/13/2022, Discharged on 08/13/2022  Component Date Value Ref Range Status   Sodium 08/13/2022 139  135 - 145 mmol/L Final   Potassium 08/13/2022 4.1  3.5 - 5.1 mmol/L Final   HEMOLYSIS AT THIS LEVEL MAY AFFECT RESULT   Chloride 08/13/2022 102  98 - 111 mmol/L Final   CO2 08/13/2022 24  22 - 32 mmol/L Final   Glucose, Bld 08/13/2022 91  70 - 99 mg/dL Final   Glucose reference range applies only to samples taken after fasting for at least 8 hours.   BUN 08/13/2022 11  6 - 20 mg/dL Final   Creatinine, Ser 08/13/2022 0.88  0.44 - 1.00 mg/dL Final   Calcium 08/13/2022 9.6  8.9 - 10.3  mg/dL Final   Total Protein 08/13/2022 8.3 (H)  6.5 - 8.1 g/dL Final   Albumin 08/13/2022 4.3  3.5 - 5.0 g/dL Final   AST 08/13/2022 47 (H)  15 - 41 U/L Final   ALT 08/13/2022 17  0 - 44 U/L Final   Alkaline Phosphatase 08/13/2022 62  38 - 126 U/L Final   Total Bilirubin 08/13/2022 0.7  0.3 - 1.2 mg/dL Final   GFR, Estimated 08/13/2022 >60  >60 mL/min Final   Comment: (NOTE) Calculated using the CKD-EPI Creatinine Equation (2021)    Anion gap 08/13/2022 13  5 - 15 Final   Performed at [facility], [place] [place], [place] [address]   Alcohol, Ethyl (B) 08/13/2022 <10  <10 mg/dL Final   Comment: (NOTE) Lowest detectable limit for serum alcohol is 10 mg/dL.  For medical purposes only. Performed at [facility], [place] [place], [place] [address]    Salicylate Lvl 62/56/3893 13.6  7.0 - 30.0 mg/dL Final   Performed at [facility], [place], [place] [address]    Acetaminophen (Tylenol), Serum 08/13/2022 <10 (L)  10 - 30 ug/mL Final   Comment: (NOTE) Therapeutic concentrations vary significantly. A range of 10-30 ug/mL  may be an effective concentration for many patients. However, some  are best treated at concentrations outside of this range. Acetaminophen concentrations >150 ug/mL at 4 hours after ingestion  and >50 ug/mL at 12 hours after ingestion are often associated with  toxic reactions.  Performed at [facility], [place] [place], [place] [address]    WBC 08/13/2022 5.4  4.0 - 10.5 K/uL Final   RBC 08/13/2022 4.42  3.87 - 5.11 MIL/uL Final   Hemoglobin 08/13/2022 10.7 (L)  12.0 - 15.0 g/dL Final   HCT 08/13/2022 33.7 (L)  36.0 - 46.0 % Final   MCV 08/13/2022 76.2 (L)  80.0 - 100.0 fL Final   MCH 08/13/2022 24.2 (L)  26.0 - 34.0 pg Final   MCHC 08/13/2022 31.8  30.0 - 36.0 g/dL Final   RDW 08/13/2022 15.4  11.5 - 15.5 % Final   Platelets 08/13/2022 350  150 - 400 K/uL Final   nRBC 08/13/2022 0.0  0.0 - 0.2 % Final   Performed at [facility], [place] [place], [place] [address]   Tricyclic, Ur Screen 03/55/9741 NONE DETECTED  NONE DETECTED Final   Amphetamines, Ur Screen 08/13/2022 NONE DETECTED  NONE DETECTED Final   MDMA (Ecstasy)Ur Screen 08/13/2022 NONE DETECTED  NONE DETECTED Final   Cocaine Metabolite,Ur [place] 08/13/2022 POSITIVE (A)  NONE DETECTED Final   Opiate, Ur Screen 08/13/2022 NONE DETECTED  NONE DETECTED Final   Phencyclidine (PCP) Ur S 08/13/2022 NONE DETECTED  NONE DETECTED Final   Cannabinoid 50 Ng, Ur [place] 08/13/2022 NONE DETECTED  NONE DETECTED Final   Barbiturates, Ur Screen 08/13/2022 NONE DETECTED  NONE DETECTED Final   Benzodiazepine, Ur Scrn 08/13/2022 NONE DETECTED  NONE DETECTED Final   Methadone Scn, Ur 08/13/2022 NONE DETECTED  NONE DETECTED Final   Comment: (NOTE) Tricyclics + metabolites, urine    Cutoff 1000 ng/mL Amphetamines + metabolites, urine  Cutoff 1000 ng/mL MDMA  (Ecstasy), urine              Cutoff 500 ng/mL Cocaine Metabolite, urine          Cutoff 300 ng/mL Opiate + metabolites, urine        Cutoff 300 ng/mL Phencyclidine (PCP),  urine         Cutoff 25 ng/mL Cannabinoid, urine                 Cutoff 50 ng/mL Barbiturates + metabolites, urine  Cutoff 200 ng/mL Benzodiazepine, urine              Cutoff 200 ng/mL Methadone, urine                   Cutoff 300 ng/mL  The urine drug screen provides only a preliminary, unconfirmed analytical test result and should not be used for non-medical purposes. Clinical consideration and professional judgment should be applied to any positive drug screen result due to possible interfering substances. A more specific alternate chemical method must be used in order to obtain a confirmed analytical result. Gas chromatography / mass spectrometry (GC/MS) is the preferred confirm                          atory method. Performed at [facility], [place] [place], [place] [address]     Blood Alcohol level:  Lab Results  Component Value Date   [address] <10 09/01/2022   ETH <10 31/49/7026    Metabolic Disorder Labs: Lab Results  Component Value Date   HGBA1C 6.1 (H) 09/01/2022   MPG 128 09/01/2022   No results found for: "PROLACTIN" Lab Results  Component Value Date   CHOL 217 (H) 09/01/2022   TRIG 77 09/01/2022   HDL 77 09/01/2022   CHOLHDL 2.8 09/01/2022   VLDL 15 09/01/2022   LDLCALC 125 (H) 09/01/2022   LDLCALC 120 (H) 02/06/2021    Therapeutic Lab Levels: No results found for: "LITHIUM" No results found for: "VALPROATE" No results found for: "CBMZ"  Physical Findings   PHQ2-9    Flowsheet Row ED from 09/01/2022 in [facility] Counselor from 11/11/2020 in [facility] Office Visit from 12/30/2017 in [facility] Visit from 09/22/2017 in [facility] Visit from 10/19/2016 in [facility]  PHQ-2 Total Score '5 5 3 6 '$ 0  PHQ-9 Total Score '12 20 19 23 '$ --      Flowsheet Row ED from 09/01/2022 in [facility] ED from 08/13/2022 in [place] ED to Hosp-Admission (Discharged) from 01/02/2022 in [facility] PCU  C-SSRS RISK CATEGORY No Risk No Risk No Risk        Musculoskeletal  Strength & Muscle Tone: within normal limits and patient able to pull self up to a seated position from lying down without issue Gait & Station: unsteady, due to hip Patient leans:  Unobserved as patient was lying in bed  Psychiatric Specialty Exam  Presentation  General Appearance:  Appropriate for Environment; Casual; Fairly Groomed  Eye Contact: Good  Speech: Clear and Coherent; Normal Rate  Speech Volume: Normal  Handedness: Right   Mood and Affect  Mood: Irritable  Affect: Appropriate; Congruent (Somewhat irritable but with physical discomfort, not this Probation officer.)   Thought Process  Thought Processes: Coherent; Linear  Descriptions of Associations:Intact  Orientation:Full (Time, Place and Person)  Thought Content:Logical; WDL  Diagnosis of Schizophrenia or Schizoaffective disorder in past: No    Hallucinations:Hallucinations: None   Ideas of Reference:None  Suicidal Thoughts:Suicidal Thoughts: No   Homicidal Thoughts:Homicidal Thoughts: No    Sensorium  Memory: Immediate Fair  Judgment: Fair  Insight: Fair  Executive Functions  Concentration: Good  Attention Span: Good  Recall: Roel Cluck of Knowledge: Fair  Language: Fair   Psychomotor Activity  Psychomotor Activity: Psychomotor Activity: Normal (ambulates with cane with steady gait)    Assets  Assets: Communication Skills; Desire for Improvement; Financial Resources/Insurance; Housing; Resilience; Social Support; Transportation   Sleep  Sleep: Sleep:  Fair    No data recorded   Physical Exam  Physical Exam Vitals reviewed.  Constitutional:      General: She is not in acute distress. HENT:     Head: Normocephalic and atraumatic.     Nose: Congestion and rhinorrhea present.     Mouth/Throat:     Mouth: Mucous membranes are moist.     Pharynx: Oropharynx is clear.  Pulmonary:     Effort: Pulmonary effort is normal.  Skin:    General: Skin is warm and dry.  Neurological:     General: No focal deficit present.     Mental Status: She is alert and oriented to person, place, and time.    Review of Systems  Constitutional:  Positive for chills. Negative for diaphoresis and malaise/fatigue.  Cardiovascular:  Negative for chest pain and palpitations.  Gastrointestinal:  Positive for abdominal pain. Negative for diarrhea, nausea and vomiting.  Musculoskeletal:  Negative for myalgias.       High fall risk  Neurological:  Negative for dizziness, seizures and headaches.   Blood pressure 134/73, pulse 63, temperature 98.5 F (36.9 C), temperature source Oral, resp. rate 17, last menstrual period 09/20/2010, SpO2 98 %. There is no height or weight on file to calculate BMI.  Treatment Plan Summary: Daily contact with patient to assess and evaluate symptoms and progress in treatment and Medication management BOYD LITAKER is a 52 year old female with psychiatric history depression and anxiety who presented to the [facility] for detox from cocaine use and assistance with substance use treatment.  At this time, patient reports that she would like to change her cocaine use, but she is not yet committed to seeking residential treatment at this time.  Patient voices that she would like to attempt to discontinue use via intensive outpatient.  She is reminded that relapse is a part of recovery, and we are here to support her in the case that she would need to return, and she is appreciative of that reminder.  Home medications resumed (with changes): -  Trintellix 10 mg daily - Gabapentin 300 mg 4 times daily as needed pain - Klonopin taper with 1 mg twice daily x 1 day, followed by 0.5 mg twice daily x 1 day, followed by 0.5 mg x 1 day, then discontinue. -Acyclovir 400 mg 3 times daily -Norvasc 10 mg daily -Triamcinolone cream twice daily - Clobetasol ointment twice daily  #Cocaine use disorder #Benzodiazepine use, prescribed -Start CIWA and COWS monitoring -0.1 mg Clonidine as needed for SBP greater than 160 or DBP greater than 90 due to patient currently on antihypertensive therapy -Continue Bentyl 20 mg every 6H PRN for  spasm, abdominal cramping x5 days -Continue Robaxin 500 mg every 8 hours as needed for muscle spasm x5 days -Continue naproxen 500 mg twice daily as needed for aching, pain or discomfort x5 days. -Continue Zofran 4 mg every 6 hours as needed for nausea or vomiting. -Continue Imodium 2 to 4 mg as needed for diarrhea or loose stools for 72 hours.  -Continue hydroxyzine 25 mg every 6 hours as needed for anxiety -Continue thiamine 100 mg daily. -Continue multivitamin  with minerals daily.    #Scalp irritation with lesion c/w tinea capitis -START Ketoconazole shampoo daily.                                                    Dispo: Patient accepted to [facility] in [place], [place] on Monday 12/18 at 10 AM. Will discharge home tomorrow.  Rosezetta Schlatter, MD 09/03/2022 4:50 PM

## 2022-09-03 NOTE — ED Notes (Signed)
Pt is in the bed sleeping. Respirations are even and unlabored. No acute distress noted. Will continue to monitor for safety. 

## 2022-09-04 DIAGNOSIS — F1410 Cocaine abuse, uncomplicated: Secondary | ICD-10-CM | POA: Diagnosis not present

## 2022-09-04 MED ORDER — HYDROXYZINE HCL 25 MG PO TABS: 25.0000 mg | ORAL_TABLET | Freq: Three times a day (TID) | ORAL | 0 refills | Status: DC | PRN

## 2022-09-04 MED ORDER — KETOCONAZOLE 2 % EX SHAM: MEDICATED_SHAMPOO | Freq: Every day | CUTANEOUS | 0 refills | Status: DC

## 2022-09-04 MED ORDER — TRAZODONE HCL 50 MG PO TABS: 50.0000 mg | ORAL_TABLET | Freq: Every evening | ORAL | 0 refills | Status: DC | PRN

## 2022-09-04 NOTE — ED Provider Notes (Signed)
FBC/OBS ASAP Discharge Summary  Date and Time: 09/04/2022 10:44 AM  Name: Penny Kim  MRN:  [id number]   Discharge Diagnoses:  Final diagnoses:  Cocaine abuse ([place])    Subjective:  Penny Kim is a 51 year old female with psychiatric history depression and anxiety who presented to the [facility] for detox from cocaine use and assistance with substance use treatment.   Stay Summary: Patient was recommended to consider inpatient rehabilitation for substance use however, the patient endorsed feeling not ready for this level of care but was willing to start intensive outpatient programming.  Patient endorsed chills, nasal congestion and rhinorrhea as well as abdominal pain throughout early part of her stay.  Patient also endorsed feelings of abandonment.  Patient was continued on her home Trintellix mood as well as gabapentin for pain.  Patient was started on a Klonopin taper, due to his being on medication however it was advised that patient no longer continue this medication due to other substance use and begin sobriety.  Patient was placed on CIWA and COWS, with no complications throughout stay.  Patient completed Klonopin titration.  Patient was accepted to [facility] and.  Patient also had her relationship with her aunt and feel more supported.  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 TTE 10/2016: EF 50-55%, no RWMA, normal LV diastolic function, left atrium normal, RV  systolic function normal, PASP normal   History of stress test    a. treadmill Myoview 11/2016: small defect of mild severity present in the apex location felt to be secondary to breast attenuation. EF 55-65%. Normal study   HTN (hypertension)    Orthostatic hypotension    Psoriasis     Past Surgical History:  Procedure Laterality Date   BREAST RECONSTRUCTION  01/06/2012   Procedure: BREAST RECONSTRUCTION;  Surgeon: Crissie Reese, MD;  Location: [facility];  Service: Plastics;  Laterality: Bilateral;  bilateral removal of tissue expanders, bilateral placement of implants--Breast   BREAST SURGERY  06/19/2010   exploration of right mastectomy site due to post-op bleeding   INSERTION OF TISSUE EXPANDER AFTER MASTECTOMY  06/19/2010   bilat. mastectomies with bilat. 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   Hypertension Maternal Grandfather    Diabetes Paternal Grandmother    Hypertension Paternal Grandmother    Kidney disease Paternal Grandmother  End stage renal dz - dialysis   Hyperlipidemia Paternal Grandmother    Breast cancer Paternal Grandmother    Alcohol abuse Father    Depression Father    Drug abuse Father 18       Died of drug overdose   Family Psychiatric History: Unknown at this time Social History:  Social History   Substance and Sexual Activity  Alcohol Use No   Comment: occasionally     Social History   Substance and Sexual Activity  Drug Use No    Social History   Socioeconomic History   Marital status:  Single    Spouse name: Not on file   Number of children: Not on file   Years of education: 18   Highest education level: Not on file  Occupational History   Occupation: Scientist, research (medical) Relations    Comment: Ambulance person  Tobacco Use   Smoking status: Never   Smokeless tobacco: Never  Vaping Use   Vaping Use: Never used  Substance and Sexual Activity   Alcohol use: No    Comment: occasionally   Drug use: No   Sexual activity: Not Currently    Birth control/protection: None  Other Topics Concern   Not on file  Social History Narrative   Samarra [place], [place]. She attended [employer] where she obtained her Bachelors of Science degree in Psychology. 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 She currently lives in [place]. She lives alone. Kaya enjoys attending live musical performances. 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    Social Determinants of Health   Financial Resource Strain: Not on file  Food Insecurity: Not on file  Transportation Needs: Not on file  Physical Activity: Not on file  Stress: Not on file  Social Connections: Not on file   SDOH:  SDOH Screenings   Alcohol Screen: Low Risk  (05/12/2018)  Depression (PHQ2-9): High Risk (09/01/2022)  Tobacco Use: Low Risk  (01/05/2022)    Tobacco Cessation:  N/A, patient does not currently use tobacco products  Current Medications:  Current Facility-Administered Medications  Medication Dose Route Frequency Provider Last Rate Last Admin   acetaminophen (TYLENOL) tablet 650 mg  650 mg Oral Q6H PRN Tharon Aquas, NP       acyclovir (ZOVIRAX) 200 MG capsule 400 mg  400 mg Oral TID Rosezetta Schlatter, MD   400 mg at 09/04/22 1006   alum & mag hydroxide-simeth (MAALOX/MYLANTA) 200-200-20 MG/5ML suspension 30 mL  30 mL Oral Q4H PRN Tharon Aquas, NP       amLODipine (NORVASC)  tablet 10 mg  10 mg Oral Daily Rosezetta Schlatter, MD   10 mg at 09/04/22 1006   clobetasol ointment (TEMOVATE) 8.03 % 1 Application  1 Application Topical BID Rosezetta Schlatter, MD   1 Application at 21/22/48 1010   cloNIDine (CATAPRES) tablet 0.1 mg  0.1 mg Oral Q8H PRN Rosezetta Schlatter, MD       dicyclomine (BENTYL) tablet 20 mg  20 mg Oral Q6H PRN Rosezetta Schlatter, MD       gabapentin (NEURONTIN) capsule 300 mg  300 mg Oral QID PRN Rosezetta Schlatter, MD       hydrOXYzine (ATARAX) tablet 25 mg  25 mg Oral TID PRN Tharon Aquas, NP       ketoconazole (NIZORAL) 2 % shampoo   Topical Daily Rosezetta Schlatter, MD  loperamide (IMODIUM) capsule 2-4 mg  2-4 mg Oral PRN Rosezetta Schlatter, MD       magnesium hydroxide (MILK OF MAGNESIA) suspension 30 mL  30 mL Oral Daily PRN Tharon Aquas, NP       methocarbamol (ROBAXIN) tablet 500 mg  500 mg Oral Q8H PRN Rosezetta Schlatter, MD       ondansetron (ZOFRAN-ODT) disintegrating tablet 4 mg  4 mg Oral Q6H PRN Rosezetta Schlatter, MD       traZODone (DESYREL) tablet 50 mg  50 mg Oral QHS PRN Tharon Aquas, NP       triamcinolone cream (KENALOG) 0.5 % 1 Application  1 Application Topical BID Rosezetta Schlatter, MD   1 Application at 29/79/89 1010   vortioxetine HBr (TRINTELLIX) tablet 10 mg  10 mg Oral Daily Rosezetta Schlatter, MD   10 mg at 09/04/22 1006   Current Outpatient Medications  Medication Sig Dispense Refill   acetaminophen (TYLENOL) 500 MG tablet Take 1,000 mg by mouth every 6 (six) hours as needed (For hip pain or fever.).     acyclovir (ZOVIRAX) 400 MG tablet Take 400 mg by mouth 3 (three) times daily.     amLODipine (NORVASC) 10 MG tablet Take 1 tablet (10 mg total) by mouth daily. Needs to keep appt for further refills 15 tablet 0   clobetasol ointment (TEMOVATE) 2.11 % Apply 1 Application topically 2 (two) times daily. Apply to affected area.     gabapentin (NEURONTIN) 300 MG capsule Take 300 mg by mouth 4 (four) times daily as needed (For  pain).     mupirocin ointment (BACTROBAN) 2 % Apply 1 Application topically 3 (three) times daily. Apply to affected area.     triamcinolone cream (KENALOG) 0.5 % Apply 1 Application topically 2 (two) times daily. Apply sparingly to affected area.     TRINTELLIX 10 MG TABS tablet Take 10 mg by mouth daily.     hydrOXYzine (ATARAX) 25 MG tablet Take 1 tablet (25 mg total) by mouth 3 (three) times daily as needed for anxiety. 30 tablet 0   [START ON 09/05/2022] ketoconazole (NIZORAL) 2 % shampoo Apply topically daily. 120 mL 0   traZODone (DESYREL) 50 MG tablet Take 1 tablet (50 mg total) by mouth at bedtime as needed for sleep. 30 tablet 0    PTA Medications: (Not in a hospital admission)      09/01/2022    8:48 AM 11/11/2020    1:18 PM 01/02/2018    9:51 AM  Depression screen PHQ 2/9  Decreased Interest '3 3 1  '$ Down, Depressed, Hopeless '2 2 2  '$ PHQ - 2 Score '5 5 3  '$ Altered sleeping '1 2 3  '$ Tired, decreased energy '3 3 3  '$ Change in appetite 0 1 3  Feeling bad or failure about yourself  '1 3 3  '$ Trouble concentrating '1 3 3  '$ Moving slowly or fidgety/restless 1 3 0  Suicidal thoughts 0 0 1  PHQ-9 Score '12 20 19  '$ Difficult doing work/chores Very difficult Very difficult Extremely dIfficult    Flowsheet Row ED from 09/01/2022 in [facility] from 08/13/2022 in [facility] to Hosp-Admission (Discharged) from 01/02/2022 in [facility]  C-SSRS RISK CATEGORY No Risk No Risk No Risk       Musculoskeletal  Strength & Muscle Tone: within normal limits Gait & Station: normal Patient leans: N/A  Psychiatric Specialty Exam  Presentation  General Appearance:  Appropriate for Environment; Casual  Eye Contact: Good  Speech: Clear and Coherent  Speech Volume: Normal  Handedness: Right   Mood and Affect  Mood: Anxious  Affect: Appropriate   Thought Process  Thought  Processes: Goal Directed  Descriptions of Associations:Intact  Orientation:Full (Time, Place and Person)  Thought Content:Logical  Diagnosis of Schizophrenia or Schizoaffective disorder in past: No    Hallucinations:Hallucinations: None  Ideas of Reference:None  Suicidal Thoughts:Suicidal Thoughts: No  Homicidal Thoughts:Homicidal Thoughts: No   Sensorium  Memory: Immediate Good; Recent Fair  Judgment: Good  Insight: Fair   Community education officer  Concentration: Good  Attention Span: Good  Recall: Good  Fund of Knowledge: Good  Language: Good   Psychomotor Activity  Psychomotor Activity: Psychomotor Activity: Normal   Assets  Assets: Communication Skills; Desire for Improvement; Housing; Resilience; Social Support   Sleep  Sleep: Sleep: Good   No data recorded  Physical Exam  Physical Exam HENT:     Head: Normocephalic and atraumatic.  Pulmonary:     Effort: Pulmonary effort is normal.  Neurological:     Mental Status: She is alert and oriented to person, place, and time.    Review of Systems  Psychiatric/Behavioral:  Negative for depression, hallucinations and suicidal ideas. The patient is nervous/anxious. The patient does not have insomnia.    Blood pressure 139/65, pulse 70, temperature 97.9 F (36.6 C), temperature source Oral, resp. rate 18, last menstrual period 09/20/2010, SpO2 96 %. There is no height or weight on file to calculate BMI.  Demographic Factors:  NA  Loss Factors: NA  Historical Factors: NA  Risk Reduction Factors:   Sense of responsibility to family  Continued Clinical Symptoms:  Previous Psychiatric Diagnoses and Treatments  Cognitive Features That Contribute To Risk:  None    Suicide Risk:  Minimal: No identifiable suicidal ideation.  Patients presenting with no risk factors but with morbid ruminations; may be classified as minimal risk based on the severity of the depressive symptoms  Plan Of  Care/Follow-up recommendations:  Follow up recommendations: - Activity as tolerated. - Diet as recommended by PCP. - Keep all scheduled follow-up appointments as recommended.   Disposition: Home with aunt PGY-3 Freida Busman, MD 09/04/2022, 10:44 AM

## 2022-09-04 NOTE — ED Notes (Signed)
Pt sleeping in no acute distress. RR even and unlabored. Environment secured. Will continue to monitor for safety. 

## 2022-09-04 NOTE — ED Notes (Signed)
Notified patient breakfast is been served

## 2022-09-04 NOTE — ED Notes (Signed)
Patient A&Ox4. Denies intent to harm self/others when asked. Denies A/VH. Patient denies any physical complaints when asked. No acute distress noted. Continue to ambulate with cane. Denies additional assistance with ambulation. Support and encouragement provided. Routine safety checks conducted according to facility protocol. Encouraged patient to notify staff if thoughts of harm toward self or others arise. Patient verbalize understanding and agreement. Will continue to monitor for safety.

## 2022-09-04 NOTE — ED Notes (Signed)
Pt is in the bed sleeping. Respirations are even and unlabored. No acute distress noted. Will continue to monitor for safety. 

## 2022-09-04 NOTE — ED Notes (Signed)
Patient A&O x 4, ambulatory. Patient discharged in no acute distress. Patient denied SI/HI, A/VH upon discharge. Patient verbalized understanding of all discharge instructions explained by staff, to include follow up appointments, RX's and safety plan. Patient reported mood 10/10.  Pt belongings returned to patient from locker #28 intact. Patient escorted to lobby via staff for transport to home via family. Safety maintained.

## 2022-09-08 ENCOUNTER — Other Ambulatory Visit: Payer: Self-pay | Admitting: Family Medicine

## 2022-09-08 DIAGNOSIS — R109 Unspecified abdominal pain: Secondary | ICD-10-CM

## 2022-09-18 ENCOUNTER — Other Ambulatory Visit: Payer: Self-pay

## 2022-09-18 ENCOUNTER — Emergency Department
Admission: EM | Admit: 2022-09-18 | Discharge: 2022-09-18 | Disposition: A | Discharge status: Home / Self Care | Payer: Medicare Other | Attending: Emergency Medicine | Admitting: Emergency Medicine

## 2022-09-18 DIAGNOSIS — F1414 Cocaine abuse with cocaine-induced mood disorder: Secondary | ICD-10-CM | POA: Insufficient documentation

## 2022-09-18 DIAGNOSIS — I10 Essential (primary) hypertension: Secondary | ICD-10-CM | POA: Diagnosis not present

## 2022-09-18 DIAGNOSIS — F4010 Social phobia, unspecified: Secondary | ICD-10-CM | POA: Diagnosis not present

## 2022-09-18 DIAGNOSIS — F1320 Sedative, hypnotic or anxiolytic dependence, uncomplicated: Secondary | ICD-10-CM | POA: Diagnosis present

## 2022-09-18 DIAGNOSIS — Z853 Personal history of malignant neoplasm of breast: Secondary | ICD-10-CM | POA: Insufficient documentation

## 2022-09-18 DIAGNOSIS — F1924 Other psychoactive substance dependence with psychoactive substance-induced mood disorder: Secondary | ICD-10-CM | POA: Diagnosis not present

## 2022-09-18 DIAGNOSIS — F3341 Major depressive disorder, recurrent, in partial remission: Secondary | ICD-10-CM | POA: Diagnosis not present

## 2022-09-18 DIAGNOSIS — F1994 Other psychoactive substance use, unspecified with psychoactive substance-induced mood disorder: Secondary | ICD-10-CM

## 2022-09-18 DIAGNOSIS — Z20822 Contact with and (suspected) exposure to covid-19: Secondary | ICD-10-CM | POA: Insufficient documentation

## 2022-09-18 DIAGNOSIS — F1410 Cocaine abuse, uncomplicated: Secondary | ICD-10-CM | POA: Diagnosis not present

## 2022-09-18 DIAGNOSIS — R4182 Altered mental status, unspecified: Secondary | ICD-10-CM | POA: Diagnosis present

## 2022-09-18 DIAGNOSIS — F22 Delusional disorders: Secondary | ICD-10-CM | POA: Diagnosis not present

## 2022-09-18 DIAGNOSIS — R443 Hallucinations, unspecified: Secondary | ICD-10-CM

## 2022-09-18 LAB — COMPREHENSIVE METABOLIC PANEL
ALT: 19 U/L (ref 0–44)
AST: 30 U/L (ref 15–41)
Albumin: 4.5 g/dL (ref 3.5–5.0)
Alkaline Phosphatase: 55 U/L (ref 38–126)
Anion gap: 9 (ref 5–15)
BUN: 14 mg/dL (ref 6–20)
CO2: 25 mmol/L (ref 22–32)
Calcium: 10 mg/dL (ref 8.9–10.3)
Chloride: 103 mmol/L (ref 98–111)
Creatinine, Ser: 0.83 mg/dL (ref 0.44–1.00)
GFR, Estimated: >60 mL/min (ref >60)
Glucose, Bld: 113 mg/dL — ABNORMAL HIGH (ref 70–99)
Potassium: 3.5 mmol/L (ref 3.5–5.1)
Sodium: 137 mmol/L (ref 135–145)
Total Bilirubin: 0.7 mg/dL (ref 0.3–1.2)
Total Protein: 8.1 g/dL (ref 6.5–8.1)

## 2022-09-18 LAB — RESP PANEL BY RT-PCR (RSV, FLU A&B, COVID)  RVPGX2
Influenza A by PCR: NEGATIVE
Influenza B by PCR: NEGATIVE
Resp Syncytial Virus by PCR: NEGATIVE
SARS Coronavirus 2 by RT PCR: NEGATIVE

## 2022-09-18 LAB — CBC
HCT: 33.4 % — ABNORMAL LOW (ref 36.0–46.0)
Hemoglobin: 10.6 g/dL — ABNORMAL LOW (ref 12.0–15.0)
MCH: 25 pg — ABNORMAL LOW (ref 26.0–34.0)
MCHC: 31.7 g/dL (ref 30.0–36.0)
MCV: 78.8 fL — ABNORMAL LOW (ref 80.0–100.0)
Platelets: 293 10*3/uL (ref 150–400)
RBC: 4.24 MIL/uL (ref 3.87–5.11)
RDW: 15.9 % — ABNORMAL HIGH (ref 11.5–15.5)
WBC: 6.8 10*3/uL (ref 4.0–10.5)
nRBC: 0 % (ref 0.0–0.2)

## 2022-09-18 LAB — SALICYLATE LEVEL: Salicylate Lvl: <7 mg/dL — ABNORMAL LOW (ref 7.0–30.0)

## 2022-09-18 LAB — URINE DRUG SCREEN, QUALITATIVE (ARMC ONLY)
Amphetamines, Ur Screen: NOT DETECTED
Barbiturates, Ur Screen: NOT DETECTED
Benzodiazepine, Ur Scrn: POSITIVE — AB
Cannabinoid 50 Ng, Ur ~~LOC~~: NOT DETECTED
Cocaine Metabolite,Ur ~~LOC~~: POSITIVE — AB
MDMA (Ecstasy)Ur Screen: NOT DETECTED
Methadone Scn, Ur: NOT DETECTED
Opiate, Ur Screen: NOT DETECTED
Phencyclidine (PCP) Ur S: NOT DETECTED
Tricyclic, Ur Screen: NOT DETECTED

## 2022-09-18 LAB — ACETAMINOPHEN LEVEL: Acetaminophen (Tylenol), Serum: <10 ug/mL — ABNORMAL LOW (ref 10–30)

## 2022-09-18 LAB — ETHANOL: Alcohol, Ethyl (B): <10 mg/dL (ref <10)

## 2022-09-18 LAB — PREGNANCY, URINE: Preg Test, Ur: NEGATIVE

## 2022-09-18 MED ORDER — LORAZEPAM 2 MG PO TABS
2.0000 mg | ORAL_TABLET | Freq: Once | ORAL | Status: AC
  Administered 2022-09-18: 2 mg via ORAL
  Filled 2022-09-18: qty 1

## 2022-09-18 MED ORDER — OXYMETAZOLINE HCL 0.05 % NA SOLN
1.0000 | Freq: Once | NASAL | Status: AC
  Administered 2022-09-18: 1 via NASAL
  Filled 2022-09-18: qty 30

## 2022-09-18 MED ORDER — IBUPROFEN 600 MG PO TABS
600.0000 mg | ORAL_TABLET | Freq: Once | ORAL | Status: AC
  Administered 2022-09-18: 600 mg via ORAL
  Filled 2022-09-18: qty 1

## 2022-09-18 MED ORDER — GABAPENTIN 100 MG PO CAPS: 100.0000 mg | ORAL_CAPSULE | Freq: Three times a day (TID) | ORAL | Status: DC

## 2022-09-18 MED ORDER — GABAPENTIN 300 MG PO CAPS
300.0000 mg | ORAL_CAPSULE | Freq: Three times a day (TID) | ORAL | Status: DC
  Administered 2022-09-18: 300 mg via ORAL
  Filled 2022-09-18: qty 1

## 2022-09-18 NOTE — ED Notes (Signed)
TTS consult with inhouse psychiatry at this time at the bedside

## 2022-09-18 NOTE — ED Notes (Signed)
ED Provider at bedside. 

## 2022-09-18 NOTE — ED Provider Notes (Signed)
-----------------------------------------   9:35 AM on 09/18/2022 -----------------------------------------  NP Lord from psychiatry has evaluated the patient and rescinded the IVC.  I have completed the notice of commitment change.  The patient is cleared for discharge.  She does not demonstrate any acute danger to self or others.  Discharge instructions and return precautions provided.   Arta Silence, MD 09/18/22 [phone]
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
## 2022-09-18 NOTE — ED Notes (Signed)
Pt arrived to [facility] 23 from triage with [employer] and triage RN, steady gait noted, pt is in hospital required scrubs, security officer wanded pt for safety precautions before entering treatment room.

## 2022-09-18 NOTE — ED Notes (Signed)
This tech provided patient with ice chips and warm blanket per pts request. Pt is calm and cooperative and has no other needs at this time.

## 2022-09-18 NOTE — ED Notes (Signed)
Received report from night RN. Pt resting. Security sitting in [place] hallway.

## 2022-09-18 NOTE — Progress Notes (Signed)
Client continues to deny suicidal ideations along with homicidal ideations, hallucinations, and paranoia.  She is having some leg cramps related to withdrawal symptoms, ibuprofen prescribed.  An appointment with [facility] on Tuesday for substance abuse IOP.  Waylan Boga, PMHNP

## 2022-09-18 NOTE — ED Notes (Signed)
Pt ambulatory to BR, has specimen cup for possible UA.
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
## 2022-09-18 NOTE — ED Notes (Signed)
Patient belongings as follows:  1 pair pants 1 shirt 1 pair socks 1 pair shoes 1 set keys 1 phone 1 black jacket  Glasses remain with pt
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
## 2022-09-21 ENCOUNTER — Ambulatory Visit: Admission: RE | Admit: 2022-09-21 | Payer: Medicare Other | Source: Ambulatory Visit

## 2022-09-23 ENCOUNTER — Ambulatory Visit: Admission: RE | Admit: 2022-09-23 | Payer: Medicare Other | Source: Ambulatory Visit

## 2022-09-27 ENCOUNTER — Ambulatory Visit
Admission: RE | Admit: 2022-09-27 | Discharge: 2022-09-27 | Disposition: A | Discharge status: Home / Self Care | Payer: Medicare Other | Source: Ambulatory Visit | Attending: Family Medicine | Admitting: Family Medicine

## 2022-09-27 DIAGNOSIS — R109 Unspecified abdominal pain: Secondary | ICD-10-CM

## 2022-09-27 MED ORDER — IOHEXOL 300 MG/ML  SOLN
100.0000 mL | Freq: Once | INTRAMUSCULAR | Status: AC | PRN
  Administered 2022-09-27: 100 mL via INTRAVENOUS

## 2022-10-13 ENCOUNTER — Telehealth: Payer: Medicare Other

## 2022-10-13 ENCOUNTER — Telehealth: Payer: Medicare Other | Admitting: Family

## 2022-10-13 DIAGNOSIS — F22 Delusional disorders: Secondary | ICD-10-CM | POA: Diagnosis not present

## 2022-10-13 DIAGNOSIS — L219 Seborrheic dermatitis, unspecified: Secondary | ICD-10-CM | POA: Diagnosis not present

## 2022-10-13 NOTE — Patient Instructions (Signed)
Seborrheic Dermatitis, Adult Seborrheic dermatitis is a skin disease that causes red, scaly patches. It often occurs on the scalp, where it may be called dandruff. The patches may also appear on other parts of the body. Skin patches tend to occur where there are a lot of oil glands in the skin. Areas of the body that may be affected include: The scalp. The face, eyebrows, and ears. The area around a beard. Skin folds of the body. This includes the armpits, groin, and buttocks. The chest. The condition is often long-lasting (chronic). It may come and go for no known reason. It may be activated by a trigger, such as: Cold weather. Being out in the sun. Stress. Drinking alcohol. What are the causes? The cause of this condition is not known. It may be related to having too much yeast on the skin or changes in how your body's disease-fighting system (immune system) works. What increases the risk? You may be more likely to develop this condition if: You have a weak immune system. You are 50 years old or older. You have other conditions, such as: Human immunodeficiency virus (HIV) or acquired immunodeficiency virus (AIDS). Parkinson's disease. Mood disorders, such as depression. Liver problems. Obesity. What are the signs or symptoms? Symptoms of this condition include: Thick scales on the scalp. Redness on the face or in the armpits. Skin that is flaky. The flakes may be white or yellow. Skin that seems oily or dry but is not helped with moisturizers. Itching or burning in the affected areas. How is this diagnosed? This condition is diagnosed with a medical history and physical exam. A sample of your skin may be tested (skin biopsy). You may need to see a skin specialist (dermatologist). How is this treated? There is no cure for this condition, but treatment can help to manage the symptoms. You may get treatment to remove scales, lower the risk of skin infection, and reduce swelling or  itching. Treatment may include: Medicated shampoos, moisturizing creams, or ointments. Creams that reduce skin yeast. Creams that reduce swelling and irritation (steroids). Follow these instructions at home: Skin care Use any medicated shampoo, skin creams, or ointments only as told by your health care provider. Do not use skin products that contain alcohol. Take lukewarm baths or showers. Avoid very hot water. When you are outside, wear a hat and clothes that block UV light. General instructions Apply over-the-counter and prescription medicines only as told by your health care provider. Learn what triggers your symptoms so you can avoid these things. Use techniques for stress reduction, such as meditation or yoga. Do not drink alcohol if your health care provider tells you not to drink. Keep all follow-up visits. Your health care provider will check your skin to make sure the treatments are helping. Where to find more information American Academy of Dermatology: aad.org Contact a health care provider if: Your symptoms do not get better with treatment. Your symptoms get worse. You have new symptoms. Get help right away if: Your condition quickly gets worse, even with treatment. This information is not intended to replace advice given to you by your health care provider. Make sure you discuss any questions you have with your health care provider. Document Revised: 02/05/2022 Document Reviewed: 02/05/2022 Elsevier Patient Education  2023 [employer]  
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
## 2022-10-14 ENCOUNTER — Telehealth: Payer: Medicare Other

## 2022-10-18 ENCOUNTER — Ambulatory Visit: Payer: Medicare Other | Admitting: Dermatology

## 2022-10-27 ENCOUNTER — Encounter: Payer: Self-pay | Admitting: Dermatology

## 2022-10-27 ENCOUNTER — Ambulatory Visit (INDEPENDENT_AMBULATORY_CARE_PROVIDER_SITE_OTHER): Payer: Medicare Other | Admitting: Dermatology

## 2022-10-27 ENCOUNTER — Other Ambulatory Visit: Payer: Self-pay | Admitting: Dermatology

## 2022-10-27 DIAGNOSIS — B353 Tinea pedis: Secondary | ICD-10-CM | POA: Diagnosis not present

## 2022-10-27 DIAGNOSIS — L299 Pruritus, unspecified: Secondary | ICD-10-CM

## 2022-10-27 DIAGNOSIS — L821 Other seborrheic keratosis: Secondary | ICD-10-CM

## 2022-10-27 DIAGNOSIS — L309 Dermatitis, unspecified: Secondary | ICD-10-CM | POA: Diagnosis not present

## 2022-10-27 MED ORDER — KETOCONAZOLE 2 % EX SHAM: MEDICATED_SHAMPOO | CUTANEOUS | 2 refills | Status: DC

## 2022-10-27 MED ORDER — FLUOCINOLONE ACETONIDE BODY 0.01 % EX OIL: TOPICAL_OIL | CUTANEOUS | 2 refills | Status: DC

## 2022-10-27 NOTE — Patient Instructions (Addendum)
Start terbinafine cream twice a day to feet and between toes until rash clear and for one more week  Start fluocinolone oil 1-2 times daily to scalp as needed for itch.  Continue ketoconazole 2% shampoo apply once weekly, massage into scalp and leave in for 10 minutes before rinsing out  Topical steroids (such as triamcinolone, fluocinolone, fluocinonide, mometasone, clobetasol, halobetasol, betamethasone, hydrocortisone) can cause thinning and lightening of the skin if they are used for too long in the same area. Your physician has selected the right strength medicine for your problem and area affected on the body. Please use your medication only as directed by your physician to prevent side effects.   Due to recent changes in healthcare laws, you may see results of your pathology and/or laboratory studies on MyChart before the doctors have had a chance to review them. We understand that in some cases there may be results that are confusing or concerning to you. Please understand that not all results are received at the same time and often the doctors may need to interpret multiple results in order to provide you with the best plan of care or course of treatment. Therefore, we ask that you please give [place] 2 business days to thoroughly review all your results before contacting the office for clarification. Should we see a critical lab result, you will be contacted sooner.   If You Need Anything After Your Visit  If you have any questions or concerns for your doctor, please call our main line at [phone] and press option 4 to reach your doctor's medical assistant. If no one answers, please leave a voicemail as directed and we will return your call as soon as possible. Messages left after 4 pm will be answered the following business day.   You may also send [place] a message via [place]. We typically respond to MyChart messages within 1-2 business days.  For prescription refills, please ask your pharmacy  to contact our office. Our fax number is [phone].  If you have an urgent issue when the clinic is closed that cannot wait until the next business day, you can page your doctor at the number below.    Please note that while we do our best to be available for urgent issues outside of office hours, we are not available 24/7.   If you have an urgent issue and are unable to reach [place], you may choose to seek medical care at your doctor's office, retail clinic, urgent care center, or emergency room.  If you have a medical emergency, please immediately call 911 or go to the emergency department.  Pager Numbers  - Dr. Nehemiah Massed: [phone]  - Dr. Laurence Ferrari: [phone]  - Dr. Nicole Kindred: [phone]  In the event of inclement weather, please call our main line at [phone] for an update on the status of any delays or closures.  Dermatology Medication Tips: Please keep the boxes that topical medications come in in order to help keep track of the instructions about where and how to use these. Pharmacies typically print the medication instructions only on the boxes and not directly on the medication tubes.   If your medication is too expensive, please contact our office at [phone] option 4 or send [place] a message through [place].   We are unable to tell what your co-pay for medications will be in advance as this is different depending on your insurance coverage. However, we may be able to find a substitute medication at lower cost or fill  out paperwork to get insurance to cover a needed medication.   If a prior authorization is required to get your medication covered by your insurance company, please allow [place] 1-2 business days to complete this process.  Drug prices often vary depending on where the prescription is filled and some pharmacies may offer cheaper prices.  The website www.goodrx.com contains coupons for medications through different pharmacies. The prices here do not account for  what the cost may be with help from insurance (it may be cheaper with your insurance), but the website can give you the price if you did not use any insurance.  - You can print the associated coupon and take it with your prescription to the pharmacy.  - You may also stop by our office during regular business hours and pick up a GoodRx coupon card.  - If you need your prescription sent electronically to a different pharmacy, notify our office through [facility] or by phone at [phone] option 4.     Si Usted Necesita Algo Despus de Su Visita  Tambin puede enviarnos un mensaje a travs de Pharmacist, community. Por lo general respondemos a los mensajes de MyChart en el transcurso de 1 a 2 das hbiles.  Para renovar recetas, por favor pida a su farmacia que se ponga en contacto con nuestra oficina. Harland Dingwall de fax es [place] [facility] [phone].  Si tiene un asunto urgente cuando la clnica est cerrada y que no puede esperar hasta el siguiente da hbil, puede llamar/localizar a su doctor(a) al nmero que aparece a continuacin.   Por favor, tenga en cuenta que aunque hacemos todo lo posible para estar disponibles para asuntos urgentes fuera del horario de [place], no estamos disponibles las 24 horas del da, los 7 das de la [place].   Si tiene un problema urgente y no puede comunicarse con nosotros, puede optar por buscar atencin mdica  en el consultorio de su doctor(a), en una clnica privada, en un centro de atencin urgente o en una sala de emergencias.  Si tiene Engineering geologist, por favor llame inmediatamente al 911 o vaya a la sala de emergencias.  Nmeros de bper  - Dr. Nehemiah Massed: [phone]  - Dra. Moye: [phone]  - Dra. Nicole Kindred: [phone]  En caso de inclemencias del [place], por favor llame a Johnsie Kindred principal al [phone] para una actualizacin sobre el [place] de cualquier retraso o cierre.  Consejos para la medicacin en dermatologa: Por favor, guarde  las cajas en las que vienen los medicamentos de uso tpico para ayudarle a seguir las instrucciones sobre dnde y cmo usarlos. Las farmacias generalmente imprimen las instrucciones del medicamento slo en las cajas y no directamente en los tubos del [place].   Si su medicamento es muy caro, por favor, pngase en contacto con Zigmund Daniel llamando al [phone] y presione la opcin 4 o envenos un mensaje a travs de Pharmacist, community.   No podemos decirle cul ser su copago por los medicamentos por adelantado ya que esto es diferente dependiendo de la cobertura de su seguro. Sin embargo, es posible que podamos encontrar un medicamento sustituto a Electrical engineer un formulario para que el seguro cubra el medicamento que se considera necesario.   Si se requiere una autorizacin previa para que su compaa de seguros [place] su medicamento, por favor permtanos de 1 a 2 das hbiles para completar este proceso.  Los precios de los medicamentos varan con frecuencia dependiendo del Environmental consultant de dnde se surte la receta y  alguna farmacias pueden ofrecer precios ms baratos.  El sitio web www.goodrx.com tiene cupones para medicamentos de Airline pilot. Los precios aqu no tienen en cuenta lo que podra costar con la ayuda del seguro (puede ser ms barato con su seguro), pero el sitio web puede darle el precio si no utiliz Research scientist (physical sciences).  - Puede imprimir el cupn correspondiente y llevarlo con su receta a la farmacia.  - Tambin puede pasar por nuestra oficina durante el horario de atencin regular y Charity fundraiser una tarjeta de cupones de GoodRx.  - Si necesita que su receta se enve electrnicamente a una farmacia diferente, informe a nuestra oficina a travs de MyChart de [place] o por telfono llamando al [phone] y presione la opcin 4.

## 2022-10-27 NOTE — Progress Notes (Signed)
   New Patient Visit  Subjective  Penny Kim is a 52 y.o. female who presents for the following: Rash (Patient advises constant itching all over skin, not just scalp. She has used hydroxyzine for itch and ketoconazole 2% shampoo but the shampoo did not seem to help. She also has used TMC 0.5% cream at foot. Irritation at eyes, thrush at tongue.).  Patient thinks it could be related to dust coming out of HVAC. No new products or medications. Patient has photos of affected areas at body. Patient advises she has been itchy for months and that there are no areas at body that do not itch. She has been treated for psoriasis in the past.   The following portions of the chart were reviewed this encounter and updated as appropriate:       Review of Systems:  No other skin or systemic complaints except as noted in HPI or Assessment and Plan.  Objective  Well appearing patient in no apparent distress; mood and affect are within normal limits.  A focused examination was performed including face, neck, chest and back and scalp, feet. Relevant physical exam findings are noted in the Assessment and Plan.  scalp Mild erythema and scale at scalp  Right Foot Scaling and maceration web spaces and over distal and lateral soles.  KOH+ for hyphal elements    Assessment & Plan  Dermatitis scalp  Chronic and persistent condition with duration or expected duration over one year. Condition is symptomatic/ bothersome to patient. Not currently at goal.  Seborrheic Dermatitis  -  is a chronic persistent rash characterized by pinkness and scaling most commonly of the mid face but also can occur on the scalp (dandruff), ears; mid chest, mid back and groin.  It tends to be exacerbated by stress and cooler weather.  People who have neurologic disease may experience new onset or exacerbation of existing seborrheic dermatitis.  The condition is not curable but treatable and can be controlled.  Start  fluocinolone oil 1-2 times daily to scalp as needed for itch.  Continue ketoconazole 2% shampoo apply once weekly, massage into scalp and leave in for 10 minutes before rinsing out   Fluocinolone Acetonide Body 0.01 % OIL - scalp Apply 1-2 times daily to affected areas at scalp as needed for itch.  ketoconazole (NIZORAL) 2 % shampoo - scalp apply once weekly, massage into scalp and leave in for 10 minutes before rinsing out  Tinea pedis of right foot Right Foot  Start terbinafine cream twice a day to feet and between toes until rash clear and for one more week  Will recheck at follow-up    Itch all over  Generalized for last couple of months per patient  Recommend patient follow up with with cancer center due to h/o breast cancer  Reviewed CBC with diff, CMP, TSH  Pt also with history of cocaine use  If itch not resolved by follow-up, may consider additional workup   Seborrheic Keratoses - Stuck-on, waxy, tan-brown papules and/or plaques  - Benign-appearing - Discussed benign etiology and prognosis. - Observe - Call for any changes  Return in about 2 months (around 12/26/2022) for tinea, scalp.  Graciella Belton, RMA, am acting as scribe for Forest Gleason, MD .  Documentation: I have reviewed the above documentation for accuracy and completeness, and I agree with the above.  Forest Gleason, MD

## 2022-10-28 ENCOUNTER — Other Ambulatory Visit: Payer: Self-pay | Admitting: Dermatology

## 2022-10-28 DIAGNOSIS — L309 Dermatitis, unspecified: Secondary | ICD-10-CM

## 2022-10-28 NOTE — Telephone Encounter (Signed)
Will submit PA to insurance.  Called pharmacy and got pharmacy benefit information.  BIN: [id number] PCN: [id number] GRP: [id number] ID: [id number]

## 2022-11-02 ENCOUNTER — Telehealth: Payer: Self-pay

## 2022-11-02 ENCOUNTER — Other Ambulatory Visit: Payer: Self-pay | Admitting: Dermatology

## 2022-11-02 MED ORDER — FLUOCINOLONE ACETONIDE SCALP 0.01 % EX OIL: 1.0000 | TOPICAL_OIL | CUTANEOUS | 1 refills | Status: DC

## 2022-11-02 NOTE — Telephone Encounter (Signed)
Body oil denied. Scalp oil sent in as replacement. aw

## 2022-11-03 ENCOUNTER — Telehealth: Payer: Self-pay

## 2022-11-03 NOTE — Telephone Encounter (Signed)
ERROR

## 2022-11-03 NOTE — Telephone Encounter (Signed)
Fluocinolone Body oil and scalp oil both denied by insurance. Preferred option is Fluocinolone Solution - OK to make switch?

## 2022-11-03 NOTE — Telephone Encounter (Signed)
Clobetasol solution preferred if covered and reasonable price. If not, fluocinolone solution is ok. Thank you!

## 2022-11-04 ENCOUNTER — Other Ambulatory Visit: Payer: Self-pay

## 2022-11-04 DIAGNOSIS — L219 Seborrheic dermatitis, unspecified: Secondary | ICD-10-CM

## 2022-11-04 MED ORDER — CLOBETASOL PROPIONATE 0.05 % EX SOLN: 1.0000 | CUTANEOUS | 0 refills | Status: DC

## 2022-11-09 NOTE — Telephone Encounter (Signed)
Penny Kim sent in Clobetasol Solution last week. aw

## 2022-11-23 ENCOUNTER — Inpatient Hospital Stay: Payer: Medicare Other | Admitting: Physician Assistant

## 2022-11-23 ENCOUNTER — Telehealth: Payer: Self-pay | Admitting: Physician Assistant

## 2022-11-23 ENCOUNTER — Encounter: Payer: Self-pay | Admitting: *Deleted

## 2022-11-23 NOTE — Telephone Encounter (Signed)
Patient called to reschedule appointment due to feeling under the weather. Patient rescheduled and notified.

## 2022-11-30 ENCOUNTER — Encounter: Payer: Self-pay | Admitting: *Deleted

## 2022-12-07 ENCOUNTER — Encounter: Payer: Self-pay | Admitting: Dermatology

## 2022-12-07 ENCOUNTER — Ambulatory Visit (INDEPENDENT_AMBULATORY_CARE_PROVIDER_SITE_OTHER): Payer: Medicare Other | Admitting: Dermatology

## 2022-12-07 ENCOUNTER — Inpatient Hospital Stay: Payer: Medicare Other | Attending: Physician Assistant | Admitting: Physician Assistant

## 2022-12-07 VITALS — BP 97/74 | HR 94

## 2022-12-07 VITALS — BP 115/74 | HR 75 | Temp 97.3°F | Resp 12 | Wt 118.3 lb

## 2022-12-07 DIAGNOSIS — Z1211 Encounter for screening for malignant neoplasm of colon: Secondary | ICD-10-CM | POA: Diagnosis not present

## 2022-12-07 DIAGNOSIS — R5383 Other fatigue: Secondary | ICD-10-CM | POA: Insufficient documentation

## 2022-12-07 DIAGNOSIS — Z9013 Acquired absence of bilateral breasts and nipples: Secondary | ICD-10-CM | POA: Insufficient documentation

## 2022-12-07 DIAGNOSIS — Z79810 Long term (current) use of selective estrogen receptor modulators (SERMs): Secondary | ICD-10-CM | POA: Insufficient documentation

## 2022-12-07 DIAGNOSIS — B353 Tinea pedis: Secondary | ICD-10-CM

## 2022-12-07 DIAGNOSIS — L905 Scar conditions and fibrosis of skin: Secondary | ICD-10-CM | POA: Diagnosis not present

## 2022-12-07 DIAGNOSIS — L309 Dermatitis, unspecified: Secondary | ICD-10-CM | POA: Diagnosis not present

## 2022-12-07 DIAGNOSIS — C50211 Malignant neoplasm of upper-inner quadrant of right female breast: Secondary | ICD-10-CM | POA: Diagnosis not present

## 2022-12-07 DIAGNOSIS — Z170 Estrogen receptor positive status [ER+]: Secondary | ICD-10-CM | POA: Diagnosis not present

## 2022-12-07 DIAGNOSIS — Z79818 Long term (current) use of other agents affecting estrogen receptors and estrogen levels: Secondary | ICD-10-CM

## 2022-12-07 MED ORDER — FLUOCINOLONE ACETONIDE SCALP 0.01 % EX OIL: 1.0000 | TOPICAL_OIL | CUTANEOUS | 1 refills | Status: DC

## 2022-12-07 MED ORDER — CICLOPIROX OLAMINE 0.77 % EX CREA: TOPICAL_CREAM | Freq: Two times a day (BID) | CUTANEOUS | 1 refills | Status: DC

## 2022-12-07 NOTE — Progress Notes (Signed)
CLINIC:  Survivorship   REASON FOR VISIT:  Routine follow-up post-treatment for history of breast cancer.  BRIEF ONCOLOGIC HISTORY:  Oncology History  Malignant neoplasm of upper-inner quadrant of right breast in female, estrogen receptor positive ([place])  04/28/2010 Initial Biopsy   FINAL DIAGNOSIS   Microscopic Examination and Diagnosis    1. BREAST, RIGHT, NEEDLE CORE BIOPSY, : - DUCTAL CARCINOMA WITH ASSOCIATED TUMOR NECROSIS AND CALCIFICATIONS    MICROSCOPIC DESCRIPTION   1. There are detached fragments of ductal carcinoma with no surrounding stroma PRESENT to evaluate for stromal invasion. 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  Prognostic indicators - acis   Interpretation   This in situ carcinoma is positive for estrogen receptor and positive for progesterone receptor expression.    Results   Immunohistochemical and morphometric analysis by the automated cellular imaging system (acis)   Estrogen receptor (negative, <1%): 100%, positive   Progesterone receptor (negative, <1%): 57, positive   05/04/2010 Imaging   Clinical Data: The patient was recently diagnosed with ductal carcinoma in situ following stereotactic guided core biopsy of calcifications in the lower inner quadrant of the right breast.   BILATERAL BREAST MRI WITH AND WITHOUT CONTRAST   IMPRESSION:     1.  Large area of nonmass enhancement in the medial aspect of the right breast, consistent with the recent diagnosis of ductal carcinoma in situ.  2.  More discrete mass-like enhancement measuring 1.7 cm approximately 4 cm anterior to the clip placed at the time of recent stereotactic guided core biopsy.  Biopsy of this mass is recommended to determine extent of disease if eventual breast conservation may be desired.  Ultrasound may be helpful for  identification of this mass. If no  ultrasound correlate is identified, MR guided core biopsy is recommended.    05/05/2010 Pathology Results   FINAL DIAGNOSIS    1. Breast, right, needle core biopsy, :   - INVASIVE DUCTAL CARCINOMA.   - DUCTAL CARCINOMA IN SITU.   MICROSCOPIC DESCRIPTION   1. Although grade is best determined at time of surgical excision, the carcinoma appears Grade II.  PROGNOSTIC INDICATORS - ACIS   Interpretation   This invasive carcinoma is positive for estrogen receptor and positive for progesterone receptor expression and demonstrates  an unfavorable proliferation rate.    Results   IMMUNOHISTOCHEMICAL AND MORPHOMETRIC ANALYSIS BY THE AUTOMATED CELLULAR IMAGING SYSTEM (ACIS)   Estrogen Receptor (Negative, <1%): 78%, POSITIVE   Progesterone Receptor (Negative, <1%): 82%, POSITIVE   Proliferation Marker Ki67 by M IB-1 (Low<20%): 85%    06/19/2010 Definitive Surgery   FINAL DIAGNOSIS    1. Breast, simple mastectomy, left, with additional inferior flap :   - FIBROCYSTIC CHANGES.   - THERE IS NO EVIDENCE OF MALIGNANCY.    2. Lymph node, sentinel, biopsy, right axillary :   - THERE IS NO EVIDENCE OF CARCINOMA IN 1 OF 1 LYMPH NODE (0/1).    3. Lymph node, sentinel, biopsy, right axillary :   - THERE IS NO EVIDENCE OF CARCINOMA IN 1 OF 1 LYMPH NODE (0/1).    4. Lymph node, sentinel, biopsy, right axillary :   - THERE IS NO EVIDENCE OF CARCINOMA IN 1 OF 1 LYMPH NODE (0/1).    5. Lymph node, sentinel, biopsy, right axillary :   - THERE IS NO EVIDENCE OF CARCINOMA IN 1 OF 1 LYMPH NODE (0/1).  6. Breast, simple mastectomy, right :   - INVASIVE DUCTAL CARCINOMA, GRADE III/III, SPANNING 2.2 CM.   - DUCTAL CARCINOMA IN SITU WITH CALCIFICATIONS, INTERMEDIATE GRADE.   - THE SURGICAL RESECTION MARGINS APPEAR NEGATIVE FOR CARCINOMA.   - SEE ONCOLOGY TABLE BELOW.    07/17/2010 PET scan   IMPRESSION:  No evidence of residual or metastatic disease.    11/2010 - 01/2021 Anti-estrogen oral therapy    Tamoxifen -held early 2018 - January 2019 due to insurance issues   10/11/2016 Initial Diagnosis   Malignant neoplasm of upper-inner quadrant of right breast in female, estrogen receptor positive ([place])   06/27/2018 Genetic Testing   BRCA1/2 Deletion and Duplication Analysis with [facility] myRisk Update Test  GENETIC RESULT: NEGATIVE - NO CLINICALLY SIGNIFICANT MUTATION IDENTIFIED  Genes Analyzed: Unless otherwise noted sequencing and large rearrangement analyses were performed on the following genes: APC, ATM, AXIN2, BARD1, BMPR1A, BRCA1, BRCA2, BRIP1, CDH1, CDK4, CDKN2A, CHEK2, EPCAM (large rearrangement only), HOXB13 (sequencing only), GALNT12, MLH1, MSH2, MSH3 (excluding repetitive portions of exon 1), MSH6, MUTYH, NBN, NTHL1, PALB2, PMS2, PTEN, RAD51C, RAD51D, RNF43, RPS20, SMAD4, STK11, TP53. Sequencing was performed for select regions of POLE and POLD1, and large rearrangement analysis was performed for select regions of GREM1 (see technical specifications).     INTERVAL HISTORY:  Penny Kim presents to the [facility] today for our initial meeting to review her survivorship care plan detailing her treatment course for breast cancer, as well as monitoring long-term side effects of that treatment, education regarding health maintenance, screening, and overall wellness and health promotion.     Overall, Penny Kim reports that she is still struggling with several issues since completion of Tamoxifen therapy in May 2022. She continues to struggle with chronic fatigue that has been present for several years. She reports losing approximately 15 lbs over the last several months mainly due to difficulty affording food. She denies nausea, vomiting or abdominal pain. Her bowel habits are unchanged and she has a bowel movement every 2-3 days. She denies easy bruising or signs of active bleeding. She does struggle with vaginal dryness and low libido since undergoing her breast cancer therapy. She has  chronic hip pain and uses a cane to help with ambulation. She is under the care of orthopedic surgeon and plans to undergo hip replacement. She has skin irritation in her scalp that she is under the care of a dermatology. She denies fevers, chills, sweats, shortness of breath, chest pain or cough. She has no other complaints.     REVIEW OF SYSTEMS:  Review of Systems  Constitutional:  Positive for fatigue. Negative for appetite change and fever.  Respiratory:  Negative for cough and shortness of breath.   Cardiovascular:  Negative for chest pain and leg swelling.  Gastrointestinal:  Negative for abdominal pain, constipation, diarrhea, nausea and vomiting.  Musculoskeletal:  Positive for arthralgias.       Bilateral hip pain   Skin:  Positive for itching.  Neurological:  Negative for dizziness and headaches.  Psychiatric/Behavioral:  Negative for confusion and depression. The patient is not nervous/anxious.      ONCOLOGY TREATMENT TEAM:  1. Surgeon:  Dr. Dr. Donne Hazel at [facility] 2. Medical Oncologist: Dr. 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 TTE 10/2016: EF 50-55%, no RWMA, normal LV diastolic function, left atrium normal, RV systolic function normal, PASP normal   History of stress test    a. treadmill Myoview 11/2016: small defect of mild severity present in the apex location felt to be secondary to breast attenuation. EF 55-65%. Normal study   HTN (hypertension)    Orthostatic hypotension    Psoriasis     Past Surgical History:  Procedure Laterality Date   BREAST RECONSTRUCTION  01/06/2012   Procedure: BREAST RECONSTRUCTION;  Surgeon: Crissie Reese, MD;  Location: [facility];  Service: Plastics;  Laterality: Bilateral;  bilateral removal of tissue expanders, bilateral placement of implants--Breast   BREAST SURGERY  06/19/2010   exploration of right mastectomy site due to post-op bleeding   INSERTION OF TISSUE EXPANDER AFTER MASTECTOMY  06/19/2010   bilat. mastectomies with bilat. tissue expanders   PORT-A-CATH REMOVAL  11/26/2010   PORTACATH PLACEMENT  07/23/2010   TISSUE EXPANDER REMOVAL  10/20/2010   left     ALLERGIES:  Allergies  Allergen Reactions   Prochlorperazine Other (See Comments)    Syncope   Methadone Itching   Propranolol Rash   Tape Rash   Wound Dressing Adhesive Rash     CURRENT MEDICATIONS:  Outpatient Encounter Medications as of 12/07/2022  Medication Sig   acetaminophen (TYLENOL) 500 MG tablet Take 1,000 mg by mouth every 6 (six) hours as needed (For hip pain or fever.).   acyclovir (ZOVIRAX) 400 MG tablet Take 400 mg by mouth 3 (three) times daily.   amLODipine (NORVASC) 10 MG tablet Take 1 tablet (10 mg total) by mouth daily. Needs to keep appt for further refills   ciclopirox (LOPROX) 0.77 % cream Apply topically 2 (two) times daily. To scaly rash at feet   clobetasol (TEMOVATE) 0.05 % external solution Apply 1 Application topically as directed. Qd-bid prn itch   Fluocinolone Acetonide Scalp 0.01 % OIL Apply 1 Application topically as directed. Apply 1-2 daily as needed for itchy scalp   gabapentin (NEURONTIN) 300 MG capsule Take 300 mg by mouth 4 (four) times daily as needed (For pain).   hydrOXYzine (ATARAX) 25 MG tablet Take 1 tablet (25 mg total) by mouth 3 (three) times daily as needed for anxiety.   ketoconazole (NIZORAL) 2 % shampoo Apply topically daily.   ketoconazole (NIZORAL) 2 % shampoo apply once weekly, massage into scalp and leave  in for 10 minutes before rinsing out   mupirocin ointment (BACTROBAN) 2 % Apply 1 Application topically 3 (three) times daily. Apply to affected area.   traZODone (DESYREL) 50 MG tablet Take 1 tablet (50 mg total) by mouth at bedtime as needed for sleep.   TRINTELLIX 10 MG TABS tablet Take 10 mg by mouth daily.   No facility-administered encounter medications on file as of 12/07/2022.     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  The remainder of the BRCA1 and BRCA2 genes were negative Through [facility] genetics.  No BART.    Updated genetic testing performed 10/16/2019[facility] panel.  The [facility] gene panel offered by [employer] includes sequencing and deletion/duplication testing of the following 35 genes: APC, ATM, BARD1, BMPR1A, BRCA1, BRCA2, BRIP1, CHD1, CDK4, CDKN2A, CHEK2, EPCAM (large rearrangement only), GREM1, HOXB13, AXIN2, MSH3, NTHL1, RNF43, GALNT12, RSP20, MLH1, MSH2, MSH6, MUTYH, NBN, PALB2, PMS2, PTEN, RAD51C, RAD51D, SMAD4, STK11, and TP53. Sequencing was performed for select regions of POLE and POLD1, and large rearrangement analysis was performed for select regions of GREM1.   Results:  Negative, No pathogenic variants identified.  The date of this test report is 07/05/2018.  SOCIAL HISTORY:  Social History   Tobacco Use   Smoking status: Never   Smokeless tobacco: Never  Vaping Use   Vaping Use: Never used  Substance Use Topics   Alcohol use: No    Comment: occasionally   Drug use: No      PHYSICAL EXAMINATION:  Vital Signs:   Vitals:   12/07/22 1455  BP: 115/74  Pulse: 75  Resp: 12  Temp: (!) 97.3 F (36.3 C)  SpO2: 100%   Filed Weights   12/07/22 1455  Weight: 118 lb 4.8 oz (53.7 kg)   General: Well-nourished, well-appearing female in no acute distress.  She is unaccompanied in clinic today.   HEENT: Head is normocephalic.  Pupils equal and reactive to light. Conjunctivae clear without exudate.  Sclerae anicteric. Oral mucosa is pink, moist.  Oropharynx is pink without lesions or erythema.  Lymph: No cervical, supraclavicular, or infraclavicular lymphadenopathy noted on palpation.  Cardiovascular: Regular rate and rhythm.Marland Kitchen Respiratory: Clear to auscultation bilaterally. Chest expansion symmetric; breathing non-labored.  Neuro: No focal deficits. Steady gait.  Psych: Mood and affect normal and appropriate for situation.  Extremities: No edema. MSK: No focal spinal tenderness to palpation.  Full range of motion in bilateral upper extremities Skin: Warm and dry.  LABORATORY DATA:  None for this visit.  DIAGNOSTIC IMAGING:  None for this visit.    ASSESSMENT AND PLAN:  Penny Kim is a pleasant 52 y.o. 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    1. Stage IIA right breast cancer:  Penny Kim is doing well since completion of  Tamoxifen therapy in May 2022.  Today, a comprehensive survivorship care plan and treatment summary was reviewed with the patient today detailing her breast cancer diagnosis, treatment course, potential late/long-term effects of treatment, appropriate follow-up care with recommendations for the future, and patient education resources.  A copy of this summary, along with  a letter will be sent to the patient's primary care provider via mail/fax/In Basket message after today's visit.    #. Fatigue: Discussed possible etiologies including chronic effects from chemotherapy and antiestrogen therapy. Encouraged patient to exercise and eat a balanced diet. I encouraged patient to follow up with PCP, Dr. Delight Stare, to further evaluate underlying causes including thyroid dysfunction and vitamin deficiencies.   #. Bone health:  Given Penny Kim's age/history of breast cancer and her current treatment regimen including anti-estrogen therapy with Tamoxifen, she is at risk for bone demineralization.  She is uncertain if she has undergone bone scan in the past so we will order one now. In the meantime, she was encouraged to increase her consumption of foods rich in calcium, as well as increase her weight-bearing activities.  She was given education on specific activities to promote bone health.  #. Cancer screening:  Due to Penny Kim's history and her age, she should receive screening for skin cancers, colon cancer, and gynecologic cancers.  The information and recommendations are listed on the patient's comprehensive care plan/treatment summary and were reviewed in detail with the patient.  I will make referral to GI for screening colonoscopy and referral to gynecology to evaluate for gynecologic cancers. She is under the care of dermatology who can monitor her for skin cancers.  #. Health maintenance and wellness promotion: Penny Kim was encouraged to consume 5-7 servings of fruits and vegetables per day. We reviewed  the "Nutrition Rainbow" handout, as well as the handout "Take Control of Your Health and Reduce Your Cancer Risk" from the [place].  She does struggle with affordability of food which has resulted in 15 lb weight loss. Social work was contact who provided some food for her to take home. She will follow up with social worker to assist her with food donations. She was also encouraged to engage in exercise for 30 minutes per day most days of the week. We discussed the [employer] fitness program, which is designed for cancer survivors to help them become more physically fit after cancer treatments.  Due to ongoing hip pain, encouraged low intensity exercises such as water aerobics.   #Menopausal symptoms:Patient reports vaginal dryness since undergoing breast cancer therapy. Encouraged non-estrogen lubricants to alleviate symptoms. Patient will discuss further with gynecologist.    #. Support services/counseling: It is not uncommon for this period of the patient's cancer care trajectory to be one of many emotions and stressors.  We discussed an opportunity for her to participate in the next session of [facility] ("Finding Your New Normal") support group series designed for patients after they have completed treatment.   Penny Kim was encouraged to take advantage of our many other support services programs, support groups, and/or counseling in coping with her new life as a cancer survivor after completing anti-cancer treatment.  She was offered support today through active listening and expressive supportive counseling.  She was given information regarding our available services and encouraged to contact me with any questions or for help enrolling in any of our support group/programs.    Dispo:   -Return to cancer center 1 year for long term survivorship visit -Follow up with plastic surgery to discuss exchanging breast implants. New referral placed to Dr. Marla Roe since Dr. Harlow Mares is retired.   -Referral to GI for screening colonoscopy -Referral to gynecology -Bone scan ordered  I have spent a total of 30 minutes minutes of face-to-face and non-face-to-face time, preparing to see the patient,  obtaining and/or reviewing separately obtained history, performing a medically appropriate examination, counseling and educating the patient, ordering tests/procedures, referring and communicating with other health care professionals, documenting clinical information in the electronic health record, , and care coordination.     Kennan [phone]   Note: PRIMARY CARE PROVIDER Marguerita Merles, [place] [phone]

## 2022-12-07 NOTE — Patient Instructions (Addendum)
For scalp  Continue Ketoconazole shampoo as directed  Discontinue clobetasol solution using at scalp  Start Fluocinolone acetonide scalp 0.01 % oil apply 1 - 2 times daily as needed for itchy scalp. This will be available at [facility] in [place]. They mail it to your house for free.    For feet  Can start zoryve samples - apply thin layer of cream daily to feet   Continue lamisil cream twice a day  Start ciclopirox cream twice a day  Stop using triamcinolone cream at feet.    Recommend OTC Gold Bond Rapid Relief Anti-Itch cream (pramoxine + menthol), CeraVe Anti-itch cream or lotion (pramoxine), Sarna lotion (Original- menthol + camphor or Sensitive- pramoxine) or Eucerin 12 hour Itch Relief lotion (menthol) up to 3 times per day to areas on body that are itchy.   Recommend soaps for eczema and dry skin     Gentle Skin Care Guide  1. Bathe no more than once a day.  2. Avoid bathing in hot water  3. Use a mild soap like Dove, Vanicream, Cetaphil, CeraVe. Can use Lever 2000 or Cetaphil antibacterial soap  4. Use soap only where you need it. On most days, use it under your arms, between your legs, and on your feet. Let the water rinse other areas unless visibly dirty.  5. When you get out of the bath/shower, use a towel to gently blot your skin dry, don't rub it.  6. While your skin is still a little damp, apply a moisturizing cream such as Vanicream, CeraVe, Cetaphil, Eucerin, Sarna lotion or plain Vaseline Jelly. For hands apply Neutrogena [place] Hand Cream or Excipial Hand Cream.  7. Reapply moisturizer any time you start to itch or feel dry.  8. Sometimes using free and clear laundry detergents can be helpful. Fabric softener sheets should be avoided. Downy Free & Gentle liquid, or any liquid fabric softener that is free of dyes and perfumes, it acceptable to use  9. If your doctor has given you prescription creams you may apply moisturizers over them             Due to recent changes in healthcare laws, you may see results of your pathology and/or laboratory studies on MyChart before the doctors have had a chance to review them. We understand that in some cases there may be results that are confusing or concerning to you. Please understand that not all results are received at the same time and often the doctors may need to interpret multiple results in order to provide you with the best plan of care or course of treatment. Therefore, we ask that you please give [place] 2 business days to thoroughly review all your results before contacting the office for clarification. Should we see a critical lab result, you will be contacted sooner.   If You Need Anything After Your Visit  If you have any questions or concerns for your doctor, please call our main line at [phone] and press option 4 to reach your doctor's medical assistant. If no one answers, please leave a voicemail as directed and we will return your call as soon as possible. Messages left after 4 pm will be answered the following business day.   You may also send [place] a message via [facility]. We typically respond to MyChart messages within 1-2 business days.  For prescription refills, please ask your pharmacy to contact our office. Our fax number is [phone].  If you have an urgent issue when the clinic  is closed that cannot wait until the next business day, you can page your doctor at the number below.    Please note that while we do our best to be available for urgent issues outside of office hours, we are not available 24/7.   If you have an urgent issue and are unable to reach [place], you may choose to seek medical care at your doctor's office, retail clinic, urgent care center, or emergency room.  If you have a medical emergency, please immediately call 911 or go to the emergency department.  Pager Numbers  - Dr. Nehemiah Massed: [phone]  - Dr. Laurence Ferrari: [phone]  - Dr.  Nicole Kindred: [phone]  In the event of inclement weather, please call our main line at [phone] for an update on the status of any delays or closures.  Dermatology Medication Tips: Please keep the boxes that topical medications come in in order to help keep track of the instructions about where and how to use these. Pharmacies typically print the medication instructions only on the boxes and not directly on the medication tubes.   If your medication is too expensive, please contact our office at [phone] option 4 or send [place] a message through [place].   We are unable to tell what your co-pay for medications will be in advance as this is different depending on your insurance coverage. However, we may be able to find a substitute medication at lower cost or fill out paperwork to get insurance to cover a needed medication.   If a prior authorization is required to get your medication covered by your insurance company, please allow [place] 1-2 business days to complete this process.  Drug prices often vary depending on where the prescription is filled and some pharmacies may offer cheaper prices.  The website www.goodrx.com contains coupons for medications through different pharmacies. The prices here do not account for what the cost may be with help from insurance (it may be cheaper with your insurance), but the website can give you the price if you did not use any insurance.  - You can print the associated coupon and take it with your prescription to the pharmacy.  - You may also stop by our office during regular business hours and pick up a GoodRx coupon card.  - If you need your prescription sent electronically to a different pharmacy, notify our office through [facility] or by phone at [phone] option 4.     Si Usted Necesita Algo Despus de Su Visita  Tambin puede enviarnos un mensaje a travs de Pharmacist, community. Por lo general respondemos a los mensajes de MyChart en el transcurso  de 1 a 2 das hbiles.  Para renovar recetas, por favor pida a su farmacia que se ponga en contacto con nuestra oficina. Harland Dingwall de fax es [place] [phone].  Si tiene un asunto urgente cuando la clnica est cerrada y que no puede esperar hasta el siguiente da hbil, puede llamar/localizar a su doctor(a) al nmero que aparece a continuacin.   Por favor, tenga en cuenta que aunque hacemos todo lo posible para estar disponibles para asuntos urgentes fuera del horario de [place], no estamos disponibles las 24 horas del da, los 7 das de la [place].   Si tiene un problema urgente y no puede comunicarse con nosotros, puede optar por buscar atencin mdica  en el consultorio de su doctor(a), en una clnica privada, en un centro de atencin urgente o en una sala de emergencias.  Si tiene Engineer, maintenance (IT)  mdica, por favor llame inmediatamente al 911 o vaya a la sala de emergencias.  Nmeros de bper  - Dr. Nehemiah Massed: [phone]  - Dra. Moye: [phone]  - Dra. Nicole Kindred: [phone]  En caso de inclemencias del [place], por favor llame a Johnsie Kindred principal al [phone] para una actualizacin sobre el [place] de cualquier retraso o cierre.  Consejos para la medicacin en dermatologa: Por favor, guarde las cajas en las que vienen los medicamentos de uso tpico para ayudarle a seguir las instrucciones sobre dnde y cmo usarlos. Las farmacias generalmente imprimen las instrucciones del medicamento slo en las cajas y no directamente en los tubos del [place].   Si su medicamento es muy caro, por favor, pngase en contacto con Zigmund Daniel llamando al [phone] y presione la opcin 4 o envenos un mensaje a travs de Pharmacist, community.   No podemos decirle cul ser su copago por los medicamentos por adelantado ya que esto es diferente dependiendo de la cobertura de su seguro. Sin embargo, es posible que podamos encontrar un medicamento sustituto a Electrical engineer un formulario  para que el seguro cubra el medicamento que se considera necesario.   Si se requiere una autorizacin previa para que su compaa de seguros [place] su medicamento, por favor permtanos de 1 a 2 das hbiles para completar este proceso.  Los precios de los medicamentos varan con frecuencia dependiendo del Environmental consultant de dnde se surte la receta y alguna farmacias pueden ofrecer precios ms baratos.  El sitio web www.goodrx.com tiene cupones para medicamentos de Airline pilot. Los precios aqu no tienen en cuenta lo que podra costar con la ayuda del seguro (puede ser ms barato con su seguro), pero el sitio web puede darle el precio si no utiliz Research scientist (physical sciences).  - Puede imprimir el cupn correspondiente y llevarlo con su receta a la farmacia.  - Tambin puede pasar por nuestra oficina durante el horario de atencin regular y Charity fundraiser una tarjeta de cupones de GoodRx.  - Si necesita que su receta se enve electrnicamente a una farmacia diferente, informe a nuestra oficina a travs de MyChart de [place] o por telfono llamando al [phone] y presione la opcin 4.

## 2022-12-07 NOTE — Progress Notes (Signed)
Follow-Up Visit   Subjective  Penny Kim is a 52 y.o. female who presents for the following: Dermatitis (Patient here for follow up on scalp, patient using ketoconazole shampoo weekly. Reports did not start fluocinolone oil. She reports not able to get insurance to cover. Patient using clobetasol solution once weekly but reports that she is still itchy) and Tinea Pedis (Patient reports right foot has improved, currently using over the counter treatment. ).  Clobetasol solution has started using at scalp. Reports that scalp is still itchy  The following portions of the chart were reviewed this encounter and updated as appropriate:  Tobacco  Allergies  Meds  Problems  Med Hx  Surg Hx  Fam Hx      Review of Systems: No other skin or systemic complaints except as noted in HPI or Assessment and Plan.   Objective  Well appearing patient in no apparent distress; mood and affect are within normal limits.  A focused examination was performed including face, scalp, feet. Relevant physical exam findings are noted in the Assessment and Plan.  b/l feet Scaling and maceration web spaces and over distal and lateral soles.    Assessment & Plan  Tinea pedis of both feet b/l feet  Koh positive at last appointment  She reports she is using OTC lamisil.  Advised to stop TMC ointment as this can make tinea pedis worse. She may have concomitant dermatitis or possibly psoriasis but given positive KOH want to be sure we clear the tinea pedis.   Apply ciclopirox cream twice a day and terbinafine (lamisil) cream twice a day. Advised being consistent with treatment is important.    Related Medications ciclopirox (LOPROX) 0.77 % cream Apply topically 2 (two) times daily. To scaly rash at feet  Dermatitis  Related Medications ketoconazole (NIZORAL) 2 % shampoo apply once weekly, massage into scalp and leave in for 10 minutes before rinsing out  Fluocinolone Acetonide Scalp 0.01 %  OIL Apply 1 Application topically as directed. 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 Condition is symptomatic/ bothersome to patient. Not currently at goal.  Seborrheic Dermatitis is a chronic persistent rash characterized by pinkness and scaling most commonly of the mid face but also can occur on the scalp (dandruff), ears; mid chest, mid back and groin.  It tends to be exacerbated by stress and cooler weather.  People who have neurologic disease may experience new onset or exacerbation of existing seborrheic dermatitis.  The condition is not curable but treatable and can be controlled.  Treatment Plan: Start fluocinolone scalp oil daily. Sent to [facility]. Apply ketoconazole shampoo apply three times per week, massage into scalp and leave in for 10 minutes before rinsing out. This can be followed by conditioner or shampoo and conditioner of your choice.   SCAR - Dyspigmented smooth macule or patch. - Benign-appearing.  Observation.  Call clinic for new or changing lesions. Recommend daily broad spectrum sunscreen SPF 30+, reapply every 2 hours as needed. - Recommend Serica moisturizing scar formula cream every night or [facility] brand or Mederma silicone scar sheet every night for the first year after a scar appears to help with scar remodeling if desired. Scars remodel on their own for a full year and will gradually improve in appearance over time.  Return for 4 - 6 weeks feet and scalp follow  up.  I, Ruthell Rummage, CMA, am acting as scribe for Forest Gleason, MD.  Documentation: I have reviewed the above documentation for accuracy and completeness, and I agree with the above.  Forest Gleason, MD

## 2022-12-08 ENCOUNTER — Telehealth: Payer: Self-pay

## 2022-12-08 ENCOUNTER — Telehealth: Payer: Self-pay | Admitting: Physician Assistant

## 2022-12-08 NOTE — Telephone Encounter (Signed)
Need referral to gynecology  IT need bone scan schedule, I think at [facility]   referral faxed to [place]. Confirmation received  [facility]   pt advised via My Chart message

## 2022-12-08 NOTE — Telephone Encounter (Signed)
Per 3/19 LOS reached out to patient to schedule; unable to reach patient.

## 2022-12-09 NOTE — Telephone Encounter (Signed)
got a reesponse that gyn is not accepting new referrals at this time can you send to a different location   IT  New referral faxed to [facility] [phone].  Confirmation received

## 2022-12-13 ENCOUNTER — Encounter: Payer: Self-pay | Admitting: Dermatology

## 2022-12-13 NOTE — Telephone Encounter (Signed)
Referral denied by [facility] GYN stating thewy are not taking new patients with medicare/medicaid.  Referral faxed to [place] [phone].  Confirmation received

## 2022-12-15 ENCOUNTER — Emergency Department: Payer: Medicare Other

## 2022-12-15 ENCOUNTER — Other Ambulatory Visit: Payer: Self-pay

## 2022-12-15 ENCOUNTER — Emergency Department
Admission: EM | Admit: 2022-12-15 | Discharge: 2022-12-15 | Disposition: A | Discharge status: Home / Self Care | Payer: Medicare Other | Attending: Emergency Medicine | Admitting: Emergency Medicine

## 2022-12-15 DIAGNOSIS — J3489 Other specified disorders of nose and nasal sinuses: Secondary | ICD-10-CM | POA: Insufficient documentation

## 2022-12-15 DIAGNOSIS — S098XXA Other specified injuries of head, initial encounter: Secondary | ICD-10-CM | POA: Diagnosis not present

## 2022-12-15 DIAGNOSIS — Y92410 Unspecified street and highway as the place of occurrence of the external cause: Secondary | ICD-10-CM | POA: Diagnosis not present

## 2022-12-15 DIAGNOSIS — F1320 Sedative, hypnotic or anxiolytic dependence, uncomplicated: Secondary | ICD-10-CM | POA: Diagnosis not present

## 2022-12-15 LAB — URINE DRUG SCREEN, QUALITATIVE (ARMC ONLY)
Amphetamines, Ur Screen: NOT DETECTED
Barbiturates, Ur Screen: NOT DETECTED
Benzodiazepine, Ur Scrn: POSITIVE — AB
Cannabinoid 50 Ng, Ur ~~LOC~~: NOT DETECTED
Cocaine Metabolite,Ur ~~LOC~~: NOT DETECTED
MDMA (Ecstasy)Ur Screen: NOT DETECTED
Methadone Scn, Ur: NOT DETECTED
Opiate, Ur Screen: NOT DETECTED
Phencyclidine (PCP) Ur S: NOT DETECTED
Tricyclic, Ur Screen: NOT DETECTED

## 2022-12-15 LAB — CBC
HCT: 38.4 % (ref 36.0–46.0)
Hemoglobin: 11.7 g/dL — ABNORMAL LOW (ref 12.0–15.0)
MCH: 24.6 pg — ABNORMAL LOW (ref 26.0–34.0)
MCHC: 30.5 g/dL (ref 30.0–36.0)
MCV: 80.8 fL (ref 80.0–100.0)
Platelets: 267 10*3/uL (ref 150–400)
RBC: 4.75 MIL/uL (ref 3.87–5.11)
RDW: 14.9 % (ref 11.5–15.5)
WBC: 2.4 10*3/uL — ABNORMAL LOW (ref 4.0–10.5)
nRBC: 0 % (ref 0.0–0.2)

## 2022-12-15 LAB — ETHANOL: Alcohol, Ethyl (B): <10 mg/dL (ref <10)

## 2022-12-15 MED ORDER — NAPROXEN 375 MG PO TABS: 375.0000 mg | ORAL_TABLET | Freq: Two times a day (BID) | ORAL | 0 refills | Status: AC | PRN

## 2022-12-15 MED ORDER — ONDANSETRON 4 MG PO TBDP: 4.0000 mg | ORAL_TABLET | Freq: Three times a day (TID) | ORAL | 0 refills | Status: DC | PRN

## 2022-12-15 MED ORDER — ACETAMINOPHEN 500 MG PO TABS
1000.0000 mg | ORAL_TABLET | Freq: Once | ORAL | Status: AC
  Administered 2022-12-15: 1000 mg via ORAL
  Filled 2022-12-15: qty 2
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
## 2022-12-15 NOTE — Discharge Instructions (Addendum)
Take tylenol and the prescribed naproxen for pain  Drink plenty of fluids
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
## 2022-12-16 ENCOUNTER — Other Ambulatory Visit: Payer: Medicare Other

## 2022-12-17 ENCOUNTER — Ambulatory Visit: Payer: Medicare Other | Admitting: Nurse Practitioner

## 2022-12-17 ENCOUNTER — Other Ambulatory Visit: Payer: Self-pay | Admitting: Dermatology

## 2022-12-17 DIAGNOSIS — L219 Seborrheic dermatitis, unspecified: Secondary | ICD-10-CM

## 2022-12-27 ENCOUNTER — Telehealth: Payer: Self-pay

## 2022-12-27 DIAGNOSIS — B353 Tinea pedis: Secondary | ICD-10-CM

## 2022-12-27 DIAGNOSIS — L309 Dermatitis, unspecified: Secondary | ICD-10-CM

## 2022-12-27 MED ORDER — FLUOCINOLONE ACETONIDE SCALP 0.01 % EX OIL: 1.0000 | TOPICAL_OIL | CUTANEOUS | 1 refills | Status: DC

## 2022-12-27 MED ORDER — CICLOPIROX OLAMINE 0.77 % EX CREA: TOPICAL_CREAM | Freq: Two times a day (BID) | CUTANEOUS | 1 refills | Status: DC

## 2022-12-27 MED ORDER — KETOCONAZOLE 2 % EX SHAM: MEDICATED_SHAMPOO | CUTANEOUS | 2 refills | Status: DC

## 2022-12-27 NOTE — Telephone Encounter (Signed)
Patient called requesting that all of her prescriptions be send to [facility]. I sent all three prescriptions in for her, and advised her that the Fluocinolone may not be covered by insurance which is why it was sent to [facility]. Patient will contact us if too expensive to have an alternative sent in.

## 2022-12-30 ENCOUNTER — Ambulatory Visit: Payer: Medicare Other | Admitting: Dermatology

## 2023-01-12 ENCOUNTER — Ambulatory Visit: Payer: Medicare Other | Admitting: Dermatology

## 2023-01-13 ENCOUNTER — Ambulatory Visit: Payer: Medicare Other | Admitting: Dermatology

## 2023-01-14 NOTE — Telephone Encounter (Signed)
Referral denied by [facility] GYN stating they do not take pt's insurance.    3rd referral faxed to [facility] GYN.  Confirmation received

## 2023-01-20 ENCOUNTER — Ambulatory Visit: Payer: Medicare Other | Admitting: Nurse Practitioner

## 2023-01-26 ENCOUNTER — Ambulatory Visit (AMBULATORY_SURGERY_CENTER): Payer: Medicare Other

## 2023-01-26 VITALS — Ht 64.0 in | Wt 119.0 lb

## 2023-01-26 DIAGNOSIS — M5136 Other intervertebral disc degeneration, lumbar region: Secondary | ICD-10-CM | POA: Insufficient documentation

## 2023-01-26 DIAGNOSIS — Z1211 Encounter for screening for malignant neoplasm of colon: Secondary | ICD-10-CM

## 2023-01-26 DIAGNOSIS — M51369 Other intervertebral disc degeneration, lumbar region without mention of lumbar back pain or lower extremity pain: Secondary | ICD-10-CM | POA: Insufficient documentation

## 2023-01-26 MED ORDER — PEG 3350-KCL-NA BICARB-NACL 420 G PO SOLR: 4000.0000 mL | Freq: Once | ORAL | 0 refills | Status: AC

## 2023-01-26 NOTE — Progress Notes (Signed)
No egg or soy allergy known to patient  No issues known to pt with past sedation with any surgeries or procedures Patient denies ever being told they had issues or difficulty with intubation  No FH of Malignant Hyperthermia Pt is not on diet pills Pt is not on  home 02  Pt is not on blood thinners  Pt reports constipation 2-3 BM per week stools typically are hard and some straining instructions given for extra miralax  No A fib or A flutter Have any cardiac testing pending-- annual EKG  Pt reports walking with a cane and walker. Pt is able to walk with minimal help but does report frequent  falls   Patient's chart reviewed by Cathlyn Parsons CNRA prior to previsit and patient appropriate for the [facility].  Previsit completed and red dot placed by patient's name on their procedure day (on provider's schedule).      PV complete. Prep reviewed with pt. Instructions sent via mychart and to mailing address. Rx sent to pharmacy on file per request. Coupons will be sent with paper work pt has any issues with pricing.

## 2023-02-01 ENCOUNTER — Other Ambulatory Visit: Payer: Self-pay

## 2023-02-01 DIAGNOSIS — L309 Dermatitis, unspecified: Secondary | ICD-10-CM

## 2023-02-01 MED ORDER — FLUOCINOLONE ACETONIDE SCALP 0.01 % EX OIL: 1.0000 | TOPICAL_OIL | CUTANEOUS | 1 refills | Status: DC

## 2023-02-01 NOTE — Progress Notes (Signed)
Patient called needing RF of medication due to thick hair. Patient is using more oil to get to scalp. aw

## 2023-02-02 ENCOUNTER — Encounter: Payer: Self-pay | Admitting: Nurse Practitioner

## 2023-02-02 ENCOUNTER — Ambulatory Visit: Payer: Medicare Other | Attending: Nurse Practitioner | Admitting: Nurse Practitioner

## 2023-02-02 VITALS — BP 126/78 | HR 56 | Ht 64.5 in | Wt 121.2 lb

## 2023-02-02 DIAGNOSIS — E782 Mixed hyperlipidemia: Secondary | ICD-10-CM | POA: Diagnosis present

## 2023-02-02 DIAGNOSIS — I10 Essential (primary) hypertension: Secondary | ICD-10-CM | POA: Diagnosis present

## 2023-02-02 DIAGNOSIS — R072 Precordial pain: Secondary | ICD-10-CM

## 2023-02-02 NOTE — Progress Notes (Signed)
Office Visit    Patient Name: Penny Kim Date of Encounter: 02/02/2023  Primary Care Provider:  Leanna Sato, MD Primary Cardiologist:  Yvonne Kendall, MD  Chief Complaint    52 year old female with a history of orthostatic hypotension and syncope, right-sided breast cancer status post bilateral mastectomies with reconstruction chemotherapy in January 2012, anxiety, and depression, who presents for follow-up related to HTN.  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 TTE 10/2016: EF 50-55%, no RWMA, normal LV diastolic function, left atrium normal, RV systolic function normal, PASP normal   History of stress test    a. treadmill Myoview 11/2016: small defect of mild severity present in the apex location felt to be secondary to breast attenuation. EF 55-65%. Normal study   HTN (hypertension)    Orthostatic hypotension    Psoriasis    Past Surgical History:  Procedure Laterality Date   BREAST RECONSTRUCTION  01/06/2012   Procedure: BREAST RECONSTRUCTION;  Surgeon: Etter Sjogren, MD;  Location: [facility];  Service: Plastics;  Laterality: Bilateral;  bilateral removal of tissue expanders, bilateral placement of implants--Breast   BREAST SURGERY  06/19/2010   exploration of right mastectomy site due to post-op bleeding   INSERTION OF TISSUE EXPANDER AFTER MASTECTOMY  06/19/2010   bilat. mastectomies with bilat. tissue expanders   PORT-A-CATH REMOVAL  11/26/2010   PORTACATH PLACEMENT   07/23/2010   TISSUE EXPANDER REMOVAL  10/20/2010   left    Allergies  Allergies  Allergen Reactions   Prochlorperazine Other (See Comments)    Syncope   Methadone Itching   Propranolol Rash   Tape Rash   Wound Dressing Adhesive Rash    History of Present Illness    52 year old female with the above past medical history including orthostatic hypotension with syncope, hypertension, right-sided breast cancer status post bilateral mastectomies with reconstruction and chemotherapy (doxorubicin/cyclophosphamide, paclitaxel) in January 2012, anxiety, depression, and chronic pain.  She was previously evaluated in January 2017 in the setting of syncope.  Echo in early 2018 showed normal LV function.  Stress testing was completed in March 2018 showing a small defect of mild severity in the apex, felt to be secondary to breast attenuation.  No ischemia was noted.    Penny Kim was last seen in cardiology clinic in February 2023, at which time she reported mild lower extremity swelling and tenderness following discontinuation of HCTZ the year prior due to soft blood pressures and mild creatinine elevation.  She reported a high salt diet at that time, and was taking amlodipine 10 mg daily.  Lab work was ordered however, patient did not follow through at that time though renal function electrolytes have since been stable.  She has had several ED visits with cocaine positivity, most recently December 2023.  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  She does not think she has used cocaine since December but has been having some issues with her nasal septum and has follow-up with ENT.  From a cardiac standpoint, she has overall been stable.  She occasionally notes a sharp pain under her left breast, which has more or less occurred since breast reconstruction in the past.  She does not experience exertional  chest pain or dyspnea, denies palpitations, PND, orthopnea, dizziness, syncope, or early satiety.  She sometimes notes mild lower extremity swelling though she has no edema today.  Home Medications    Current Outpatient Medications  Medication Sig Dispense Refill   acetaminophen (TYLENOL) 500 MG tablet Take 1,000 mg by mouth every 6 (six) hours as needed (For hip pain or fever.). (Patient not taking: Reported on 01/26/2023)     acyclovir (ZOVIRAX) 400 MG tablet Take 400 mg by mouth 3 (three) times daily.     amLODipine (NORVASC) 10 MG tablet Take 1 tablet (10 mg total) by mouth daily. Needs to keep appt for further refills 15 tablet 0   ciclopirox (LOPROX) 0.77 % cream Apply topically 2 (two) times daily. To scaly rash at feet 90 g 1   clobetasol (TEMOVATE) 0.05 % external solution APPLY 1 APPLICATION TOPICALLY AS DIRECTED ONCE TO TWICE DAILY AS NEEDED FOR [facility] (Patient not taking: Reported on 02/02/2023) 50 mL 0   clonazePAM (KLONOPIN) 1 MG tablet Take 1 mg by mouth 3 (three) times daily as needed.     Fluocinolone Acetonide Scalp 0.01 % OIL Apply 1 Application topically as directed. Apply 1-2 daily as needed for itchy scalp 118.28 mL 1   gabapentin (NEURONTIN) 300 MG capsule Take 300 mg by mouth 4 (four) times daily as needed (For pain).     hydrOXYzine (ATARAX) 25 MG tablet Take 1 tablet (25 mg total) by mouth 3 (three) times daily as needed for anxiety. (Patient not taking: Reported on 01/26/2023) 30 tablet 0   ketoconazole (NIZORAL) 2 % shampoo apply once weekly, massage into scalp and leave in for 10 minutes before rinsing out 120 mL 2   mupirocin ointment (BACTROBAN) 2 % Apply 1 Application topically 3 (three) times daily. Apply to affected area. (Patient not taking: Reported on 02/02/2023)     ondansetron (ZOFRAN-ODT) 4 MG disintegrating tablet Take 1 tablet (4 mg total) by mouth every 8 (eight) hours as needed for nausea or vomiting. (Patient not taking: Reported on 01/26/2023) 12 tablet 0    traZODone (DESYREL) 50 MG tablet Take 1 tablet (50 mg total) by mouth at bedtime as needed for sleep. (Patient not taking: Reported on 01/26/2023) 30 tablet 0   TRINTELLIX 10 MG TABS tablet Take 10 mg by mouth daily.     No current facility-administered medications for this visit.     Review of Systems    Occasional sharp and fleeting left chest pain.  Occasional mild ankle swelling.  She denies dyspnea, palpitations, PND, orthopnea, dizziness, syncope, or early satiety.  All other systems reviewed and are otherwise negative except as noted above.    Physical Exam    VS:  BP 126/78 (BP Location: Left Arm, Patient Position: Sitting, Cuff Size: Normal)   Pulse (!) 56   Ht 5' 4.5" (1.638 m)   Wt 121 lb 3.2 oz (55 kg)   SpO2 97%   BMI 20.48 kg/m  , BMI Body mass index is 20.48 kg/m.     GEN: Well nourished, well developed, in no acute distress. HEENT: normal. Neck: Supple, no  JVD, carotid bruits, or masses. Cardiac: RRR, no murmurs, rubs, or gallops. No clubbing, cyanosis, edema.  Radials 2+/PT 2+ and equal bilaterally.  Respiratory:  Respirations regular and unlabored, clear to auscultation bilaterally. GI: Soft, nontender, nondistended, BS + x 4. MS: no deformity or atrophy. Skin: warm and dry, no rash. Neuro:  Strength and sensation are intact. Psych: Normal affect.  Accessory Clinical Findings    Lab Results  Component Value Date   WBC 2.4 (L) 12/15/2022   HGB 11.7 (L) 12/15/2022   HCT 38.4 12/15/2022   MCV 80.8 12/15/2022   PLT 267 12/15/2022   Lab Results  Component Value Date   CREATININE 0.83 09/18/2022   BUN 14 09/18/2022   NA 137 09/18/2022   K 3.5 09/18/2022   CL 103 09/18/2022   CO2 25 09/18/2022   Lab Results  Component Value Date   ALT 19 09/18/2022   AST 30 09/18/2022   ALKPHOS 55 09/18/2022   BILITOT 0.7 09/18/2022   Lab Results  Component Value Date   CHOL 217 (H) 09/01/2022   HDL 77 09/01/2022   LDLCALC 125 (H) 09/01/2022   LDLDIRECT 124 (H)  09/01/2017   TRIG 77 09/01/2022   CHOLHDL 2.8 09/01/2022    Lab Results  Component Value Date   HGBA1C 6.1 (H) 09/01/2022    Assessment & Plan    1.  Essential hypertension: Blood pressure controlled today at 126/78 on amlodipine therapy.  But this was a concern last year, she has no edema on exam today.  2.  Precordial chest pain: Long history of intermittent chest pain dating back to breast cancer diagnosis and breast reconstruction.  Occasional sharp and fleeting left chest pain.  No exertional symptoms or dyspnea.  Prior stress testing in the setting of similar symptoms with low risk with normal LV function.  No further ischemic evaluation is warranted at this time.  3.  Hyperlipidemia: LDL was 125 in December.  10-year risk of cardiac event calculates to 5.4%.  No significant coronary calcium noted on prior chest CT in 2015.  Recommend lifestyle modifications.  4.  Anxiety/substance abuse: Followed by psychiatry.  Notes that she is undergoing counseling for drug use.  Has not used cocaine since December.  5.  Disposition: Follow-up in 1 year or sooner if necessary.  Nicolasa Ducking, NP 02/02/2023, 11:02 AM

## 2023-02-02 NOTE — Patient Instructions (Signed)
Medication Instructions:  No changes *If you need a refill on your cardiac medications before your next appointment, please call your pharmacy*   Lab Work: None ordered If you have labs (blood work) drawn today and your tests are completely normal, you will receive your results only by: MyChart Message (if you have MyChart) OR A paper copy in the mail If you have any lab test that is abnormal or we need to change your treatment, we will call you to review the results.   Testing/Procedures: None ordered   Follow-Up: At [facility], you and your health needs are our priority.  As part of our continuing mission to provide you with exceptional heart care, we have created designated Provider Care Teams.  These Care Teams include your primary Cardiologist (physician) and Advanced Practice Providers (APPs -  Physician Assistants and Nurse Practitioners) who all work together to provide you with the care you need, when you need it.  We recommend signing up for the patient portal called "MyChart".  Sign up information is provided on this After Visit Summary.  MyChart is used to connect with patients for Virtual Visits (Telemedicine).  Patients are able to view lab/test results, encounter notes, upcoming appointments, etc.  Non-urgent messages can be sent to your provider as well.   To learn more about what you can do with MyChart, go to [email or web address].    Your next appointment:   Follow up in 12 months with Dr. Okey Dupre

## 2023-02-07 ENCOUNTER — Telehealth: Payer: Self-pay | Admitting: *Deleted

## 2023-02-07 NOTE — Telephone Encounter (Signed)
Attempted to reach the patient. Was unable to leave a message due to the mailbox being full.    Per message sent to triage: Appointment Request From: Penny Kim   With Provider: Nicolasa Ducking, NP [facility] [place]]   Preferred Date Range: 02/10/2023 - 02/19/2023   Preferred Times: Any Time   Reason for visit: Office Visit   Comments: There is often a heaviness and off beat of my heart. I think this issue was missed in my most recent appointment.

## 2023-02-08 ENCOUNTER — Encounter: Payer: Self-pay | Admitting: Dermatology

## 2023-02-08 ENCOUNTER — Ambulatory Visit (INDEPENDENT_AMBULATORY_CARE_PROVIDER_SITE_OTHER): Payer: Medicare Other | Admitting: Dermatology

## 2023-02-08 ENCOUNTER — Telehealth: Payer: Self-pay | Admitting: Gastroenterology

## 2023-02-08 DIAGNOSIS — L219 Seborrheic dermatitis, unspecified: Secondary | ICD-10-CM | POA: Diagnosis not present

## 2023-02-08 DIAGNOSIS — L308 Other specified dermatitis: Secondary | ICD-10-CM

## 2023-02-08 DIAGNOSIS — L988 Other specified disorders of the skin and subcutaneous tissue: Secondary | ICD-10-CM | POA: Diagnosis not present

## 2023-02-08 DIAGNOSIS — R21 Rash and other nonspecific skin eruption: Secondary | ICD-10-CM

## 2023-02-08 MED ORDER — CLOBETASOL PROPIONATE 0.05 % EX SOLN: CUTANEOUS | 0 refills | Status: DC

## 2023-02-08 MED ORDER — ACETYLCYSTEINE 600 MG PO CAPS: 1.0000 | ORAL_CAPSULE | Freq: Three times a day (TID) | ORAL | 2 refills | Status: DC

## 2023-02-08 NOTE — Telephone Encounter (Signed)
Inbound call from patient, states she has a procedure tomorrow at 11:30 AM. Patient states she has not been able to get her prep medication and would like to cancel her procedure. Patient rescheduled procedure for 6/6/ at 1:30 PM.

## 2023-02-08 NOTE — Patient Instructions (Addendum)
Recommend N-acetylcysteine (NAC) 600 mg supplement three times per day to help with picking  Start clobetasol solution twice daily to scalp as needed for itch. Avoid applying to face, groin, and axilla. Use as directed. Long-term use can cause thinning of the skin.  Start Wynzora samples to spot treat once daily as needed for itch.   Topical steroids (such as triamcinolone, fluocinolone, fluocinonide, mometasone, clobetasol, halobetasol, betamethasone, hydrocortisone) can cause thinning and lightening of the skin if they are used for too long in the same area. Your physician has selected the right strength medicine for your problem and area affected on the body. Please use your medication only as directed by your physician to prevent side effects.     Wound Care Instructions  Cleanse wound gently with soap and water once a day then pat dry with clean gauze. Apply a thin coat of Petrolatum (petroleum jelly, "Vaseline") over the wound (unless you have an allergy to this). We recommend that you use a new, sterile tube of Vaseline. Do not pick or remove scabs. Do not remove the yellow or white "healing tissue" from the base of the wound.  Cover the wound with fresh, clean, nonstick gauze and secure with paper tape. You may use Band-Aids in place of gauze and tape if the wound is small enough, but would recommend trimming much of the tape off as there is often too much. Sometimes Band-Aids can irritate the skin.  You should call the office for your biopsy report after 1 week if you have not already been contacted.  If you experience any problems, such as abnormal amounts of bleeding, swelling, significant bruising, significant pain, or evidence of infection, please call the office immediately.  Due to recent changes in healthcare laws, you may see results of your pathology and/or laboratory studies on MyChart before the doctors have had a chance to review them. We understand that in some cases there  may be results that are confusing or concerning to you. Please understand that not all results are received at the same time and often the doctors may need to interpret multiple results in order to provide you with the best plan of care or course of treatment. Therefore, we ask that you please give [place] 2 business days to thoroughly review all your results before contacting the office for clarification. Should we see a critical lab result, you will be contacted sooner.   If You Need Anything After Your Visit  If you have any questions or concerns for your doctor, please call our main line at [phone] and press option 4 to reach your doctor's medical assistant. If no one answers, please leave a voicemail as directed and we will return your call as soon as possible. Messages left after 4 pm will be answered the following business day.   You may also send [place] a message via MyChart. We typically respond to MyChart messages within 1-2 business days.  For prescription refills, please ask your pharmacy to contact our office. Our fax number is [phone].  If you have an urgent issue when the clinic is closed that cannot wait until the next business day, you can page your doctor at the number below.    Please note that while we do our best to be available for urgent issues outside of office hours, we are not available 24/7.   If you have an urgent issue and are unable to reach [place], you may choose to seek medical care at your doctor's office,  retail clinic, urgent care center, or emergency room.  If you have a medical emergency, please immediately call 911 or go to the emergency department.  Pager Numbers  - Dr. Gwen Pounds: [phone]  - Dr. Neale Burly: [phone]  - Dr. Roseanne Reno: [phone]  In the event of inclement weather, please call our main line at [phone] for an update on the status of any delays or closures.  Dermatology Medication Tips: Please keep the boxes that topical  medications come in in order to help keep track of the instructions about where and how to use these. Pharmacies typically print the medication instructions only on the boxes and not directly on the medication tubes.   If your medication is too expensive, please contact our office at [phone] option 4 or send [place] a message through MyChart.   We are unable to tell what your co-pay for medications will be in advance as this is different depending on your insurance coverage. However, we may be able to find a substitute medication at lower cost or fill out paperwork to get insurance to cover a needed medication.   If a prior authorization is required to get your medication covered by your insurance company, please allow [place] 1-2 business days to complete this process.  Drug prices often vary depending on where the prescription is filled and some pharmacies may offer cheaper prices.  The website www.goodrx.com contains coupons for medications through different pharmacies. The prices here do not account for what the cost may be with help from insurance (it may be cheaper with your insurance), but the website can give you the price if you did not use any insurance.  - You can print the associated coupon and take it with your prescription to the pharmacy.  - You may also stop by our office during regular business hours and pick up a GoodRx coupon card.  - If you need your prescription sent electronically to a different pharmacy, notify our office through [facility] or by phone at [phone] option 4.

## 2023-02-08 NOTE — Progress Notes (Signed)
Follow-Up Visit   Subjective  Penny Kim is a 52 y.o. female who presents for the following: seb derm and tinea pedis. Patient advises she is using fluocinolone oil twice daily and ketoconazole shampoo almost daily. She advises she still has itching, stabbing pain, sometimes burning and notices little white strings at scalp. Patient has cut her hair for comfort and wonders if she should have a biopsy done.   Tinea pedis at feet with + Eather Colas at last visit. Using ciclopirox twice daily, feet seem to have improved. Patient has used OTC terbinafine in the past.   The following portions of the chart were reviewed this encounter and updated as appropriate: medications, allergies, medical history  Review of Systems:  No other skin or systemic complaints except as noted in HPI or Assessment and Plan.  Objective  Well appearing patient in no apparent distress; mood and affect are within normal limits.   A focused examination was performed of the following areas: Feet, scalp  Relevant exam findings are noted in the Assessment and Plan.  Scalp Minimal erythema at scalp  Scalp Minimal erythema     Assessment & Plan     Seborrheic dermatitis Scalp  Chronic and persistent condition with duration or expected duration over one year. Condition is bothersome/symptomatic for patient. Currently flared.  Continue ketoconoconazole shampoo, leaving in for 10 minutes.  Start clobetasol solution twice daily to scalp as needed for itch. Avoid applying to face, groin, and axilla. Use as directed. Long-term use can cause thinning of the skin.  Start Wynzora samples to spot treat once daily as needed for itch.   Topical steroids (such as triamcinolone, fluocinolone, fluocinonide, mometasone, clobetasol, halobetasol, betamethasone, hydrocortisone) can cause thinning and lightening of the skin if they are used for too long in the same area. Your physician has selected the right strength medicine  for your problem and area affected on the body. Please use your medication only as directed by your physician to prevent side effects.    Related Medications clobetasol (TEMOVATE) 0.05 % external solution APPLY 1 APPLICATION TOPICALLY AS DIRECTED ONCE TO TWICE DAILY AS NEEDED FOR ITCH. Avoid applying to face, groin, and axilla. Use as directed. Long-term use can cause thinning of the skin.  Rash Scalp  With significant itch causing significant distress. She prefers biopsy today as opposed to solely additional therapeutic trial.  Skin / nail biopsy - Scalp Type of biopsy: punch   Informed consent: discussed and consent obtained   Timeout: patient name, date of birth, surgical site, and procedure verified   Procedure prep:  Patient was prepped and draped in usual sterile fashion Prep type:  Isopropyl alcohol Anesthesia: the lesion was anesthetized in a standard fashion   Anesthetic:  1% lidocaine w/ epinephrine 1-100,000 buffered w/ 8.4% NaHCO3 Punch size:  4 mm Suture size:  4-0 Suture type: Prolene (polypropylene)   Suture removal (days):  14 Hemostasis achieved with: suture   Outcome: patient tolerated procedure well   Post-procedure details: wound care instructions given   Additional details:  Petrolatum and a pressure dressing were applied  Specimen 1 - Surgical pathology Differential Diagnosis: r/o Seb Derm vs Dermatophytosis vs Tinea Capitis or other  Check Margins: No Minimal erythema  Related Procedures Culture, Fungus with Smear  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 Condition is bothersome/symptomatic for patient. Currently flared.  Patient describing white strings coming out of her skin and  a feeling of itching, stabbing pain and sometimes burning. 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 She asks about this  today as well as she has been searching on google and thinks this may be her condition. Discussed treatment of choice is pimozide or another similar medication and that I do not prescribe them, but a good psychiatrist familiar with this condition would be the person to prescribe that type of treatment. She is agreeable to a referral.   Recommend N-acetylcysteine (NAC) 600 mg supplement three times per day to help with picking  Will refer to psychiatry    Return in about 2 weeks (around 02/22/2023) for Suture Removal.  Anise Salvo, RMA, am acting as scribe for Darden Dates, MD .   Documentation: I have reviewed the above documentation for accuracy and completeness, and I agree with the above.  Darden Dates, MD

## 2023-02-08 NOTE — Telephone Encounter (Signed)
Attempted to contact pt. Unable to leave message as mailbox is full.  °

## 2023-02-09 ENCOUNTER — Telehealth: Payer: Self-pay

## 2023-02-09 ENCOUNTER — Encounter: Payer: Medicare Other | Admitting: Gastroenterology

## 2023-02-09 NOTE — Telephone Encounter (Signed)
Yes, that's fine. Thank you! 

## 2023-02-09 NOTE — Telephone Encounter (Signed)
Patient left a nurse VM that she does not want to use the Clobetasol Solution. She would like to continue the scalp oil.  Okay to make the switch?

## 2023-02-09 NOTE — Telephone Encounter (Signed)
Called patient and left msg OK to continue scalp oil. aw

## 2023-02-10 NOTE — Telephone Encounter (Signed)
Called and spoke with patient. Patient says that she had a recent appointment with Nicolasa Ducking, NP. Patient states that when she was seen she discussed leg swelling. Patient states that she forget to inform the provider that she when she has the leg swelling she also has shortness of breath when laying flat. She said it does not happen every night. Patient states that it has been happening over the last 2 months intermittently. Patient states that she does not have any vital sign readings when it is happening but will start monitoring. Patient wanted to make sure that provider was aware. Will forward to Nicolasa Ducking, NP.

## 2023-02-15 ENCOUNTER — Other Ambulatory Visit: Payer: Self-pay

## 2023-02-15 DIAGNOSIS — R21 Rash and other nonspecific skin eruption: Secondary | ICD-10-CM

## 2023-02-15 DIAGNOSIS — L988 Other specified disorders of the skin and subcutaneous tissue: Secondary | ICD-10-CM

## 2023-02-15 NOTE — Addendum Note (Signed)
Addended by: Sandi Mealy on: 02/15/2023 11:01 AM   Modules accepted: Level of Service

## 2023-02-16 ENCOUNTER — Telehealth: Payer: Self-pay

## 2023-02-16 NOTE — Telephone Encounter (Signed)
-----   Message from [place], MD sent at 02/16/2023 10:44 AM EDT ----- Skin , scalp SPONGIOTIC DERMATITIS, SEE DESCRIPTION "dandruff or possibly an allergic reaction to something touching her skin in the area such as shampoo, cream or medication".  No fungus or infection seen.  The clobetasol or fluocinolone oil are the treatment of choice. Will discuss and review at her follow-up.   MAs please call. Thank you!

## 2023-02-16 NOTE — Telephone Encounter (Signed)
-----   Message from [place] Moye, MD sent at 02/16/2023 10:44 AM EDT ----- Skin , scalp SPONGIOTIC DERMATITIS, SEE DESCRIPTION "dandruff or possibly an allergic reaction to something touching her skin in the area such as shampoo, cream or medication".  No fungus or infection seen.  The clobetasol or fluocinolone oil are the treatment of choice. Will discuss and review at her follow-up.   MAs please call. Thank you! 

## 2023-02-16 NOTE — Telephone Encounter (Signed)
Patient advised bx results showed "dandruff or possibly an allergic reaction to something touching her skin in the area such as shampoo, cream or medication".  No fungus or infection seen.  Will discuss further at follow up next week. Butch Penny., RMA

## 2023-02-17 ENCOUNTER — Encounter: Payer: Self-pay | Admitting: Certified Registered Nurse Anesthetist

## 2023-02-18 ENCOUNTER — Other Ambulatory Visit: Payer: Self-pay | Admitting: Dermatology

## 2023-02-22 ENCOUNTER — Ambulatory Visit (INDEPENDENT_AMBULATORY_CARE_PROVIDER_SITE_OTHER): Payer: Medicare Other | Admitting: Dermatology

## 2023-02-22 DIAGNOSIS — L219 Seborrheic dermatitis, unspecified: Secondary | ICD-10-CM | POA: Diagnosis not present

## 2023-02-22 DIAGNOSIS — L988 Other specified disorders of the skin and subcutaneous tissue: Secondary | ICD-10-CM

## 2023-02-22 DIAGNOSIS — L309 Dermatitis, unspecified: Secondary | ICD-10-CM

## 2023-02-22 MED ORDER — CLOBETASOL PROPIONATE 0.05 % EX SOLN: CUTANEOUS | 1 refills | Status: DC

## 2023-02-22 MED ORDER — DOXYCYCLINE MONOHYDRATE 100 MG PO CAPS: ORAL_CAPSULE | ORAL | 0 refills | Status: DC

## 2023-02-22 NOTE — Patient Instructions (Addendum)
Gentle Skin Care Guide  1. Bathe no more than once a day.  2. Avoid bathing in hot water  3. Use a mild soap like Dove, Vanicream, Cetaphil, CeraVe. Can use Lever 2000 or Cetaphil antibacterial soap  4. Use soap only where you need it. On most days, use it under your arms, between your legs, and on your feet. Let the water rinse other areas unless visibly dirty.  5. When you get out of the bath/shower, use a towel to gently blot your skin dry, don't rub it.  6. While your skin is still a little damp, apply a moisturizing cream such as Vanicream, CeraVe, Cetaphil, Eucerin, Sarna lotion or plain Vaseline Jelly. For hands apply Neutrogena [place] Hand Cream or Excipial Hand Cream.  7. Reapply moisturizer any time you start to itch or feel dry.  8. Sometimes using free and clear laundry detergents can be helpful. Fabric softener sheets should be avoided. Downy Free & Gentle liquid, or any liquid fabric softener that is free of dyes and perfumes, it acceptable to use  9. If your doctor has given you prescription creams you may apply moisturizers over them   Eczema Skin Care  Buy TWO 16oz jars of CeraVe moisturizing cream  [facility], [facility], [facility] (no prescription needed)  Costs about $15 per jar   Jar #1: Use as a moisturizer as needed. Can be applied to any area of the body. Use twice daily to unaffected areas.  Jar #2: Pour one 50ml bottle of clobetasol 0.05% solution into jar, mix well. Label this jar to indicate the medication has been added. Use twice daily to affected areas. Do not apply to face, groin or underarms.  Moisturizer may burn or sting initially. Try for at least 4 weeks.    Due to recent changes in healthcare laws, you may see results of your pathology and/or laboratory studies on MyChart before the doctors have had a chance to review them. We understand that in some cases there may be results that are confusing or concerning to you. Please understand that not all  results are received at the same time and often the doctors may need to interpret multiple results in order to provide you with the best plan of care or course of treatment. Therefore, we ask that you please give [place] 2 business days to thoroughly review all your results before contacting the office for clarification. Should we see a critical lab result, you will be contacted sooner.   If You Need Anything After Your Visit  If you have any questions or concerns for your doctor, please call our main line at [phone] and press option 4 to reach your doctor's medical assistant. If no one answers, please leave a voicemail as directed and we will return your call as soon as possible. Messages left after 4 pm will be answered the following business day.   You may also send [place] a message via MyChart. We typically respond to MyChart messages within 1-2 business days.  For prescription refills, please ask your pharmacy to contact our office. Our fax number is [phone].  If you have an urgent issue when the clinic is closed that cannot wait until the next business day, you can page your doctor at the number below.    Please note that while we do our best to be available for urgent issues outside of office hours, we are not available 24/7.   If you have an urgent issue and are unable to reach [place],  you may choose to seek medical care at your doctor's office, retail clinic, urgent care center, or emergency room.  If you have a medical emergency, please immediately call 911 or go to the emergency department.  Pager Numbers  - Dr. Gwen Pounds: [phone]  - Dr. Neale Burly: [phone]  - Dr. Roseanne Reno: [phone]  In the event of inclement weather, please call our main line at [phone] for an update on the status of any delays or closures.  Dermatology Medication Tips: Please keep the boxes that topical medications come in in order to help keep track of the instructions about where and how to use  these. Pharmacies typically print the medication instructions only on the boxes and not directly on the medication tubes.   If your medication is too expensive, please contact our office at [phone] option 4 or send [place] a message through MyChart.   We are unable to tell what your co-pay for medications will be in advance as this is different depending on your insurance coverage. However, we may be able to find a substitute medication at lower cost or fill out paperwork to get insurance to cover a needed medication.   If a prior authorization is required to get your medication covered by your insurance company, please allow [place] 1-2 business days to complete this process.  Drug prices often vary depending on where the prescription is filled and some pharmacies may offer cheaper prices.  The website www.goodrx.com contains coupons for medications through different pharmacies. The prices here do not account for what the cost may be with help from insurance (it may be cheaper with your insurance), but the website can give you the price if you did not use any insurance.  - You can print the associated coupon and take it with your prescription to the pharmacy.  - You may also stop by our office during regular business hours and pick up a GoodRx coupon card.  - If you need your prescription sent electronically to a different pharmacy, notify our office through [facility] or by phone at [phone] option 4.     Si Usted Necesita Algo Despus de Su Visita  Tambin puede enviarnos un mensaje a travs de Clinical cytogeneticist. Por lo general respondemos a los mensajes de MyChart en el transcurso de 1 a 2 das hbiles.  Para renovar recetas, por favor pida a su farmacia que se ponga en contacto con nuestra oficina. Annie Sable de fax es [place] [phone].  Si tiene un asunto urgente cuando la clnica est cerrada y que no puede esperar hasta el siguiente da hbil, puede llamar/localizar a su doctor(a) al  nmero que aparece a continuacin.   Por favor, tenga en cuenta que aunque hacemos todo lo posible para estar disponibles para asuntos urgentes fuera del horario de [place], no estamos disponibles las 24 horas del da, los 7 [address]  [address] de la [place].   Si tiene un problema urgente y no puede comunicarse con nosotros, puede optar por buscar atencin mdica  en el consultorio de su doctor(a), en una clnica privada, en un centro de atencin urgente o en una sala de emergencias.  Si tiene Engineer, drilling, por favor llame inmediatamente al 911 o vaya a la sala de emergencias.  Nmeros de bper  - Dr. Gwen Pounds: [phone]  - Dra. Moye: [phone]  - Dra. Roseanne Reno: [phone]  En caso de inclemencias del [place], por favor llame a Lacy Duverney principal al [phone] para una actualizacin sobre el estado de cualquier [place] o  cierre.  Consejos para la medicacin en dermatologa: Por favor, guarde las cajas en las que vienen los medicamentos de uso tpico para ayudarle a seguir las instrucciones sobre dnde y cmo usarlos. Las farmacias generalmente imprimen las instrucciones del medicamento slo en las cajas y no directamente en los tubos del [place].   Si su medicamento es muy caro, por favor, pngase en contacto con Rolm Gala llamando al [phone] y presione la opcin 4 o envenos un mensaje a travs de Clinical cytogeneticist.   No podemos decirle cul ser su copago por los medicamentos por adelantado ya que esto es diferente dependiendo de la cobertura de su seguro. Sin embargo, es posible que podamos encontrar un medicamento sustituto a Audiological scientist un formulario para que el seguro cubra el medicamento que se considera necesario.   Si se requiere una autorizacin previa para que su compaa de seguros [place] su medicamento, por favor permtanos de 1 a 2 das hbiles para completar [address]  Los precios de los medicamentos varan con frecuencia dependiendo del Environmental consultant de  dnde se surte la receta y alguna farmacias pueden ofrecer precios ms baratos.  El sitio web www.goodrx.com tiene cupones para medicamentos de Health and safety inspector. Los precios aqu no tienen en cuenta lo que podra costar con la ayuda del seguro (puede ser ms barato con su seguro), pero el sitio web puede darle el precio si no utiliz Tourist information centre manager.  - Puede imprimir el cupn correspondiente y llevarlo con su receta a la farmacia.  - Tambin puede pasar por nuestra oficina durante el horario de atencin regular y Education officer, museum una tarjeta de cupones de GoodRx.  - Si necesita que su receta se enve electrnicamente a una farmacia diferente, informe a nuestra oficina a travs de MyChart de [place] o por telfono llamando al [phone] y presione la opcin 4.

## 2023-02-22 NOTE — Progress Notes (Signed)
Follow-Up Visit   Subjective  Penny Kim is a 52 y.o. female who presents for the following: suture removal and discuss pathology results for punch biopsy performed on the scalp. Patient currently using Fluocinolone oil and Ketoconazole shampoo.   The following portions of the chart were reviewed this encounter and updated as appropriate:   Tobacco  Allergies  Meds  Problems  Med Hx  Surg Hx  Fam Hx      Review of Systems:  No other skin or systemic complaints except as noted in HPI or Assessment and Plan.  Objective  Well appearing patient in no apparent distress; mood and affect are within normal limits.  A focused examination was performed including the scalp. Relevant physical exam findings are noted in the Assessment and Plan.    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 Condition is symptomatic/ bothersome to patient. Not currently at goal.  Seborrheic Dermatitis is a chronic persistent rash characterized by pinkness and scaling most commonly of the mid face but also can occur on the scalp (dandruff), ears; mid chest, mid back and groin.  It tends to be exacerbated by stress and cooler weather.  People who have neurologic disease may experience new onset or exacerbation of existing seborrheic dermatitis.  The condition is not curable but treatable and can be controlled.  Treatment Plan: Continue clobetasol solution or fluocinolone oil daily as needed. Avoid applying to face, groin, and axilla. Use as directed. Long-term use can cause thinning of the skin. Topical steroids (such as triamcinolone, fluocinolone, fluocinonide, mometasone, clobetasol, halobetasol, betamethasone, hydrocortisone) can cause thinning and lightening of the skin if they are used for  too long in the same area. Your physician has selected the right strength medicine for your problem and area affected on the body. Please use your medication only as directed by your physician to prevent side effects.   Start Zoryve cream samples to use daily prn  Given persistent inflammation and significantly bothersome itch, will do short course of doxycycline to calm inflammation  Consider patch testing if persistent   Encounter for Removal of Sutures - Incision site at the scalp is clean, dry and intact - Wound cleansed, sutures removed, wound cleansed and steri strips applied.  - Discussed pathology results showing spongiotic dermatitis  - Patient advised to keep steri-strips dry until they fall off. - Scars remodel for a full year. - Once steri-strips fall off, patient can apply over-the-counter silicone scar cream each night to help with scar remodeling if desired. - Patient advised to call with any concerns or if they notice any new or changing lesions.   MORGELLONS DISEASE   Chronic and persistent condition with duration or expected duration over one year. Condition is bothersome/symptomatic for patient. Currently flared.   Patient describing white strings coming out of her skin and a feeling of itching, stabbing pain and sometimes burning. 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 She asks about this today as well as she has been searching on google and thinks this may be her condition. Discussed treatment of choice is pimozide or another similar medication and that I do not prescribe them, but a good psychiatrist familiar with this condition would be the person to prescribe that type of treatment. She  is agreeable to a referral.    Recommend N-acetylcysteine (NAC) 600 mg supplement three times per day to help with picking  Take doxycycline 100 mg tablet by mouth twice a day for 14 days. #28, 0 refills. This calms  down inflammation for a variety of dermatologic conditions and can improve itch and symptoms with many dermatologic conditions. It has also been reported to be helpful in a case report of Morgellon's  Doxycycline should be taken with food to prevent nausea. Do not lay down for 30 minutes after taking. Be cautious with sun exposure and use good sun protection while on this medication. Pregnant women should not take this medication.    Will refer to psychiatry for further treatment   DERMATITIS   Exam: Scaly pink papules coalescing to plaques at arms  Treatment Plan:  Gentle skin care handout given.   Eczema Skin Care  Buy TWO 16oz jars of CeraVe moisturizing cream  [facility], [facility], [facility] (no prescription needed)  Costs about $15 per jar   Jar #1: Use as a moisturizer as needed. Can be applied to any area of the body. Use twice daily to unaffected areas.  Jar #2: Pour one 50ml bottle of clobetasol 0.05% solution into jar, mix well. Label this jar to indicate the medication has been added. Use twice daily to affected areas. Do not apply to face, groin or underarms.  Moisturizer may burn or sting initially. Try for at least 4 weeks.   Consider patch testing if persistent    Return in about 3 months (around 05/25/2023) for follow up with Dr. Katrinka Blazing.  Maylene Roes, CMA, am acting as scribe for Darden Dates, MD .  Documentation: I have reviewed the above documentation for accuracy and completeness, and I agree with the above.  Darden Dates, MD

## 2023-02-24 ENCOUNTER — Encounter: Payer: Medicare Other | Admitting: Gastroenterology

## 2023-02-24 ENCOUNTER — Telehealth: Payer: Self-pay | Admitting: Gastroenterology

## 2023-02-24 NOTE — Telephone Encounter (Signed)
Pt did not call back to reschedule.

## 2023-02-24 NOTE — Telephone Encounter (Signed)
Patient called in this AM - I called her back but no answer. Message from answering service states patient wanted to cancel her procedure for today with Dr. Barron Alvine. In looking in her chart this is the second time she has cancelled her procedure. Unclear why, she did not answer the phone.   Can someone from the [facility] please call her this morning to see what the issue is and if she can still come in?  [place], [place]

## 2023-02-24 NOTE — Telephone Encounter (Signed)
Phoned pt and LM for her to call us back to let us know if she is planning to come for procedure today and if not for her to call us to reschedule.

## 2023-02-25 ENCOUNTER — Encounter: Payer: Self-pay | Admitting: Dermatology

## 2023-02-25 NOTE — Telephone Encounter (Signed)
Discharge letter and form completed. Placed in your office for signature. Thanks

## 2023-03-02 LAB — FUNGUS CULTURE W SMEAR

## 2023-03-03 ENCOUNTER — Telehealth: Payer: Self-pay

## 2023-03-03 ENCOUNTER — Telehealth: Payer: Self-pay | Admitting: Gastroenterology

## 2023-03-03 NOTE — Telephone Encounter (Signed)
Per Dr. Barron Alvine, I find it necessary to inform you I will no longer be able to provide medical care to you due to multiple missed appointments, 02/25/23

## 2023-03-03 NOTE — Telephone Encounter (Addendum)
  Tried calling patient regarding results. No answer. LMOM for patient to return call.    ----- Message from Sandi Mealy, MD sent at 03/02/2023  6:15 PM EDT ----- Fungal culture showed no fungal infection. No change in treatment needed at this time.  MAs please call. Thank you!

## 2023-03-04 ENCOUNTER — Telehealth: Payer: Self-pay | Admitting: Gastroenterology

## 2023-03-04 NOTE — Telephone Encounter (Signed)
Maddie - did you send this patient revised instructions for this rescheduled date?

## 2023-03-04 NOTE — Telephone Encounter (Signed)
Inbound call from patient stating she needed to reschedule the colonoscopy she missed due to not being able to keep down the prep. Patient was rescheduled for  6/21 at 2:30 and is requesting prep to be sent to pharmacy. Please advise.

## 2023-03-08 NOTE — Telephone Encounter (Signed)
Patient stated she was ok with changing the times on her instructions

## 2023-03-11 ENCOUNTER — Telehealth: Payer: Self-pay | Admitting: Internal Medicine

## 2023-03-11 ENCOUNTER — Other Ambulatory Visit: Payer: Self-pay | Admitting: Dermatology

## 2023-03-11 ENCOUNTER — Encounter: Payer: Medicare Other | Admitting: Gastroenterology

## 2023-03-11 DIAGNOSIS — L219 Seborrheic dermatitis, unspecified: Secondary | ICD-10-CM

## 2023-03-11 MED ORDER — AMLODIPINE BESYLATE 10 MG PO TABS: 10.0000 mg | ORAL_TABLET | Freq: Every day | ORAL | 3 refills | Status: DC

## 2023-03-11 NOTE — Telephone Encounter (Signed)
Requested Prescriptions   Signed Prescriptions Disp Refills   amLODipine (NORVASC) 10 MG tablet 90 tablet 3    Sig: Take 1 tablet (10 mg total) by mouth daily. Needs to keep appt for further refills    Authorizing Provider: END, CHRISTOPHER    Ordering User: Kendrick Fries

## 2023-03-11 NOTE — Telephone Encounter (Signed)
*  STAT* If patient is at the pharmacy, call can be transferred to refill team.   1. Which medications need to be refilled? (please list name of each medication and dose if known) amLODipine (NORVASC) 10 MG tablet   2. Which pharmacy/location (including street and city if local pharmacy) is medication to be sent to? [facility] - [place], [place] - [address]   3. Do they need a 30 day or 90 day supply? 90   Patient medication was knocked over, so she doesn't have any left. Needs a new order for medication

## 2023-03-14 MED ORDER — CLOBETASOL PROPIONATE 0.05 % EX SOLN: CUTANEOUS | 0 refills | Status: DC

## 2023-03-18 ENCOUNTER — Institutional Professional Consult (permissible substitution): Payer: Medicare Other | Admitting: Plastic Surgery

## 2023-03-28 NOTE — Telephone Encounter (Signed)
Pt sent a letter from [facility] on 02/25/23 releasing her from the practice due to multiple cancellations, pt calls today and somehow had a colonoscopy scheduled on 03/30/23 but she has no prep and no instructions and states she needs to cancel bc she gets her meds delivered and will not get the prep in time.  Let pt know about the letter from 02/25/23 and that she is advised to seek a GI closer to her home in [place] so she does not need to keep canceling procdures, pt verb understanding

## 2023-03-28 NOTE — Telephone Encounter (Signed)
Patient needing prep medication sent into the pharmacy

## 2023-03-30 ENCOUNTER — Encounter: Payer: Medicare Other | Admitting: Internal Medicine

## 2023-03-30 ENCOUNTER — Encounter: Payer: Medicare Other | Admitting: Gastroenterology

## 2023-03-31 ENCOUNTER — Other Ambulatory Visit: Payer: Self-pay | Admitting: Dermatology

## 2023-03-31 DIAGNOSIS — L219 Seborrheic dermatitis, unspecified: Secondary | ICD-10-CM

## 2023-04-08 ENCOUNTER — Ambulatory Visit (INDEPENDENT_AMBULATORY_CARE_PROVIDER_SITE_OTHER): Payer: Medicare Other | Admitting: Plastic Surgery

## 2023-04-08 ENCOUNTER — Encounter: Payer: Self-pay | Admitting: Plastic Surgery

## 2023-04-08 VITALS — BP 125/84 | HR 82 | Ht 64.5 in | Wt 117.6 lb

## 2023-04-08 DIAGNOSIS — Z170 Estrogen receptor positive status [ER+]: Secondary | ICD-10-CM | POA: Diagnosis not present

## 2023-04-08 DIAGNOSIS — Z9013 Acquired absence of bilateral breasts and nipples: Secondary | ICD-10-CM | POA: Diagnosis not present

## 2023-04-08 DIAGNOSIS — F1410 Cocaine abuse, uncomplicated: Secondary | ICD-10-CM

## 2023-04-08 DIAGNOSIS — C50211 Malignant neoplasm of upper-inner quadrant of right female breast: Secondary | ICD-10-CM | POA: Diagnosis not present

## 2023-04-08 DIAGNOSIS — M87059 Idiopathic aseptic necrosis of unspecified femur: Secondary | ICD-10-CM

## 2023-04-08 DIAGNOSIS — F1320 Sedative, hypnotic or anxiolytic dependence, uncomplicated: Secondary | ICD-10-CM

## 2023-04-08 DIAGNOSIS — F4010 Social phobia, unspecified: Secondary | ICD-10-CM

## 2023-04-08 NOTE — Progress Notes (Signed)
Patient ID: Penny Kim, female    DOB: September 12, 1971, 52 y.o.   MRN: [id number]   Chief Complaint  Patient presents with   Consult   Breast Problem    The patient is a 52 yrs old female here for evaluation of her breasts.  She underwent breast reconstruction over 10 years ago with Dr. Odis Luster after a mastectomy.  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  She has Mentor smooth saline 650 cc implants in that are filled to 750 cc bilaterally.  She states that her breasts are little bit painful and seems to be getting worse.  She is not aware of any trauma to the area.  She is probably about a 36 C.  She does not smoke.  She has not had any imaging since her original surgery.  I do not feel any lumps or bumps.  She has a Hospital doctor.  She has a little bit of rippling which is to be expected after 10 years.  Her implants I think are a little bit wide and large for her body size.  However the patient states she is not at the same weight as she was when she had the reconstruction.     Review of Systems  Constitutional: Negative.   HENT: Negative.    Eyes: Negative.   Respiratory: Negative.    Cardiovascular: Negative.   Gastrointestinal: Negative.   Endocrine: Negative.   Genitourinary: Negative.   Musculoskeletal: Negative.   Skin: Negative.   Psychiatric/Behavioral: Negative.      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 TTE 10/2016: EF 50-55%, no RWMA, normal LV diastolic function, left atrium  normal, RV systolic function normal, PASP normal   History of stress test    a. treadmill Myoview 11/2016: small defect of mild severity present in the apex location felt to be secondary to breast attenuation. EF 55-65%. Normal study   HTN (hypertension)    Orthostatic hypotension    Psoriasis     Past Surgical History:  Procedure Laterality Date   BREAST RECONSTRUCTION  01/06/2012   Procedure: BREAST RECONSTRUCTION;  Surgeon: Etter Sjogren, MD;  Location: [facility];  Service: Plastics;  Laterality: Bilateral;  bilateral removal of tissue expanders, bilateral placement of implants--Breast   BREAST SURGERY  06/19/2010   exploration of right mastectomy site due to post-op bleeding   INSERTION OF TISSUE EXPANDER AFTER MASTECTOMY  06/19/2010   bilat. mastectomies with bilat. tissue expanders   PORT-A-CATH REMOVAL  11/26/2010   PORTACATH PLACEMENT  07/23/2010   TISSUE EXPANDER REMOVAL  10/20/2010   left      Current Outpatient Medications:    acetaminophen (TYLENOL) 500 MG tablet, Take 1,000 mg by mouth every 6 (six) hours as needed (For hip pain or fever.)., Disp: , Rfl:    Acetylcysteine 600 MG CAPS, Take 1 capsule (600 mg total) by mouth 3 (three) times daily., Disp: 90 capsule, Rfl: 2  acyclovir (ZOVIRAX) 400 MG tablet, Take 400 mg by mouth 3 (three) times daily., Disp: , Rfl:    amLODipine (NORVASC) 10 MG tablet, Take 1 tablet (10 mg total) by mouth daily. Needs to keep appt for further refills, Disp: 90 tablet, Rfl: 3   ciclopirox (LOPROX) 0.77 % cream, Apply topically 2 (two) times daily. To scaly rash at feet, Disp: 90 g, Rfl: 1   clobetasol (TEMOVATE) 0.05 % external solution, APPLY 1 APPLICATION TOPICALLY AS DIRECTED ONCE TO TWICE DAILY AS NEEDED FOR ITCH. Avoid applying to face, groin, and axilla. Use as directed. Long-term use can cause thinning of the, Disp: 50 mL, Rfl: 0   clonazePAM (KLONOPIN) 1 MG tablet, Take 1 mg by mouth 3 (three) times daily as needed., Disp: ,  Rfl:    doxycycline (MONODOX) 100 MG capsule, Take one cap BID x 14 days. Take with food and drink., Disp: 28 capsule, Rfl: 0   gabapentin (NEURONTIN) 300 MG capsule, Take 300 mg by mouth 4 (four) times daily as needed (For pain)., Disp: , Rfl:    hydrOXYzine (ATARAX) 25 MG tablet, Take 1 tablet (25 mg total) by mouth 3 (three) times daily as needed for anxiety., Disp: 30 tablet, Rfl: 0   ketoconazole (NIZORAL) 2 % shampoo, apply once weekly, massage into scalp and leave in for 10 minutes before rinsing out, Disp: 120 mL, Rfl: 2   mupirocin ointment (BACTROBAN) 2 %, Apply 1 Application topically 3 (three) times daily. Apply to affected area., Disp: , Rfl:    ondansetron (ZOFRAN-ODT) 4 MG disintegrating tablet, Take 1 tablet (4 mg total) by mouth every 8 (eight) hours as needed for nausea or vomiting., Disp: 12 tablet, Rfl: 0   traZODone (DESYREL) 50 MG tablet, Take 1 tablet (50 mg total) by mouth at bedtime as needed for sleep., Disp: 30 tablet, Rfl: 0   TRINTELLIX 10 MG TABS tablet, Take 10 mg by mouth daily., Disp: , Rfl:    Objective:   Vitals:   04/08/23 1325  BP: 125/84  Pulse: 82  SpO2: 97%    Physical Exam Vitals and nursing note reviewed.  Constitutional:      Appearance: Normal appearance.  Cardiovascular:     Rate and Rhythm: Normal rate.     Pulses: Normal pulses.  Pulmonary:     Effort: Pulmonary effort is normal.  Musculoskeletal:        General: No swelling.  Skin:    General: Skin is warm.     Capillary Refill: Capillary refill takes less than 2 seconds.     Coloration: Skin is not jaundiced.     Findings: No bruising.  Neurological:     Mental Status: She is alert and oriented to person, place, and time.  Psychiatric:        Mood and Affect: Mood normal.        Behavior: Behavior normal.        Thought Content: Thought content normal.        Judgment: Judgment normal.     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  If it does not show anything we  may need to do MRIs.  Then we can talk about exchanging the implants.  We should talk about saline versus silicone and the size.  Pictures were obtained of the patient and placed in the chart with the patient's or guardian's permission.   Alena Bills Jadaya Sommerfield, DO

## 2023-04-11 ENCOUNTER — Other Ambulatory Visit: Payer: Self-pay | Admitting: Plastic Surgery

## 2023-04-11 DIAGNOSIS — Z170 Estrogen receptor positive status [ER+]: Secondary | ICD-10-CM

## 2023-04-11 DIAGNOSIS — Z9013 Acquired absence of bilateral breasts and nipples: Secondary | ICD-10-CM

## 2023-04-15 ENCOUNTER — Telehealth: Payer: Self-pay | Admitting: Plastic Surgery

## 2023-04-15 NOTE — Telephone Encounter (Signed)
Pt called and would like for you to send a referral for her to see another Plastic Surgeon that can take care of her deviated septum.  She can be reached at [phone]

## 2023-04-18 ENCOUNTER — Other Ambulatory Visit: Payer: Self-pay | Admitting: *Deleted

## 2023-04-18 ENCOUNTER — Inpatient Hospital Stay
Admission: RE | Admit: 2023-04-18 | Discharge: 2023-04-18 | Disposition: A | Discharge status: Home / Self Care | Payer: Self-pay | Source: Ambulatory Visit | Attending: Family Medicine | Admitting: Family Medicine

## 2023-04-18 DIAGNOSIS — Z1231 Encounter for screening mammogram for malignant neoplasm of breast: Secondary | ICD-10-CM

## 2023-04-19 ENCOUNTER — Other Ambulatory Visit: Payer: Self-pay | Admitting: Dermatology

## 2023-04-19 DIAGNOSIS — L309 Dermatitis, unspecified: Secondary | ICD-10-CM

## 2023-04-19 DIAGNOSIS — L219 Seborrheic dermatitis, unspecified: Secondary | ICD-10-CM

## 2023-04-19 MED ORDER — KETOCONAZOLE 2 % EX SHAM
MEDICATED_SHAMPOO | CUTANEOUS | 0 refills | Status: DC
Start: 2023-04-19 — End: 2023-07-15

## 2023-04-19 MED ORDER — FLUOCINOLONE ACETONIDE BODY 0.01 % EX OIL: TOPICAL_OIL | CUTANEOUS | 0 refills | Status: DC

## 2023-04-19 NOTE — Telephone Encounter (Signed)
Patient called requesting refills of Ketoconazole shampoo and Dermasmooth fs oil she uses on her scalp, pt report her scalp has been really flaring up lately, pt has an appt here with Dr Katrinka Blazing in September   Ok refills [facility]

## 2023-04-21 ENCOUNTER — Ambulatory Visit
Admission: RE | Admit: 2023-04-21 | Discharge: 2023-04-21 | Disposition: A | Discharge status: Home / Self Care | Payer: Medicare Other | Source: Ambulatory Visit | Attending: Plastic Surgery | Admitting: Plastic Surgery

## 2023-04-21 DIAGNOSIS — Z9013 Acquired absence of bilateral breasts and nipples: Secondary | ICD-10-CM | POA: Insufficient documentation

## 2023-04-21 DIAGNOSIS — Z170 Estrogen receptor positive status [ER+]: Secondary | ICD-10-CM | POA: Insufficient documentation

## 2023-04-21 DIAGNOSIS — C50211 Malignant neoplasm of upper-inner quadrant of right female breast: Secondary | ICD-10-CM | POA: Diagnosis present

## 2023-04-22 ENCOUNTER — Encounter: Payer: Self-pay | Admitting: Plastic Surgery

## 2023-04-22 ENCOUNTER — Ambulatory Visit (INDEPENDENT_AMBULATORY_CARE_PROVIDER_SITE_OTHER): Payer: Medicare Other | Admitting: Plastic Surgery

## 2023-04-22 DIAGNOSIS — J342 Deviated nasal septum: Secondary | ICD-10-CM

## 2023-04-22 DIAGNOSIS — Z170 Estrogen receptor positive status [ER+]: Secondary | ICD-10-CM

## 2023-04-22 DIAGNOSIS — C50211 Malignant neoplasm of upper-inner quadrant of right female breast: Secondary | ICD-10-CM

## 2023-04-22 DIAGNOSIS — Z9013 Acquired absence of bilateral breasts and nipples: Secondary | ICD-10-CM | POA: Diagnosis not present

## 2023-04-22 NOTE — Progress Notes (Signed)
   Subjective:    Patient ID: Penny Kim, female    DOB: [birth year], 52 y.o.   MRN: [id number]  The patient is a 52 year old female joining me by phone for further discussion about her breasts.  She was seen in early July for reconstruction.  She underwent initial reconstruction 10 years ago after her mastectomies.  She had right-sided DCIS and invasive cell carcinoma.  She has Mentor smooth saline 650 cc implants in place that are filled to 750 cc.  She has a little bit of rippling which is to be expected after 10 years and the implants are little bit wide for her body size.  She is interested in having them exchanged since they are 52 years old.  Due to concerns about pain and no recent imaging we decided to order an ultrasound.  The ultrasound was read as negative.  The patient feels comfortable moving ahead with exchange and would like the implants to be smaller.  She would like to go with saline.     Review of Systems  Constitutional: Negative.   HENT: Negative.    Eyes: Negative.   Respiratory: Negative.    Cardiovascular: Negative.   Gastrointestinal: Negative.   Endocrine: Negative.   Genitourinary: Negative.   Musculoskeletal: Negative.        Objective:   Physical Exam      Assessment & Plan:     ICD-10-CM   1. Malignant neoplasm of upper-inner quadrant of right breast in female, estrogen receptor positive (HCC)  C50.211    Z17.0     2. Acquired absence of bilateral breasts and nipples  Z90.13       Plan for removal replacement of bilateral saline implants.  Patient will end up with smaller implants but still wants to use saline.  Will also refer her to Dr. Sharrie Rothman for rhinoplasty. I connected with  Penny Kim on 04/22/23 by a phone and verified that I am speaking with the correct person using two identifiers.  The patient was at home and I was at the office.  We spent 10 minutes in discussion.   I discussed the limitations of evaluation and management by  telemedicine. The patient expressed understanding and agreed to proceed.

## 2023-04-22 NOTE — Addendum Note (Signed)
Addended by: Zannie Kehr on: 04/22/2023 10:00 AM   Modules accepted: Orders

## 2023-05-16 ENCOUNTER — Other Ambulatory Visit: Payer: Self-pay | Admitting: Dermatology

## 2023-05-17 ENCOUNTER — Telehealth: Payer: Self-pay | Admitting: Plastic Surgery

## 2023-05-17 NOTE — Telephone Encounter (Signed)
Patient called and is returning your call regarding scheduling surgery.  Please call her at [phone]

## 2023-05-26 ENCOUNTER — Ambulatory Visit: Payer: Medicare Other | Admitting: Dermatology

## 2023-06-02 ENCOUNTER — Other Ambulatory Visit: Payer: Self-pay | Admitting: Dermatology

## 2023-06-02 NOTE — H&P (View-Only) (Signed)
Patient ID: Penny Kim, female    DOB: 12/06/1970, 52 y.o.   MRN: [id number]  Chief Complaint  Patient presents with   Pre-op Exam      ICD-10-CM   1. Acquired absence of bilateral breasts and nipples  Z90.13        History of Present Illness: Penny Kim is a 52 y.o.  female  with a history of breast cancer status post breast reconstruction.  She presents for preoperative evaluation for upcoming procedure, bilateral removal and replacement of saline implants with possible excess skin removal, scheduled for 06/16/2023 with Dr. Ulice Bold.  The patient has not had problems with anesthesia.  Patient reports that she does not receive mammograms anymore.  She states that she had an ultrasound a few weeks ago of her breasts and it was negative.  She reports she sees cardiology for possibly hypertension.  Patient states she is not a smoker.  Patient also states that recently, she has been having skin issues (some discomfort and what sounds like rashes), patient is unsure of any sort of diagnosis.  She does state that sometimes she has issues with her skin over her breast.  I did discuss with her that if she is having any active flareups of this issue at the time of surgery, her surgery may get postponed.  Patient expressed understanding.  Patient denies taking any hormone replacement or birth control.  She denies any history of miscarriages.  She reports that she had a cousin who had a DVT, denies any personal history of blood clots.  Denies any personal family history of blood clots or clotting diseases.  She denies any recent surgeries, traumas.  She denies any history of stroke or heart attack.  She denies any history of Crohn's disease or ulcerative colitis.  She denies any history of COPD or asthma.  She denies any varicosities to her lower extremities.  She denies any recent fevers, chills or changes in her health.  Patient reports she is currently a 36C and would like to be slightly  smaller.  Summary of Previous Visit: Patient was initially seen by Dr. Ulice Bold on 04/08/2023.  At this visit, patient reported she underwent breast reconstruction over 10 years ago with Dr. Odis Luster after mastectomy.  Patient had Mentor smooth saline 650 cc implants that were filled to 750 cc bilaterally.  Patient had reported that her breasts were a little bit painful and seem to be getting worse.  She denied any trauma to the area.  Patient reported she was about a 36C.  On exam, there were no lumps or bumps palpated.  There is a little bit of rippling which was to be expected after 10 years.  Due to the concern about pain and no imaging, ultrasounds were ordered to check the breast area.  If they do not show anything, MRIs may need to be done.  Patient then had another appointment with Dr. Ulice Bold on 04/22/2023.  At this visit, ultrasounds that were ordered for the patient were negative.  The patient felt comfortable moving ahead with exchange and stated that she wanted the implants to be smaller.  Patient reported she wanted saline implants.  Job: Not working at this time  PMH Significant for: Hypertension, hypotension, breast cancer, anxiety, depression, insomnia  Patient states that she may need to get a sleep study done soon to test for sleep apnea.  I did discuss the risks of sleep apnea and surgery with her.  Patient expressed  understanding.  Patient states that she has had marks left from lidocaine patch from many years ago.  She is unsure if other dressings will do so, but she wanted to make [place] aware that she is sensitive to some dressings.   Past Medical History: Allergies: Allergies  Allergen Reactions   Prochlorperazine Other (See Comments)    Syncope   Methadone Itching   Propranolol Rash   Tape Rash   Wound Dressing Adhesive Rash    Current Medications:  Current Outpatient Medications:    acetaminophen (TYLENOL) 500 MG tablet, Take 1,000 mg by mouth every 6 (six) hours  as needed (For hip pain or fever.)., Disp: , Rfl:    Acetylcysteine 600 MG CAPS, Take 1 capsule (600 mg total) by mouth 3 (three) times daily., Disp: 90 capsule, Rfl: 2   acyclovir (ZOVIRAX) 400 MG tablet, Take 400 mg by mouth 3 (three) times daily., Disp: , Rfl:    amLODipine (NORVASC) 10 MG tablet, Take 1 tablet (10 mg total) by mouth daily. Needs to keep appt for further refills, Disp: 90 tablet, Rfl: 3   ciclopirox (LOPROX) 0.77 % cream, Apply topically 2 (two) times daily. To scaly rash at feet, Disp: 90 g, Rfl: 1   clobetasol (TEMOVATE) 0.05 % external solution, APPLY 1 APPLICATION TOPICALLY AS DIRECTED ONCE TO TWICE DAILY AS NEEDED FOR ITCH. Avoid applying to face, groin, and axilla. Use as directed. Long-term use can cause thinning of the, Disp: 50 mL, Rfl: 11   clonazePAM (KLONOPIN) 1 MG tablet, Take 1 mg by mouth 3 (three) times daily as needed., Disp: , Rfl:    doxycycline (MONODOX) 100 MG capsule, Take one cap BID x 14 days. Take with food and drink., Disp: 28 capsule, Rfl: 0   Fluocinolone Acetonide Body 0.01 % OIL, Apply to scalp once or twice a day as needed, Disp: 120 mL, Rfl: 0   gabapentin (NEURONTIN) 300 MG capsule, Take 300 mg by mouth 4 (four) times daily as needed (For pain)., Disp: , Rfl:    ketoconazole (NIZORAL) 2 % shampoo, apply once weekly, massage into scalp and leave in for 10 minutes before rinsing out, Disp: 120 mL, Rfl: 0   mupirocin ointment (BACTROBAN) 2 %, Apply 1 Application topically 3 (three) times daily. Apply to affected area., Disp: , Rfl:    TRINTELLIX 10 MG TABS tablet, Take 10 mg by mouth daily., Disp: , Rfl:    hydrOXYzine (ATARAX) 25 MG tablet, Take 1 tablet (25 mg total) by mouth 3 (three) times daily as needed for anxiety., Disp: 30 tablet, Rfl: 0   ondansetron (ZOFRAN-ODT) 4 MG disintegrating tablet, Take 1 tablet (4 mg total) by mouth every 8 (eight) hours as needed for nausea or vomiting., Disp: 12 tablet, Rfl: 0   traZODone (DESYREL) 50 MG tablet,  Take 1 tablet (50 mg total) by mouth at bedtime as needed for sleep., Disp: 30 tablet, Rfl: 0  Past Medical Problems: Past Medical History:  Diagnosis Date   Allergy    AMS (altered mental status) 01/03/2022   Anemia 01/03/2022   Anxiety    Breast cancer, right breast (HCC) 02/2010   s/p chemo (completed 09/2010) and right mastectomy w/reconstruction   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 TTE 10/2016: EF 50-55%, no RWMA, normal LV diastolic function, left atrium normal, RV systolic function normal, PASP normal   History of stress test    a. treadmill Myoview 11/2016: small defect of mild severity present in the apex location felt to be secondary to breast attenuation. EF 55-65%. Normal study   HTN (hypertension)    Orthostatic hypotension    Psoriasis     Past Surgical History: Past Surgical History:  Procedure Laterality Date   BREAST RECONSTRUCTION  01/06/2012   Procedure: BREAST RECONSTRUCTION;  Surgeon: Etter Sjogren, MD;  Location: [facility];  Service: Plastics;  Laterality: Bilateral;  bilateral removal of tissue expanders, bilateral placement of implants--Breast   BREAST SURGERY  06/19/2010   exploration of right mastectomy site due to post-op bleeding   INSERTION OF TISSUE EXPANDER AFTER MASTECTOMY  06/19/2010   bilat. mastectomies with bilat. tissue expanders   PORT-A-CATH REMOVAL  11/26/2010   PORTACATH PLACEMENT  07/23/2010   TISSUE EXPANDER REMOVAL  10/20/2010   left    Social History: Social History   Socioeconomic History   Marital status: Single    Spouse name: Not on file   Number of children: Not on file   Years of education: 18   Highest education level: Not on file  Occupational History   Occupation: Marketing executive Relations    Comment: Psychologist, forensic  Tobacco Use   Smoking status: Never   Smokeless tobacco: Never  Vaping Use   Vaping status: Never Used  Substance and Sexual Activity   Alcohol use: Not Currently    Comment: occasionally   Drug use: No   Sexual activity: Not Currently    Birth control/protection: None  Other Topics Concern   Not on file  Social History Narrative   Penny Kim [place] up [place], [place]. She attended [employer] where she obtained her Bachelors of Science degree in Psychology. 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 She currently lives in [place]. She lives alone. Verlean enjoys attending live musical performances. 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    Social Determinants of Health   Financial Resource Strain: Not on file  Food Insecurity: Not on file  Transportation Needs: Not on file  Physical Activity: Not on file  Stress: Not on file  Social Connections: Unknown (01/29/2022)   Received from [facility]   Social Network    Social Network: Not on file  Intimate Partner Violence: Unknown (12/21/2021)   Received from [facility]   HITS    Physically Hurt: Not on file    Insult or Talk Down To: Not on file    Threaten Physical Harm: Not on file    Scream or Curse: Not on file    Family History: Family History  Problem Relation Age of Onset   Hypertension Mother    Depression Mother    Heart attack Mother    Hyperlipidemia Mother    Cancer Sister 46       Ovarian cancer   Hypertension Maternal Grandmother    Diabetes Maternal Grandmother    Cancer Maternal Grandfather        Lung cancer - Education officer, environmental and smoker   Hypertension Maternal Grandfather    Diabetes Paternal Grandmother    Hypertension Paternal Grandmother    Kidney disease Paternal Grandmother        End  stage renal dz - dialysis    Hyperlipidemia Paternal Grandmother    Breast cancer Paternal Grandmother    Alcohol abuse Father    Depression Father    Drug abuse Father 4       Died of drug overdose    Review of Systems: Denies any recent fevers chills or changes in her health  Physical Exam: Vital Signs BP 115/77 (BP Location: Left Arm, Patient Position: Sitting, Cuff Size: Normal)   Pulse 69   Ht 5' 4.5" (1.638 m)   Wt 124 lb 9.6 oz (56.5 kg)   SpO2 98%   BMI 21.06 kg/m   Physical Exam  Constitutional:      General: Not in acute distress.    Appearance: Normal appearance. Not ill-appearing.  HENT:     Head: Normocephalic and atraumatic.  Neck:     Musculoskeletal: Normal range of motion.  Cardiovascular:     Rate and Rhythm: Normal rate    Pulses: Normal pulses.  Pulmonary:     Effort: Pulmonary effort is normal. No respiratory distress.  Abdominal:     General: Abdomen is flat. There is no distension.  Musculoskeletal: Normal range of motion.  Skin:    General: Skin is warm and dry.     Findings: No erythema or rash.  Neurological:     Mental Status: Alert and oriented to person, place, and time. Mental status is at baseline.  Psychiatric:        Mood and Affect: Mood normal.        Behavior: Behavior normal.    Assessment/Plan: The patient is scheduled for bilateral removal and replacement of saline implants with possible excess skin removal with Dr. Ulice Bold.  Risks, benefits, and alternatives of procedure discussed, questions answered and consent obtained.    Smoking Status: Non-smoker; Counseling Given?  N/A Last Mammogram: N/A status post bilateral mastectomies;  Cardiac and PCP clearances sent  Caprini Score: 8; Risk Factors include: Age, family history of DVT, history of malignancy, and length of planned surgery. Recommendation for mechanical and possible pharmacological prophylaxis. Encourage early ambulation.  Will discuss possibility of postoperative Lovenox with Dr.  Ulice Bold.  Pictures obtained: @consult   Post-op Rx sent to pharmacy: Oxycodone, Zofran, Keflex  Instructed patient to not take her Klonopin and gabapentin in conjunction with oxycodone as this can be sedating.  Patient expressed understanding.  Discussed with patient that she should hold her Klonopin the morning of surgery.  Patient expressed understanding.  Patient was provided with the Mentor consent form, national breast implant registry form, General Surgical Risk consent document and Pain Medication Agreement prior to their appointment.  They had adequate time to read through the risk consent documents and Pain Medication Agreement. We also discussed them in person together during this preop appointment. All of their questions were answered to their satisfaction.  Recommended calling if they have any further questions.  Risk consent form and Pain Medication Agreement to be scanned into patient's chart.  The risk that can be encountered with breast augmentation or maastopexy were discussed and include the following but not limited to these:  Breast asymmetry, fluid accumulation, firmness of the breast, inability to breast feed, loss of nipple or areola, skin loss, decrease or no nipple sensation, fat necrosis of the breast tissue, bleeding, infection, healing delay.  Deep vein thrombosis, cardiac and pulmonary complications are risks to any procedure. The implant can have a faulty position or one different from what you had desired.  The implant can  have rippling, wrinkling, leakage or rupture. There are risks of anesthesia, changes to skin sensation and injury to nerves or blood vessels.  The muscle can be temporarily or permanently injured.  You may have an allergic reaction to tape, suture, glue, blood products which can result in skin discoloration, swelling, pain, skin lesions, poor healing.  Any of these can lead to the need for revisonal surgery or stage procedures.  A reduction has potential  to interfere with diagnostic procedures.  Nipple or breast piercing can increase risks of infection.    This procedure is best done when the breast is fully developed.  Changes in the breast will continue to occur over time.  Pregnancy can alter the outcomes of previous breast reduction surgery, weight gain and weigh loss can also effect the long term appearance. Implants are not guaranteed to last a lifetime.  Future surgery may be required.  Regular examinations of the breast are required to evaluate the condition of your breasts and implants.    Electronically signed by: Laurena Spies, PA-C 06/03/2023 4:12 PM

## 2023-06-02 NOTE — Progress Notes (Unsigned)
Patient ID: Penny Kim, female    DOB: 12-02-1970, 52 y.o.   MRN: [id number]  Chief Complaint  Patient presents with   Pre-op Exam      ICD-10-CM   1. Acquired absence of bilateral breasts and nipples  Z90.13        History of Present Illness: Penny Kim is a 52 y.o.  female  with a history of breast cancer status post breast reconstruction.  She presents for preoperative evaluation for upcoming procedure, bilateral removal and replacement of saline implants with possible excess skin removal, scheduled for 06/16/2023 with Dr. Ulice Bold.  The patient has not had problems with anesthesia.  Patient reports that she does not receive mammograms anymore.  She states that she had an ultrasound a few weeks ago of her breasts and it was negative.  She reports she sees cardiology for possibly hypertension.  Patient states she is not a smoker.  Patient also states that recently, she has been having skin issues (some discomfort and what sounds like rashes), patient is unsure of any sort of diagnosis.  She does state that sometimes she has issues with her skin over her breast.  I did discuss with her that if she is having any active flareups of this issue at the time of surgery, her surgery may get postponed.  Patient expressed understanding.  Patient denies taking any hormone replacement or birth control.  She denies any history of miscarriages.  Summary of Previous Visit: Patient was initially seen by Dr. Ulice Bold on 04/08/2023.  At this visit, patient reported she underwent breast reconstruction over 10 years ago with Dr. Odis Luster after mastectomy.  Patient had Mentor smooth saline 650 cc implants that were filled to 750 cc bilaterally.  Patient had reported that her breasts were a little bit painful and seem to be getting worse.  She denied any trauma to the area.  Patient reported she was about a 36C.  On exam, there were no lumps or bumps palpated.  There is a little bit of rippling which was  to be expected after 10 years.  Due to the concern about pain and no imaging, ultrasounds were ordered to check the breast area.  If they do not show anything, MRIs may need to be done.  Patient then had another appointment with Dr. Ulice Bold on 04/22/2023.  At this visit, ultrasounds that were ordered for the patient were negative.  The patient felt comfortable moving ahead with exchange and stated that she wanted the implants to be smaller.  Patient reported she wanted saline implants.   Job: ***  PMH Significant for: ***   Past Medical History: Allergies: Allergies  Allergen Reactions   Prochlorperazine Other (See Comments)    Syncope   Methadone Itching   Propranolol Rash   Tape Rash   Wound Dressing Adhesive Rash    Current Medications:  Current Outpatient Medications:    acetaminophen (TYLENOL) 500 MG tablet, Take 1,000 mg by mouth every 6 (six) hours as needed (For hip pain or fever.)., Disp: , Rfl:    Acetylcysteine 600 MG CAPS, Take 1 capsule (600 mg total) by mouth 3 (three) times daily., Disp: 90 capsule, Rfl: 2   acyclovir (ZOVIRAX) 400 MG tablet, Take 400 mg by mouth 3 (three) times daily., Disp: , Rfl:    amLODipine (NORVASC) 10 MG tablet, Take 1 tablet (10 mg total) by mouth daily. Needs to keep appt for further refills, Disp: 90 tablet, Rfl: 3  ciclopirox (LOPROX) 0.77 % cream, Apply topically 2 (two) times daily. To scaly rash at feet, Disp: 90 g, Rfl: 1   clobetasol (TEMOVATE) 0.05 % external solution, APPLY 1 APPLICATION TOPICALLY AS DIRECTED ONCE TO TWICE DAILY AS NEEDED FOR ITCH. Avoid applying to face, groin, and axilla. Use as directed. Long-term use can cause thinning of the, Disp: 50 mL, Rfl: 11   clonazePAM (KLONOPIN) 1 MG tablet, Take 1 mg by mouth 3 (three) times daily as needed., Disp: , Rfl:    doxycycline (MONODOX) 100 MG capsule, Take one cap BID x 14 days. Take with food and drink., Disp: 28 capsule, Rfl: 0   Fluocinolone Acetonide Body 0.01 % OIL,  Apply to scalp once or twice a day as needed, Disp: 120 mL, Rfl: 0   gabapentin (NEURONTIN) 300 MG capsule, Take 300 mg by mouth 4 (four) times daily as needed (For pain)., Disp: , Rfl:    ketoconazole (NIZORAL) 2 % shampoo, apply once weekly, massage into scalp and leave in for 10 minutes before rinsing out, Disp: 120 mL, Rfl: 0   mupirocin ointment (BACTROBAN) 2 %, Apply 1 Application topically 3 (three) times daily. Apply to affected area., Disp: , Rfl:    TRINTELLIX 10 MG TABS tablet, Take 10 mg by mouth daily., Disp: , Rfl:    hydrOXYzine (ATARAX) 25 MG tablet, Take 1 tablet (25 mg total) by mouth 3 (three) times daily as needed for anxiety., Disp: 30 tablet, Rfl: 0   ondansetron (ZOFRAN-ODT) 4 MG disintegrating tablet, Take 1 tablet (4 mg total) by mouth every 8 (eight) hours as needed for nausea or vomiting., Disp: 12 tablet, Rfl: 0   traZODone (DESYREL) 50 MG tablet, Take 1 tablet (50 mg total) by mouth at bedtime as needed for sleep., Disp: 30 tablet, Rfl: 0  Past Medical Problems: Past Medical History:  Diagnosis Date   Allergy    AMS (altered mental status) 01/03/2022   Anemia 01/03/2022   Anxiety    Breast cancer, right breast (HCC) 02/2010   s/p chemo (completed 09/2010) and right mastectomy w/reconstruction   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 TTE 10/2016: EF 50-55%, no RWMA, normal LV diastolic function, left atrium normal, RV systolic function normal, PASP normal   History of stress test    a. treadmill Myoview 11/2016: small defect of mild severity present in the apex location felt to be secondary to breast attenuation. EF 55-65%. Normal study   HTN (hypertension)    Orthostatic hypotension    Psoriasis     Past Surgical History: Past Surgical  History:  Procedure Laterality Date   BREAST RECONSTRUCTION  01/06/2012   Procedure: BREAST RECONSTRUCTION;  Surgeon: Etter Sjogren, MD;  Location: [facility];  Service: Plastics;  Laterality: Bilateral;  bilateral removal of tissue expanders, bilateral placement of implants--Breast   BREAST SURGERY  06/19/2010   exploration of right mastectomy site due to post-op bleeding   INSERTION OF TISSUE EXPANDER AFTER MASTECTOMY  06/19/2010   bilat. mastectomies with bilat. tissue expanders   PORT-A-CATH REMOVAL  11/26/2010   PORTACATH PLACEMENT  07/23/2010   TISSUE EXPANDER REMOVAL  10/20/2010   left    Social History: Social History   Socioeconomic History  Marital status: Single    Spouse name: Not on file   Number of children: Not on file   Years of education: 18   Highest education level: Not on file  Occupational History   Occupation: Publishing rights manager Relations    Comment: Psychologist, forensic  Tobacco Use   Smoking status: Never   Smokeless tobacco: Never  Vaping Use   Vaping status: Never Used  Substance and Sexual Activity   Alcohol use: Not Currently    Comment: occasionally   Drug use: No   Sexual activity: Not Currently    Birth control/protection: None  Other Topics Concern   Not on file  Social History Narrative   Caylen [place], [place]. She attended [employer] where she obtained her Bachelors of Science degree in Psychology. 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 She currently lives in [place]. She lives alone. Alia enjoys attending live musical performances. 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    Social Determinants of Health   Financial Resource Strain: Not on file  Food Insecurity: Not on file  Transportation Needs: Not on file  Physical Activity: Not on file  Stress: Not on file  Social Connections: Unknown  (01/29/2022)   Received from [facility]   Social Network    Social Network: Not on file  Intimate Partner Violence: Unknown (12/21/2021)   Received from [employer], [facility]   HITS    Physically Hurt: Not on file    Insult or Talk Down To: Not on file    Threaten Physical Harm: Not on file    Scream or Curse: Not on file    Family History: Family History  Problem Relation Age of Onset   Hypertension Mother    Depression Mother    Heart attack Mother    Hyperlipidemia Mother    Cancer Sister 52       Ovarian cancer   Hypertension Maternal Grandmother    Diabetes Maternal Grandmother    Cancer Maternal Grandfather        Lung cancer - Education officer, environmental and smoker   Hypertension Maternal Grandfather    Diabetes Paternal Grandmother    Hypertension Paternal Grandmother    Kidney disease Paternal Grandmother        End stage renal dz - dialysis   Hyperlipidemia Paternal Grandmother    Breast cancer Paternal Grandmother    Alcohol abuse Father    Depression Father    Drug abuse Father 71       Died of drug overdose    Review of Systems: ROS  Physical Exam: Vital Signs BP 115/77 (BP Location: Left Arm, Patient Position: Sitting, Cuff Size: Normal)   Pulse 69   Ht 5' 4.5" (1.638 m)   Wt 124 lb 9.6 oz (56.5 kg)   SpO2 98%   BMI 21.06 kg/m   Physical Exam *** Constitutional:      General: Not in acute distress.    Appearance: Normal appearance. Not ill-appearing.  HENT:     Head: Normocephalic and atraumatic.  Eyes:     Pupils: Pupils are equal, round Neck:     Musculoskeletal: Normal range of motion.  Cardiovascular:     Rate and Rhythm: Normal rate    Pulses: Normal pulses.  Pulmonary:     Effort: Pulmonary effort is normal. No respiratory distress.  Abdominal:  General: Abdomen is flat. There is no distension.  Musculoskeletal: Normal range of motion.  Skin:    General: Skin is warm and dry.     Findings: No erythema or rash.   Neurological:     General: No focal deficit present.     Mental Status: Alert and oriented to person, place, and time. Mental status is at baseline.     Motor: No weakness.  Psychiatric:        Mood and Affect: Mood normal.        Behavior: Behavior normal.    Assessment/Plan: The patient is scheduled for *** with Dr. {[id number]::"[id number]","[id number]"}.  Risks, benefits, and alternatives of procedure discussed, questions answered and consent obtained.    Smoking Status: ***; Counseling Given? *** Last Mammogram: ***; Results: ***  Caprini Score: ***; Risk Factors include: ***, BMI *** 25, and length of planned surgery. Recommendation for mechanical *** prophylaxis. Encourage early ambulation.   Pictures obtained: @consult ***  Post-op Rx sent to pharmacy: Oxycodone, Zofran, Keflex  Instructed patient to not take her Klonopin and gabapentin in conjunction with oxycodone as this can be sedating.  Patient expressed understanding.  Discussed with patient that she should hold her Klonopin the morning of surgery.  Patient expressed understanding.  Patient was provided with the *** General Surgical Risk consent document and Pain Medication Agreement prior to their appointment.  They had adequate time to read through the risk consent documents and Pain Medication Agreement. We also discussed them in person together during this preop appointment. All of their questions were answered to their satisfaction.  Recommended calling if they have any further questions.  Risk consent form and Pain Medication Agreement to be scanned into patient's chart.  ***   Electronically signed by: Laurena Spies, PA-C 06/03/2023 4:12 PM

## 2023-06-03 ENCOUNTER — Other Ambulatory Visit: Payer: Self-pay | Admitting: Dermatology

## 2023-06-03 ENCOUNTER — Ambulatory Visit (INDEPENDENT_AMBULATORY_CARE_PROVIDER_SITE_OTHER): Payer: Medicare Other | Admitting: Student

## 2023-06-03 ENCOUNTER — Encounter: Payer: Self-pay | Admitting: Student

## 2023-06-03 VITALS — BP 115/77 | HR 69 | Ht 64.5 in | Wt 124.6 lb

## 2023-06-03 DIAGNOSIS — L309 Dermatitis, unspecified: Secondary | ICD-10-CM

## 2023-06-03 DIAGNOSIS — Z9013 Acquired absence of bilateral breasts and nipples: Secondary | ICD-10-CM

## 2023-06-03 MED ORDER — ONDANSETRON HCL 4 MG PO TABS: 4.0000 mg | ORAL_TABLET | Freq: Three times a day (TID) | ORAL | 0 refills | Status: DC | PRN

## 2023-06-03 MED ORDER — OXYCODONE HCL 5 MG PO TABS
5.0000 mg | ORAL_TABLET | Freq: Four times a day (QID) | ORAL | 0 refills | Status: DC | PRN
Start: 2023-06-03 — End: 2023-07-15

## 2023-06-03 MED ORDER — CEPHALEXIN 500 MG PO CAPS: 500.0000 mg | ORAL_CAPSULE | Freq: Four times a day (QID) | ORAL | 0 refills | Status: AC

## 2023-06-06 ENCOUNTER — Telehealth: Payer: Self-pay | Admitting: Nurse Practitioner

## 2023-06-06 NOTE — Telephone Encounter (Signed)
Pre-operative Risk Assessment    Patient Name: Penny Kim  DOB: [birth year] MRN: [id number]      Request for Surgical Clearance    Procedure:   bilateral  implanct removel/replacement  Date of Surgery:  Clearance 06/16/23                                 Surgeon:  not indicated Surgeon's Group or Practice Name:  [facility] Phone number:  [phone] Fax number:  [phone]   Type of Clearance Requested:   - Medical    Type of Anesthesia:  Not Indicated   Additional requests/questions:    Burnett Sheng   06/06/2023, 8:38 AM

## 2023-06-06 NOTE — Telephone Encounter (Signed)
Name: Penny Kim  DOB: [birth year]  MRN: [id number]  Primary Cardiologist: Yvonne Kendall, MD   Preoperative team, please contact this patient and set up a phone call appointment for further preoperative risk assessment. Please obtain consent and complete medication review. Thank you for your help.  Rip Harbour, NP 06/06/2023, 11:18 AM [facility]

## 2023-06-06 NOTE — Telephone Encounter (Signed)
Pt was in a therapy assessment. Will call back later.

## 2023-06-08 NOTE — Telephone Encounter (Signed)
Lvm for patient to call back to make appointment.

## 2023-06-09 ENCOUNTER — Encounter (HOSPITAL_BASED_OUTPATIENT_CLINIC_OR_DEPARTMENT_OTHER): Payer: Self-pay | Admitting: Plastic Surgery

## 2023-06-09 ENCOUNTER — Telehealth: Payer: Self-pay | Admitting: Plastic Surgery

## 2023-06-09 NOTE — Telephone Encounter (Addendum)
Spoke with patient who has appointment Monday 06-13-23 with Dr Hyacinth Meeker and will pursue clearance on that day due surgery 9-26

## 2023-06-09 NOTE — Telephone Encounter (Signed)
I called the patient.  She states that she started taking her medications that were prescribed for her for after surgery.  She states that she has been taking them for 2 days.  I discussed with the patient that all of these medications are for after surgery.  I discussed with her that the antibiotic (Keflex) she will take after surgery as directed on the label and finish the entire course of it.  I discussed with her that the Zofran is as needed for nausea and vomiting, and the oxycodone is as needed for pain only after she has taken Tylenol and/or ibuprofen and is still experiencing pain.  Patient expressed understanding and understands that she is not to take any of these medications prior to surgery.

## 2023-06-09 NOTE — Telephone Encounter (Signed)
Pt is having problem with medication and is requesting a call back from dr. Algis Downs

## 2023-06-14 ENCOUNTER — Telehealth: Payer: Self-pay

## 2023-06-14 ENCOUNTER — Telehealth: Payer: Self-pay | Admitting: Internal Medicine

## 2023-06-14 ENCOUNTER — Telehealth: Payer: Self-pay | Admitting: Nurse Practitioner

## 2023-06-14 NOTE — Telephone Encounter (Signed)
  Patient Consent for Virtual Visit        Penny Kim has provided verbal consent on 06/14/2023 for a virtual visit (video or telephone).   CONSENT FOR VIRTUAL VISIT FOR:  Penny Kim  By participating in this virtual visit I agree to the following:  I hereby voluntarily request, consent and authorize [facility] and its employed or contracted physicians, physician assistants, nurse practitioners or other licensed health care professionals (the Practitioner), to provide me with telemedicine health care services (the "Services") as deemed necessary by the treating Practitioner. I acknowledge and consent to receive the Services by the Practitioner via telemedicine. I understand that the telemedicine visit will involve communicating with the Practitioner through live audiovisual communication technology and the disclosure of certain medical information by electronic transmission. I acknowledge that I have been given the opportunity to request an in-person assessment or other available alternative prior to the telemedicine visit and am voluntarily participating in the telemedicine visit.  I understand that I have the right to withhold or withdraw my consent to the use of telemedicine in the course of my care at any time, without affecting my right to future care or treatment, and that the Practitioner or I may terminate the telemedicine visit at any time. I understand that I have the right to inspect all information obtained and/or recorded in the course of the telemedicine visit and may receive copies of available information for a reasonable fee.  I understand that some of the potential risks of receiving the Services via telemedicine include:  Delay or interruption in medical evaluation due to technological equipment failure or disruption; Information transmitted may not be sufficient (e.g. poor resolution of images) to allow for appropriate medical decision making by the Practitioner;  and/or  In rare instances, security protocols could fail, causing a breach of personal health information.  Furthermore, I acknowledge that it is my responsibility to provide information about my medical history, conditions and care that is complete and accurate to the best of my ability. I acknowledge that Practitioner's advice, recommendations, and/or decision may be based on factors not within their control, such as incomplete or inaccurate data provided by me or distortions of diagnostic images or specimens that may result from electronic transmissions. I understand that the practice of medicine is not an exact science and that Practitioner makes no warranties or guarantees regarding treatment outcomes. I acknowledge that a copy of this consent can be made available to me via my patient portal [facility] MyChart), or I can request a printed copy by calling the office of  [facility].    I understand that my insurance will be billed for this visit.   I have read or had this consent read to me. I understand the contents of this consent, which adequately explains the benefits and risks of the Services being provided via telemedicine.  I have been provided ample opportunity to ask questions regarding this consent and the Services and have had my questions answered to my satisfaction. I give my informed consent for the services to be provided through the use of telemedicine in my medical care

## 2023-06-14 NOTE — Progress Notes (Signed)
   06/09/23 1347  PAT Phone Screen  Is the patient taking a GLP-1 receptor agonist? No  Do You Have Diabetes? No  Do You Have Hypertension? Yes  Have You Ever Been to the ER for Asthma? No  Have You Taken Oral Steroids in the Past 3 Months? No  Do you Take Phenteramine or any Other Diet Drugs? No  Recent  Lab Work, EKG, CXR? No  Do you have a history of heart problems? Yes  Cardiologist Name unsure of physician names- getting clearance 06/13/23  Have you ever had tests on your heart? Yes  What cardiac tests were performed? Echo;Stress Test  What date/year were cardiac tests completed? 11-08-16 ECHO EF 50-55%, 11-19-16 NM Myocardial stress test- low risk  Results viewable: [facility] Media Tab  Any Recent Hospitalizations? No  Height 5' 4.5" (1.638 m)  Weight 56.7 kg  Pat Appointment Scheduled No  Reason for No Appointment Not Needed

## 2023-06-14 NOTE — Telephone Encounter (Signed)
See pre op clearance notes

## 2023-06-14 NOTE — Telephone Encounter (Signed)
Office returning nurses phone call regarding clearance. Please advise

## 2023-06-14 NOTE — Telephone Encounter (Signed)
Pre-op team,   Will you please contact the requesting office to clarify if they are looking for medical cardiac clearance? It appears the patient was to be set up for a virtual visit but was pursing clearance from a different physician (see encounter for clearance dated 06/06/2023.   If she needs cardiac clearance, she will need to have a virtual visit tomorrow in a provider slot.   Thank you!   Etta Grandchild. Auron Tadros, DNP, NP-C  06/14/2023, 9:53 AM [facility] [address] Office [phone] Fax [phone]

## 2023-06-14 NOTE — Telephone Encounter (Signed)
Spoke with the requesting provider's office and they state that patient does need a cardiac clearance. Per Penny Levering, NP, it's okay to schedule patient tomorrow 9/25 at 3:40 pm. Spoke with patient who is agreeable to do a tele visit on to doing a tele visit on 9/25 at 3:40 pm. Med rec and consent done.

## 2023-06-14 NOTE — Telephone Encounter (Signed)
I left a message for the requesting provide's office to call out office back for clarification on clearance

## 2023-06-14 NOTE — Telephone Encounter (Signed)
Pre-operative Risk Assessment    Patient Name: Penny Kim  DOB: [birth year] MRN: [id number]      Request for Surgical Clearance    Procedure:   biateral removal of implants and possible excess skin removal  Date of Surgery:  Clearance 06/16/23                                 Surgeon:  not indicated [facility]'s [facility] Practice [facility] Phone number:  [phone] Fax number:  [phone]   Type of Clearance Requested:   - Pharmacy:  Hold trintellix,gabapentin,clonazepam  you can start trintellix next day  after surgery, gabapentin start med the following day, clonazepam restart post surgery at the lowest frequer poss while taking narcotics to avoid withdrawals and cns depressior.   But take amlodipine 10 mg the morning of surgery   Type of Anesthesia:  Not Indicated   Additional requests/questions:    Signed, Shawna Orleans   06/14/2023, 8:49 AM

## 2023-06-15 ENCOUNTER — Ambulatory Visit: Payer: Medicare Other | Attending: Cardiology | Admitting: Physician Assistant

## 2023-06-15 ENCOUNTER — Ambulatory Visit (INDEPENDENT_AMBULATORY_CARE_PROVIDER_SITE_OTHER): Payer: Medicare Other | Admitting: Dermatology

## 2023-06-15 ENCOUNTER — Encounter: Payer: Self-pay | Admitting: Dermatology

## 2023-06-15 DIAGNOSIS — L299 Pruritus, unspecified: Secondary | ICD-10-CM

## 2023-06-15 DIAGNOSIS — Z01810 Encounter for preprocedural cardiovascular examination: Secondary | ICD-10-CM

## 2023-06-15 MED ORDER — FLUOCINOLONE ACETONIDE BODY 0.01 % EX OIL: TOPICAL_OIL | CUTANEOUS | 5 refills | Status: DC

## 2023-06-15 NOTE — Progress Notes (Signed)
Virtual Visit via Telephone Note   Because of Penny Kim's co-morbid illnesses, she is at least at moderate risk for complications without adequate follow up.  This format is felt to be most appropriate for this patient at this time.  The patient did not have access to video technology/had technical difficulties with video requiring transitioning to audio format only (telephone).  All issues noted in this document were discussed and addressed.  No physical exam could be performed with this format.  Please refer to the patient's chart for her consent to telehealth for [facility].  Evaluation Performed:  Preoperative cardiovascular risk assessment _____________   Date:  06/15/2023   Patient ID:  Penny Kim, DOB 12/04/1970, MRN [id number] Patient Location:  Home Provider location:   Office  Primary Care Provider:  Leanna Sato, MD Primary Cardiologist:  Yvonne Kendall, MD  Chief Complaint / Patient Profile   52 y.o. y/o female with a h/o orthostatic hypotension and syncope, breast cancer status post bilateral mastectomies with reconstruction chemotherapy, cocaine abuse, anxiety, and depression who is pending bilateral removal of implants and possible excess skin removal and presents today for telephonic preoperative cardiovascular risk assessment.  History of Present Illness    Penny Kim is a 52 y.o. female who presents via audio/video conferencing for a telehealth visit today.  Pt was last seen in cardiology clinic on 02/02/23 by Ward Givens NP. She has long history of atypical chest pain dating back to breast cancer diagnosis and reconstruction with reassuring stress testing last in 2018. At that time NANETTE HURRELL was doing well. The patient is now pending procedure as outlined above. Since her last visit, she has not had any new chest pain, SOB, palpitations or syncope. She is regularly physically active and goes up and down stairs to her residence frequently without  any cardiac limitation. She reports she saw her PCP yesterday and had an EKG done. We do not have a copy for our review but she reports PCP sent it to Dr. Kittie Plater office and she was told it was normal.  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 TTE 10/2016: EF 50-55%, no RWMA, normal LV diastolic function, left atrium normal, RV systolic function normal, PASP normal   History of stress test    a. treadmill Myoview 11/2016: small defect of mild severity present in the apex location felt to be secondary to breast attenuation. EF 55-65%. Normal study   HTN (hypertension)    Orthostatic hypotension    Psoriasis    Past Surgical History:  Procedure Laterality Date   BREAST RECONSTRUCTION  01/06/2012   Procedure: BREAST RECONSTRUCTION;  Surgeon: Etter Sjogren, MD;  Location: [facility];  Service: Plastics;  Laterality: Bilateral;  bilateral removal of tissue expanders, bilateral placement of implants--Breast   BREAST SURGERY  06/19/2010   exploration of right mastectomy site due to post-op bleeding   INSERTION OF TISSUE EXPANDER  AFTER MASTECTOMY  06/19/2010   bilat. mastectomies with bilat. tissue expanders   PORT-A-CATH REMOVAL  11/26/2010   PORTACATH PLACEMENT  07/23/2010   TISSUE EXPANDER REMOVAL  10/20/2010   left    Allergies  Allergies  Allergen Reactions   Prochlorperazine Other (See Comments)    Syncope   Propranolol Rash   Tape Rash   Wound Dressing Adhesive Rash    Home Medications     Prior to Admission medications   Medication Sig Start Date End Date Taking? Authorizing Provider  acyclovir (ZOVIRAX) 400 MG tablet Take 400 mg by mouth 3 (three) times daily. 07/22/22   [provider]  amLODipine (NORVASC) 10 MG tablet Take 1 tablet (10 mg total) by mouth daily. Needs to keep appt for further refills 03/11/23   End, Cristal Deer, MD  ciclopirox (LOPROX) 0.77 % cream Apply topically 2 (two) times daily. To scaly rash at feet 12/27/22   Moye, [place], MD  clobetasol (TEMOVATE) 0.05 % external solution APPLY 1 APPLICATION TOPICALLY AS DIRECTED ONCE TO TWICE DAILY AS NEEDED FOR ITCH. Avoid applying to face, groin, and axilla. Use as directed. Long-term use can cause thinning of the 04/19/23   Deirdre Evener, MD  doxycycline (MONODOX) 100 MG capsule Take one cap BID x 14 days. Take with food and drink. 02/22/23   Moye, [place], MD  Fluocinolone Acetonide Body 0.01 % OIL Apply to scalp once or twice a day as needed 06/02/23   Deirdre Evener, MD  gabapentin (NEURONTIN) 300 MG capsule Take 300 mg by mouth 4 (four) times daily as needed (For pain). 01/31/21   [provider]  ketoconazole (NIZORAL) 2 % shampoo apply once weekly, massage into scalp and leave in for 10 minutes before rinsing out 04/19/23   Deirdre Evener, MD  mupirocin ointment (BACTROBAN) 2 % Apply 1 Application topically 3 (three) times daily. Apply to affected area. 08/31/22   [provider]  ondansetron (ZOFRAN) 4 MG tablet Take 1 tablet (4 mg total) by mouth every 8 (eight) hours as needed for up to 20 doses for nausea or vomiting. 06/03/23   Laurena Spies, PA-C  ondansetron (ZOFRAN-ODT) 4 MG disintegrating tablet Take 1 tablet (4 mg total) by mouth every 8 (eight) hours as needed for nausea or vomiting. 12/15/22   Shaune Pollack, MD  oxyCODONE (ROXICODONE) 5 MG immediate release tablet Take 1 tablet (5 mg total) by mouth every 6 (six) hours as needed for up to 20 doses for severe pain.  06/03/23   Caroline More E, PA-C  TRINTELLIX 10 MG TABS tablet Take 10 mg by mouth daily. 12/15/21   [provider]    Physical Exam    Vital Signs:  Penny Kim does not have vital signs available for review today.  Given telephonic nature of communication, physical exam is limited. AAOx3. NAD. Normal affect.  Speech and respirations are unlabored.  Accessory Clinical Findings    None  Assessment & Plan    1.  Preoperative Cardiovascular Risk Assessment: RCRI is 0 indicating low CV risk, 3.9% risk of 30-day risk of death, MI, or cardiac arrest. The patient affirms she has been doing well without any new cardiac symptoms. They are able to achieve over 4 METS without cardiac limitations. She did mention that she had a baseline EKG this week by PCP whom she states sent it to the plastic surgery team for review and was informed it was normal. Our office called the [facility]  team and CMA left message for surgical scheduler. She also called the primary care office who no longer had the hard copy EKG available to send [place]. Therefore, we were unable to review this personally but barring any acute concerns from the team that reviewed this, based on ACC/AHA guidelines, the patient would be at acceptable risk for the planned procedure without further cardiovascular testing. The patient was advised that if she develops new symptoms prior to surgery to contact our office to arrange for a follow-up visit, and she verbalized understanding.   She is not on any antiplatelets or anticoagulants needing to be hold.  The preop clearance request included the following commentary: "Hold trintellix ,gabapentin,clonazepam  you can start trintellix next day  after surgery, gabapentin start med the following day, clonazepam restart post surgery at the lowest frequer poss while taking narcotics to avoid withdrawals and cns depressior.   But take amlodipine 10 mg the morning of surgery." We will  defer to surgeon to advise patient non-cardiac medicines.  A copy of this note will be routed to requesting surgeon.  Time:   Today, I have spent 6 minutes with the patient with telehealth technology discussing medical history, symptoms, and management plan.     Laurann Montana, PA-C  06/15/2023, 10:04 AM

## 2023-06-15 NOTE — Patient Instructions (Addendum)
[facility] [phone], [phone] Fax # [phone] [facility] [phone] [facility] [phone]  [facility] [phone] [facility] [phone]   [facility]: [address], [place], [place] [address] Phone # [phone]       Fax # [phone]   [facility]youth and [facility] - [facility]) [phone] [address]   [place], [place] [address] Phone # [phone]   Fax # [phone]  Due to recent changes in healthcare laws, you may see results of your pathology and/or laboratory studies on MyChart before the doctors have had a chance to review them. We understand that in some cases there may be results that are confusing or concerning to you. Please understand that not all results are received at the same time and often the doctors may need to interpret multiple results in order to provide you with the best plan of care or course of treatment. Therefore, we ask that you please give [place] 2 business days to thoroughly review all your results before contacting the office for clarification. Should we see a critical lab result, you will be contacted sooner.   If You Need Anything After Your Visit  If you have any questions or concerns for your doctor, please call our main line at [phone] and press option 4 to reach your doctor's medical assistant. If no one answers, please leave a voicemail as directed and we will return your call as soon as possible. Messages left after 4 pm will be answered the following business day.   You may also send [place] a message via MyChart. We typically respond to MyChart messages within 1-2 business days.  For prescription refills, please ask your pharmacy to contact our office. Our fax number is [phone].  If you have an urgent issue when the clinic is closed that cannot wait until the next business day, you can page your doctor at the number below.    Please note that while we do our  best to be available for urgent issues outside of office hours, we are not available 24/7.   If you have an urgent issue and are unable to reach [place], you may choose to seek medical care at your doctor's office, retail clinic, urgent care center, or emergency room.  If you have a medical emergency, please immediately call 911 or go to the emergency department.  Pager Numbers  - Dr. Gwen Pounds: [phone]  - Dr. Roseanne Reno: [phone]  - Dr. Katrinka Blazing: [phone]   In the event of inclement weather, please call our main line at [phone] for an update on the status of any delays or closures.  Dermatology Medication Tips: Please keep the boxes that topical medications come in in order to help keep track of the instructions about where and how to use these. Pharmacies typically print the medication instructions only on the boxes and not directly on the medication tubes.   If your medication is too expensive, please contact our office at [phone] option 4 or send [place] a message through MyChart.   We are unable to tell what your co-pay for medications will be in advance as this is different depending on your insurance coverage. However, we may be able to find a substitute medication at lower cost or fill out paperwork to get insurance to cover a needed medication.   If a prior authorization is required to get your medication covered by your insurance company, please allow [place] 1-2 business days to complete this process.  Drug prices  often vary depending on where the prescription is filled and some pharmacies may offer cheaper prices.  The website www.goodrx.com contains coupons for medications through different pharmacies. The prices here do not account for what the cost may be with help from insurance (it may be cheaper with your insurance), but the website can give you the price if you did not use any insurance.  - You can print the associated coupon and take it with your prescription to the  pharmacy.  - You may also stop by our office during regular business hours and pick up a GoodRx coupon card.  - If you need your prescription sent electronically to a different pharmacy, notify our office through [facility] or by phone at [phone] option 4.

## 2023-06-15 NOTE — Addendum Note (Signed)
Addended by: Laurann Montana on: 06/15/2023 04:15 PM   Modules accepted: Level of Service

## 2023-06-15 NOTE — Progress Notes (Unsigned)
   Follow-Up Visit   Subjective  Penny Kim is a 52 y.o. female who presents for the following: dermatitis at scalp and hands. Using clobetasol solution at scalp. She has used ketoconazole shampoo and would like refills. Patient thinks that the fluocinolone oil may help better. Patient feels like she gets relief for about 20 minutes after using but then the itching started again. Feels like parasites are in the scalp and are trying to come out. Patient wants to talk about injections for the itching.  Patient advises that after using the topicals her mind clears and the itching is better but then everything becomes active again after about 20 minutes. She feels like she is going to jump out of her skin.  Referral was sent to psych and they responded that patient needs a higher level of care with recommendations. Message with this information was not seen by our office. Will look into further.   The following portions of the chart were reviewed this encounter and updated as appropriate: medications, allergies, medical history  Review of Systems:  No other skin or systemic complaints except as noted in HPI or Assessment and Plan.  Objective  Well appearing patient in no apparent distress; mood and affect are within normal limits.   A focused examination was performed of the following areas: Scalp, arms, hands, legs, back  Relevant exam findings are noted in the Assessment and Plan.  Scalp Clear. No erythema, scaling, crusting, or lesions noted. No alopecia.    Assessment & Plan     Itching Scalp  Patient advised that the skin is healthy and that if the skin is healthy that means the itching is coming from somewhere else, possibly nerves or psychogenic. Patient feels that it is a skin issue because it started after being exposed to dust. She thinks that it is worse with electricity or phone use. She asks to continue to be followed by our office and that we do no give up on her.    Patient said she did not hear back from psych when we sent referral. Will send referral to one of the recommended providers. She is aware that it could be [facility].   Patient would like to continue with fluocinolone oil to scalp despite that she can feel her scalp rejecting the oil.   Patient has appointment with [facility] 10/[facility].  Return if symptoms worsen or fail to improve.  Penny Kim, RMA, am acting as scribe for Elie Goody, MD .   Documentation: I have reviewed the above documentation for accuracy and completeness, and I agree with the above.  Elie Goody, MD

## 2023-06-16 ENCOUNTER — Ambulatory Visit (HOSPITAL_BASED_OUTPATIENT_CLINIC_OR_DEPARTMENT_OTHER): Payer: Medicare Other | Admitting: Anesthesiology

## 2023-06-16 ENCOUNTER — Other Ambulatory Visit: Payer: Self-pay

## 2023-06-16 ENCOUNTER — Ambulatory Visit (HOSPITAL_BASED_OUTPATIENT_CLINIC_OR_DEPARTMENT_OTHER)
Admission: RE | Admit: 2023-06-16 | Discharge: 2023-06-16 | Disposition: A | Discharge status: Home / Self Care | Payer: Medicare Other | Attending: Plastic Surgery | Admitting: Plastic Surgery

## 2023-06-16 ENCOUNTER — Encounter (HOSPITAL_BASED_OUTPATIENT_CLINIC_OR_DEPARTMENT_OTHER): Payer: Self-pay | Admitting: Plastic Surgery

## 2023-06-16 ENCOUNTER — Encounter (HOSPITAL_BASED_OUTPATIENT_CLINIC_OR_DEPARTMENT_OTHER)
Admission: RE | Disposition: A | Discharge status: Home / Self Care | Payer: Self-pay | Source: Home / Self Care | Attending: Plastic Surgery

## 2023-06-16 ENCOUNTER — Encounter: Payer: Self-pay | Admitting: Dermatology

## 2023-06-16 DIAGNOSIS — F419 Anxiety disorder, unspecified: Secondary | ICD-10-CM | POA: Diagnosis not present

## 2023-06-16 DIAGNOSIS — I10 Essential (primary) hypertension: Secondary | ICD-10-CM | POA: Insufficient documentation

## 2023-06-16 DIAGNOSIS — Z9013 Acquired absence of bilateral breasts and nipples: Secondary | ICD-10-CM | POA: Diagnosis not present

## 2023-06-16 DIAGNOSIS — Z01818 Encounter for other preprocedural examination: Secondary | ICD-10-CM

## 2023-06-16 DIAGNOSIS — Z803 Family history of malignant neoplasm of breast: Secondary | ICD-10-CM | POA: Insufficient documentation

## 2023-06-16 DIAGNOSIS — Z421 Encounter for breast reconstruction following mastectomy: Secondary | ICD-10-CM | POA: Insufficient documentation

## 2023-06-16 DIAGNOSIS — F32A Depression, unspecified: Secondary | ICD-10-CM | POA: Diagnosis not present

## 2023-06-16 DIAGNOSIS — Z9221 Personal history of antineoplastic chemotherapy: Secondary | ICD-10-CM | POA: Insufficient documentation

## 2023-06-16 DIAGNOSIS — K219 Gastro-esophageal reflux disease without esophagitis: Secondary | ICD-10-CM | POA: Insufficient documentation

## 2023-06-16 DIAGNOSIS — Z853 Personal history of malignant neoplasm of breast: Secondary | ICD-10-CM | POA: Diagnosis not present

## 2023-06-16 HISTORY — PX: BREAST IMPLANT REMOVAL: SHX5361

## 2023-06-16 HISTORY — PX: PLACEMENT OF BREAST IMPLANTS: SHX6334

## 2023-06-16 SURGERY — INSERTION, IMPLANT, BREAST
Anesthesia: General | Site: Breast | Laterality: Bilateral

## 2023-06-16 MED ORDER — FENTANYL CITRATE (PF) 100 MCG/2ML IJ SOLN
INTRAMUSCULAR | Status: AC
  Filled 2023-06-16: qty 2

## 2023-06-16 MED ORDER — SODIUM CHLORIDE 0.9% FLUSH: 3.0000 mL | INTRAVENOUS | Status: DC | PRN

## 2023-06-16 MED ORDER — ACETAMINOPHEN 325 MG PO TABS: 650.0000 mg | ORAL_TABLET | ORAL | Status: DC | PRN

## 2023-06-16 MED ORDER — EPHEDRINE SULFATE (PRESSORS) 50 MG/ML IJ SOLN
INTRAMUSCULAR | Status: DC | PRN
Start: 2023-06-16 — End: 2023-06-16
  Administered 2023-06-16: 10 mg via INTRAVENOUS

## 2023-06-16 MED ORDER — SODIUM CHLORIDE 0.9% FLUSH: 3.0000 mL | Freq: Two times a day (BID) | INTRAVENOUS | Status: DC

## 2023-06-16 MED ORDER — CEFAZOLIN SODIUM-DEXTROSE 2-4 GM/100ML-% IV SOLN
2.0000 g | INTRAVENOUS | Status: AC
  Administered 2023-06-16: 2 g via INTRAVENOUS

## 2023-06-16 MED ORDER — ACETAMINOPHEN 325 MG RE SUPP: 650.0000 mg | RECTAL | Status: DC | PRN

## 2023-06-16 MED ORDER — LIDOCAINE 2% (20 MG/ML) 5 ML SYRINGE
INTRAMUSCULAR | Status: AC
  Filled 2023-06-16: qty 5

## 2023-06-16 MED ORDER — CEFAZOLIN SODIUM-DEXTROSE 2-4 GM/100ML-% IV SOLN
INTRAVENOUS | Status: AC
  Filled 2023-06-16: qty 100

## 2023-06-16 MED ORDER — FLUOCINOLONE ACETONIDE SCALP 0.01 % EX OIL: TOPICAL_OIL | CUTANEOUS | 2 refills | Status: DC

## 2023-06-16 MED ORDER — VASHE WOUND IRRIGATION OPTIME
TOPICAL | Status: DC | PRN
  Administered 2023-06-16: 34 [oz_av]

## 2023-06-16 MED ORDER — MIDAZOLAM HCL 2 MG/2ML IJ SOLN
INTRAMUSCULAR | Status: AC
  Filled 2023-06-16: qty 2

## 2023-06-16 MED ORDER — DEXMEDETOMIDINE HCL IN NACL 80 MCG/20ML IV SOLN
INTRAVENOUS | Status: DC | PRN
Start: 2023-06-16 — End: 2023-06-16
  Administered 2023-06-16: 4 ug via INTRAVENOUS
  Administered 2023-06-16 (×3): 8 ug via INTRAVENOUS
  Administered 2023-06-16: 4 ug via INTRAVENOUS

## 2023-06-16 MED ORDER — PROPOFOL 10 MG/ML IV BOLUS
INTRAVENOUS | Status: AC
  Filled 2023-06-16: qty 20

## 2023-06-16 MED ORDER — ONDANSETRON HCL 4 MG/2ML IJ SOLN
INTRAMUSCULAR | Status: AC
  Filled 2023-06-16: qty 2

## 2023-06-16 MED ORDER — ONDANSETRON HCL 4 MG/2ML IJ SOLN
INTRAMUSCULAR | Status: DC | PRN
  Administered 2023-06-16: 4 mg via INTRAVENOUS

## 2023-06-16 MED ORDER — PHENYLEPHRINE 80 MCG/ML (10ML) SYRINGE FOR IV PUSH (FOR BLOOD PRESSURE SUPPORT)
PREFILLED_SYRINGE | INTRAVENOUS | Status: AC
  Filled 2023-06-16: qty 10

## 2023-06-16 MED ORDER — HYDROMORPHONE HCL 1 MG/ML IJ SOLN
INTRAMUSCULAR | Status: DC | PRN
Start: 2023-06-16 — End: 2023-06-16
  Administered 2023-06-16: .5 mg via INTRAVENOUS

## 2023-06-16 MED ORDER — CHLORHEXIDINE GLUCONATE CLOTH 2 % EX PADS: 6.0000 | MEDICATED_PAD | Freq: Once | CUTANEOUS | Status: DC

## 2023-06-16 MED ORDER — MIDAZOLAM HCL 5 MG/5ML IJ SOLN
INTRAMUSCULAR | Status: DC | PRN
  Administered 2023-06-16 (×2): 1 mg via INTRAVENOUS

## 2023-06-16 MED ORDER — SODIUM CHLORIDE 0.9 % IV SOLN: 250.0000 mL | INTRAVENOUS | Status: DC | PRN

## 2023-06-16 MED ORDER — FENTANYL CITRATE (PF) 100 MCG/2ML IJ SOLN
INTRAMUSCULAR | Status: DC | PRN
  Administered 2023-06-16 (×2): 50 ug via INTRAVENOUS

## 2023-06-16 MED ORDER — FENTANYL CITRATE (PF) 100 MCG/2ML IJ SOLN
25.0000 ug | INTRAMUSCULAR | Status: DC | PRN
  Administered 2023-06-16: 25 ug via INTRAVENOUS

## 2023-06-16 MED ORDER — LIDOCAINE-EPINEPHRINE 1 %-1:100000 IJ SOLN
INTRAMUSCULAR | Status: DC | PRN
  Administered 2023-06-16: 10 mL

## 2023-06-16 MED ORDER — OXYCODONE HCL 5 MG PO TABS: 5.0000 mg | ORAL_TABLET | ORAL | Status: DC | PRN

## 2023-06-16 MED ORDER — PROPOFOL 10 MG/ML IV BOLUS
INTRAVENOUS | Status: DC | PRN
  Administered 2023-06-16: 200 mg via INTRAVENOUS

## 2023-06-16 MED ORDER — HYDROMORPHONE HCL 1 MG/ML IJ SOLN
INTRAMUSCULAR | Status: AC
  Filled 2023-06-16: qty 0.5

## 2023-06-16 MED ORDER — LACTATED RINGERS IV SOLN: INTRAVENOUS | Status: DC

## 2023-06-16 MED ORDER — DEXAMETHASONE SODIUM PHOSPHATE 10 MG/ML IJ SOLN
INTRAMUSCULAR | Status: DC | PRN
Start: 2023-06-16 — End: 2023-06-16
  Administered 2023-06-16: 8 mg via INTRAVENOUS

## 2023-06-16 MED ORDER — LIDOCAINE HCL (CARDIAC) PF 100 MG/5ML IV SOSY
PREFILLED_SYRINGE | INTRAVENOUS | Status: DC | PRN
  Administered 2023-06-16: 50 mg via INTRAVENOUS

## 2023-06-16 MED ORDER — DEXAMETHASONE SODIUM PHOSPHATE 10 MG/ML IJ SOLN
INTRAMUSCULAR | Status: AC
  Filled 2023-06-16: qty 1

## 2023-06-16 MED ORDER — EPHEDRINE 5 MG/ML INJ
INTRAVENOUS | Status: AC
  Filled 2023-06-16: qty 5

## 2023-06-16 MED ORDER — HYDROMORPHONE HCL 1 MG/ML IJ SOLN: 0.2500 mg | INTRAMUSCULAR | Status: DC | PRN

## 2023-06-16 SURGICAL SUPPLY — 75 items
ADH SKN CLS APL DERMABOND .7 (GAUZE/BANDAGES/DRESSINGS) ×2
BAG DECANTER FOR FLEXI CONT (MISCELLANEOUS) ×1 IMPLANT
BINDER BREAST LRG (GAUZE/BANDAGES/DRESSINGS) IMPLANT
BINDER BREAST MEDIUM (GAUZE/BANDAGES/DRESSINGS) IMPLANT
BINDER BREAST XLRG (GAUZE/BANDAGES/DRESSINGS) IMPLANT
BINDER BREAST XXLRG (GAUZE/BANDAGES/DRESSINGS) IMPLANT
BIOPATCH RED 1 DISK 7.0 (GAUZE/BANDAGES/DRESSINGS) IMPLANT
BLADE HEX COATED 2.75 (ELECTRODE) ×1 IMPLANT
BLADE SURG 15 STRL LF DISP TIS (BLADE) ×1 IMPLANT
BLADE SURG 15 STRL SS (BLADE) ×1
BNDG GAUZE DERMACEA FLUFF 4 (GAUZE/BANDAGES/DRESSINGS) ×2 IMPLANT
BNDG GZE DERMACEA 4 6PLY (GAUZE/BANDAGES/DRESSINGS)
CANISTER SUCT 1200ML W/VALVE (MISCELLANEOUS) ×1 IMPLANT
COVER BACK TABLE 60X90IN (DRAPES) ×1 IMPLANT
COVER MAYO STAND STRL (DRAPES) ×1 IMPLANT
DERMABOND ADVANCED .7 DNX12 (GAUZE/BANDAGES/DRESSINGS) IMPLANT
DRAIN CHANNEL 19F RND (DRAIN) IMPLANT
DRAPE LAPAROSCOPIC ABDOMINAL (DRAPES) ×1 IMPLANT
DRSG MEPILEX POST OP 4X8 (GAUZE/BANDAGES/DRESSINGS) IMPLANT
ELECT BLADE 4.0 EZ CLEAN MEGAD (MISCELLANEOUS) ×1
ELECT BLADE 6.5 EXT (BLADE) IMPLANT
ELECT REM PT RETURN 9FT ADLT (ELECTROSURGICAL) ×1
ELECTRODE BLDE 4.0 EZ CLN MEGD (MISCELLANEOUS) ×1 IMPLANT
ELECTRODE REM PT RTRN 9FT ADLT (ELECTROSURGICAL) ×1 IMPLANT
EVACUATOR SILICONE 100CC (DRAIN) IMPLANT
FUNNEL KELLER 2 DISP (MISCELLANEOUS) IMPLANT
GAUZE PAD ABD 8X10 STRL (GAUZE/BANDAGES/DRESSINGS) ×2 IMPLANT
GAUZE SPONGE 4X4 12PLY STRL LF (GAUZE/BANDAGES/DRESSINGS) IMPLANT
GLOVE BIO SURGEON STRL SZ 6.5 (GLOVE) ×2 IMPLANT
GOWN STRL REUS W/ TWL LRG LVL3 (GOWN DISPOSABLE) ×3 IMPLANT
GOWN STRL REUS W/TWL LRG LVL3 (GOWN DISPOSABLE) ×3
IMPL BREAST HP 460CC (Breast) IMPLANT
IMPLANT BREAST HP 460CC (Breast) ×2 IMPLANT
IV NS 1000ML (IV SOLUTION) ×2
IV NS 1000ML BAXH (IV SOLUTION) IMPLANT
IV NS 500ML (IV SOLUTION)
IV NS 500ML BAXH (IV SOLUTION) ×1 IMPLANT
KIT FILL ASEPTIC TRANSFER (MISCELLANEOUS) IMPLANT
NDL HYPO 25X1 1.5 SAFETY (NEEDLE) IMPLANT
NDL SAFETY ECLIP 18X1.5 (MISCELLANEOUS) IMPLANT
NDL SPNL 18GX3.5 QUINCKE PK (NEEDLE) IMPLANT
NEEDLE HYPO 25X1 1.5 SAFETY (NEEDLE) ×1 IMPLANT
NEEDLE SPNL 18GX3.5 QUINCKE PK (NEEDLE) IMPLANT
NS IRRIG 1000ML POUR BTL (IV SOLUTION) IMPLANT
PACK BASIN DAY SURGERY FS (CUSTOM PROCEDURE TRAY) ×1 IMPLANT
PENCIL SMOKE EVACUATOR (MISCELLANEOUS) ×1 IMPLANT
PIN SAFETY STERILE (MISCELLANEOUS) IMPLANT
SIZER BREAST SGL 500CC (SIZER) IMPLANT
SIZER BREAST SGL USE 500CC (SIZER) ×1
SLEEVE SCD COMPRESS KNEE MED (STOCKING) ×1 IMPLANT
SPIKE FLUID TRANSFER (MISCELLANEOUS) IMPLANT
SPONGE T-LAP 18X18 ~~LOC~~+RFID (SPONGE) ×2 IMPLANT
STRIP SUTURE WOUND CLOSURE 1/2 (MISCELLANEOUS) IMPLANT
SUT MNCRL AB 4-0 PS2 18 (SUTURE) IMPLANT
SUT MON AB 3-0 SH 27 (SUTURE) ×2
SUT MON AB 3-0 SH27 (SUTURE) ×1 IMPLANT
SUT MON AB 5-0 PS2 18 (SUTURE) ×1 IMPLANT
SUT PDS 3-0 CT2 (SUTURE) ×2
SUT PDS AB 2-0 CT2 27 (SUTURE) IMPLANT
SUT PDS II 3-0 CT2 27 ABS (SUTURE) IMPLANT
SUT PROLENE 3 0 PS 2 (SUTURE) IMPLANT
SUT SILK 3 0 PS 1 (SUTURE) IMPLANT
SUT VIC AB 3-0 SH 27 (SUTURE)
SUT VIC AB 3-0 SH 27X BRD (SUTURE) IMPLANT
SUT VIC AB 4-0 PS2 18 (SUTURE) IMPLANT
SWAB COLLECTION DEVICE MRSA (MISCELLANEOUS) IMPLANT
SWAB CULTURE ESWAB REG 1ML (MISCELLANEOUS) IMPLANT
SYR 50ML LL SCALE MARK (SYRINGE) IMPLANT
SYR BULB IRRIG 60ML STRL (SYRINGE) ×1 IMPLANT
SYR CONTROL 10ML LL (SYRINGE) IMPLANT
TOWEL GREEN STERILE FF (TOWEL DISPOSABLE) ×2 IMPLANT
TRAY DSU PREP LF (CUSTOM PROCEDURE TRAY) ×1 IMPLANT
TUBE CONNECTING 20X1/4 (TUBING) ×1 IMPLANT
UNDERPAD 30X36 HEAVY ABSORB (UNDERPADS AND DIAPERS) ×2 IMPLANT
YANKAUER SUCT BULB TIP NO VENT (SUCTIONS) ×1 IMPLANT

## 2023-06-16 NOTE — Anesthesia Preprocedure Evaluation (Addendum)
Anesthesia Evaluation  Patient identified by MRN, date of birth, ID band Patient awake    Reviewed: Allergy & Precautions, NPO status , Patient's Chart, lab work & pertinent test results  Airway Mallampati: I  TM Distance: >3 FB Neck ROM: Full    Dental  (+) Dental Advisory Given, Chipped,    Pulmonary neg pulmonary ROS   Pulmonary exam normal breath sounds clear to auscultation       Cardiovascular hypertension, Pt. on medications Normal cardiovascular exam Rhythm:Regular Rate:Normal     Neuro/Psych  PSYCHIATRIC DISORDERS Anxiety Depression    negative neurological ROS     GI/Hepatic ,GERD  ,,(+)     substance abuse  cocaine use  Endo/Other  negative endocrine ROS    Renal/GU negative Renal ROS  negative genitourinary   Musculoskeletal negative musculoskeletal ROS (+)    Abdominal   Peds  Hematology negative hematology ROS (+)   Anesthesia Other Findings   Reproductive/Obstetrics                             Anesthesia Physical Anesthesia Plan  ASA: 2  Anesthesia Plan: General   Post-op Pain Management: Tylenol PO (pre-op)*   Induction: Intravenous  PONV Risk Score and Plan: 3 and Ondansetron, Dexamethasone and Midazolam  Airway Management Planned: LMA  Additional Equipment:   Intra-op Plan:   Post-operative Plan: Extubation in OR  Informed Consent: I have reviewed the patients History and Physical, chart, labs and discussed the procedure including the risks, benefits and alternatives for the proposed anesthesia with the patient or authorized representative who has indicated his/her understanding and acceptance.     Dental advisory given  Plan Discussed with: CRNA  Anesthesia Plan Comments:        Anesthesia Quick Evaluation

## 2023-06-16 NOTE — Transfer of Care (Signed)
Immediate Anesthesia Transfer of Care Note  Patient: Penny Kim  Procedure(s) Performed: bilateral removal and replacement of saline implants (Bilateral: Breast) REMOVAL BREAST IMPLANTS (Bilateral: Breast)  Patient Location: PACU  Anesthesia Type:General  Level of Consciousness: drowsy and patient cooperative  Airway & Oxygen Therapy: Patient Spontanous Breathing and Patient connected to face mask oxygen  Post-op Assessment: Report given to RN and Post -op Vital signs reviewed and stable  Post vital signs: Reviewed and stable  Last Vitals:  Vitals Value Taken Time  BP 106/65 06/16/23 1618  Temp 36.1 C 06/16/23 1618  Pulse 74 06/16/23 1629  Resp 14 06/16/23 1629  SpO2 98 % 06/16/23 1629  Vitals shown include unfiled device data.  Last Pain:  Vitals:   06/16/23 1618  TempSrc:   PainSc: Asleep      Patients Stated Pain Goal: 3 (06/16/23 1200)  Complications: No notable events documented.

## 2023-06-16 NOTE — Discharge Instructions (Addendum)
INSTRUCTIONS FOR AFTER BREAST SURGERY   You will likely have some questions about what to expect following your operation.  The following information will help you and your family understand what to expect when you are discharged from the hospital.  It is important to follow these guidelines to help ensure a smooth recovery and reduce complication.  Postoperative instructions include information on: diet, wound care, medications and physical activity.  AFTER SURGERY Expect to go home after the procedure.  In some cases, you may need to spend one night in the hospital for observation.  DIET Breast surgery does not require a specific diet.  However, the healthier you eat the better your body will heal. It is important to increasing your protein intake.  This means limiting the foods with sugar and carbohydrates.  Focus on vegetables and some meat.  If you have liposuction during your procedure be sure to drink water.  If your urine is bright yellow, then it is concentrated, and you need to drink more water.  As a general rule after surgery, you should have 8 ounces of water every hour while awake.  If you find you are persistently nauseated or unable to take in liquids let us know.  NO TOBACCO USE or EXPOSURE.  This will slow your healing process and lead to a wound.  WOUND CARE Leave the binder on for 3 days . Use fragrance free soap like Dial, Dove or [place].   After 3 days you can remove the binder to shower. Once dry apply binder or sports bra. If you have liposuction you will have a soft and spongy dressing (Lipofoam) that helps prevent creases in your skin.  Remove before you shower and then replace it.  It is also available on [employer]. If you have steri-strips / tape directly attached to your skin leave them in place. It is OK to get these wet.   No baths, pools or hot tubs for four weeks. We close your incision to leave the smallest and best-looking scar. No ointment or creams on your incisions  for four weeks.  No Neosporin (Too many skin reactions).  A few weeks after surgery you can use Mederma and start massaging the scar. We ask you to wear your binder or sports bra for the first 6 weeks around the clock, including while sleeping. This provides added comfort and helps reduce the fluid accumulation at the surgery site. NO Ice or heating pads to the operative site.  You have a very high risk of a BURN before you feel the temperature change.  ACTIVITY No heavy lifting until cleared by the doctor.  This usually means no more than a half-gallon of milk.  It is OK to walk and climb stairs. Moving your legs is very important to decrease your risk of a blood clot.  It will also help keep you from getting deconditioned.  Every 1 to 2 hours get up and walk for 5 minutes. This will help with a quicker recovery back to normal.  Let pain be your guide so you don't do too much.  This time is for you to recover.  You will be more comfortable if you sleep and rest with your head elevated either with a few pillows under you or in a recliner.  No stomach sleeping for a three months.  WORK Everyone returns to work at different times. As a rough guide, most people take at least 1 - 2 weeks off prior to returning to work. If  you need documentation for your job, give the forms to the front staff at the clinic.  DRIVING Arrange for someone to bring you home from the hospital after your surgery.  You may be able to drive a few days after surgery but not while taking any narcotics or valium.  BOWEL MOVEMENTS Constipation can occur after anesthesia and while taking pain medication.  It is important to stay ahead for your comfort.  We recommend taking Milk of Magnesia (2 tablespoons; twice a day) while taking the pain pills.  MEDICATIONS You may be prescribed should start after surgery At your preoperative visit for you history and physical you may have been given the following medications: An antibiotic: Start  this medication when you get home and take according to the instructions on the bottle. Zofran 4 mg:  This is to treat nausea and vomiting.  You can take this every 6 hours as needed and only if needed. Valium 2 mg for breast cancer patients: This is for muscle tightness if you have an implant or expander. This will help relax your muscle which also helps with pain control.  This can be taken every 12 hours as needed. Don't drive after taking this medication. Norco (hydrocodone/acetaminophen) 5/325 mg:  This is only to be used after you have taken the Motrin or the Tylenol. Every 8 hours as needed.   Over the counter Medication to take: Ibuprofen (Motrin) 600 mg:  Take this every 6 hours.  If you have additional pain then take 500 mg of the Tylenol every 8 hours.  Only take the Norco after you have tried these two. MiraLAX or Milk of Magnesia: Take this according to the bottle if you take the Norco.  WHEN TO CALL Call your surgeon's office if any of the following occur: Fever 101 degrees F or greater Excessive bleeding or fluid from the incision site. Pain that increases over time without aid from the medications Redness, warmth, or pus draining from incision sites Persistent nausea or inability to take in liquids Severe misshapen area that underwent the operation.   Post Anesthesia Home Care Instructions  Activity: Get plenty of rest for the remainder of the day. A responsible individual must stay with you for 24 hours following the procedure.  For the next 24 hours, DO NOT: -Drive a car -Advertising copywriter -Drink alcoholic beverages -Take any medication unless instructed by your physician -Make any legal decisions or sign important papers.  Meals: Start with liquid foods such as gelatin or soup. Progress to regular foods as tolerated. Avoid greasy, spicy, heavy foods. If nausea and/or vomiting occur, drink only clear liquids until the nausea and/or vomiting subsides. Call your  physician if vomiting continues.  Special Instructions/Symptoms: Your throat may feel dry or sore from the anesthesia or the breathing tube placed in your throat during surgery. If this causes discomfort, gargle with warm salt water. The discomfort should disappear within 24 hours.

## 2023-06-16 NOTE — Interval H&P Note (Signed)
History and Physical Interval Note:  06/16/2023 2:05 PM  Penny Kim  has presented today for surgery, with the diagnosis of hx of breast cancer.  The various methods of treatment have been discussed with the patient and family. After consideration of risks, benefits and other options for treatment, the patient has consented to  Procedure(s): bilateral removal and replacement of saline implants with possible excess skin removal (Bilateral) REMOVAL BREAST IMPLANTS (Bilateral) as a surgical intervention.  The patient's history has been reviewed, patient examined, no change in status, stable for surgery.  I have reviewed the patient's chart and labs.  Questions were answered to the patient's satisfaction.     Penny Kim

## 2023-06-16 NOTE — Op Note (Signed)
Op report Bilateral Exchange   DATE OF OPERATION: 06/16/2023  LOCATION: [facility]  SURGICAL DIVISION: Plastic Surgery  PREOPERATIVE DIAGNOSIS:  1.History of right breast cancer.  2. Acquired absence of bilateral breast.   POSTOPERATIVE DIAGNOSIS:  1. History of right breast cancer.  2. Acquired absence of bilateral breast.   PROCEDURE:  1. Bilateral exchange of implants.  2. Bilateral capsulotomies for implant respositioning.  SURGEON: Foster Simpson, DO  ASSISTANT: Caroline More, PA  ANESTHESIA:  General.   COMPLICATIONS: None.   IMPLANTS: Left - Mentor Smooth Round High Profile Saline 460 cc. Ref #[id number].  Serial Number [id number] filled to 550 cc Right - Mentor Smooth Round High Profile Saline 460 cc. Ref #[id number].  Serial Number [id number] filled to 550 cc  INDICATIONS FOR PROCEDURE:  The patient, Penny Kim, is a 52 y.o. female born on 01/07/1971, is here for treatment after bilateral mastectomies.  She had tissue expanders placed at the time of mastectomies. She now presents for exchange of her expanders for implants.  She requires capsulotomies to better position the implants. MRN: [id number]  CONSENT:  Informed consent was obtained directly from the patient. Risks, benefits and alternatives were fully discussed. Specific risks including but not limited to bleeding, infection, hematoma, seroma, scarring, pain, implant infection, implant extrusion, capsular contracture, asymmetry, wound healing problems, and need for further surgery were all discussed. The patient did have an ample opportunity to have her questions answered to her satisfaction.   DESCRIPTION OF PROCEDURE:  The patient was taken to the operating room. SCDs were placed and IV antibiotics were given. The patient's chest was prepped and draped in a sterile fashion. A time out was performed and the implants to be used were identified.    On the right breast: Local with  epinephrine was used to infiltrate at the incision site. The old mastectomy scar was incised.  The mastectomy flaps from the superior and inferior flaps were raised over the pectoralis major muscle for several centimeters to minimize tension for the closure. The pectoralis was split inferior to the skin incision to expose and remove the implant.  Inspection of the pocket showed a normal healthy capsule.  The pocket was irrigated with vashe. The medial and superior capsule was released to allow for better implant positioning.  Measurements were made and a sizer used to confirm adequate pocket size for the implant dimensions.  Hemostasis was ensured with electrocautery. New gloves were placed. The implant was soaked in Vashe and then placed in the pocket and oriented appropriately. It was filled with saline to 550 cc. The pectoralis major muscle and capsule on the anterior surface were re-closed with a 3-0 PDS. The remaining skin was closed with 3-0 Monocryl deep dermal and 4-0 Monocryl subcuticular stitches.   On the left breast: The old mastectomy scar was incised.  The mastectomy flaps from the superior and inferior flaps were raised over the pectoralis major muscle for several centimeters to minimize tension for the closure. The pectoralis was split inferior to the skin incision to expose and remove the implant.  Inspection of the pocket showed a normal healthy capsule.  Medial and superior capsule were release for better implant positioning. Measurements were made and a sizer utilized to confirm adequate pocket size for the implant dimensions.  Hemostasis was ensured with the electrocautery.  New gloves were applied. The implant was soaked in vashe and placed in the pocket and oriented appropriately. The pectoralis major muscle and capsule on the  anterior surface were re-closed with a 3-0 PDS suture. The remaining skin was closed with 3-0 Monocryl deep dermal and 4-0 Monocryl subcuticular stitches.  Dermabond  was applied to the incision site. A breast binder and ABDs were placed.  The patient was allowed to wake from anesthesia and taken to the recovery room in satisfactory condition. The flaps and skin were very thin.  The advanced practice practitioner (APP) assisted throughout the case.  The APP was essential in retraction and counter traction when needed to make the case progress smoothly.  This retraction and assistance made it possible to see the tissue plans for the procedure.  The assistance was needed for blood control, tissue re-approximation and assisted with closure of the incision site.

## 2023-06-16 NOTE — Anesthesia Procedure Notes (Signed)
Procedure Name: LMA Insertion Date/Time: 06/16/2023 2:45 PM  Performed by: Garth Bigness, CRNAPre-anesthesia Checklist: Patient identified, Emergency Drugs available, Suction available and Patient being monitored Patient Re-evaluated:Patient Re-evaluated prior to induction Oxygen Delivery Method: Circle system utilized Preoxygenation: Pre-oxygenation with 100% oxygen Induction Type: IV induction Ventilation: Mask ventilation without difficulty LMA: LMA inserted LMA Size: 3.0 Tube type: Oral Number of attempts: 1 Placement Confirmation: ETT inserted through vocal cords under direct vision, positive ETCO2 and breath sounds checked- equal and bilateral Tube secured with: Tape Dental Injury: Teeth and Oropharynx as per pre-operative assessment

## 2023-06-17 ENCOUNTER — Encounter (HOSPITAL_BASED_OUTPATIENT_CLINIC_OR_DEPARTMENT_OTHER): Payer: Self-pay | Admitting: Plastic Surgery

## 2023-06-17 NOTE — Anesthesia Postprocedure Evaluation (Signed)
Anesthesia Post Note  Patient: Penny Kim  Procedure(s) Performed: bilateral removal and replacement of saline implants (Bilateral: Breast) REMOVAL BREAST IMPLANTS (Bilateral: Breast)     Patient location during evaluation: PACU Anesthesia Type: General Level of consciousness: awake and alert Pain management: pain level controlled Vital Signs Assessment: post-procedure vital signs reviewed and stable Respiratory status: spontaneous breathing, nonlabored ventilation, respiratory function stable and patient connected to nasal cannula oxygen Cardiovascular status: blood pressure returned to baseline and stable Postop Assessment: no apparent nausea or vomiting Anesthetic complications: no  No notable events documented.  Last Vitals:  Vitals:   06/16/23 1715 06/16/23 1800  BP: 111/68 (!) 141/69  Pulse: 61 71  Resp: 10 14  Temp:  (!) 36.3 C  SpO2: 98% 98%    Last Pain:  Vitals:   06/16/23 1800  TempSrc:   PainSc: 3    Pain Goal: Patients Stated Pain Goal: 3 (06/16/23 1200)                 Penny Kim

## 2023-06-20 ENCOUNTER — Inpatient Hospital Stay: Admission: RE | Admit: 2023-06-20 | Payer: Medicare Other | Source: Ambulatory Visit

## 2023-06-24 ENCOUNTER — Encounter: Payer: Self-pay | Admitting: Plastic Surgery

## 2023-06-24 ENCOUNTER — Ambulatory Visit (INDEPENDENT_AMBULATORY_CARE_PROVIDER_SITE_OTHER): Payer: Medicare Other | Admitting: Plastic Surgery

## 2023-06-24 VITALS — BP 112/72 | HR 71 | Ht 64.5 in | Wt 125.5 lb

## 2023-06-24 DIAGNOSIS — C50211 Malignant neoplasm of upper-inner quadrant of right female breast: Secondary | ICD-10-CM

## 2023-06-24 DIAGNOSIS — Z9889 Other specified postprocedural states: Secondary | ICD-10-CM

## 2023-06-24 MED ORDER — KETOROLAC TROMETHAMINE 10 MG PO TABS: 10.0000 mg | ORAL_TABLET | Freq: Four times a day (QID) | ORAL | 0 refills | Status: DC | PRN

## 2023-06-24 NOTE — Progress Notes (Signed)
The patient is a 52 year old female here for follow-up after undergoing removal of implants and replacement.  She has 550 cc saline implants in place.  She looks much better than preop.  She is a little bit sore but no ongoing issues.  The Norco made her a little bit woozy with headache.  I will send in some Toradol for her.  Pictures were obtained of the patient and placed in the chart with the patient's or guardian's permission.  She is interested in nipple areola tattooing and we will get her set up with one of the PAs.  This will likely be 2 to 3 months down the road.

## 2023-06-25 ENCOUNTER — Encounter: Payer: Self-pay | Admitting: Plastic Surgery

## 2023-06-30 ENCOUNTER — Encounter: Payer: Self-pay | Admitting: Plastic Surgery

## 2023-07-05 ENCOUNTER — Encounter: Payer: Medicare Other | Admitting: Physician Assistant

## 2023-07-05 ENCOUNTER — Encounter: Payer: Medicare Other | Admitting: Student

## 2023-07-05 NOTE — Progress Notes (Deleted)
Patient is a pleasant 52 year old female s/p bilateral implant removal and replacement with saline implants performed 06/16/2023 by Dr. Ulice Bold who presents to clinic for postoperative follow-up.  Reviewed operative report and 460 cc saline implants were placed at time of surgery, filled to 550 cc saline.  The skin and flaps were noted to be particularly thin at time of surgery.  Today,

## 2023-07-11 ENCOUNTER — Encounter: Payer: Self-pay | Admitting: Dermatology

## 2023-07-11 ENCOUNTER — Encounter: Payer: Medicare Other | Admitting: Student

## 2023-07-11 ENCOUNTER — Other Ambulatory Visit: Payer: Self-pay | Admitting: Physician Assistant

## 2023-07-11 DIAGNOSIS — Z79818 Long term (current) use of other agents affecting estrogen receptors and estrogen levels: Secondary | ICD-10-CM

## 2023-07-11 DIAGNOSIS — Z170 Estrogen receptor positive status [ER+]: Secondary | ICD-10-CM

## 2023-07-11 NOTE — Progress Notes (Deleted)
Patient is a pleasant 52 year old female s/p bilateral implant removal and replacement with saline implants performed 06/16/2023 by Dr. Ulice Bold who presents to clinic for postoperative follow-up.  Reviewed operative report and 460 cc saline implants were placed at time of surgery, filled to 550 cc saline.  The skin and flaps were noted to be particularly thin at time of surgery.  Today,

## 2023-07-12 ENCOUNTER — Encounter: Payer: Medicare Other | Admitting: Student

## 2023-07-15 ENCOUNTER — Ambulatory Visit (INDEPENDENT_AMBULATORY_CARE_PROVIDER_SITE_OTHER): Payer: Medicare Other | Admitting: Student

## 2023-07-15 DIAGNOSIS — Z170 Estrogen receptor positive status [ER+]: Secondary | ICD-10-CM

## 2023-07-15 DIAGNOSIS — Z9889 Other specified postprocedural states: Secondary | ICD-10-CM

## 2023-07-15 NOTE — Progress Notes (Signed)
Patient is a pleasant 52 year old female s/p bilateral implant removal and replacement with saline implants performed 06/16/2023 by Dr. Ulice Bold who presents to clinic for postoperative follow-up.  Patient is 4 weeks postop.  Reviewed operative report and 460 cc saline implants were placed at time of surgery, filled to 550 cc saline.  The skin and flaps were noted to be particularly thin at time of surgery.  Patient was last seen in the clinic on 06/24/2023.  At this visit, patient reported she was doing well postoperatively.  She states she was a little bit sore but no longer any issues.  Patient stated that the Norco made her a little bit woozy.  Today, patient reports she is doing well.  She states that she still is taking the Toradol from time to time for pain.  Otherwise denies any other issues or concerns.  Chaperone present on exam.  On exam, patient is sitting upright in no acute distress.  Breast implants are in place bilaterally, and are soft.  There is no overlying erythema.  No fluid collections palpated on exam.  Incisions are both intact and healing well.  There were some suture knots noted to the left breast.  These were cut and removed.  Patient tolerated well.  There does appear to be a suture starting to protrude from the skin to the right breast incision.  I was unable to get it at this time.  There is no surrounding erythema.  No open wound on exam.  No signs of infection on exam.  Discussed with patient would like her to apply Vaseline once daily to her incisions.  Discussed with her that I would like her to wear compression at all times.  Patient expressed understanding.  Encouraged patient to try and wean off of any sort of medications for pain.  Encouraged her to take Tylenol or Toradol as needed for pain.  Discussed with her that she can transition to ibuprofen when she is out of Toradol.  Discussed with her that she should never take Toradol and ibuprofen at the same time.   Patient expressed understanding.  Discussed with patient that she should monitor her right breast incision closely.  Discussed with her that if the area where the suture is trying to come through becomes more irritated or it starts forming a wound, she needs to let us know immediately as we do not want the implant to be exposed.  Patient expressed understanding.  Patient to follow back up in 1 week or a little over 1 week.  Instructed her to call in the meantime she has any questions or concerns.  Pictures were obtained of the patient and placed in the chart with the patient's or guardian's permission.

## 2023-07-25 ENCOUNTER — Encounter: Payer: Medicare Other | Admitting: Student

## 2023-07-27 ENCOUNTER — Encounter: Payer: Medicare Other | Admitting: Student

## 2023-08-01 ENCOUNTER — Encounter: Payer: Medicare Other | Admitting: Student

## 2023-08-01 ENCOUNTER — Encounter: Payer: Self-pay | Admitting: *Deleted

## 2023-08-03 ENCOUNTER — Ambulatory Visit (INDEPENDENT_AMBULATORY_CARE_PROVIDER_SITE_OTHER): Payer: Medicare Other | Admitting: Student

## 2023-08-03 ENCOUNTER — Encounter: Payer: Self-pay | Admitting: Student

## 2023-08-03 VITALS — BP 118/77 | HR 76 | Ht 64.5 in | Wt 129.2 lb

## 2023-08-03 DIAGNOSIS — Z9889 Other specified postprocedural states: Secondary | ICD-10-CM

## 2023-08-03 DIAGNOSIS — C50211 Malignant neoplasm of upper-inner quadrant of right female breast: Secondary | ICD-10-CM

## 2023-08-03 NOTE — Progress Notes (Signed)
Patient is a pleasant 52 year old female s/p bilateral implant removal and replacement with saline implants performed 06/16/2023 by Dr. Ulice Bold who presents to clinic for postoperative follow-up.  Patient is 7 weeks postop.     Patient was last seen in the clinic on 07/15/2023.  At this visit, patient was doing well.  On exam, breast implants were in place bilaterally and were soft.  Incisions were both intact and healing well.  There were some suture knots noted to the left breast.  There was a suture that started to protrude from the skin to the right breast incision.  Plan is for patient to apply Vaseline once daily to her incisions.  Today, patient reports she is doing well.  She has no new complaints or concerns.  She denies any drainage from her incisions.  She states she is a little bit bothered by some excess tissue out laterally to her left axilla.  Chaperone present on exam.  On exam, patient is sitting upright in no acute distress.  Breasts are soft and fairly symmetric.  Implants in place.  No overlying fluid collections.  Incisions appears to be clean dry and intact.  No wounds noted on exam.  No erythema.  No signs of infection.  Little bit of excess tissue noted out laterally in the axilla on the left side.  Discussed with patient that she may start scar creams if she would like to her incisions bilaterally.  Discussed with her that she does not have to wear compression anymore may transition to a regular bra without underwire.  Discussed with her she may start gradually increasing her activities.  Patient expressed understanding.  Discussed with her that I like her to gently massage her left lateral breast.  We will have her follow-up with Dr. Ulice Bold in 1 month regarding this excess tissue.  Patient expressed understanding and was in agreement.  Patient otherwise to follow-up in 1 year.  Instructed patient to call in the meantime she is any questions or concerns about  anything.  Pictures were obtained of the patient and placed in the chart with the patient's or guardian's permission.

## 2023-08-22 ENCOUNTER — Telehealth: Payer: Self-pay

## 2023-08-22 ENCOUNTER — Other Ambulatory Visit: Payer: Self-pay | Admitting: Gastroenterology

## 2023-08-22 ENCOUNTER — Other Ambulatory Visit: Payer: Self-pay

## 2023-08-22 DIAGNOSIS — Z1211 Encounter for screening for malignant neoplasm of colon: Secondary | ICD-10-CM

## 2023-08-22 MED ORDER — NA SULFATE-K SULFATE-MG SULF 17.5-3.13-1.6 GM/177ML PO SOLN: 1.0000 | Freq: Once | ORAL | 0 refills | Status: AC

## 2023-08-22 NOTE — Telephone Encounter (Signed)
 PT REQUESTING CALL BACK TO SCHEDULE COLONOSCOPY

## 2023-08-22 NOTE — Telephone Encounter (Signed)
Gastroenterology Pre-Procedure Review  Request Date: 10/07/23 Requesting Physician: Dr. Allegra Lai  PATIENT REVIEW QUESTIONS: The patient responded to the following health history questions as indicated:    1. Are you having any GI issues? no 2. Do you have a personal history of Polyps? no 3. Do you have a family history of Colon Cancer or Polyps? no 4. Diabetes Mellitus? no 5. Joint replacements in the past 12 months? Breast implant replaced earlier this year 6. Major health problems in the past 3 months?no 7. Any artificial heart valves, MVP, or defibrillator?no    MEDICATIONS & ALLERGIES:    Patient reports the following regarding taking any anticoagulation/antiplatelet therapy:   Plavix, Coumadin, Eliquis, Xarelto, Lovenox, Pradaxa, Brilinta, or Effient? no Aspirin? no  Patient confirms/reports the following medications:  Current Outpatient Medications  Medication Sig Dispense Refill   acyclovir (ZOVIRAX) 400 MG tablet Take 400 mg by mouth 3 (three) times daily.     amitriptyline (ELAVIL) 50 MG tablet Take 50 mg by mouth at bedtime.     amLODipine (NORVASC) 10 MG tablet Take 1 tablet (10 mg total) by mouth daily. Needs to keep appt for further refills 90 tablet 3   clonazePAM (KLONOPIN) 1 MG tablet Take 1 mg by mouth 3 (three) times daily as needed.     erythromycin ophthalmic ointment SMARTSIG:In Eye(s)     Fluocinolone Acetonide Body 0.01 % OIL Apply to scalp once or twice a day as needed 120 mL 5   Fluocinolone Acetonide Scalp 0.01 % OIL Apply 1-2 times daily to scalp as needed. 118 mL 2   gabapentin (NEURONTIN) 300 MG capsule Take 300 mg by mouth 4 (four) times daily as needed (For pain).     hydrOXYzine (ATARAX) 25 MG tablet Take 1 tablet by mouth 3 (three) times daily as needed.     TRINTELLIX 10 MG TABS tablet Take 10 mg by mouth daily.     azelastine (OPTIVAR) 0.05 % ophthalmic solution Apply to eye. (Patient not taking: Reported on 08/22/2023)     ciprofloxacin (CIPRO) 500 MG  tablet  (Patient not taking: Reported on 08/22/2023)     Flaxseed, Linseed, (FLAX SEED OIL) 1000 MG CAPS Take by mouth. (Patient not taking: Reported on 08/22/2023)     ketorolac (TORADOL) 10 MG tablet Take 1 tablet (10 mg total) by mouth every 6 (six) hours as needed. (Patient not taking: Reported on 08/22/2023) 20 tablet 0   mupirocin ointment (BACTROBAN) 2 % Apply 1 Application topically 3 (three) times daily. Apply to affected area. (Patient not taking: Reported on 08/22/2023)     No current facility-administered medications for this visit.    Patient confirms/reports the following allergies:  Allergies  Allergen Reactions   Prochlorperazine Other (See Comments)    Syncope   Propranolol Rash   Tape Rash   Wound Dressing Adhesive Rash    No orders of the defined types were placed in this encounter.   AUTHORIZATION INFORMATION Primary Insurance: 1D#: Group #:  Secondary Insurance: 1D#: Group #:  SCHEDULE INFORMATION: Date: 10/07/23 Time: Location: [place]

## 2023-08-23 ENCOUNTER — Ambulatory Visit: Payer: Medicare Other | Admitting: Dermatology

## 2023-09-19 ENCOUNTER — Ambulatory Visit: Payer: Medicare Other | Admitting: Dermatology

## 2023-10-07 ENCOUNTER — Ambulatory Visit
Admission: RE | Admit: 2023-10-07 | Discharge: 2023-10-07 | Disposition: A | Discharge status: Home / Self Care | Payer: Medicare Other | Attending: Gastroenterology | Admitting: Gastroenterology

## 2023-10-07 ENCOUNTER — Encounter: Payer: Self-pay | Admitting: Gastroenterology

## 2023-10-07 ENCOUNTER — Other Ambulatory Visit: Payer: Self-pay

## 2023-10-07 ENCOUNTER — Encounter
Admission: RE | Disposition: A | Discharge status: Home / Self Care | Payer: Self-pay | Source: Home / Self Care | Attending: Gastroenterology

## 2023-10-07 ENCOUNTER — Ambulatory Visit: Payer: Medicare Other | Admitting: Certified Registered"

## 2023-10-07 DIAGNOSIS — Z1211 Encounter for screening for malignant neoplasm of colon: Secondary | ICD-10-CM | POA: Diagnosis present

## 2023-10-07 DIAGNOSIS — I10 Essential (primary) hypertension: Secondary | ICD-10-CM | POA: Insufficient documentation

## 2023-10-07 DIAGNOSIS — F419 Anxiety disorder, unspecified: Secondary | ICD-10-CM | POA: Insufficient documentation

## 2023-10-07 DIAGNOSIS — D125 Benign neoplasm of sigmoid colon: Secondary | ICD-10-CM | POA: Diagnosis not present

## 2023-10-07 DIAGNOSIS — K219 Gastro-esophageal reflux disease without esophagitis: Secondary | ICD-10-CM | POA: Diagnosis not present

## 2023-10-07 HISTORY — PX: COLONOSCOPY WITH PROPOFOL: SHX5780

## 2023-10-07 HISTORY — PX: POLYPECTOMY: SHX5525

## 2023-10-07 SURGERY — COLONOSCOPY WITH PROPOFOL
Anesthesia: General

## 2023-10-07 MED ORDER — SODIUM CHLORIDE 0.9 % IV SOLN: INTRAVENOUS | Status: DC | PRN

## 2023-10-07 MED ORDER — PROPOFOL 10 MG/ML IV BOLUS
INTRAVENOUS | Status: DC | PRN
  Administered 2023-10-07 (×3): 20 mg via INTRAVENOUS
  Administered 2023-10-07: 40 mg via INTRAVENOUS
  Administered 2023-10-07 (×4): 20 mg via INTRAVENOUS

## 2023-10-07 MED ORDER — SODIUM CHLORIDE 0.9 % IV SOLN
INTRAVENOUS | Status: DC
Start: 2023-10-07 — End: 2023-10-07

## 2023-10-07 MED ORDER — LIDOCAINE HCL (PF) 1 % IJ SOLN
INTRAMUSCULAR | Status: DC | PRN
  Administered 2023-10-07: 40 mg

## 2023-10-07 NOTE — Anesthesia Preprocedure Evaluation (Signed)
Anesthesia Evaluation  Patient identified by MRN, date of birth, ID band Patient awake    Reviewed: Allergy & Precautions, NPO status , Patient's Chart, lab work & pertinent test results  Airway Mallampati: II  TM Distance: >3 FB Neck ROM: full    Dental  (+) Teeth Intact   Pulmonary neg pulmonary ROS   Pulmonary exam normal breath sounds clear to auscultation       Cardiovascular Exercise Tolerance: Good hypertension, Pt. on medications negative cardio ROS Normal cardiovascular exam Rhythm:Regular Rate:Normal     Neuro/Psych   Anxiety     negative neurological ROS  negative psych ROS   GI/Hepatic negative GI ROS, Neg liver ROS,GERD  Medicated,,  Endo/Other  negative endocrine ROS    Renal/GU negative Renal ROS  negative genitourinary   Musculoskeletal   Abdominal   Peds negative pediatric ROS (+)  Hematology negative hematology ROS (+)   Anesthesia Other Findings Past Medical History: No date: Allergy 01/03/2022: AMS (altered mental status) 01/03/2022: Anemia No date: Anxiety 02/2010: Breast cancer, right breast (HCC)     Comment:  s/p chemo (completed 09/2010) and right mastectomy               w/reconstruction 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 TTE 10/2016: EF 50-55%, no RWMA, normal LV diastolic               function, left atrium normal, RV systolic function               normal, PASP normal No date: History of stress test     Comment:  a. treadmill Myoview 11/2016: small defect of mild               severity present in the apex location felt to be               secondary to breast attenuation. EF 55-65%. Normal study No date: HTN  (hypertension) No date: Orthostatic hypotension No date: Psoriasis  Past Surgical History: 06/16/2023: BREAST IMPLANT REMOVAL; Bilateral     Comment:  Procedure: REMOVAL BREAST IMPLANTS;  Surgeon:               Peggye Form, DO;  Location: [facility];  Service: Plastics;  Laterality: Bilateral; 01/06/2012: BREAST RECONSTRUCTION     Comment:  Procedure: BREAST RECONSTRUCTION;  Surgeon: Etter Sjogren, MD;  Location: [facility];                Service: Plastics;  Laterality: Bilateral;  bilateral               removal of tissue expanders, bilateral placement of               implants--Breast 06/19/2010: BREAST SURGERY     Comment:  exploration of right mastectomy site due to post-op               bleeding 06/19/2010: INSERTION OF TISSUE EXPANDER AFTER MASTECTOMY     Comment:  bilat. mastectomies with bilat. tissue expanders 06/16/2023: PLACEMENT OF BREAST IMPLANTS; Bilateral  Comment:  Procedure: bilateral removal and replacement of saline               implants;  Surgeon: Peggye Form, DO;  Location:               [facility];  Service: Plastics;                Laterality: Bilateral; 11/26/2010: PORT-A-CATH REMOVAL 07/23/2010: PORTACATH PLACEMENT 10/20/2010: TISSUE EXPANDER REMOVAL     Comment:  left  BMI    Body Mass Index: 22.98 kg/m      Reproductive/Obstetrics negative OB ROS                             Anesthesia Physical Anesthesia Plan  ASA: 2  Anesthesia Plan: General   Post-op Pain Management:    Induction: Intravenous  PONV Risk Score and Plan: Propofol infusion and TIVA  Airway Management Planned: Natural Airway and Nasal Cannula  Additional Equipment:   Intra-op Plan:   Post-operative Plan:   Informed Consent: I have reviewed the patients History and Physical, chart, labs and discussed the procedure including the risks, benefits and alternatives  for the proposed anesthesia with the patient or authorized representative who has indicated his/her understanding and acceptance.     Dental Advisory Given  Plan Discussed with: CRNA  Anesthesia Plan Comments:        Anesthesia Quick Evaluation

## 2023-10-07 NOTE — Anesthesia Postprocedure Evaluation (Signed)
Anesthesia Post Note  Patient: Penny Kim  Procedure(s) Performed: COLONOSCOPY WITH PROPOFOL POLYPECTOMY  Patient location during evaluation: PACU Anesthesia Type: General Level of consciousness: awake and awake and alert Pain management: pain level controlled Vital Signs Assessment: post-procedure vital signs reviewed and stable Respiratory status: spontaneous breathing Cardiovascular status: blood pressure returned to baseline Anesthetic complications: no   No notable events documented.   Last Vitals:  Vitals:   10/07/23 0814 10/07/23 0854  BP: (!) 144/83 (!) 117/59  Pulse: 87 79  Resp:  14  Temp: (!) 36.2 C   SpO2: 100% 100%    Last Pain:  Vitals:   10/07/23 0854  TempSrc:   PainSc: 0-No pain                 VAN STAVEREN,Jabari Swoveland

## 2023-10-07 NOTE — Transfer of Care (Signed)
Immediate Anesthesia Transfer of Care Note  Patient: Penny Kim  Procedure(s) Performed: COLONOSCOPY WITH PROPOFOL POLYPECTOMY  Patient Location: Endoscopy Unit  Anesthesia Type:MAC  Level of Consciousness: drowsy  Airway & Oxygen Therapy: Patient Spontanous Breathing and Patient connected to nasal cannula oxygen  Post-op Assessment: Report given to RN and Post -op Vital signs reviewed and stable  Post vital signs: Reviewed and stable  Last Vitals:  Vitals Value Taken Time  BP 114.79   Temp    Pulse 85   Resp 16   SpO2 96     Last Pain:  Vitals:   10/07/23 0854  TempSrc:   PainSc: 0-No pain         Complications: No notable events documented.

## 2023-10-07 NOTE — H&P (Signed)
[name], MD [address]  [address]  [place], [place] [address]  Main: [phone]  Fax: [phone] Pager: [phone]  Primary Care Physician:  Leanna Sato, MD Primary Gastroenterologist:  Dr. [name]  Pre-Procedure History & Physical: HPI:  Penny Kim is a 53 y.o. female is here for an colonoscopy.   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 TTE 10/2016: EF 50-55%, no RWMA, normal LV diastolic function, left atrium normal, RV systolic function normal, PASP normal   History of stress test    a. treadmill Myoview 11/2016: small defect of mild severity present in the apex location felt to be secondary to breast attenuation. EF 55-65%. Normal study   HTN (hypertension)    Orthostatic hypotension    Psoriasis     Past Surgical History:  Procedure Laterality Date   BREAST IMPLANT REMOVAL Bilateral 06/16/2023   Procedure: REMOVAL BREAST IMPLANTS;  Surgeon: Peggye Form, DO;  Location: [facility];  Service: Plastics;  Laterality: Bilateral;   BREAST RECONSTRUCTION  01/06/2012   Procedure: BREAST RECONSTRUCTION;  Surgeon: Etter Sjogren, MD;  Location: [facility];  Service: Plastics;  Laterality: Bilateral;  bilateral removal of tissue expanders, bilateral placement of implants--Breast   BREAST SURGERY  06/19/2010   exploration of right mastectomy site due to post-op bleeding   INSERTION OF TISSUE EXPANDER AFTER MASTECTOMY  06/19/2010   bilat. mastectomies with bilat. tissue  expanders   PLACEMENT OF BREAST IMPLANTS Bilateral 06/16/2023   Procedure: bilateral removal and replacement of saline implants;  Surgeon: Peggye Form, DO;  Location: [facility];  Service: Plastics;  Laterality: Bilateral;   PORT-A-CATH REMOVAL  11/26/2010   PORTACATH PLACEMENT  07/23/2010   TISSUE EXPANDER REMOVAL  10/20/2010   left    Prior to Admission medications   Medication Sig Start Date End Date Taking? Authorizing Provider  acyclovir (ZOVIRAX) 400 MG tablet Take 400 mg by mouth 3 (three) times daily. 07/22/22   [provider]  amitriptyline (ELAVIL) 50 MG tablet Take 50 mg by mouth at bedtime. 08/10/23   [provider]  amLODipine (NORVASC) 10 MG tablet Take 1 tablet (10 mg total) by mouth daily. Needs to keep appt for further refills 03/11/23   End, Cristal Deer, MD  azelastine (OPTIVAR) 0.05 % ophthalmic solution Apply to eye. Patient not taking: Reported on 08/22/2023 05/09/23   [provider]  ciprofloxacin (CIPRO) 500 MG tablet     [provider]  clonazePAM (KLONOPIN) 1 MG tablet Take 1 mg by mouth 3 (three) times daily as needed. 08/02/23   [provider]  erythromycin ophthalmic ointment SMARTSIG:In Eye(s) 05/09/23   [provider]  Flaxseed, Linseed, (FLAX SEED OIL) 1000 MG CAPS Take by mouth. Patient not taking: Reported on 08/22/2023 04/22/15   [provider]  Fluocinolone Acetonide Body 0.01 % OIL Apply to scalp once or twice a day as needed 06/15/23   Elie Goody, MD  Fluocinolone Acetonide Scalp 0.01 % OIL Apply 1-2 times daily to scalp as needed. 06/16/23   Elie Goody, MD  gabapentin (NEURONTIN) 300 MG capsule Take 300 mg by mouth 4 (four) times daily as needed (For pain). 01/31/21   [provider]  hydrOXYzine (ATARAX) 25 MG tablet Take 1 tablet by mouth 3 (three) times daily as needed.    [provider]  ketorolac (TORADOL) 10 MG tablet Take 1 tablet (10  mg total) by mouth every 6 (six) hours as needed. Patient not taking: Reported on 08/22/2023 06/24/23   Dillingham, Alena Bills, DO  mupirocin ointment (BACTROBAN) 2 % Apply 1 Application topically 3 (three) times daily. Apply to affected area. Patient not taking: Reported on 08/22/2023 08/31/22   [provider]  TRINTELLIX 10 MG TABS tablet Take 10 mg by mouth daily. 12/15/21   [provider]    Allergies as of 08/22/2023 - Review Complete 08/22/2023  Allergen Reaction Noted   Prochlorperazine Other (See Comments) 08/24/2011   Propranolol Rash 01/07/2020   Tape Rash 08/24/2011   Wound dressing adhesive Rash 08/24/2011    Family History  Problem Relation Age of Onset   Hypertension Mother    Depression Mother    Heart attack Mother    Hyperlipidemia Mother    Cancer Sister 49       Ovarian cancer   Hypertension Maternal Grandmother    Diabetes Maternal Grandmother    Cancer Maternal Grandfather        Lung cancer - Education officer, environmental and smoker   Hypertension Maternal Grandfather    Diabetes Paternal Grandmother    Hypertension Paternal Grandmother    Kidney disease Paternal Grandmother        End stage renal dz - dialysis   Hyperlipidemia Paternal Grandmother    Breast cancer Paternal Grandmother    Alcohol abuse Father    Depression Father    Drug abuse Father 48       Died of drug overdose    Social History   Socioeconomic History   Marital status: Single    Spouse name: Not on file   Number of children: Not on file   Years of education: 18   Highest education level: Not on file  Occupational History   Occupation: Publishing rights manager Relations    Comment: [employer]  Tobacco Use   Smoking status: Never   Smokeless tobacco: Never  Vaping Use   Vaping status: Never Used  Substance and Sexual Activity   Alcohol use: Not Currently    Comment: occasionally   Drug use: Not Currently    Types: Cocaine    Comment: last use  08/2022   Sexual activity: Not Currently    Birth control/protection: None  Other Topics Concern   Not on file  Social History Narrative   Keasha [place] in [place], [place]. She attended [employer] where she obtained her Bachelors of Science degree in Psychology. 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 She currently lives in [place]. She lives alone. Oneida enjoys attending live musical performances. 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    Social Drivers of Corporate investment banker Strain: Not on file  Food Insecurity: Not on file  Transportation Needs: Not on file  Physical Activity: Not on file  Stress: Not on file  Social Connections: Unknown (01/29/2022)   Received from [facility]   Social Network    Social Network: Not on file  Intimate Partner Violence: Unknown (12/21/2021)   [facility]   HITS    Physically Hurt: Not on file    Insult or Talk Down To: Not on file    Threaten Physical Harm: Not on file    Scream or Curse: Not on file    Review of Systems: See HPI, otherwise negative ROS  Physical Exam: BP (!) 144/83   Pulse 87   Temp (!) 97.2 F (36.2 C) (Temporal)   Wt 61.7 kg   LMP 09/21/2010   SpO2 100%   BMI 22.98 kg/m  General:   Alert,  pleasant and cooperative in NAD Head:  Normocephalic and atraumatic. Neck:  Supple; no masses or thyromegaly. Lungs:  Clear throughout to auscultation.    Heart:  Regular rate and rhythm. Abdomen:  Soft, nontender and nondistended. Normal bowel sounds, without guarding, and without rebound.   Neurologic:  Alert and  oriented x4;  grossly normal neurologically.  Impression/Plan: KRYSTIAN PEADEN is here for an colonoscopy to be performed for colon cancer screening  Risks, benefits, limitations, and alternatives regarding  colonoscopy have been reviewed with the patient.   Questions have been answered.  All parties agreeable.   Lannette Donath, MD  10/07/2023, 8:30 AM

## 2023-10-07 NOTE — Op Note (Signed)
[facility]astroenter[facility] Patient Name: Penny Kim Procedure Date: 10/07/2023 8:29 AM MRN: [id number] Account #: [id number] Date of Birth: 1971-07-25 Admit Type: Outpatient Age: 53 Room: [facility] ENDO ROOM 2 Gender: Female Note Status: Finalized Instrument Name: Peds Colonoscope [id number] Procedure:             Colonoscopy Indications:           Screening for colorectal malignant neoplasm, This is                         the patient's first colonoscopy Providers:             Toney Reil MD, MD Medicines:             General Anesthesia Complications:         No immediate complications. Estimated blood loss: None. Procedure:             Pre-Anesthesia Assessment:                        - Prior to the procedure, a History and Physical was                         performed, and patient medications and allergies were                         reviewed. The patient is competent. The risks and                         benefits of the procedure and the sedation options and                         risks were discussed with the patient. All questions                         were answered and informed consent was obtained.                         Patient identification and proposed procedure were                         verified by the physician, the nurse, the                         anesthesiologist, the anesthetist and the technician                         in the pre-procedure area in the procedure room in the                         endoscopy suite. Mental Status Examination: alert and                         oriented. Airway Examination: normal oropharyngeal                         airway and neck mobility. Respiratory Examination:                         clear to  auscultation. CV Examination: normal.                         Prophylactic Antibiotics: The patient does not require                         prophylactic antibiotics. Prior Anticoagulants: The                          patient has taken no anticoagulant or antiplatelet                         agents. ASA Grade Assessment: II - A patient with mild                         systemic disease. After reviewing the risks and                         benefits, the patient was deemed in satisfactory                         condition to undergo the procedure. The anesthesia                         plan was to use general anesthesia. Immediately prior                         to administration of medications, the patient was                         re-assessed for adequacy to receive sedatives. The                         heart rate, respiratory rate, oxygen saturations,                         blood pressure, adequacy of pulmonary ventilation, and                         response to care were monitored throughout the                         procedure. The physical status of the patient was                         re-assessed after the procedure.                        After obtaining informed consent, the colonoscope was                         passed under direct vision. Throughout the procedure,                         the patient's blood pressure, pulse, and oxygen                         saturations were monitored continuously. The  Colonoscope was introduced through the anus and                         advanced to the the cecum, identified by appendiceal                         orifice and ileocecal valve. The colonoscopy was                         performed without difficulty. The patient tolerated                         the procedure well. The quality of the bowel                         preparation was evaluated using the BBPS [facility] Bowel                         Preparation Scale) with scores of: Right Colon = 3,                         Transverse Colon = 3 and Left Colon = 3 (entire mucosa                         seen well with no residual staining, small fragments                          of stool or opaque liquid). The total BBPS score                         equals 9. The ileocecal valve, appendiceal orifice,                         and rectum were photographed. Findings:      The perianal and digital rectal examinations were normal. Pertinent       negatives include normal sphincter tone and no palpable rectal lesions.      A 4 mm polyp was found in the sigmoid colon. The polyp was sessile. The       polyp was removed with a cold snare. Resection and retrieval were       complete. Estimated blood loss: none.      The retroflexed view of the distal rectum and anal verge was normal and       showed no anal or rectal abnormalities. Impression:            - One 4 mm polyp in the sigmoid colon, removed with a                         cold snare. Resected and retrieved.                        - The distal rectum and anal verge are normal on                         retroflexion view. Recommendation:        - Discharge patient to home (with escort).                        -  Resume previous diet today.                        - Continue present medications.                        - Await pathology results.                        - Repeat colonoscopy in 7-10 years for surveillance                         based on pathology results. Procedure Code(s):     --- Professional ---                        [phone], Colonoscopy, flexible; with removal of                         tumor(s), polyp(s), or other lesion(s) by snare                         technique Diagnosis Code(s):     --- Professional ---                        Z12.11, Encounter for screening for malignant neoplasm                         of colon                        D12.5, Benign neoplasm of sigmoid colon CPT copyright 2022 American Medical Association. All rights reserved. The codes documented in this report are preliminary and upon coder review may  be revised to meet current compliance requirements. Dr.  Libby Maw Toney Reil MD, MD 10/07/2023 8:52:50 AM This report has been signed electronically. Number of Addenda: 0 Note Initiated On: 10/07/2023 8:29 AM Scope Withdrawal Time: 0 hours 14 minutes 44 seconds  Total Procedure Duration: 0 hours 18 minutes 20 seconds  Estimated Blood Loss:  Estimated blood loss: none.      [facility]

## 2023-10-10 ENCOUNTER — Encounter: Payer: Self-pay | Admitting: Gastroenterology

## 2023-10-10 LAB — SURGICAL PATHOLOGY

## 2023-10-18 ENCOUNTER — Ambulatory Visit (INDEPENDENT_AMBULATORY_CARE_PROVIDER_SITE_OTHER): Payer: Medicare Other | Admitting: Plastic Surgery

## 2023-10-18 DIAGNOSIS — N651 Disproportion of reconstructed breast: Secondary | ICD-10-CM | POA: Insufficient documentation

## 2023-10-18 DIAGNOSIS — T8544XA Capsular contracture of breast implant, initial encounter: Secondary | ICD-10-CM | POA: Insufficient documentation

## 2023-10-18 NOTE — Progress Notes (Signed)
   Subjective:    Patient ID: Penny Kim, female    DOB: June 19, 1971, 53 y.o.   MRN: [id number]  The patient is a 53 year old female here for follow-up after undergoing breast reconstruction.  She had had reconstruction 10 years ago with Dr. 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  She had 650 saline implants that were filled to 750 cc bilaterally.  She complained of discomfort and large breasts.  She was not a smoker.  She did have a little bit of rippling.  The implants were large and wide for her size.  On September 2024 the patient went to surgery we removed the implants and placed a Mentor smooth round high-profile saline 460 cc implants and filled them to 550.  This looks much better and she had capsule work done that helped with the positioning.  Now she is got some reoccurring scarring on the left lateral breast that is distorting the breast.      Review of Systems  Constitutional: Negative.   HENT: Negative.    Eyes: Negative.   Respiratory: Negative.    Cardiovascular: Negative.   Gastrointestinal: Negative.   Endocrine: Negative.   Musculoskeletal: Negative.        Objective:   Physical Exam Vitals reviewed.  Constitutional:      Appearance: Normal appearance.  HENT:     Head: Atraumatic.  Cardiovascular:     Rate and Rhythm: Normal rate.     Pulses: Normal pulses.  Pulmonary:     Effort: Pulmonary effort is normal.  Abdominal:     Palpations: Abdomen is soft.  Skin:    Capillary Refill: Capillary refill takes less than 2 seconds.  Neurological:     Mental Status: She is alert and oriented to person, place, and time.  Psychiatric:        Mood and Affect: Mood normal.        Behavior: Behavior normal.        Thought Content: Thought content normal.        Judgment: Judgment normal.        Assessment & Plan:     ICD-10-CM   1. Breast asymmetry following reconstructive surgery  N65.1     2. Capsular contracture of left  breast implant  T85.44XA        Plan for release of scar contracture with tissue rearrangement and excision of scar tissue left lateral breast.  She may need fat grafting so this is a possibility as well.  Pictures were obtained of the patient and placed in the chart with the patient's or guardian's permission.

## 2023-11-09 ENCOUNTER — Encounter: Payer: Self-pay | Admitting: Dermatology

## 2023-11-09 ENCOUNTER — Encounter: Payer: Medicare Other | Admitting: Student

## 2023-11-14 ENCOUNTER — Encounter: Payer: Medicare Other | Admitting: Student

## 2023-11-15 ENCOUNTER — Encounter (HOSPITAL_BASED_OUTPATIENT_CLINIC_OR_DEPARTMENT_OTHER): Payer: Self-pay | Admitting: Plastic Surgery

## 2023-11-15 ENCOUNTER — Other Ambulatory Visit: Payer: Self-pay

## 2023-11-21 ENCOUNTER — Ambulatory Visit (INDEPENDENT_AMBULATORY_CARE_PROVIDER_SITE_OTHER): Payer: Medicare Other | Admitting: Student

## 2023-11-21 VITALS — BP 126/84 | HR 91

## 2023-11-21 DIAGNOSIS — N651 Disproportion of reconstructed breast: Secondary | ICD-10-CM

## 2023-11-21 MED ORDER — ONDANSETRON HCL 4 MG PO TABS: 4.0000 mg | ORAL_TABLET | Freq: Three times a day (TID) | ORAL | 0 refills | Status: DC | PRN

## 2023-11-21 MED ORDER — CEPHALEXIN 500 MG PO CAPS: 500.0000 mg | ORAL_CAPSULE | Freq: Four times a day (QID) | ORAL | 0 refills | Status: DC

## 2023-11-21 MED ORDER — OXYCODONE HCL 5 MG PO TABS: 5.0000 mg | ORAL_TABLET | Freq: Four times a day (QID) | ORAL | 0 refills | Status: DC | PRN

## 2023-11-21 NOTE — H&P (View-Only) (Signed)
 Patient ID: Penny Kim, female    DOB: 25-Jul-1971, 53 y.o.   MRN: [id number]  Chief Complaint  Patient presents with   Pre-op Exam      ICD-10-CM   1. Breast asymmetry following reconstructive surgery  N65.1        History of Present Illness: Penny Kim is a 53 y.o.  female  with a history of breast cancer status post breast reconstruction.  She presents for preoperative evaluation for upcoming procedure, left breast capsule excision with possible grafting and tissue rearrangement, scheduled for 11/23/2023 with Dr. Ulice Bold.  The patient has not had problems with anesthesia.  She reports that her only cardiac issue is hypertension.  Cardiac clearance was received before patient's surgery in September.  She denies any issues since her surgery in September.  Patient denies taking any blood thinners.  Patient reports she is not a smoker.  Patient denies taking any birth control or hormone replacement.  She denies any history of miscarriages.  Patient denied any personal history of blood clots.  She thinks that her grandmother or her aunt may have had a blood clot.  She denies any recent traumas, surgeries or infections.  She denies any history of stroke or heart attack.  She denies any history of Crohn's disease or ulcerative colitis, COPD or asthma.  She does have history of cancer.  Patient denies any varicosities to her lower extremities.  She denies any recent fevers, chills or changes in her health.  Summary of Previous Visit: Patient was last seen by Dr. Ulice Bold on 10/18/2023.  At this visit, patient presented with a reoccurring scarring on the left lateral breast that was distorting the breast.  Plan was to move forward with her release of scar contracture and tissue rearrangement of excision of scar tissue of the left lateral breast.  It was noted that she may need fat grafting as well.  Job: Does not work at this time  PMH Significant for: Hypertension, breast cancer,  anxiety, depression   Past Medical History: Allergies: Allergies  Allergen Reactions   Prochlorperazine Other (See Comments)    Syncope   Propranolol Rash   Tape Rash   Wound Dressing Adhesive Rash    Current Medications:  Current Outpatient Medications:    acyclovir (ZOVIRAX) 400 MG tablet, Take 400 mg by mouth 3 (three) times daily., Disp: , Rfl:    amitriptyline (ELAVIL) 50 MG tablet, Take 50 mg by mouth at bedtime., Disp: , Rfl:    amLODipine (NORVASC) 10 MG tablet, Take 1 tablet (10 mg total) by mouth daily. Needs to keep appt for further refills, Disp: 90 tablet, Rfl: 3   clonazePAM (KLONOPIN) 1 MG tablet, Take 1 mg by mouth 3 (three) times daily as needed., Disp: , Rfl:    Fluocinolone Acetonide Scalp 0.01 % OIL, Apply 1-2 times daily to scalp as needed., Disp: 118 mL, Rfl: 2   gabapentin (NEURONTIN) 300 MG capsule, Take 300 mg by mouth 4 (four) times daily as needed (For pain)., Disp: , Rfl:    hydrOXYzine (ATARAX) 25 MG tablet, Take 1 tablet by mouth 3 (three) times daily as needed., Disp: , Rfl:    TRINTELLIX 10 MG TABS tablet, Take 10 mg by mouth daily., Disp: , Rfl:   Past Medical Problems: Past Medical History:  Diagnosis Date   Allergy    AMS (altered mental status) 01/03/2022   Anemia 01/03/2022   Anxiety    Breast cancer, right breast (HCC)  02/2010   s/p chemo (completed 09/2010) and right mastectomy w/reconstruction   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 TTE 10/2016: EF 50-55%, no RWMA, normal LV diastolic function, left atrium normal, RV systolic function normal, PASP normal   History of stress test    a. treadmill Myoview 11/2016: small defect of mild severity present in the apex location felt to be secondary to breast attenuation. EF  55-65%. Normal study   HTN (hypertension)    Orthostatic hypotension    Psoriasis     Past Surgical History: Past Surgical History:  Procedure Laterality Date   BREAST IMPLANT REMOVAL Bilateral 06/16/2023   Procedure: REMOVAL BREAST IMPLANTS;  Surgeon: Peggye Form, DO;  Location: [facility];  Service: Plastics;  Laterality: Bilateral;   BREAST RECONSTRUCTION  01/06/2012   Procedure: BREAST RECONSTRUCTION;  Surgeon: Etter Sjogren, MD;  Location: [facility];  Service: Plastics;  Laterality: Bilateral;  bilateral removal of tissue expanders, bilateral placement of implants--Breast   BREAST SURGERY  06/19/2010   exploration of right mastectomy site due to post-op bleeding   COLONOSCOPY WITH PROPOFOL N/A 10/07/2023   Procedure: COLONOSCOPY WITH PROPOFOL;  Surgeon: Toney Reil, MD;  Location: [facility];  Service: Gastroenterology;  Laterality: N/A;   INSERTION OF TISSUE EXPANDER AFTER MASTECTOMY  06/19/2010   bilat. mastectomies with bilat. tissue expanders   PLACEMENT OF BREAST IMPLANTS Bilateral 06/16/2023   Procedure: bilateral removal and replacement of saline implants;  Surgeon: Peggye Form, DO;  Location: [facility];  Service: Plastics;  Laterality: Bilateral;   POLYPECTOMY  10/07/2023   Procedure: POLYPECTOMY;  Surgeon: Toney Reil, MD;  Location: [facility];  Service: Gastroenterology;;   [facility] REMOVAL  11/26/2010   PORTACATH PLACEMENT  07/23/2010   TISSUE EXPANDER REMOVAL  10/20/2010   left    Social History: Social History   Socioeconomic History   Marital status: Single    Spouse name: Not on file   Number of children: Not on file   Years of education: 18   Highest education level: Not on file  Occupational History   Occupation: Publishing rights manager Relations    Comment: Psychologist, forensic  Tobacco Use   Smoking status: Never   Smokeless tobacco: Never  Vaping Use    Vaping status: Never Used  Substance and Sexual Activity   Alcohol use: Not Currently    Comment: occasionally   Drug use: Not Currently    Types: Cocaine    Comment: last use 08/2022   Sexual activity: Not Currently    Birth control/protection: None, Post-menopausal    Comment: chemo 2011  Other Topics Concern   Not on file  Social History Narrative   Breanne [place], [place]. She attended [employer] where she obtained her Bachelors of Science degree in Psychology. 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 She currently lives in [place]. She lives alone. Shala enjoys attending live musical performances. 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    Social Drivers of Corporate investment banker Strain: Not on file  Food Insecurity: Not on file  Transportation Needs: Not  on file  Physical Activity: Not on file  Stress: Not on file  Social Connections: Unknown (01/29/2022)   Received from [facility]   Social Network    Social Network: Not on file  Intimate Partner Violence: Unknown (12/21/2021)   Received from [facility], [facility]   HITS    Physically Hurt: Not on file    Insult or Talk Down To: Not on file    Threaten Physical Harm: Not on file    Scream or Curse: Not on file    Family History: Family History  Problem Relation Age of Onset   Hypertension Mother    Depression Mother    Heart attack Mother    Hyperlipidemia Mother    Cancer Sister 58       Ovarian cancer   Hypertension Maternal Grandmother    Diabetes Maternal Grandmother    Cancer Maternal Grandfather        Lung cancer - Education officer, environmental and smoker   Hypertension Maternal Grandfather    Diabetes Paternal Grandmother    Hypertension Paternal Grandmother    Kidney disease Paternal Grandmother        End stage renal dz - dialysis   Hyperlipidemia Paternal Grandmother    Breast  cancer Paternal Grandmother    Alcohol abuse Father    Depression Father    Drug abuse Father 58       Died of drug overdose    Review of Systems: Denies any recent fevers or chills or changes in her health  Physical Exam: Vital Signs BP 126/84 (BP Location: Left Arm, Patient Position: Sitting, Cuff Size: Normal)   Pulse 91   LMP 09/21/2010   SpO2 100%   Physical Exam  Constitutional:      General: Not in acute distress.    Appearance: Normal appearance. Not ill-appearing.  HENT:     Head: Normocephalic and atraumatic.  Neck:     Musculoskeletal: Normal range of motion.  Cardiovascular:     Rate and Rhythm: Normal rate Pulmonary:     Effort: Pulmonary effort is normal. No respiratory distress.  Musculoskeletal: Normal range of motion.  Skin:    General: Skin is warm and dry.     Findings: No erythema or rash.  Neurological:     Mental Status: Alert and oriented to person, place, and time. Mental status is at baseline.  Psychiatric:        Mood and Affect: Mood normal.        Behavior: Behavior normal.    Assessment/Plan: The patient is scheduled for left breast capsule excision with possible fat grafting and tissue rearrangement with Dr. Ulice Bold.  Risks, benefits, and alternatives of procedure discussed, questions answered and consent obtained.    Smoking Status: Non-smoker; Counseling Given?  N/A Last Mammogram: N/A due to bilateral mastectomies  Caprini Score: 8; Risk Factors include: Age, family history of DVT, history of malignancy, and length of planned surgery. Recommendation for mechanical and possible pharmacological prophylaxis. Encourage early ambulation.   Pictures obtained: @consult  10/18/2023  Post-op Rx sent to pharmacy:  Patient denied any allergies to penicillin and also reported she did well with oxycodone .  Will send Keflex, Zofran and oxycodone.  Discussed with patient that these are to take for after surgery.  Patient expressed  understanding.  Instructed patient to hold her amitriptyline, gabapentin and clonazepam the morning of surgery.  Patient expressed understanding.  Patient was provided with the General Surgical Risk consent document and Pain  Medication Agreement prior to their appointment.  They had adequate time to read through the risk consent documents and Pain Medication Agreement. We also discussed them in person together during this preop appointment. All of their questions were answered to their satisfaction.  Recommended calling if they have any further questions.  Risk consent form and Pain Medication Agreement to be scanned into patient's chart.  The consent was obtained with risks and complications reviewed which included bleeding, pain, scar, infection and the risk of anesthesia.  The patients questions were answered to the patients expressed satisfaction.    Electronically signed by: Laurena Spies, PA-C 11/21/2023 2:32 PM

## 2023-11-21 NOTE — Progress Notes (Signed)
 Patient ID: Penny Kim, female    DOB: 25-Jul-1971, 53 y.o.   MRN: [id number]  Chief Complaint  Patient presents with   Pre-op Exam      ICD-10-CM   1. Breast asymmetry following reconstructive surgery  N65.1        History of Present Illness: Penny Kim is a 53 y.o.  female  with a history of breast cancer status post breast reconstruction.  She presents for preoperative evaluation for upcoming procedure, left breast capsule excision with possible grafting and tissue rearrangement, scheduled for 11/23/2023 with Dr. Ulice Bold.  The patient has not had problems with anesthesia.  She reports that her only cardiac issue is hypertension.  Cardiac clearance was received before patient's surgery in September.  She denies any issues since her surgery in September.  Patient denies taking any blood thinners.  Patient reports she is not a smoker.  Patient denies taking any birth control or hormone replacement.  She denies any history of miscarriages.  Patient denied any personal history of blood clots.  She thinks that her grandmother or her aunt may have had a blood clot.  She denies any recent traumas, surgeries or infections.  She denies any history of stroke or heart attack.  She denies any history of Crohn's disease or ulcerative colitis, COPD or asthma.  She does have history of cancer.  Patient denies any varicosities to her lower extremities.  She denies any recent fevers, chills or changes in her health.  Summary of Previous Visit: Patient was last seen by Dr. Ulice Bold on 10/18/2023.  At this visit, patient presented with a reoccurring scarring on the left lateral breast that was distorting the breast.  Plan was to move forward with her release of scar contracture and tissue rearrangement of excision of scar tissue of the left lateral breast.  It was noted that she may need fat grafting as well.  Job: Does not work at this time  PMH Significant for: Hypertension, breast cancer,  anxiety, depression   Past Medical History: Allergies: Allergies  Allergen Reactions   Prochlorperazine Other (See Comments)    Syncope   Propranolol Rash   Tape Rash   Wound Dressing Adhesive Rash    Current Medications:  Current Outpatient Medications:    acyclovir (ZOVIRAX) 400 MG tablet, Take 400 mg by mouth 3 (three) times daily., Disp: , Rfl:    amitriptyline (ELAVIL) 50 MG tablet, Take 50 mg by mouth at bedtime., Disp: , Rfl:    amLODipine (NORVASC) 10 MG tablet, Take 1 tablet (10 mg total) by mouth daily. Needs to keep appt for further refills, Disp: 90 tablet, Rfl: 3   clonazePAM (KLONOPIN) 1 MG tablet, Take 1 mg by mouth 3 (three) times daily as needed., Disp: , Rfl:    Fluocinolone Acetonide Scalp 0.01 % OIL, Apply 1-2 times daily to scalp as needed., Disp: 118 mL, Rfl: 2   gabapentin (NEURONTIN) 300 MG capsule, Take 300 mg by mouth 4 (four) times daily as needed (For pain)., Disp: , Rfl:    hydrOXYzine (ATARAX) 25 MG tablet, Take 1 tablet by mouth 3 (three) times daily as needed., Disp: , Rfl:    TRINTELLIX 10 MG TABS tablet, Take 10 mg by mouth daily., Disp: , Rfl:   Past Medical Problems: Past Medical History:  Diagnosis Date   Allergy    AMS (altered mental status) 01/03/2022   Anemia 01/03/2022   Anxiety    Breast cancer, right breast (HCC)  02/2010   s/p chemo (completed 09/2010) and right mastectomy w/reconstruction   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 TTE 10/2016: EF 50-55%, no RWMA, normal LV diastolic function, left atrium normal, RV systolic function normal, PASP normal   History of stress test    a. treadmill Myoview 11/2016: small defect of mild severity present in the apex location felt to be secondary to breast attenuation. EF  55-65%. Normal study   HTN (hypertension)    Orthostatic hypotension    Psoriasis     Past Surgical History: Past Surgical History:  Procedure Laterality Date   BREAST IMPLANT REMOVAL Bilateral 06/16/2023   Procedure: REMOVAL BREAST IMPLANTS;  Surgeon: Peggye Form, DO;  Location: [facility];  Service: Plastics;  Laterality: Bilateral;   BREAST RECONSTRUCTION  01/06/2012   Procedure: BREAST RECONSTRUCTION;  Surgeon: Etter Sjogren, MD;  Location: [facility];  Service: Plastics;  Laterality: Bilateral;  bilateral removal of tissue expanders, bilateral placement of implants--Breast   BREAST SURGERY  06/19/2010   exploration of right mastectomy site due to post-op bleeding   COLONOSCOPY WITH PROPOFOL N/A 10/07/2023   Procedure: COLONOSCOPY WITH PROPOFOL;  Surgeon: Toney Reil, MD;  Location: [facility];  Service: Gastroenterology;  Laterality: N/A;   INSERTION OF TISSUE EXPANDER AFTER MASTECTOMY  06/19/2010   bilat. mastectomies with bilat. tissue expanders   PLACEMENT OF BREAST IMPLANTS Bilateral 06/16/2023   Procedure: bilateral removal and replacement of saline implants;  Surgeon: Peggye Form, DO;  Location: [facility];  Service: Plastics;  Laterality: Bilateral;   POLYPECTOMY  10/07/2023   Procedure: POLYPECTOMY;  Surgeon: Toney Reil, MD;  Location: [facility];  Service: Gastroenterology;;   [facility] REMOVAL  11/26/2010   PORTACATH PLACEMENT  07/23/2010   TISSUE EXPANDER REMOVAL  10/20/2010   left    Social History: Social History   Socioeconomic History   Marital status: Single    Spouse name: Not on file   Number of children: Not on file   Years of education: 18   Highest education level: Not on file  Occupational History   Occupation: Publishing rights manager Relations    Comment: Psychologist, forensic  Tobacco Use   Smoking status: Never   Smokeless tobacco: Never  Vaping Use    Vaping status: Never Used  Substance and Sexual Activity   Alcohol use: Not Currently    Comment: occasionally   Drug use: Not Currently    Types: Cocaine    Comment: last use 08/2022   Sexual activity: Not Currently    Birth control/protection: None, Post-menopausal    Comment: chemo 2011  Other Topics Concern   Not on file  Social History Narrative   Penny Kim [place] up in [place], [place]. She attended [employer] where she obtained her Bachelors of Science degree in Psychology. 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 She currently lives in [place]. She lives alone. Shala enjoys attending live musical performances. 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    Social Drivers of Corporate investment banker Strain: Not on file  Food Insecurity: Not on file  Transportation Needs: Not  on file  Physical Activity: Not on file  Stress: Not on file  Social Connections: Unknown (01/29/2022)   Received from [facility]   Social Network    Social Network: Not on file  Intimate Partner Violence: Unknown (12/21/2021)   Received from [facility], [facility]   HITS    Physically Hurt: Not on file    Insult or Talk Down To: Not on file    Threaten Physical Harm: Not on file    Scream or Curse: Not on file    Family History: Family History  Problem Relation Age of Onset   Hypertension Mother    Depression Mother    Heart attack Mother    Hyperlipidemia Mother    Cancer Sister 58       Ovarian cancer   Hypertension Maternal Grandmother    Diabetes Maternal Grandmother    Cancer Maternal Grandfather        Lung cancer - Education officer, environmental and smoker   Hypertension Maternal Grandfather    Diabetes Paternal Grandmother    Hypertension Paternal Grandmother    Kidney disease Paternal Grandmother        End stage renal dz - dialysis   Hyperlipidemia Paternal Grandmother    Breast  cancer Paternal Grandmother    Alcohol abuse Father    Depression Father    Drug abuse Father 58       Died of drug overdose    Review of Systems: Denies any recent fevers or chills or changes in her health  Physical Exam: Vital Signs BP 126/84 (BP Location: Left Arm, Patient Position: Sitting, Cuff Size: Normal)   Pulse 91   LMP 09/21/2010   SpO2 100%   Physical Exam  Constitutional:      General: Not in acute distress.    Appearance: Normal appearance. Not ill-appearing.  HENT:     Head: Normocephalic and atraumatic.  Neck:     Musculoskeletal: Normal range of motion.  Cardiovascular:     Rate and Rhythm: Normal rate Pulmonary:     Effort: Pulmonary effort is normal. No respiratory distress.  Musculoskeletal: Normal range of motion.  Skin:    General: Skin is warm and dry.     Findings: No erythema or rash.  Neurological:     Mental Status: Alert and oriented to person, place, and time. Mental status is at baseline.  Psychiatric:        Mood and Affect: Mood normal.        Behavior: Behavior normal.    Assessment/Plan: The patient is scheduled for left breast capsule excision with possible fat grafting and tissue rearrangement with Dr. Ulice Bold.  Risks, benefits, and alternatives of procedure discussed, questions answered and consent obtained.    Smoking Status: Non-smoker; Counseling Given?  N/A Last Mammogram: N/A due to bilateral mastectomies  Caprini Score: 8; Risk Factors include: Age, family history of DVT, history of malignancy, and length of planned surgery. Recommendation for mechanical and possible pharmacological prophylaxis. Encourage early ambulation.   Pictures obtained: @consult  10/18/2023  Post-op Rx sent to pharmacy:  Patient denied any allergies to penicillin and also reported she did well with oxycodone .  Will send Keflex, Zofran and oxycodone.  Discussed with patient that these are to take for after surgery.  Patient expressed  understanding.  Instructed patient to hold her amitriptyline, gabapentin and clonazepam the morning of surgery.  Patient expressed understanding.  Patient was provided with the General Surgical Risk consent document and Pain  Medication Agreement prior to their appointment.  They had adequate time to read through the risk consent documents and Pain Medication Agreement. We also discussed them in person together during this preop appointment. All of their questions were answered to their satisfaction.  Recommended calling if they have any further questions.  Risk consent form and Pain Medication Agreement to be scanned into patient's chart.  The consent was obtained with risks and complications reviewed which included bleeding, pain, scar, infection and the risk of anesthesia.  The patients questions were answered to the patients expressed satisfaction.    Electronically signed by: Laurena Spies, PA-C 11/21/2023 2:32 PM

## 2023-11-22 ENCOUNTER — Other Ambulatory Visit: Payer: Self-pay | Admitting: Student

## 2023-11-22 ENCOUNTER — Telehealth: Payer: Self-pay

## 2023-11-22 NOTE — Telephone Encounter (Signed)
 I called the [facility] and cancelled the prescription for the medications. I am unable to find a [facility] to send them to. I attempted to call the patient to clarify where she would like the prescriptions sent, but she did not answer. LVM to return call. Will send in updated prescription once she clarifies which pharmacy she would like them sent to.

## 2023-11-22 NOTE — Telephone Encounter (Signed)
 Patient called and stated she does not use [facility] instead wanting to know if the prescriptions that was sent yesterday faxed to [facility], fax#: [phone].

## 2023-11-23 ENCOUNTER — Other Ambulatory Visit: Payer: Self-pay

## 2023-11-23 ENCOUNTER — Ambulatory Visit (HOSPITAL_BASED_OUTPATIENT_CLINIC_OR_DEPARTMENT_OTHER)
Admission: RE | Admit: 2023-11-23 | Discharge: 2023-11-23 | Disposition: A | Discharge status: Home / Self Care | Payer: Medicare Other | Attending: Plastic Surgery | Admitting: Plastic Surgery

## 2023-11-23 ENCOUNTER — Encounter (HOSPITAL_BASED_OUTPATIENT_CLINIC_OR_DEPARTMENT_OTHER): Payer: Self-pay | Admitting: Plastic Surgery

## 2023-11-23 ENCOUNTER — Ambulatory Visit (HOSPITAL_BASED_OUTPATIENT_CLINIC_OR_DEPARTMENT_OTHER): Admitting: Anesthesiology

## 2023-11-23 ENCOUNTER — Encounter (HOSPITAL_BASED_OUTPATIENT_CLINIC_OR_DEPARTMENT_OTHER)
Admission: RE | Disposition: A | Discharge status: Home / Self Care | Payer: Self-pay | Source: Home / Self Care | Attending: Plastic Surgery

## 2023-11-23 DIAGNOSIS — Z853 Personal history of malignant neoplasm of breast: Secondary | ICD-10-CM | POA: Diagnosis not present

## 2023-11-23 DIAGNOSIS — K219 Gastro-esophageal reflux disease without esophagitis: Secondary | ICD-10-CM | POA: Insufficient documentation

## 2023-11-23 DIAGNOSIS — I10 Essential (primary) hypertension: Secondary | ICD-10-CM | POA: Insufficient documentation

## 2023-11-23 DIAGNOSIS — Z01818 Encounter for other preprocedural examination: Secondary | ICD-10-CM

## 2023-11-23 DIAGNOSIS — Y838 Other surgical procedures as the cause of abnormal reaction of the patient, or of later complication, without mention of misadventure at the time of the procedure: Secondary | ICD-10-CM | POA: Diagnosis not present

## 2023-11-23 DIAGNOSIS — T8544XA Capsular contracture of breast implant, initial encounter: Secondary | ICD-10-CM | POA: Insufficient documentation

## 2023-11-23 DIAGNOSIS — N651 Disproportion of reconstructed breast: Secondary | ICD-10-CM | POA: Diagnosis present

## 2023-11-23 DIAGNOSIS — Z9221 Personal history of antineoplastic chemotherapy: Secondary | ICD-10-CM | POA: Insufficient documentation

## 2023-11-23 HISTORY — PX: LIPOSUCTION WITH LIPOFILLING: SHX6436

## 2023-11-23 HISTORY — PX: CAPSULECTOMY: SHX5381

## 2023-11-23 SURGERY — CAPSULECTOMY, BREAST
Anesthesia: General | Site: Breast | Laterality: Left

## 2023-11-23 MED ORDER — LIDOCAINE 2% (20 MG/ML) 5 ML SYRINGE
INTRAMUSCULAR | Status: AC
  Filled 2023-11-23: qty 5

## 2023-11-23 MED ORDER — MIDAZOLAM HCL 2 MG/2ML IJ SOLN
INTRAMUSCULAR | Status: AC
Start: 2023-11-23 — End: ?
  Filled 2023-11-23: qty 2

## 2023-11-23 MED ORDER — PROPOFOL 10 MG/ML IV BOLUS
INTRAVENOUS | Status: AC
Start: 2023-11-23 — End: ?
  Filled 2023-11-23: qty 20

## 2023-11-23 MED ORDER — CEPHALEXIN 500 MG PO CAPS: 500.0000 mg | ORAL_CAPSULE | Freq: Four times a day (QID) | ORAL | 0 refills | Status: AC

## 2023-11-23 MED ORDER — LACTATED RINGERS IV SOLN: INTRAVENOUS | Status: DC

## 2023-11-23 MED ORDER — BUPIVACAINE HCL (PF) 0.25 % IJ SOLN
INTRAMUSCULAR | Status: AC
  Filled 2023-11-23: qty 30

## 2023-11-23 MED ORDER — LIDOCAINE-EPINEPHRINE 1 %-1:100000 IJ SOLN
INTRAMUSCULAR | Status: DC | PRN
  Administered 2023-11-23: 8 mL

## 2023-11-23 MED ORDER — SODIUM CHLORIDE 0.9% FLUSH: 3.0000 mL | Freq: Two times a day (BID) | INTRAVENOUS | Status: DC

## 2023-11-23 MED ORDER — BUPIVACAINE LIPOSOME 1.3 % IJ SUSP
INTRAMUSCULAR | Status: AC
  Filled 2023-11-23: qty 10

## 2023-11-23 MED ORDER — SODIUM CHLORIDE 0.9% FLUSH: 3.0000 mL | INTRAVENOUS | Status: DC | PRN

## 2023-11-23 MED ORDER — OXYCODONE HCL 5 MG PO TABS
5.0000 mg | ORAL_TABLET | Freq: Once | ORAL | Status: AC | PRN
  Administered 2023-11-23: 5 mg via ORAL

## 2023-11-23 MED ORDER — MIDAZOLAM HCL 5 MG/5ML IJ SOLN
INTRAMUSCULAR | Status: DC | PRN
  Administered 2023-11-23: 2 mg via INTRAVENOUS

## 2023-11-23 MED ORDER — PHENYLEPHRINE 80 MCG/ML (10ML) SYRINGE FOR IV PUSH (FOR BLOOD PRESSURE SUPPORT)
PREFILLED_SYRINGE | INTRAVENOUS | Status: AC
  Filled 2023-11-23: qty 10

## 2023-11-23 MED ORDER — BUPIVACAINE-EPINEPHRINE (PF) 0.25% -1:200000 IJ SOLN
INTRAMUSCULAR | Status: AC
  Filled 2023-11-23: qty 30

## 2023-11-23 MED ORDER — PROPOFOL 10 MG/ML IV BOLUS
INTRAVENOUS | Status: DC | PRN
  Administered 2023-11-23: 200 mg via INTRAVENOUS

## 2023-11-23 MED ORDER — SODIUM CHLORIDE (PF) 0.9 % IJ SOLN
INTRAMUSCULAR | Status: AC
  Filled 2023-11-23: qty 10

## 2023-11-23 MED ORDER — FENTANYL CITRATE (PF) 100 MCG/2ML IJ SOLN: 25.0000 ug | INTRAMUSCULAR | Status: DC | PRN

## 2023-11-23 MED ORDER — ACETAMINOPHEN 325 MG RE SUPP: 650.0000 mg | RECTAL | Status: DC | PRN

## 2023-11-23 MED ORDER — ONDANSETRON HCL 4 MG PO TABS: 4.0000 mg | ORAL_TABLET | Freq: Three times a day (TID) | ORAL | 0 refills | Status: DC | PRN

## 2023-11-23 MED ORDER — LACTATED RINGERS IV SOLN
INTRAVENOUS | Status: AC | PRN
  Administered 2023-11-23 (×2): 1000 mL

## 2023-11-23 MED ORDER — ACETAMINOPHEN 325 MG PO TABS: 650.0000 mg | ORAL_TABLET | ORAL | Status: DC | PRN

## 2023-11-23 MED ORDER — FENTANYL CITRATE (PF) 100 MCG/2ML IJ SOLN
INTRAMUSCULAR | Status: DC | PRN
  Administered 2023-11-23 (×2): 25 ug via INTRAVENOUS
  Administered 2023-11-23: 50 ug via INTRAVENOUS

## 2023-11-23 MED ORDER — BUPIVACAINE LIPOSOME 1.3 % IJ SUSP
INTRAMUSCULAR | Status: DC | PRN
  Administered 2023-11-23: 10 mL

## 2023-11-23 MED ORDER — LIDOCAINE HCL (CARDIAC) PF 100 MG/5ML IV SOSY
PREFILLED_SYRINGE | INTRAVENOUS | Status: DC | PRN
  Administered 2023-11-23: 60 mg via INTRAVENOUS

## 2023-11-23 MED ORDER — CEFAZOLIN SODIUM-DEXTROSE 2-4 GM/100ML-% IV SOLN
2.0000 g | INTRAVENOUS | Status: AC
  Administered 2023-11-23: 2 g via INTRAVENOUS

## 2023-11-23 MED ORDER — CHLORHEXIDINE GLUCONATE CLOTH 2 % EX PADS: 6.0000 | MEDICATED_PAD | Freq: Once | CUTANEOUS | Status: DC

## 2023-11-23 MED ORDER — DEXAMETHASONE SODIUM PHOSPHATE 10 MG/ML IJ SOLN
INTRAMUSCULAR | Status: DC | PRN
Start: 2023-11-23 — End: 2023-11-23
  Administered 2023-11-23: 10 mg via INTRAVENOUS

## 2023-11-23 MED ORDER — BACITRACIN ZINC 500 UNIT/GM EX OINT
TOPICAL_OINTMENT | CUTANEOUS | Status: AC
  Filled 2023-11-23: qty 28.35

## 2023-11-23 MED ORDER — OXYCODONE HCL 5 MG PO TABS: 5.0000 mg | ORAL_TABLET | ORAL | Status: DC | PRN

## 2023-11-23 MED ORDER — ONDANSETRON HCL 4 MG/2ML IJ SOLN
INTRAMUSCULAR | Status: DC | PRN
  Administered 2023-11-23: 4 mg via INTRAVENOUS

## 2023-11-23 MED ORDER — SUCCINYLCHOLINE CHLORIDE 200 MG/10ML IV SOSY
PREFILLED_SYRINGE | INTRAVENOUS | Status: DC | PRN
  Administered 2023-11-23: 100 mg via INTRAVENOUS

## 2023-11-23 MED ORDER — LIDOCAINE-EPINEPHRINE 1 %-1:100000 IJ SOLN
INTRAMUSCULAR | Status: AC
  Filled 2023-11-23: qty 1

## 2023-11-23 MED ORDER — PHENYLEPHRINE HCL (PRESSORS) 10 MG/ML IV SOLN
INTRAVENOUS | Status: DC | PRN
  Administered 2023-11-23: 80 ug via INTRAVENOUS
  Administered 2023-11-23 (×2): 160 ug via INTRAVENOUS

## 2023-11-23 MED ORDER — ACETAMINOPHEN 500 MG PO TABS
1000.0000 mg | ORAL_TABLET | Freq: Once | ORAL | Status: AC
  Administered 2023-11-23: 1000 mg via ORAL

## 2023-11-23 MED ORDER — LIDOCAINE HCL (PF) 1 % IJ SOLN
INTRAMUSCULAR | Status: AC
  Filled 2023-11-23: qty 30

## 2023-11-23 MED ORDER — ONDANSETRON HCL 4 MG/2ML IJ SOLN
INTRAMUSCULAR | Status: AC
  Filled 2023-11-23: qty 2

## 2023-11-23 MED ORDER — EPINEPHRINE PF 1 MG/ML IJ SOLN
INTRAMUSCULAR | Status: AC
  Filled 2023-11-23: qty 1

## 2023-11-23 MED ORDER — LIDOCAINE HCL 1 % IJ SOLN
INTRAVENOUS | Status: DC | PRN
  Administered 2023-11-23: 1000 mL

## 2023-11-23 MED ORDER — PROPOFOL 500 MG/50ML IV EMUL
INTRAVENOUS | Status: AC
  Filled 2023-11-23: qty 200

## 2023-11-23 MED ORDER — SODIUM CHLORIDE 0.9 % IV SOLN: 250.0000 mL | INTRAVENOUS | Status: DC | PRN

## 2023-11-23 MED ORDER — ACETAMINOPHEN 500 MG PO TABS
ORAL_TABLET | ORAL | Status: AC
  Filled 2023-11-23: qty 2

## 2023-11-23 MED ORDER — DEXAMETHASONE SODIUM PHOSPHATE 10 MG/ML IJ SOLN
INTRAMUSCULAR | Status: AC
  Filled 2023-11-23: qty 1

## 2023-11-23 MED ORDER — OXYCODONE HCL 5 MG PO CAPS: 5.0000 mg | ORAL_CAPSULE | Freq: Four times a day (QID) | ORAL | 0 refills | Status: AC | PRN

## 2023-11-23 MED ORDER — OXYCODONE HCL 5 MG/5ML PO SOLN: 5.0000 mg | Freq: Once | ORAL | Status: AC | PRN

## 2023-11-23 MED ORDER — POVIDONE-IODINE 5 % OP SOLN
OPHTHALMIC | Status: AC
  Filled 2023-11-23: qty 30

## 2023-11-23 MED ORDER — CELECOXIB 200 MG PO CAPS
200.0000 mg | ORAL_CAPSULE | Freq: Once | ORAL | Status: AC
  Administered 2023-11-23: 200 mg via ORAL

## 2023-11-23 MED ORDER — CELECOXIB 200 MG PO CAPS
ORAL_CAPSULE | ORAL | Status: AC
  Filled 2023-11-23: qty 1

## 2023-11-23 MED ORDER — AMISULPRIDE (ANTIEMETIC) 5 MG/2ML IV SOLN: 10.0000 mg | Freq: Once | INTRAVENOUS | Status: DC | PRN

## 2023-11-23 MED ORDER — OXYCODONE HCL 5 MG PO TABS
ORAL_TABLET | ORAL | Status: AC
  Filled 2023-11-23: qty 1

## 2023-11-23 MED ORDER — FENTANYL CITRATE (PF) 100 MCG/2ML IJ SOLN
INTRAMUSCULAR | Status: AC
  Filled 2023-11-23: qty 2

## 2023-11-23 MED ORDER — CEFAZOLIN SODIUM-DEXTROSE 2-4 GM/100ML-% IV SOLN
INTRAVENOUS | Status: AC
  Filled 2023-11-23: qty 100

## 2023-11-23 SURGICAL SUPPLY — 65 items
BAG DECANTER FOR FLEXI CONT (MISCELLANEOUS) ×2 IMPLANT
BINDER ABDOMINAL 10 UNV 27-48 (MISCELLANEOUS) IMPLANT
BINDER ABDOMINAL 12 SM 30-45 (SOFTGOODS) IMPLANT
BINDER ABDOMINAL 9 SM 30-45 (SOFTGOODS) IMPLANT
BINDER BREAST LRG (GAUZE/BANDAGES/DRESSINGS) IMPLANT
BINDER BREAST MEDIUM (GAUZE/BANDAGES/DRESSINGS) IMPLANT
BINDER BREAST XLRG (GAUZE/BANDAGES/DRESSINGS) IMPLANT
BINDER BREAST XXLRG (GAUZE/BANDAGES/DRESSINGS) IMPLANT
BLADE HEX COATED 2.75 (ELECTRODE) ×2 IMPLANT
BLADE SURG 15 STRL LF DISP TIS (BLADE) ×4 IMPLANT
BNDG GAUZE DERMACEA FLUFF 4 (GAUZE/BANDAGES/DRESSINGS) ×4 IMPLANT
CANISTER SUCT 1200ML W/VALVE (MISCELLANEOUS) ×2 IMPLANT
COVER BACK TABLE 60X90IN (DRAPES) ×2 IMPLANT
COVER MAYO STAND STRL (DRAPES) ×2 IMPLANT
DERMABOND ADVANCED .7 DNX12 (GAUZE/BANDAGES/DRESSINGS) ×2 IMPLANT
DRAIN CHANNEL 19F RND (DRAIN) IMPLANT
DRAPE LAPAROSCOPIC ABDOMINAL (DRAPES) ×2 IMPLANT
DRESSING MEPILEX FLEX 4X4 (GAUZE/BANDAGES/DRESSINGS) IMPLANT
DRSG MEPILEX FLEX 4X4 (GAUZE/BANDAGES/DRESSINGS) ×6 IMPLANT
ELECT BLADE 4.0 EZ CLEAN MEGAD (MISCELLANEOUS) ×2 IMPLANT
ELECT BLADE 6.5 EXT (BLADE) IMPLANT
ELECT REM PT RETURN 9FT ADLT (ELECTROSURGICAL) ×2 IMPLANT
ELECTRODE BLDE 4.0 EZ CLN MEGD (MISCELLANEOUS) IMPLANT
ELECTRODE REM PT RTRN 9FT ADLT (ELECTROSURGICAL) ×2 IMPLANT
EVACUATOR SILICONE 100CC (DRAIN) IMPLANT
EXTRACTOR CANIST REVOLVE STRL (CANNISTER) ×2 IMPLANT
GAUZE PAD ABD 8X10 STRL (GAUZE/BANDAGES/DRESSINGS) ×4 IMPLANT
GLOVE BIO SURGEON STRL SZ 6.5 (GLOVE) ×6 IMPLANT
GOWN STRL REUS W/ TWL LRG LVL3 (GOWN DISPOSABLE) ×4 IMPLANT
IV LACTATED RINGERS 1000ML (IV SOLUTION) ×4 IMPLANT
KIT FILL ASEPTIC TRANSFER (MISCELLANEOUS) IMPLANT
LINER CANISTER 1000CC FLEX (MISCELLANEOUS) ×2 IMPLANT
NDL HYPO 25X1 1.5 SAFETY (NEEDLE) ×2 IMPLANT
NEEDLE HYPO 25X1 1.5 SAFETY (NEEDLE) ×4 IMPLANT
NS IRRIG 1000ML POUR BTL (IV SOLUTION) ×2 IMPLANT
PACK BASIN DAY SURGERY FS (CUSTOM PROCEDURE TRAY) ×2 IMPLANT
PAD ALCOHOL SWAB (MISCELLANEOUS) ×2 IMPLANT
PENCIL SMOKE EVACUATOR (MISCELLANEOUS) ×2 IMPLANT
PIN SAFETY STERILE (MISCELLANEOUS) IMPLANT
SLEEVE SCD COMPRESS KNEE MED (STOCKING) ×2 IMPLANT
SPIKE FLUID TRANSFER (MISCELLANEOUS) IMPLANT
SPONGE T-LAP 18X18 ~~LOC~~+RFID (SPONGE) ×4 IMPLANT
STAPLER SKIN PROX WIDE 3.9 (STAPLE) IMPLANT
STRIP CLOSURE SKIN 1/2X4 (GAUZE/BANDAGES/DRESSINGS) IMPLANT
STRIP SUTURE WOUND CLOSURE 1/2 (MISCELLANEOUS) IMPLANT
SUT MNCRL AB 4-0 PS2 18 (SUTURE) ×2 IMPLANT
SUT MON AB 3-0 SH27 (SUTURE) ×2 IMPLANT
SUT MON AB 5-0 PS2 18 (SUTURE) ×4 IMPLANT
SUT PDS 3-0 CT2 (SUTURE) ×2 IMPLANT
SUT PDS II 3-0 CT2 27 ABS (SUTURE) IMPLANT
SUT VIC AB 3-0 SH 27X BRD (SUTURE) IMPLANT
SUT VIC AB 4-0 PS2 18 (SUTURE) IMPLANT
SYR 10ML LL (SYRINGE) ×10 IMPLANT
SYR 3ML 18GX1 1/2 (SYRINGE) IMPLANT
SYR 50ML LL SCALE MARK (SYRINGE) ×2 IMPLANT
SYR BULB IRRIG 60ML STRL (SYRINGE) ×2 IMPLANT
SYR CONTROL 10ML LL (SYRINGE) ×2 IMPLANT
SYR TOOMEY 50ML (SYRINGE) IMPLANT
TOWEL GREEN STERILE FF (TOWEL DISPOSABLE) ×4 IMPLANT
TRAY DSU PREP LF (CUSTOM PROCEDURE TRAY) ×2 IMPLANT
TUBE CONNECTING 20X1/4 (TUBING) ×2 IMPLANT
TUBING INFILTRATION IT-10001 (TUBING) ×2 IMPLANT
TUBING SET GRADUATE ASPIR 12FT (MISCELLANEOUS) ×2 IMPLANT
UNDERPAD 30X36 HEAVY ABSORB (UNDERPADS AND DIAPERS) ×4 IMPLANT
YANKAUER SUCT BULB TIP NO VENT (SUCTIONS) ×2 IMPLANT

## 2023-11-23 NOTE — Anesthesia Procedure Notes (Addendum)
 Procedure Name: LMA Insertion Date/Time: 11/23/2023 9:55 AM  Performed by: Lauralyn Primes, CRNAPre-anesthesia Checklist: Patient identified, Emergency Drugs available, Suction available and Patient being monitored Patient Re-evaluated:Patient Re-evaluated prior to induction Oxygen Delivery Method: Circle system utilized Preoxygenation: Pre-oxygenation with 100% oxygen Induction Type: IV induction Ventilation: Mask ventilation without difficulty LMA: LMA inserted LMA Size: 4.0 Number of attempts: 1 Airway Equipment and Method: Bite block Placement Confirmation: positive ETCO2 Tube secured with: Tape Dental Injury: Teeth and Oropharynx as per pre-operative assessment

## 2023-11-23 NOTE — Progress Notes (Signed)
 Patient called requesting different pharmacy for her medications.  I called the [facility] pharmacy and canceled her medications.  I was unable to find [facility] to be able to send the medications to.  I attempted to call the patient but she did not answer.

## 2023-11-23 NOTE — Anesthesia Postprocedure Evaluation (Signed)
 Anesthesia Post Note  Patient: EMERI ESTILL  Procedure(s) Performed: left breast capsule release with grafting to left breast (Left: Breast) LIPOSUCTION, WITH FAT TRANSFER (Left: Abdomen)     Patient location during evaluation: PACU Anesthesia Type: General Level of consciousness: awake and alert Pain management: pain level controlled Vital Signs Assessment: post-procedure vital signs reviewed and stable Respiratory status: spontaneous breathing, nonlabored ventilation and respiratory function stable Cardiovascular status: stable and blood pressure returned to baseline Anesthetic complications: no   No notable events documented.  Last Vitals:  Vitals:   11/23/23 1146 11/23/23 1228  BP: 137/86 (!) 165/92  Pulse: 96 92  Resp: 18 20  Temp:  (!) 36.1 C  SpO2: 94% 94%    Last Pain:  Vitals:   11/23/23 1228  TempSrc: Temporal  PainSc: 4                  Beryle Lathe

## 2023-11-23 NOTE — Anesthesia Procedure Notes (Addendum)
 Procedure Name: LMA Insertion Date/Time: 11/23/2023 10:01 AM  Performed by: Lauralyn Primes, CRNAPre-anesthesia Checklist: Patient identified, Emergency Drugs available, Suction available and Patient being monitored Patient Re-evaluated:Patient Re-evaluated prior to induction Oxygen Delivery Method: Circle system utilized Preoxygenation: Pre-oxygenation with 100% oxygen Induction Type: IV induction Ventilation: Mask ventilation without difficulty LMA: LMA inserted LMA Size: 3.0 Number of attempts: 1 Airway Equipment and Method: Bite block Placement Confirmation: positive ETCO2 Tube secured with: Tape Dental Injury: Teeth and Oropharynx as per pre-operative assessment

## 2023-11-23 NOTE — Op Note (Signed)
 DATE OF OPERATION: 11/23/2023  LOCATION: [facility]  PREOPERATIVE DIAGNOSIS: left breast capsule contracture with asymmetry  POSTOPERATIVE DIAGNOSIS: Same  PROCEDURE: Release of left breast capsule contracture with fat grafting  SURGEON: Foster Simpson, DO  ASSISTANT: Keenan Bachelor, PA  EBL: none  CONDITION: Stable  COMPLICATIONS: None  INDICATION: The patient, Penny Kim, is a 53 y.o. female born on 1970/10/24, is here for treatment of breast asymmetry after reconstruction and left lateral breast contracture from the capsule.   PROCEDURE DETAILS:  The patient was seen prior to surgery and marked.  The IV antibiotics were given. The patient was taken to the operating room and given a general anesthetic. A standard time out was performed and all information was confirmed by those in the room. SCDs were placed.   The abdomen and breast were prepped and draped.  Local was placed in the lower abdominal incision sites.  The #15 blade was used to make a 5 mm incision on each side of the lower abdomen.  The tumescence was then infused.  We placed 300 cc of tumescent. Liposuction was then done and collected in the [facility].  The fat was prepared according to the manufacture guidelines. The fat was then placed in 12 cc syringes.    While the fat was being prepared, the left lateral breast was addressed.  A 4 mm incision was made at the lower lateral inframammary fold area. The forked knife was placed into the lateral capsule and used to cut and release the contracture. The area was 100 cm2.  Once that was complete the fat in the syringes was injected into the left lateral breast. A total of 140 cc of fat was injected.  This will hopefully prevent reformation of the capsule and contracture.  All incisions were closed with the 4-0 Monocryl. Dressings were applied. The patient was allowed to wake up and taken to recovery room in stable condition at the end of the case. The family  was notified at the end of the case.   The advanced practice practitioner (APP) assisted throughout the case.  The APP was essential in retraction and counter traction when needed to make the case progress smoothly.  This retraction and assistance made it possible to see the tissue plans for the procedure.  The assistance was needed for blood control, tissue re-approximation and assisted with closure of the incision site.

## 2023-11-23 NOTE — Transfer of Care (Signed)
 Immediate Anesthesia Transfer of Care Note  Patient: Penny Kim  Procedure(s) Performed: left breast capsule release with grafting to left breast (Left: Breast) LIPOSUCTION, WITH FAT TRANSFER (Left: Abdomen)  Patient Location: PACU  Anesthesia Type:General  Level of Consciousness: drowsy  Airway & Oxygen Therapy: Patient Spontanous Breathing and Patient connected to face mask oxygen  Post-op Assessment: Report given to RN and Post -op Vital signs reviewed and stable  Post vital signs: Reviewed and stable  Last Vitals:  Vitals Value Taken Time  BP 131/82 (97)   Temp    Pulse 105   Resp 20   SpO2 95     Last Pain:  Vitals:   11/23/23 0754  TempSrc: Temporal  PainSc: 6       Patients Stated Pain Goal: 6 (11/23/23 0754)  Complications: No notable events documented.

## 2023-11-23 NOTE — Discharge Instructions (Addendum)
 INSTRUCTIONS FOR AFTER BREAST SURGERY   You will likely have some questions about what to expect following your operation.  The following information will help you and your family understand what to expect when you are discharged from the hospital.  It is important to follow these guidelines to help ensure a smooth recovery and reduce complication.  Postoperative instructions include information on: diet, wound care, medications and physical activity.  AFTER SURGERY Expect to go home after the procedure.  In some cases, you may need to spend one night in the hospital for observation.  DIET Breast surgery does not require a specific diet.  However, the healthier you eat the better your body will heal. It is important to increasing your protein intake.  This means limiting the foods with sugar and carbohydrates.  Focus on vegetables and some meat.  If you have liposuction during your procedure be sure to drink water.  If your urine is bright yellow, then it is concentrated, and you need to drink more water.  As a general rule after surgery, you should have 8 ounces of water every hour while awake.  If you find you are persistently nauseated or unable to take in liquids let us know.  NO TOBACCO USE or EXPOSURE.  This will slow your healing process and lead to a wound.  WOUND CARE Leave the binder on for 3 days . Use fragrance free soap like Dial, Dove or [place].   After 3 days you can remove the binder to shower. Once dry apply binder or sports bra. If you have liposuction you will have a soft and spongy dressing (Lipofoam) that helps prevent creases in your skin.  Remove before you shower and then replace it.  It is also available on [employer]. If you have steri-strips / tape directly attached to your skin leave them in place. It is OK to get these wet.   No baths, pools or hot tubs for four weeks. We close your incision to leave the smallest and best-looking scar. No ointment or creams on your incisions  for four weeks.  No Neosporin (Too many skin reactions).  A few weeks after surgery you can use Mederma and start massaging the scar. We ask you to wear your binder or sports bra for the first 6 weeks around the clock, including while sleeping. This provides added comfort and helps reduce the fluid accumulation at the surgery site. NO Ice or heating pads to the operative site.  You have a very high risk of a BURN before you feel the temperature change.  ACTIVITY No heavy lifting until cleared by the doctor.  This usually means no more than a half-gallon of milk.  It is OK to walk and climb stairs. Moving your legs is very important to decrease your risk of a blood clot.  It will also help keep you from getting deconditioned.  Every 1 to 2 hours get up and walk for 5 minutes. This will help with a quicker recovery back to normal.  Let pain be your guide so you don't do too much.  This time is for you to recover.  You will be more comfortable if you sleep and rest with your head elevated either with a few pillows under you or in a recliner.  No stomach sleeping for a three months.  WORK Everyone returns to work at different times. As a rough guide, most people take at least 1 - 2 weeks off prior to returning to work. If  you need documentation for your job, give the forms to the front staff at the clinic.  DRIVING Arrange for someone to bring you home from the hospital after your surgery.  You may be able to drive a few days after surgery but not while taking any narcotics or valium.  BOWEL MOVEMENTS Constipation can occur after anesthesia and while taking pain medication.  It is important to stay ahead for your comfort.  We recommend taking Milk of Magnesia (2 tablespoons; twice a day) while taking the pain pills.  MEDICATIONS You may be prescribed should start after surgery At your preoperative visit for you history and physical you may have been given the following medications: An antibiotic: Start  this medication when you get home and take according to the instructions on the bottle. Zofran 4 mg:  This is to treat nausea and vomiting.  You can take this every 6 hours as needed and only if needed. Valium 2 mg for breast cancer patients: This is for muscle tightness if you have an implant or expander. This will help relax your muscle which also helps with pain control.  This can be taken every 12 hours as needed. Don't drive after taking this medication. Norco (hydrocodone/acetaminophen) 5/325 mg:  This is only to be used after you have taken the Motrin or the Tylenol. Every 8 hours as needed.   Over the counter Medication to take: Ibuprofen (Motrin) 600 mg:  Take this every 6 hours.  If you have additional pain then take 500 mg of the Tylenol every 8 hours.  Only take the Norco after you have tried these two. MiraLAX or Milk of Magnesia: Take this according to the bottle if you take the Norco.  WHEN TO CALL Call your surgeon's office if any of the following occur: Fever 101 degrees F or greater Excessive bleeding or fluid from the incision site. Pain that increases over time without aid from the medications Redness, warmth, or pus draining from incision sites Persistent nausea or inability to take in liquids Severe misshapen area that underwent the operation.    Post Anesthesia Home Care Instructions  Activity: Get plenty of rest for the remainder of the day. A responsible individual must stay with you for 24 hours following the procedure.  For the next 24 hours, DO NOT: -Drive a car -Advertising copywriter -Drink alcoholic beverages -Take any medication unless instructed by your physician -Make any legal decisions or sign important papers.  Meals: Start with liquid foods such as gelatin or soup. Progress to regular foods as tolerated. Avoid greasy, spicy, heavy foods. If nausea and/or vomiting occur, drink only clear liquids until the nausea and/or vomiting subsides. Call your  physician if vomiting continues.  Special Instructions/Symptoms: Your throat may feel dry or sore from the anesthesia or the breathing tube placed in your throat during surgery. If this causes discomfort, gargle with warm salt water. The discomfort should disappear within 24 hours.  If you had a scopolamine patch placed behind your ear for the management of post- operative nausea and/or vomiting:  1. The medication in the patch is effective for 72 hours, after which it should be removed.  Wrap patch in a tissue and discard in the trash. Wash hands thoroughly with soap and water. 2. You may remove the patch earlier than 72 hours if you experience unpleasant side effects which may include dry mouth, dizziness or visual disturbances. 3. Avoid touching the patch. Wash your hands with soap and water after contact with  the patch.    Information for Discharge Teaching: EXPAREL (bupivacaine liposome injectable suspension)   Pain relief is important to your recovery. The goal is to control your pain so you can move easier and return to your normal activities as soon as possible after your procedure. Your physician may use several types of medicines to manage pain, swelling, and more.  Your surgeon or anesthesiologist gave you EXPAREL(bupivacaine) to help control your pain after surgery.  EXPAREL is a local anesthetic designed to release slowly over an extended period of time to provide pain relief by numbing the tissue around the surgical site. EXPAREL is designed to release pain medication over time and can control pain for up to 72 hours. Depending on how you respond to EXPAREL, you may require less pain medication during your recovery. EXPAREL can help reduce or eliminate the need for opioids during the first few days after surgery when pain relief is needed the most. EXPAREL is not an opioid and is not addictive. It does not cause sleepiness or sedation.   Important! A teal colored band has been  placed on your arm with the date, time and amount of EXPAREL you have received. Please leave this armband in place for the full 96 hours following administration, and then you may remove the band. If you return to the hospital for any reason within 96 hours following the administration of EXPAREL, the armband provides important information that your health care providers to know, and alerts them that you have received this anesthetic.    Possible side effects of EXPAREL: Temporary loss of sensation or ability to move in the area where medication was injected. Nausea, vomiting, constipation Rarely, numbness and tingling in your mouth or lips, lightheadedness, or anxiety may occur. Call your doctor right away if you think you may be experiencing any of these sensations, or if you have other questions regarding possible side effects.  Follow all other discharge instructions given to you by your surgeon or nurse. Eat a healthy diet and drink plenty of water or other fluids.  Next dose of tylenol if needed will be at 2pm

## 2023-11-23 NOTE — Anesthesia Preprocedure Evaluation (Addendum)
 Anesthesia Evaluation  Patient identified by MRN, date of birth, ID band Patient awake    Reviewed: Allergy & Precautions, NPO status , Patient's Chart, lab work & pertinent test results  History of Anesthesia Complications Negative for: history of anesthetic complications  Airway Mallampati: III  TM Distance: >3 FB Neck ROM: Full    Dental  (+) Dental Advisory Given, Teeth Intact   Pulmonary neg pulmonary ROS   Pulmonary exam normal        Cardiovascular hypertension, Pt. on medications Normal cardiovascular exam     Neuro/Psych  PSYCHIATRIC DISORDERS Anxiety Depression     OCD negative neurological ROS     GI/Hepatic ,GERD  Controlled,,(+)     substance abuse (benzodiazepine abuse)  cocaine use  Endo/Other  negative endocrine ROS    Renal/GU negative Renal ROS     Musculoskeletal negative musculoskeletal ROS (+)    Abdominal   Peds  Hematology negative hematology ROS (+)   Anesthesia Other Findings   Reproductive/Obstetrics  Breast cancer                              Anesthesia Physical Anesthesia Plan  ASA: 2  Anesthesia Plan: General   Post-op Pain Management: Tylenol PO (pre-op)* and Celebrex PO (pre-op)*   Induction: Intravenous  PONV Risk Score and Plan: 3 and Treatment may vary due to age or medical condition, Ondansetron, Dexamethasone and Midazolam  Airway Management Planned: LMA  Additional Equipment: None  Intra-op Plan:   Post-operative Plan: Extubation in OR  Informed Consent: I have reviewed the patients History and Physical, chart, labs and discussed the procedure including the risks, benefits and alternatives for the proposed anesthesia with the patient or authorized representative who has indicated his/her understanding and acceptance.     Dental advisory given  Plan Discussed with: CRNA and Anesthesiologist  Anesthesia Plan Comments:         Anesthesia Quick Evaluation

## 2023-11-23 NOTE — Interval H&P Note (Signed)
 History and Physical Interval Note:  11/23/2023 9:06 AM  Penny Kim  has presented today for surgery, with the diagnosis of Breast asymmetry following reconstructive surgery.  The various methods of treatment have been discussed with the patient and family. After consideration of risks, benefits and other options for treatment, the patient has consented to  Procedure(s): left breast capsule excision with possible grafting and tissue rearrangement (Left) LIPOSUCTION, WITH FAT TRANSFER (Left) as a surgical intervention.  The patient's history has been reviewed, patient examined, no change in status, stable for surgery.  I have reviewed the patient's chart and labs.  Questions were answered to the patient's satisfaction.     [name]

## 2023-11-23 NOTE — Anesthesia Procedure Notes (Signed)
 Procedure Name: Intubation Date/Time: 11/23/2023 10:22 AM  Performed by: Lauralyn Primes, CRNAPre-anesthesia Checklist: Patient identified, Emergency Drugs available, Suction available and Patient being monitored Patient Re-evaluated:Patient Re-evaluated prior to induction Oxygen Delivery Method: Circle system utilized Preoxygenation: Pre-oxygenation with 100% oxygen Induction Type: IV induction Ventilation: Mask ventilation without difficulty Laryngoscope Size: Glidescope and 3 Grade View: Grade I Tube type: Oral Tube size: 7.0 mm Number of attempts: 1 Airway Equipment and Method: Video-laryngoscopy, Rigid stylet and Bite block Placement Confirmation: ETT inserted through vocal cords under direct vision, positive ETCO2 and breath sounds checked- equal and bilateral Secured at: 22 cm Tube secured with: Tape Dental Injury: Teeth and Oropharynx as per pre-operative assessment

## 2023-11-24 ENCOUNTER — Encounter (HOSPITAL_BASED_OUTPATIENT_CLINIC_OR_DEPARTMENT_OTHER): Payer: Self-pay | Admitting: Plastic Surgery

## 2023-11-24 NOTE — Telephone Encounter (Signed)
 This patient left two voicemails. The first one did state she wanted the [facility] for the prescription, then in the second one she stated that the [facility] would work and she would like the prescription to go there. At first there was an issue with her insurance covering the medication but it was resolved.

## 2023-11-24 NOTE — Telephone Encounter (Signed)
 Patient called and said the issue has been resolved and to Thank You!

## 2023-12-01 ENCOUNTER — Ambulatory Visit: Payer: Medicare Other | Admitting: Surgical

## 2023-12-01 ENCOUNTER — Encounter: Payer: Self-pay | Admitting: Surgical

## 2023-12-01 VITALS — BP 132/81 | HR 113 | Ht 64.5 in | Wt 141.0 lb

## 2023-12-01 DIAGNOSIS — N651 Disproportion of reconstructed breast: Secondary | ICD-10-CM

## 2023-12-01 DIAGNOSIS — T8544XA Capsular contracture of breast implant, initial encounter: Secondary | ICD-10-CM

## 2023-12-01 DIAGNOSIS — Z9013 Acquired absence of bilateral breasts and nipples: Secondary | ICD-10-CM

## 2023-12-01 NOTE — Progress Notes (Signed)
 53 year old female here for follow-up after release of left breast capsule contracture with fat grafting with Dr. Ulice Bold on 11/23/2023.  She is 8 days postop.  She reports she is doing really well, reports that she is bruised on both her left breast and abdomen.  She reports she is feeling well today.  She does not have any specific concerns. She has been wearing compression on her breasts and abdomen without issue.  She reports that she does have some itching from the garments and has used a white T-shirt to help with the comfort.  She is not having any infectious symptoms, does report that she has had some constipation but has been using stool softeners which have been helpful.  Chaperone present on exam On exam abdominal incisions are intact, abdominal bruising and ecchymosis is noted, as expected.  No significant swelling is noted.  Left breast incision is intact, ecchymosis is noted of the left breast.  There is no erythema or cellulitic changes.  Minimal tenderness noted with palpation.  A/P:  Discussed continue with compressive garments.  Avoiding heavy lifting or strenuous activities. Discussed continuing with TOPifoam over abdomen and left breast.    Recommend calling with questions or concerns.  She has a follow-up for next week.  Will plan to take pictures at her next appointment once the swelling has improved.

## 2023-12-02 ENCOUNTER — Encounter: Payer: Medicare Other | Admitting: Surgical

## 2023-12-05 ENCOUNTER — Telehealth: Payer: Self-pay | Admitting: Physician Assistant

## 2023-12-06 ENCOUNTER — Inpatient Hospital Stay: Payer: Medicare Other | Admitting: Physician Assistant

## 2023-12-09 ENCOUNTER — Inpatient Hospital Stay: Attending: Physician Assistant | Admitting: Physician Assistant

## 2023-12-13 ENCOUNTER — Encounter: Payer: Medicare Other | Admitting: Plastic Surgery

## 2023-12-20 ENCOUNTER — Ambulatory Visit (INDEPENDENT_AMBULATORY_CARE_PROVIDER_SITE_OTHER): Admitting: Plastic Surgery

## 2023-12-20 VITALS — BP 108/71 | HR 80

## 2023-12-20 DIAGNOSIS — T8544XA Capsular contracture of breast implant, initial encounter: Secondary | ICD-10-CM

## 2023-12-20 DIAGNOSIS — Z9889 Other specified postprocedural states: Secondary | ICD-10-CM

## 2023-12-20 NOTE — Progress Notes (Signed)
 The patient is a 53 year old female here for follow-up after undergoing revision of breast reconstruction.  She had release of the left breast with fat grafting.  The area looks amazing.  The pre and postop pictures are very impressive.  The patient feels like she is got a little bit of a cold today but otherwise she is doing really well.  There is no sign of infection.  No sign of a hematoma or seroma.  She should continue with either the binders or the sports bra and spanks.  Will plan to see her back at her 2-week follow-up and then in 1 year.  Pictures were obtained of the patient and placed in the chart with the patient's or guardian's permission.

## 2023-12-26 ENCOUNTER — Ambulatory Visit: Payer: Medicare Other | Admitting: Dermatology

## 2023-12-30 ENCOUNTER — Encounter: Payer: Medicare Other | Admitting: Surgical

## 2024-01-04 ENCOUNTER — Ambulatory Visit: Admitting: Surgical

## 2024-01-04 VITALS — BP 154/89 | HR 108

## 2024-01-04 DIAGNOSIS — Z9889 Other specified postprocedural states: Secondary | ICD-10-CM

## 2024-01-04 DIAGNOSIS — T8544XA Capsular contracture of breast implant, initial encounter: Secondary | ICD-10-CM

## 2024-01-04 DIAGNOSIS — N651 Disproportion of reconstructed breast: Secondary | ICD-10-CM

## 2024-01-04 DIAGNOSIS — Z9013 Acquired absence of bilateral breasts and nipples: Secondary | ICD-10-CM

## 2024-01-04 NOTE — Progress Notes (Signed)
 53 year old female here for follow-up on her breast reconstruction.  She recently underwent fat grafting the left breast.  She is 6 weeks postop from surgery on 11/23/2023.  Patient reports she is overall doing well.  She is not having any specific issues or concerns today.   Chaperone present on exam On exam left breast incision is intact, well-healed.  Left breast fat grafting site is soft, nontender.  She does have some slight firmness noted over the fat grafting site with some small areas of fat grafting lumps noted throughout.  There is no overlying skin changes.  I do not notice any overt fat necrosis.  A/P:  Patient is doing well, there is no signs infection or concern on exam.  Discussed no restrictions at this time, increase activity as tolerated.  Continue to wear compressive bra when active.  We discussed that she may continue to notice some changes over the next few months as the fat that was grafted begins to take or not take depending on blood supply to the fat.  Recommend following up in 1 year for reevaluation.

## 2024-02-06 ENCOUNTER — Other Ambulatory Visit: Payer: Self-pay | Admitting: Internal Medicine

## 2024-02-14 ENCOUNTER — Inpatient Hospital Stay: Admission: RE | Admit: 2024-02-14 | Payer: Medicare Other | Source: Ambulatory Visit

## 2024-05-09 ENCOUNTER — Ambulatory Visit: Admitting: Internal Medicine

## 2024-05-26 ENCOUNTER — Encounter: Payer: Self-pay | Admitting: Podiatry

## 2024-05-28 ENCOUNTER — Telehealth: Payer: Self-pay | Admitting: Plastic Surgery

## 2024-05-28 ENCOUNTER — Other Ambulatory Visit: Payer: Self-pay | Admitting: Podiatry

## 2024-05-28 MED ORDER — CLOBETASOL PROPIONATE 0.05 % EX OINT: 1.0000 | TOPICAL_OINTMENT | Freq: Two times a day (BID) | CUTANEOUS | 2 refills | Status: AC

## 2024-05-28 NOTE — Telephone Encounter (Signed)
 Called [facility]mail for pt to be r/s

## 2024-05-29 NOTE — Telephone Encounter (Signed)
 Spoke to patient is aware of Rx called to correct pharmacy.

## 2024-06-01 ENCOUNTER — Ambulatory Visit: Admitting: Podiatry

## 2024-06-08 ENCOUNTER — Ambulatory Visit: Admitting: Internal Medicine

## 2024-06-13 ENCOUNTER — Ambulatory Visit: Attending: Nurse Practitioner | Admitting: Nurse Practitioner

## 2024-06-13 ENCOUNTER — Encounter: Payer: Self-pay | Admitting: Nurse Practitioner

## 2024-06-13 VITALS — BP 110/60 | HR 91 | Ht 64.5 in | Wt 137.4 lb

## 2024-06-13 DIAGNOSIS — I10 Essential (primary) hypertension: Secondary | ICD-10-CM | POA: Insufficient documentation

## 2024-06-13 DIAGNOSIS — G4730 Sleep apnea, unspecified: Secondary | ICD-10-CM | POA: Diagnosis present

## 2024-06-13 DIAGNOSIS — R0602 Shortness of breath: Secondary | ICD-10-CM | POA: Diagnosis present

## 2024-06-13 MED ORDER — AMLODIPINE BESYLATE 10 MG PO TABS: 10.0000 mg | ORAL_TABLET | Freq: Every day | ORAL | 3 refills | Status: AC

## 2024-06-13 NOTE — Progress Notes (Signed)
 Office Visit    Patient Name: Penny Kim Date of Encounter: 06/13/2024  Primary Care Provider:  Buren Rock HERO, MD Primary Cardiologist:  Penny Hanson, MD  Cardiology APP:  Penny Penny Ingle, NP   Chief Complaint    53 y.o. female with a history of orthostatic hypotension and syncope, right-sided breast cancer status post bilateral mastectomies with reconstruction chemotherapy in January 2012, anxiety, cocaine use, and depression, who presents for follow-up related to dyspnea.  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 TTE 10/2016: EF 50-55%, no RWMA, normal LV diastolic function, left atrium normal, RV systolic function normal, PASP normal   History of stress test    a. treadmill Myoview  11/2016: small defect of mild severity present in the apex location felt to be secondary to breast attenuation. EF 55-65%. Normal study   HTN (hypertension)    Orthostatic hypotension    Psoriasis    Past Surgical History:  Procedure Laterality Date   BREAST IMPLANT REMOVAL Bilateral 06/16/2023   Procedure: REMOVAL BREAST IMPLANTS;  Surgeon: Lowery Estefana RAMAN, DO;  Location: [facility];  Service: Plastics;  Laterality: Bilateral;   BREAST RECONSTRUCTION  01/06/2012   Procedure: BREAST RECONSTRUCTION;  Surgeon: Alm Sick, MD;  Location: [facility];  Service: Plastics;  Laterality: Bilateral;  bilateral removal of tissue expanders, bilateral placement of  implants--Breast   BREAST SURGERY  06/19/2010   exploration of right mastectomy site due to post-op bleeding   CAPSULECTOMY Left 11/23/2023   Procedure: left breast capsule release with grafting to left breast;  Surgeon: Lowery Estefana RAMAN, DO;  Location: [facility];  Service: Plastics;  Laterality: Left;   COLONOSCOPY WITH PROPOFOL  N/A 10/07/2023   Procedure: COLONOSCOPY WITH PROPOFOL ;  Surgeon: Unk Corinn Skiff, MD;  Location: [facility];  Service: Gastroenterology;  Laterality: N/A;   INSERTION OF TISSUE EXPANDER AFTER MASTECTOMY  06/19/2010   bilat. mastectomies with bilat. tissue expanders   LIPOSUCTION WITH LIPOFILLING Left 11/23/2023   Procedure: LIPOSUCTION, WITH FAT TRANSFER;  Surgeon: Lowery Estefana RAMAN, DO;  Location: [facility];  Service: Plastics;  Laterality: Left;   PLACEMENT OF BREAST IMPLANTS Bilateral 06/16/2023   Procedure: bilateral removal and replacement of saline implants;  Surgeon: Lowery Estefana RAMAN, DO;  Location: [facility];  Service: Plastics;  Laterality: Bilateral;   POLYPECTOMY  10/07/2023   Procedure: POLYPECTOMY;  Surgeon: Unk Corinn Skiff, MD;  Location: [facility];  Service: Gastroenterology;;   [facility] REMOVAL  11/26/2010   PORTACATH PLACEMENT  07/23/2010   TISSUE EXPANDER REMOVAL  10/20/2010   left    Allergies  Allergies  Allergen Reactions   Prochlorperazine Other (See Comments)    Syncope   Propranolol  Rash   Tape Rash   Wound Dressing Adhesive Rash       History of Present Illness      53 y.o. y/o female with the above past  medical history including orthostatic hypotension with syncope, hypertension, right-sided breast cancer status post bilateral mastectomies with reconstruction and chemotherapy (doxorubicin/cyclophosphamide, paclitaxel) in January 2012, anxiety, depression, cocaine use, and chronic pain.  She was previously evaluated in January 2017 in the setting of syncope.  Echo in  early 2018 showed normal LV function.  Stress testing was completed in March 2018 showing a small defect of mild severity in the apex, felt to be secondary to breast attenuation.  No ischemia was noted.       [name] was last seen in cardiology clinic in May 2024, at which time she was no longer using cocaine and was otherwise feeling well.  Over the past year, she has been dealing with ongoing septal issues in the setting of prior cocaine use and saddlenose deformity.  She was seen by ENT yesterday with plan for reconstructive surgery.  She says that over the past year, she has noted dyspnea on exertion.  She is not sure if this might be related to deconditioning, as her activity levels have dropped overall.  She can only walk about 50 yards prior to having to rest.  She does not experience chest pain.  She denies palpitations, PND, orthopnea, dizziness, syncope, edema, or early satiety.  Today, patient says that she frequently awakens in the middle of the night gasping for air.  She believes that she snores.  She has never had a sleep study but is interested in pursuing. Objective   Home Medications    Current Outpatient Medications  Medication Sig Dispense Refill   acyclovir  (ZOVIRAX ) 400 MG tablet Take 400 mg by mouth 3 (three) times daily.     amitriptyline (ELAVIL) 50 MG tablet Take 50 mg by mouth at bedtime.     Cholecalciferol 50 MCG (2000 UT) CAPS Take 2,000 Units by mouth daily.     clobetasol  ointment (TEMOVATE ) 0.05 % Apply 1 Application topically 2 (two) times daily. 45 g 2   clonazePAM  (KLONOPIN ) 1 MG tablet Take 1 mg by mouth 3 (three) times daily as needed.     gabapentin  (NEURONTIN ) 300 MG capsule Take 300 mg by mouth 4 (four) times daily as needed (For pain).     ibuprofen  (ADVIL ) 800 MG tablet Take 800 mg by mouth 3 (three) times daily.     TRINTELLIX  10 MG TABS tablet Take 10 mg by mouth daily.     amLODipine  (NORVASC ) 10 MG tablet Take 1 tablet (10 mg total) by mouth daily.  Please schedule appointment for future refills 90 tablet 3   Fluocinolone  Acetonide Scalp 0.01 % OIL Apply 1-2 times daily to scalp as needed. 118 mL 2   hydrOXYzine  (ATARAX ) 25 MG tablet Take 1 tablet by mouth 3 (three) times daily as needed. (Patient not taking: Reported on 06/13/2024)     ondansetron  (ZOFRAN ) 4 MG tablet Take 1 tablet (4 mg total) by mouth every 8 (eight) hours as needed for nausea or vomiting. 20 tablet 0   No current facility-administered medications for this visit.     Physical Exam    VS:  BP 110/60 (BP Location: Left Arm, Patient Position: Sitting, Cuff Size: Normal)   Pulse 91   Ht 5' 4.5 (1.638 m)   Wt 137 lb 6 oz (62.3 kg)   LMP 09/21/2010   SpO2 97%   BMI 23.22 kg/m  , BMI Body mass index is 23.22 kg/m.    STOP-Bang Score:  4        GEN: Well nourished,  well developed, in no acute distress. HEENT: normal. Neck: Supple, no JVD, carotid bruits, or masses. Cardiac: RRR, no murmurs, rubs, or gallops. No clubbing, cyanosis, edema.  Radials 2+/PT 2+ and equal bilaterally.  Respiratory:  Respirations regular and unlabored, clear to auscultation bilaterally. GI: Soft, nontender, nondistended, BS + x 4. MS: no deformity or atrophy. Skin: warm and dry, no rash. Neuro:  Strength and sensation are intact. Psych: Normal affect.  Accessory Clinical Findings    ECG personally reviewed by me today - EKG Interpretation Date/Time:  Wednesday June 13 2024 14:51:56 EDT Ventricular Rate:  91 PR Interval:  126 QRS Duration:  76 QT Interval:  344 QTC Calculation: 423 R Axis:   46  Text Interpretation: Normal sinus rhythm Nonspecific T wave abnormality Confirmed by Penny Bruckner [phone]) on 06/13/2024 2:59:43 PM  - no acute changes.  Lab Results  Component Value Date   WBC 2.4 (L) 12/15/2022   HGB 11.7 (L) 12/15/2022   HCT 38.4 12/15/2022   MCV 80.8 12/15/2022   PLT 267 12/15/2022   Lab Results  Component Value Date   CREATININE 0.83 09/18/2022    BUN 14 09/18/2022   NA 137 09/18/2022   K 3.5 09/18/2022   CL 103 09/18/2022   CO2 25 09/18/2022   Lab Results  Component Value Date   ALT 19 09/18/2022   AST 30 09/18/2022   ALKPHOS 55 09/18/2022   BILITOT 0.7 09/18/2022   Lab Results  Component Value Date   CHOL 217 (H) 09/01/2022   HDL 77 09/01/2022   LDLCALC 125 (H) 09/01/2022   LDLDIRECT 124 (H) 09/01/2017   TRIG 77 09/01/2022   CHOLHDL 2.8 09/01/2022    Lab Results  Component Value Date   HGBA1C 6.1 (H) 09/01/2022   Lab Results  Component Value Date   TSH 1.372 09/01/2022       Assessment & Plan    1.  Primary hypertension: Blood pressure controlled on amlodipine  therapy.  Refilling today.  2.  Dyspnea on exertion: Over the past year, patient has noted progressive dyspnea on exertion.  She can only walk about 50 yards prior to having to take rest.  She is euvolemic on examination today.  Will plan for follow-up echocardiogram.  3.  Precordial chest pain: Patient with prior history of chest pain dating back to breast cancer diagnosis and breast reconstruction but denies any chest pain today.  Prior stress testing was low risk.  Follow-up echo in the setting of dyspnea.  No further ischemic evaluation planned at this time.  4.  Anxiety/substance abuse: No longer using cocaine.  Followed by psychiatry.  5.  Sleep disordered breathing: Patient notes that it is not uncommon for her to awake suddenly and gasping for air.  She believes that she snores and often feels tired throughout the day.  STOP-BANG equals 4.  Will arrange for pulm evaluation for sleep study.  6.  Disposition: Follow-up echocardiogram.  In office follow-up recommendation pending results.  Bruckner Vivienne, NP 06/13/2024, 6:19 PM

## 2024-06-13 NOTE — Patient Instructions (Signed)
 Medication Instructions:   Your physician recommends that you continue on your current medications as directed. Please refer to the Current Medication list given to you today.   *If you need a refill on your cardiac medications before your next appointment, please call your pharmacy*  Lab Work: No labs ordered today  If you have labs (blood work) drawn today and your tests are completely normal, you will receive your results only by: MyChart Message (if you have MyChart) OR A paper copy in the mail If you have any lab test that is abnormal or we need to change your treatment, we will call you to review the results.  Testing/Procedures: Your physician has requested that you have an echocardiogram. Echocardiography is a painless test that uses sound waves to create images of your heart. It provides your doctor with information about the size and shape of your heart and how well your heart's chambers and valves are working.   You may receive an ultrasound enhancing agent through an IV if needed to better visualize your heart during the echo. This procedure takes approximately one hour.  There are no restrictions for this procedure.  This will take place at [address] [facility]) [address], [place] [address]  Please note: We ask at that you not bring children with you during ultrasound (echo/ vascular) testing. Due to room size and safety concerns, children are not allowed in the ultrasound rooms during exams. Our front office staff cannot provide observation of children in our lobby area while testing is being conducted. An adult accompanying a patient to their appointment will only be allowed in the ultrasound room at the discretion of the ultrasound technician under special circumstances. We apologize for any inconvenience.   Follow-Up: At [facility], you and your health needs are our priority.  As part of our continuing mission to provide you with exceptional heart  care, our providers are all part of one team.  This team includes your primary Cardiologist (physician) and Advanced Practice Providers or APPs (Physician Assistants and Nurse Practitioners) who all work together to provide you with the care you need, when you need it.  Your next appointment:   12 month(s)  Provider:   You may see Lonni Hanson, MD or one of the following Advanced Practice Providers on your designated Care Team:   Lonni Meager, NP   We recommend signing up for the patient portal called MyChart.  Sign up information is provided on this After Visit Summary.  MyChart is used to connect with patients for Virtual Visits (Telemedicine).  Patients are able to view lab/test results, encounter notes, upcoming appointments, etc.  Non-urgent messages can be sent to your provider as well.   To learn more about what you can do with MyChart, go to [email or web address].

## 2024-06-14 ENCOUNTER — Ambulatory Visit: Payer: Self-pay

## 2024-06-14 NOTE — Telephone Encounter (Signed)
 FYI Only or Action Required?: FYI only for provider.  New patient for pulmonary. Called to make new patient appointment. Transferred to PAS for scheduling  Called Nurse Triage reporting Shortness of Breath.  Symptoms began several years ago.  Interventions attempted: Nothing.  Symptoms are: unchanged.  Triage Disposition: See PCP Within 2 Weeks  Patient/caregiver understands and will follow disposition?: Yes  Copied from CRM [id number]. Topic: Clinical - Red Word Triage >> Jun 14, 2024  1:13 PM Penny Kim wrote: Red Word that prompted transfer to Nurse Triage: SOB/wake up gasping for air/has breathing nightmares/ Reason for Disposition  [1] MILD longstanding difficulty breathing (e.g., minimal/no SOB at rest, SOB with walking, pulse < 100) AND [2] SAME as normal  Answer Assessment - Initial Assessment Questions 1. RESPIRATORY STATUS: Describe your breathing? (e.g., wheezing, shortness of breath, unable to speak, severe coughing)      Shortness of breath 2. ONSET: When did this breathing problem begin?      Started a couple of year ago 3. PATTERN Does the difficult breathing come and go, or has it been constant since it started?      constant 4. SEVERITY: How bad is your breathing? (e.g., mild, moderate, severe)      Mild-moderate 5. RECURRENT SYMPTOM: Have you had difficulty breathing before? If Yes, ask: When was the last time? and What happened that time?      [name] been going on for a few yars 6. CARDIAC HISTORY: Do you have any history of heart disease? (e.g., heart attack, angina, bypass surgery, angioplasty)      no 7. LUNG HISTORY: Do you have any history of lung disease?  (e.g., pulmonary embolus, asthma, emphysema)     no 8. CAUSE: What do you think is causing the breathing problem?      unsure 9. OTHER SYMPTOMS: Do you have any other symptoms? (e.g., chest pain, cough, dizziness, fever, runny nose)     no 10. O2 SATURATION MONITOR:  Do you use an  oxygen saturation monitor (pulse oximeter) at home? If Yes, ask: What is your reading (oxygen level) today? What is your usual oxygen saturation reading? (e.g., 95%)       95 12. TRAVEL: Have you traveled out of the country in the last month? (e.g., travel history, exposures)       no  Protocols used: Breathing Difficulty-A-AH

## 2024-06-20 ENCOUNTER — Ambulatory Visit: Admitting: Sleep Medicine

## 2024-06-25 ENCOUNTER — Ambulatory Visit: Admitting: Sleep Medicine

## 2024-06-29 ENCOUNTER — Other Ambulatory Visit: Payer: Self-pay | Admitting: Otolaryngology

## 2024-06-29 NOTE — Progress Notes (Addendum)
????????????????????????  °  Patient:?  Penny Kim DOB:?  [birth year]  Sex:   female EMRN:?  [id number]  Encounter Date:  06/29/2024     ?  ?  Orders  ?  FACILITY:?? [facility] ?  SURGEON:?? Elspeth Coddington, MD ?  SURGERY DATE:?   ?10/12/24 Fri??????????TIME: 12:30 pm?????????????????????   ?  ANESTHESIA:??General  ?  STATUS:?? Outpatient ?  PROCEDURE:??Rhinoplasty - 30410                          Cadaveric Rib Grafting - 21210 ?  DIAGNOSIS:? 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  ?  LATEX ALLERGY: ?  ?  EQUIPMENT: ????????????  ?  INSURANCE:?  MEDICARE A&B # [id number]   SECONDARY:??MEDICAID [place] # ?[id number] L ??  ??  PRECERT#: ?

## 2024-07-03 ENCOUNTER — Encounter (HOSPITAL_COMMUNITY): Payer: Self-pay | Admitting: Otolaryngology

## 2024-07-03 ENCOUNTER — Other Ambulatory Visit: Payer: Self-pay

## 2024-07-03 NOTE — Progress Notes (Signed)
 PCP - Dr Rock Pounds Cardiologist - Dr Lonni End APP - Lonni Meager, NP  Chest x-ray - n/a EKG - 06/13/24 Stress Test - 11/19/16 ECHO - scheduled on 08/02/24, last one on 11/08/16 Cardiac Cath - n/a  ICD Pacemaker/Loop - n/a  Sleep Study -  n/a - Sleep study to be scheduled per note dated 06/13/24, written by Lonni Meager, NP.  Diabetes - n/a  Aspirin & Blood Thinner Instructions:  n/a  ERAS - clear liquids til 9 AM DOS.  Anesthesia review: es  STOP now taking any Aspirin (unless otherwise instructed by your surgeon), Aleve , Naproxen , Ibuprofen , Motrin , Advil , Goody's, BC's, all herbal medications, fish oil, and all vitamins.   Coronavirus Screening Do you have any of the following symptoms:  Cough yes/no: No Fever (>100.35F)  yes/no: No Runny nose yes/no: No Sore throat yes/no: No Difficulty breathing/shortness of breath  yes/no: No  Have you traveled in the last 14 days and where? yes/no: No  Patient verbalized understanding of instructions that were given via phone.

## 2024-07-04 ENCOUNTER — Encounter: Payer: Self-pay | Admitting: Sleep Medicine

## 2024-07-04 ENCOUNTER — Encounter (HOSPITAL_COMMUNITY): Payer: Self-pay | Admitting: Vascular Surgery

## 2024-07-04 ENCOUNTER — Ambulatory Visit: Admitting: Sleep Medicine

## 2024-07-04 VITALS — BP 106/66 | HR 86 | Temp 97.7°F | Ht 64.5 in | Wt 143.0 lb

## 2024-07-04 DIAGNOSIS — F5104 Psychophysiologic insomnia: Secondary | ICD-10-CM

## 2024-07-04 DIAGNOSIS — I10 Essential (primary) hypertension: Secondary | ICD-10-CM

## 2024-07-04 DIAGNOSIS — G4733 Obstructive sleep apnea (adult) (pediatric): Secondary | ICD-10-CM

## 2024-07-04 NOTE — Patient Instructions (Signed)
 SABRA

## 2024-07-04 NOTE — Progress Notes (Signed)
 Name:Penny Kim MRN: [id number] DOB: 1971/07/09   CHIEF COMPLAINT:  EXCESSIVE DAYTIME SLEEPINESS   HISTORY OF PRESENT ILLNESS: Penny Kim is a 53 y.o. w/ a h/o HTN, anxiety, depression and avascular necrosis who present for c/o loud snoring, witnessed apnea and excessive daytime sleepiness which has been present for several years. Reports nocturnal awakenings due to dry mouth or due to unclear reasons, however does not have difficulty falling back to sleep. Denies any significant weight changes. Admits to dry mouth. Denies morning headaches, RLS symptoms, dream enactment, cataplexy, hypnagogic or hypnapompic hallucinations. Denies a family history of sleep apnea. Reports occasional drowsy driving. Drinks 1-2 sodas daily, denies tobacco or alcohol use. Former cocaine abuse, denies current illicit drug use.   Bedtime 8-9 pm Sleep onset 2-3 hours Rise time 7:30-10:30 am   EPWORTH SLEEP SCORE 13    07/04/2024    1:49 PM  Results of the Epworth flowsheet  Sitting and reading 1  Watching TV 3  Sitting, inactive in a public place (e.g. a theatre or a meeting) 0  As a passenger in a car for an hour without a break 3  Lying down to rest in the afternoon when circumstances permit 3  Sitting and talking to someone 0  Sitting quietly after a lunch without alcohol 2  In a car, while stopped for a few minutes in traffic 1  Total score 13    PAST MEDICAL HISTORY :   has a past medical history of Allergy, AMS (altered mental status) (01/03/2022), Anemia (01/03/2022), Anxiety, Breast cancer, right breast (HCC) (02/2010), Carboxyhemoglobinemia (01/03/2022), Depression, Dyspnea, Family history of breast cancer, Family history of lung cancer, Family history of ovarian cancer, Genetic testing (10/07/2015), GERD (gastroesophageal reflux disease), History of echocardiogram, History of stress test, HTN (hypertension), Orthostatic hypotension, Pre-diabetes, Psoriasis, and Substance abuse (HCC)  (2017).  has a past surgical history that includes Port-a-cath removal (11/26/2010); Tissue expander removal (10/20/2010); Portacath placement (07/23/2010); Insertion of tissue expander after mastectomy (06/19/2010); Breast surgery (06/19/2010); Breast reconstruction (01/06/2012); Placement of breast implants (Bilateral, 06/16/2023); Breast implant removal (Bilateral, 06/16/2023); Colonoscopy with propofol  (N/A, 10/07/2023); polypectomy (10/07/2023); Capsulectomy (Left, 11/23/2023); and Liposuction with lipofilling (Left, 11/23/2023). Prior to Admission medications   Medication Sig Start Date End Date Taking? Authorizing Provider  acyclovir  (ZOVIRAX ) 400 MG tablet Take 400 mg by mouth 3 (three) times daily. 07/22/22   [provider]  amitriptyline (ELAVIL) 50 MG tablet Take 50 mg by mouth at bedtime as needed for sleep. 08/10/23   [provider]  amLODipine  (NORVASC ) 10 MG tablet Take 1 tablet (10 mg total) by mouth daily. Please schedule appointment for future refills 06/13/24   Vivienne Lonni Ingle, NP  ARIPiprazole (ABILIFY) 5 MG tablet Take 5 mg by mouth daily. 03/20/24   [provider]  budesonide (PULMICORT) 0.5 MG/2ML nebulizer solution 0.5 mg in the morning and at bedtime. Used with saline for nasal rinse 09/09/23   [provider]  clobetasol  ointment (TEMOVATE ) 0.05 % Apply 1 Application topically 2 (two) times daily. 05/28/24   Janit Thresa HERO, DPM  clonazePAM  (KLONOPIN ) 1 MG tablet Take 1 mg by mouth 3 (three) times daily as needed for anxiety. 08/02/23   [provider]  gabapentin  (NEURONTIN ) 300 MG capsule Take 300 mg by mouth 4 (four) times daily as needed (For pain). 01/31/21   [provider]  hydrOXYzine  (ATARAX ) 25 MG tablet Take 25 mg by mouth 3 (three) times daily as needed for  itching or anxiety. 08/10/23   [provider]  ibuprofen  (ADVIL ) 800 MG tablet Take 800 mg by mouth every 8 (eight) hours as needed for moderate pain (pain  score 4-6). 05/01/24   [provider]  TRINTELLIX  20 MG TABS tablet Take 20 mg by mouth at bedtime. 05/28/24   [provider]   Allergies  Allergen Reactions   Prochlorperazine Other (See Comments)    Syncope   Propranolol  Rash   Tape Rash   Wound Dressing Adhesive Rash    FAMILY HISTORY:  family history includes Alcohol abuse in her father; Breast cancer in her paternal grandmother; Cancer in her maternal grandfather; Cancer (age of onset: 87) in her sister; Depression in her father and mother; Diabetes in her maternal grandmother and paternal grandmother; Drug abuse (age of onset: 34) in her father; Heart attack in her mother; Hyperlipidemia in her mother and paternal grandmother; Hypertension in her maternal grandfather, maternal grandmother, mother, and paternal grandmother; Kidney disease in her paternal grandmother. SOCIAL HISTORY:  reports that she has never smoked. She has never used smokeless tobacco. She reports that she does not currently use alcohol. She reports that she does not currently use drugs after having used the following drugs: Cocaine.   Review of Systems:  Gen:  Denies  fever, sweats, chills weight loss  HEENT: Denies blurred vision, double vision, ear pain, eye pain, hearing loss, nose bleeds, sore throat Cardiac:  No dizziness, chest pain or heaviness, chest tightness,edema, No JVD Resp:   No cough, -sputum production, -shortness of breath,-wheezing, -hemoptysis,  Gi: Denies swallowing difficulty, stomach pain, nausea or vomiting, diarrhea, constipation, bowel incontinence Gu:  Denies bladder incontinence, burning urine Ext:   Denies Joint pain, stiffness or swelling Skin: Denies  skin rash, easy bruising or bleeding or hives Endoc:  Denies polyuria, polydipsia , polyphagia or weight change Psych:   Denies depression, insomnia or hallucinations  Other:  All other systems negative  VITAL SIGNS: BP 106/66   Pulse 86   Temp 97.7 F (36.5 C)  (Temporal)   Ht 5' 4.5 (1.638 m)   Wt 143 lb (64.9 kg)   LMP 09/21/2010   SpO2 97%   BMI 24.17 kg/m    Physical Examination:   General Appearance: No distress  EYES PERRLA, EOM intact.   NECK Supple, No JVD Pulmonary: normal breath sounds, No wheezing.  CardiovascularNormal S1,S2.  No m/r/g.   Abdomen: Benign, Soft, non-tender. Skin:   warm, no rashes, no ecchymosis  Extremities: normal, no cyanosis, clubbing. Neuro:without focal findings,  speech normal  PSYCHIATRIC: Mood, affect within normal limits.   ASSESSMENT AND PLAN  OSA I suspect that OSA is likely present due to clinical presentation. Discussed the consequences of untreated sleep apnea. Advised not to drive drowsy for safety of patient and others. Will complete further evaluation with a home sleep study and follow up to review results.    HTN Stable, on current management. Following with PCP.   Insomnia Counseled patient on stimulus control and improving sleep hygiene practices.    MEDICATION ADJUSTMENTS/LABS AND TESTS ORDERED: Recommend Sleep Study   Patient  satisfied with Plan of action and management. All questions answered  Follow up to review HST results and treatment plan.   I spent a total of 46 minutes reviewing chart data, face-to-face evaluation with the patient, counseling and coordination of care as detailed above.    Suann Klier, M.D.  Sleep Medicine [facility]

## 2024-07-04 NOTE — Progress Notes (Signed)
 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 She a history of prior cocaine abuse and developed a large nasal septal perforation with saddle nose deformity with severe nasal crusting. She reported difficulty breathing at night. Above procedure planned.   History includes never smoker, former substance abuse (last cocaine 08/2022), HTN (and orthostatic hypotension), pre-diabetes, syncope (2017), GERD, anemia, psoriasis, right breast cancer (right simple mastectomy with axillary lymph node biopsies, prophylactic left simple mastectomy with placement of bilateral tissue expanders 06/19/2010; left SCV PowerPort 07/23/2010-11/26/2010 for chemo; removal of infected left breat tissue expander 10/22/2010; placement of saline implants 01/06/2012 with implant exchange 06/16/2023, s/p release of left breast capsule contracture 11/23/2023), depression (with SA 03/19/2011).  Per Dr. Vella rigid nasal endoscopy report, findings showed Subtotal nasal septal perforation with preservation of about 12mm caudal strut. Dorsal septum eroded up to ULC's. Severe nasal crusting and dried blood. ITH 3+.   She has been followed by [facility] for history of hypotension and syncope (2017). Last visit on 9/24/205 with Vivienne Bruckner, NP. He recent ENT visit with plans for nasal septal surgery in the future. She reported progressive DOE over the past year with  need to rest after walking about 50 yards. She was not having any chest pain and was euvolemic on exam. She had some snoring and times where she would wake up gasping for air and felt tired throughout the day.  No murmur on exam. He referred her for a sleep evaluation, but also ordered an echo. He did not think she required ischemic testing. Echo is scheduled for 08/02/2024 with additional recommendations based on results. Sleep evaluation is scheduled for 07/04/2024.  Discussed with anesthesiologist Leopoldo Bruckner, MD. She has pending cardiac testing for what sounds like worsening DOE and activity. Would recommend postponing elective surgery until cardiology work-up is completed. I notified LaTasha at Dr. Vella office. She will discuss with him before contacting patient.     VS: Ht 5' 4.5 (1.638 m)   Wt 61.7 kg   LMP 09/21/2010   BMI 22.98 kg/m  Wt Readings from Last 3 Encounters:  06/13/24 62.3 kg  12/01/23 64 kg  11/23/23 64.5 kg   BP Readings from Last 3 Encounters:  06/13/24 110/60  01/04/24 (!) 154/89  12/20/23 108/71   Pulse Readings from Last 3 Encounters:  06/13/24 91  01/04/24 (!) 108  12/20/23 80    PROVIDERS: Buren Rock HERO, MD PCP End, Bruckner, MD is cardiologist - She sleep medicine consult with Jess Cue, MD on 07/04/2024.    LABS: For day of procedure as indicated.    IMAGES: CT Facial Bones 10/18/2023 ([facility]): IMPRESSION: 1. Mild mucosal thickening in the paranasal sinuses.  2. Unchanged nasal septal defect.    EKG: 06/13/2024: Normal sinus rhythm Nonspecific T wave abnormality Confirmed by Vivienne Bruckner [phone]) on 06/13/2024 2:59:43 PM   CV: Nuclear stress  teat 11/19/2016: Blood pressure demonstrated a normal response to exercise. There was no ST segment deviation noted during stress. No T wave inversion was noted during stress. Defect 1: There is a small defect of mild severity present in the apex location. This is likely  due to breast attenuation. The study is normal. This is a low risk study. The left ventricular ejection fraction is normal (55-65%).   Echo 11/08/2016: Study Conclusions  - Left ventricle: The cavity size was normal. Systolic function was    normal. The estimated ejection fraction was in the range of 50%    to 55%. Wall motion was normal; there were no regional wall    motion abnormalities. Left ventricular diastolic function    parameters were normal.  - Left atrium: The atrium was normal in size.  - Right ventricle: Systolic function was normal.  - Pulmonary arteries: Systolic pressure was within the normal    range.  Impressions:  - Sinus bradycardia noted.    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 TTE 10/2016: EF 50-55%, no RWMA, normal LV diastolic function, left atrium normal, RV systolic function normal, PASP normal   History of stress test    a. treadmill Myoview  11/2016: small defect of mild severity present in the apex location felt to be secondary to breast attenuation. EF 55-65%. Normal study   HTN (hypertension)    Orthostatic hypotension    Pre-diabetes    no meds, diet controlled   Psoriasis    Substance abuse (HCC) 2017   cocaine last use 08/2022    Past Surgical History:  Procedure Laterality Date   BREAST IMPLANT REMOVAL Bilateral 06/16/2023   Procedure: REMOVAL BREAST IMPLANTS;  Surgeon: Lowery Estefana RAMAN, DO;   Location: [facility];  Service: Plastics;  Laterality: Bilateral;   BREAST RECONSTRUCTION  01/06/2012   Procedure: BREAST RECONSTRUCTION;  Surgeon: Alm Sick, MD;  Location: [facility];  Service: Plastics;  Laterality: Bilateral;  bilateral removal of tissue expanders, bilateral placement of implants--Breast   BREAST SURGERY  06/19/2010   exploration of right mastectomy site due to post-op bleeding   CAPSULECTOMY Left 11/23/2023   Procedure: left breast capsule release with grafting to left breast;  Surgeon: Lowery Estefana RAMAN, DO;  Location: [facility];  Service: Plastics;  Laterality: Left;   COLONOSCOPY WITH PROPOFOL  N/A 10/07/2023   Procedure: COLONOSCOPY WITH PROPOFOL ;  Surgeon: Unk Corinn Skiff, MD;  Location: [facility];  Service: Gastroenterology;  Laterality: N/A;   INSERTION OF TISSUE EXPANDER AFTER MASTECTOMY  06/19/2010   bilat. mastectomies with bilat. tissue expanders   LIPOSUCTION WITH LIPOFILLING Left 11/23/2023   Procedure: LIPOSUCTION, WITH FAT TRANSFER;  Surgeon: Lowery Estefana RAMAN, DO;  Location: [facility];  Service: Plastics;  Laterality: Left;   PLACEMENT OF BREAST IMPLANTS Bilateral 06/16/2023   Procedure: bilateral  removal and replacement of saline implants;  Surgeon: Lowery Estefana RAMAN, DO;  Location: [facility];  Service: Plastics;  Laterality: Bilateral;   POLYPECTOMY  10/07/2023   Procedure: POLYPECTOMY;  Surgeon: Unk Corinn Skiff, MD;  Location: [facility];  Service: Gastroenterology;;   [facility] REMOVAL  11/26/2010   PORTACATH PLACEMENT  07/23/2010   TISSUE EXPANDER REMOVAL  10/20/2010   left    MEDICATIONS: No current facility-administered medications for this encounter.    acyclovir  (ZOVIRAX ) 400 MG tablet   amitriptyline (ELAVIL) 50 MG tablet   amLODipine  (NORVASC ) 10 MG tablet   ARIPiprazole (ABILIFY) 5 MG tablet   budesonide (PULMICORT) 0.5 MG/2ML nebulizer solution    clobetasol  ointment (TEMOVATE ) 0.05 %   clonazePAM  (KLONOPIN ) 1 MG tablet   gabapentin  (NEURONTIN ) 300 MG capsule   hydrOXYzine  (ATARAX ) 25 MG tablet   ibuprofen  (ADVIL ) 800 MG tablet   TRINTELLIX  20 MG TABS tablet    Isaiah Ruder, PA-C Surgical Short Stay/Anesthesiology [facility] Phone [phone] [facility] Phone [phone] 07/04/2024 2:10 PM

## 2024-07-05 ENCOUNTER — Ambulatory Visit (HOSPITAL_COMMUNITY): Admission: RE | Admit: 2024-07-05 | Source: Home / Self Care | Admitting: Otolaryngology

## 2024-07-05 HISTORY — DX: Dyspnea, unspecified: R06.00

## 2024-07-05 HISTORY — DX: Prediabetes: R73.03

## 2024-07-05 SURGERY — RHINOPLASTY
Anesthesia: General | Laterality: Bilateral

## 2024-07-31 ENCOUNTER — Ambulatory Visit: Admitting: Plastic Surgery

## 2024-08-02 ENCOUNTER — Ambulatory Visit: Attending: Nurse Practitioner

## 2024-08-02 DIAGNOSIS — R0602 Shortness of breath: Secondary | ICD-10-CM | POA: Diagnosis present

## 2024-08-02 LAB — ECHOCARDIOGRAM COMPLETE
AR max vel: 1.72 cm2
AV Area VTI: 1.7 cm2
AV Area mean vel: 1.75 cm2
AV Mean grad: 5 mmHg
AV Peak grad: 10 mmHg
Ao pk vel: 1.58 m/s
Area-P 1/2: 3.72 cm2
S' Lateral: 3.21 cm

## 2024-08-03 ENCOUNTER — Ambulatory Visit: Payer: Self-pay | Admitting: Nurse Practitioner

## 2024-08-03 ENCOUNTER — Ambulatory Visit: Payer: Medicare Other | Admitting: Plastic Surgery

## 2024-08-14 ENCOUNTER — Ambulatory Visit: Admitting: Plastic Surgery

## 2024-08-24 ENCOUNTER — Ambulatory Visit: Admitting: Plastic Surgery

## 2024-08-26 ENCOUNTER — Encounter

## 2024-08-26 DIAGNOSIS — G4733 Obstructive sleep apnea (adult) (pediatric): Secondary | ICD-10-CM

## 2024-09-04 ENCOUNTER — Ambulatory Visit: Admitting: Plastic Surgery

## 2024-09-04 ENCOUNTER — Encounter: Payer: Self-pay | Admitting: Plastic Surgery

## 2024-09-04 DIAGNOSIS — N651 Disproportion of reconstructed breast: Secondary | ICD-10-CM

## 2024-09-04 NOTE — Progress Notes (Signed)
° °  Subjective:    Patient ID: Penny Kim, female    DOB: March 21, 1971, 53 y.o.   MRN: [id number]  The patient is a 53 year old female here for follow-up after undergoing breast reconstruction.  She has bilateral breast implants that are Mentor smooth round high-profile saline 460 cc filled to 550 cc on each side.  This was placed in September 2024.  She had some contracture in the that was released and repaired in March 2025.  Since then she is doing very well.  She still planning on having her nose surgery with Dr. Kathi.  She has not had nipple areola tattooing yet but this is an option for her.      Review of Systems  Constitutional: Negative.   HENT: Negative.    Eyes: Negative.   Respiratory: Negative.    Cardiovascular: Negative.   Gastrointestinal: Negative.   Endocrine: Negative.   Genitourinary: Negative.   Musculoskeletal: Negative.        Objective:   Physical Exam Vitals reviewed.  Constitutional:      Appearance: Normal appearance.  HENT:     Head: Atraumatic.  Cardiovascular:     Rate and Rhythm: Normal rate.     Pulses: Normal pulses.  Pulmonary:     Effort: Pulmonary effort is normal.  Skin:    General: Skin is warm.     Capillary Refill: Capillary refill takes less than 2 seconds.  Neurological:     Mental Status: She is alert and oriented to person, place, and time.  Psychiatric:        Mood and Affect: Mood normal.        Behavior: Behavior normal.        Thought Content: Thought content normal.        Judgment: Judgment normal.         Assessment & Plan:     ICD-10-CM   1. Breast asymmetry following reconstructive surgery  N65.1        Possible nipple areolar tattooing in Spring.  Otherwise follow-up in 1 year.  Pictures were obtained of the patient and placed in the chart with the patient's or guardian's permission.

## 2024-09-17 DIAGNOSIS — R0683 Snoring: Secondary | ICD-10-CM | POA: Diagnosis not present

## 2024-09-17 DIAGNOSIS — G4733 Obstructive sleep apnea (adult) (pediatric): Secondary | ICD-10-CM | POA: Diagnosis not present

## 2024-09-19 ENCOUNTER — Ambulatory Visit: Payer: Self-pay

## 2024-10-01 ENCOUNTER — Telehealth: Payer: Self-pay

## 2024-10-01 DIAGNOSIS — G4733 Obstructive sleep apnea (adult) (pediatric): Secondary | ICD-10-CM

## 2024-10-01 NOTE — Telephone Encounter (Signed)
 Copied from CRM #[id number]. Topic: Clinical - Medical Advice >> Oct 01, 2024  2:05 PM Rozanna MATSU wrote: Reason for CRM: Pt stated she does want to go ahead with the in-lab if it is before the 23rd of January yes please contact her to get it scheduled if before the 23rd  due to surgery, but if after this date it will have to be later.

## 2024-10-03 NOTE — Telephone Encounter (Signed)
 The in lab sleep order has been faxed to [facility]

## 2024-10-05 ENCOUNTER — Other Ambulatory Visit: Payer: Self-pay | Admitting: Otolaryngology

## 2024-10-05 NOTE — Progress Notes (Signed)
 PCP - Dr Rock Pounds Cardiologist -  Dr Lonni End  Chest x-ray - n/a EKG - 06/13/24 Stress Test - 11/19/16 ECHO - 08/02/24 Cardiac Cath - n/a  ICD Pacemaker/Loop - n/a  Sleep Study -  Yes, negative test in 08/2024  Diabetes - n/a  Aspirin & Blood Thinner Instructions:  n/a  ERAS - clear liquids til 8 AM DOS  Anesthesia review: Yes  STOP now taking any Aspirin (unless otherwise instructed by your surgeon), Aleve , Naproxen , Ibuprofen , Motrin , Advil , Goody's, BC's, all herbal medications, fish oil, and all vitamins.   Coronavirus Screening Do you have any of the following symptoms:  Cough yes/no: No Fever (>100.76F)  yes/no: No Runny nose yes/no: No Sore throat yes/no: No Difficulty breathing/shortness of breath  yes/no: No  Have you traveled in the last 14 days and where? yes/no: No  Patient verbalized understanding of instructions that were given to them at the PAT appointment. Patient was also instructed that they will need to review over the PAT instructions again at home before surgery.

## 2024-10-05 NOTE — Pre-Procedure Instructions (Signed)
 Surgical Instructions   Your procedure is scheduled on Friday, January 23rd. Report to [facility] Main Entrance A at 09:00 A.M., then check in with the Admitting office. Any questions or running late day of surgery: call [phone]  Questions prior to your surgery date: call [phone], Monday-Friday, 8am-4pm. If you experience any cold or flu symptoms such as cough, fever, chills, shortness of breath, etc. between now and your scheduled surgery, please notify us  at the above number.     Remember:  Do not eat after midnight the night before your surgery   You may drink clear liquids until 08:00 AM the morning of your surgery.   Clear liquids allowed are: Water, Non-Citrus Juices (without pulp), Carbonated Beverages, Clear Tea (no milk, honey, etc.), Black Coffee Only (NO MILK, CREAM OR POWDERED CREAMER of any kind), and Gatorade.    Take these medicines the morning of surgery with A SIP OF WATER  amLODipine  (NORVASC )  ARIPiprazole (ABILIFY)  budesonide (PULMICORT)    May take these medicines IF NEEDED: acyclovir  (ZOVIRAX )  clonazePAM  (KLONOPIN )  gabapentin  (NEURONTIN )  hydrOXYzine  (ATARAX )    One week prior to surgery, STOP taking any Aspirin (unless otherwise instructed by your surgeon) Aleve , Naproxen , Ibuprofen , Motrin , Advil , Goody's, BC's, all herbal medications, fish oil, and non-prescription vitamins.                     Do NOT Smoke (Tobacco/Vaping) for 24 hours prior to your procedure.  If you use a CPAP at night, you may bring your mask/headgear for your overnight stay.   You will be asked to remove any contacts, glasses, piercing's, hearing aid's, dentures/partials prior to surgery. Please bring cases for these items if needed.    Your surgeon will determine if you are to be admitted or discharged the same day.  Patients discharged the day of surgery will not be allowed to drive home, and someone needs to stay with them for 24 hours.  SURGICAL WAITING ROOM  VISITATION Patients may have no more than 2 support people in the waiting area - these visitors may rotate.   Pre-op nurse will coordinate an appropriate time for 2 ADULT support persons, who may not rotate, to accompany patient in pre-op.  Children under the age of 42 must have an adult with them who is not the patient and must remain in the main waiting area with an adult.  If the patient needs to stay at the hospital during part of their recovery, the visitor guidelines for inpatient rooms apply.  Please refer to the [facility] website for the visitor guidelines for any additional information.   If you received a COVID test during your pre-op visit  it is requested that you wear a mask when out in public, stay away from anyone that may not be feeling well and notify your surgeon if you develop symptoms. If you have been in contact with anyone that has tested positive in the last 10 days please notify you surgeon.      Pre-operative CHG Bathing Instructions   You can play a key role in reducing the risk of infection after surgery. Your skin needs to be as free of germs as possible. You can reduce the number of germs on your skin by washing with CHG (chlorhexidine  gluconate) soap before surgery. CHG is an antiseptic soap that kills germs and continues to kill germs even after washing.   DO NOT use if you have an allergy to chlorhexidine /CHG or antibacterial  soaps. If your skin becomes reddened or irritated, stop using the CHG and notify one of our RNs at [phone].              TAKE A SHOWER THE NIGHT BEFORE SURGERY   Please keep in mind the following:  DO NOT shave, including legs and underarms, 48 hours prior to surgery.   You may shave your face before/day of surgery.  Place clean sheets on your bed the night before surgery Use a clean washcloth (not used since being washed) for shower. DO NOT sleep with pet's night before surgery.  CHG Shower Instructions:  Wash your face and  private area with normal soap. If you choose to wash your hair, wash first with your normal shampoo.  After you use shampoo/soap, rinse your hair and body thoroughly to remove shampoo/soap residue.  Turn the water OFF and apply half the bottle of CHG soap to a CLEAN washcloth.  Apply CHG soap ONLY FROM YOUR NECK DOWN TO YOUR TOES (washing for 3-5 minutes)  DO NOT use CHG soap on face, private areas, open wounds, or sores.  Pay special attention to the area where your surgery is being performed.  If you are having back surgery, having someone wash your back for you may be helpful. Wait 2 minutes after CHG soap is applied, then you may rinse off the CHG soap.  Pat dry with a clean towel  Put on clean pajamas    Additional instructions for the day of surgery: If you choose, you may shower the morning of surgery with an antibacterial soap.  DO NOT APPLY any lotions, deodorants, cologne, or perfumes.   Do not wear jewelry or makeup Do not wear nail polish, gel polish, artificial nails, or any other type of covering on natural nails (fingers and toes) Do not bring valuables to the hospital. [facility] is not responsible for valuables/personal belongings. Put on clean/comfortable clothes.  Please brush your teeth.  Ask your nurse before applying any prescription medications to the skin.

## 2024-10-08 ENCOUNTER — Other Ambulatory Visit: Payer: Self-pay

## 2024-10-08 ENCOUNTER — Encounter (HOSPITAL_COMMUNITY)
Admission: RE | Admit: 2024-10-08 | Discharge: 2024-10-08 | Disposition: A | Discharge status: Home / Self Care | Source: Ambulatory Visit | Attending: Otolaryngology | Admitting: Otolaryngology

## 2024-10-08 ENCOUNTER — Encounter (HOSPITAL_COMMUNITY): Payer: Self-pay

## 2024-10-08 VITALS — BP 119/74 | HR 93 | Temp 98.2°F | Resp 16 | Ht 64.5 in | Wt 140.0 lb

## 2024-10-08 DIAGNOSIS — Z9012 Acquired absence of left breast and nipple: Secondary | ICD-10-CM | POA: Insufficient documentation

## 2024-10-08 DIAGNOSIS — I10 Essential (primary) hypertension: Secondary | ICD-10-CM | POA: Insufficient documentation

## 2024-10-08 DIAGNOSIS — F1411 Cocaine abuse, in remission: Secondary | ICD-10-CM | POA: Diagnosis not present

## 2024-10-08 DIAGNOSIS — Z9011 Acquired absence of right breast and nipple: Secondary | ICD-10-CM | POA: Diagnosis not present

## 2024-10-08 DIAGNOSIS — R7303 Prediabetes: Secondary | ICD-10-CM | POA: Diagnosis not present

## 2024-10-08 DIAGNOSIS — M950 Acquired deformity of nose: Secondary | ICD-10-CM | POA: Insufficient documentation

## 2024-10-08 DIAGNOSIS — Z853 Personal history of malignant neoplasm of breast: Secondary | ICD-10-CM | POA: Diagnosis not present

## 2024-10-08 DIAGNOSIS — J3489 Other specified disorders of nose and nasal sinuses: Secondary | ICD-10-CM | POA: Insufficient documentation

## 2024-10-08 DIAGNOSIS — I951 Orthostatic hypotension: Secondary | ICD-10-CM | POA: Diagnosis not present

## 2024-10-08 DIAGNOSIS — Z01812 Encounter for preprocedural laboratory examination: Secondary | ICD-10-CM | POA: Insufficient documentation

## 2024-10-08 DIAGNOSIS — Z01818 Encounter for other preprocedural examination: Secondary | ICD-10-CM

## 2024-10-08 HISTORY — DX: Dependence on other enabling machines and devices: Z99.89

## 2024-10-08 HISTORY — DX: Herpesviral infection, unspecified: B00.9

## 2024-10-08 NOTE — Progress Notes (Signed)
 Lab unable to run CBC and BMP samples due to mislabeling. Will need to be recollected DOS. Orders entered.

## 2024-10-09 NOTE — Progress Notes (Signed)
 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 She a history of prior cocaine abuse and developed a large nasal septal perforation with saddle nose deformity with severe nasal crusting. She reported difficulty breathing at night. Above procedure initially scheduled for 07/05/2024 but delayed to complete echocardiogram which had been ordered for DOE. Echo was done on 08/02/2024 and showed normal LVEF 60 to 65%, no regional wall motion abnormalities, normal diastolic parameters, no significant valvular disease.     History includes never smoker, former substance abuse (last cocaine 08/2022), HTN (and orthostatic hypotension), pre-diabetes, syncope (2017), GERD, anemia, psoriasis, right breast cancer (right simple mastectomy with axillary lymph node biopsies, prophylactic left simple mastectomy with placement of bilateral tissue expanders 06/19/2010; left SCV PowerPort 07/23/2010-11/26/2010 for chemo; removal of infected left breat tissue expander 10/22/2010; placement of saline implants 01/06/2012 with implant exchange 06/16/2023, s/p release of left breast capsule contracture 11/23/2023), depression (with SA 03/19/2011).   Per Dr. Vella rigid nasal endoscopy report, findings showed Subtotal nasal septal perforation with preservation of about 12mm caudal strut. Dorsal septum eroded up to ULC's. Severe nasal crusting and dried blood. ITH 3+.    She has been followed by [facility] for history of hypotension and syncope (2017). Last  visit on 9/24/205 with Vivienne Bruckner, NP. He recent ENT visit with plans for nasal septal surgery in the future. She reported progressive DOE over the past year with need to rest after walking about 50 yards. She was not having any chest pain and was euvolemic on exam. She had some snoring and times where she would wake up gasping for air and felt tired throughout the day.  No murmur on exam. He referred her for a sleep evaluation, but also ordered an echo. He did not think she required ischemic testing. Echo and sleep evaluation ordered. Echo was done on 08/02/2024 and showed normal LVEF 60 to 65%, no regional wall motion abnormalities, normal diastolic parameters, no significant valvular disease. Home sleep study 08/26/2024 demonstrated normal levels of respiratory breathing with normal levels of oxygen desaturation.  Anesthesia team to evaluate on the day of surgery. Labs was not able to run tubes from PAT due to labeling issues, so scheduled for preoperative labs on the day of surgery.    VS: BP 119/74   Pulse 93   Temp 36.8 C   Resp 16   Ht 5' 4.5 (1.638 m)   Wt 63.5 kg   LMP 09/21/2010   BMI 23.66 kg/m    PROVIDERS: [place] HERO, MD is PCP End, Bruckner, MD is cardiologist Jess Cue, MD is pulmonologist for sleep medicine consult 07/04/2024.    LABS: Labs drawn at PAT could not be run due to mislabeling so repeat labs planned for the day of surgery.   IMAGES: CT Facial Bones 10/18/2023 ([facility]): IMPRESSION: 1. Mild mucosal thickening in the paranasal sinuses.  2. Unchanged nasal septal defect.      EKG: 06/13/2024:  Normal sinus rhythm Nonspecific T wave abnormality Confirmed by Vivienne Bruckner [phone]) on 06/13/2024 2:59:43 PM    CV: Echo 08/02/2024: IMPRESSIONS   1. Left ventricular ejection fraction, by estimation, is 60 to 65%. The  left ventricle has normal function. The left ventricle has no regional  wall motion abnormalities. Left  ventricular diastolic parameters were  normal.   2. Right ventricular systolic function is normal. The right ventricular  size is normal.   3. The mitral valve is normal in structure. No evidence of mitral valve  regurgitation. No evidence of mitral stenosis.   4. The aortic valve is tricuspid. Aortic valve regurgitation is not  visualized. No aortic stenosis is present.   5. The inferior vena cava is normal in size with greater than 50%  respiratory variability, suggesting right atrial pressure of 3 mmHg.    Nuclear stress teat 11/19/2016: Blood pressure demonstrated a normal response to exercise. There was no ST segment deviation noted during stress. No T wave inversion was noted during stress. Defect 1: There is a small defect of mild severity present in the apex location. This is likely due to breast attenuation. The study is normal. This is a low risk study. The left ventricular ejection fraction is normal (55-65%).   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 TTE 10/2016: EF 50-55%, no RWMA, normal LV diastolic function, left atrium normal, RV systolic function normal, PASP normal   History of stress test    a. treadmill Myoview  11/2016: small defect of mild severity present in the apex location felt to be secondary to breast attenuation. EF 55-65%. Normal study   HSV infection    HTN (hypertension)     Orthostatic hypotension    Pre-diabetes    no meds, diet controlled   Psoriasis    Substance abuse (HCC) 2017   cocaine last use 08/2022    Past Surgical History:  Procedure Laterality Date   BREAST IMPLANT REMOVAL Bilateral 06/16/2023   Procedure: REMOVAL BREAST IMPLANTS;  Surgeon: Lowery Estefana RAMAN, DO;  Location: [facility];  Service: Plastics;  Laterality: Bilateral;   BREAST RECONSTRUCTION  01/06/2012   Procedure: BREAST RECONSTRUCTION;  Surgeon: Alm Sick, MD;  Location: [facility];  Service: Plastics;  Laterality: Bilateral;  bilateral removal of tissue expanders, bilateral placement of implants--Breast   BREAST SURGERY  06/19/2010   exploration of right mastectomy site due to post-op bleeding   CAPSULECTOMY Left 11/23/2023   Procedure: left breast capsule release with grafting to left breast;  Surgeon: Lowery Estefana RAMAN, DO;  Location: [facility];  Service: Plastics;  Laterality: Left;   COLONOSCOPY WITH PROPOFOL  N/A 10/07/2023   Procedure: COLONOSCOPY WITH PROPOFOL ;  Surgeon: Unk Corinn Skiff, MD;  Location: [facility];  Service: Gastroenterology;  Laterality: N/A;  INSERTION OF TISSUE EXPANDER AFTER MASTECTOMY  06/19/2010   bilat. mastectomies with bilat. tissue expanders   LIPOSUCTION WITH LIPOFILLING Left 11/23/2023   Procedure: LIPOSUCTION, WITH FAT TRANSFER;  Surgeon: Lowery Estefana RAMAN, DO;  Location: [facility];  Service: Plastics;  Laterality: Left;   PLACEMENT OF BREAST IMPLANTS Bilateral 06/16/2023   Procedure: bilateral removal and replacement of saline implants;  Surgeon: Lowery Estefana RAMAN, DO;  Location: [facility];  Service: Plastics;  Laterality: Bilateral;   POLYPECTOMY  10/07/2023   Procedure: POLYPECTOMY;  Surgeon: Unk Corinn Skiff, MD;  Location: [facility];  Service: Gastroenterology;;   [facility] REMOVAL  11/26/2010   PORTACATH PLACEMENT  07/23/2010   TISSUE EXPANDER  REMOVAL  10/20/2010   left    MEDICATIONS:  acyclovir  (ZOVIRAX ) 400 MG tablet   amitriptyline (ELAVIL) 50 MG tablet   amLODipine  (NORVASC ) 10 MG tablet   ARIPiprazole (ABILIFY) 5 MG tablet   budesonide (PULMICORT) 0.5 MG/2ML nebulizer solution   clobetasol  ointment (TEMOVATE ) 0.05 %   clonazePAM  (KLONOPIN ) 1 MG tablet   gabapentin  (NEURONTIN ) 300 MG capsule   hydrOXYzine  (ATARAX ) 25 MG tablet   TRINTELLIX  20 MG TABS tablet   No current facility-administered medications for this encounter.   Isaiah Ruder, PA-C Surgical Short Stay/Anesthesiology [facility] Phone [phone] [facility] Phone [phone] 10/09/2024 4:34 PM

## 2024-10-09 NOTE — Anesthesia Preprocedure Evaluation (Signed)
 "                                  Anesthesia Evaluation  Patient identified by MRN, date of birth, ID band Patient awake    Reviewed: Allergy & Precautions, H&P , NPO status , Patient's Chart, lab work & pertinent test results  Airway Mallampati: II  TM Distance: >3 FB Neck ROM: Full    Dental  (+) Teeth Intact, Dental Advisory Given   Pulmonary shortness of breath and with exertion Pulmicort used today    Pulmonary exam normal breath sounds clear to auscultation       Cardiovascular hypertension (114/71 preop), Pt. on medications Normal cardiovascular exam Rhythm:Regular Rate:Normal  Echo 2025 1. Left ventricular ejection fraction, by estimation, is 60 to 65%. The  left ventricle has normal function. The left ventricle has no regional  wall motion abnormalities. Left ventricular diastolic parameters were  normal.   2. Right ventricular systolic function is normal. The right ventricular  size is normal.   3. The mitral valve is normal in structure. No evidence of mitral valve  regurgitation. No evidence of mitral stenosis.   4. The aortic valve is tricuspid. Aortic valve regurgitation is not  visualized. No aortic stenosis is present.   5. The inferior vena cava is normal in size with greater than 50%  respiratory variability, suggesting right atrial pressure of 3 mmHg.    Nuclear stress teat 11/19/2016:  Blood pressure demonstrated a normal response to exercise.  There was no ST segment deviation noted during stress.  No T wave inversion was noted during stress.  Defect 1: There is a small defect of mild severity present in the apex location. This is likely due to breast attenuation.  The study is normal.  This is a low risk study.  The left ventricular ejection fraction is normal (55-65%).    Neuro/Psych  PSYCHIATRIC DISORDERS Anxiety Depression    negative neurological ROS     GI/Hepatic ,GERD  Controlled,,(+)     substance abuse (last use  2023)  cocaine use  Endo/Other  diabetes (prediabetic, diet controlled)    Renal/GU negative Renal ROS  negative genitourinary   Musculoskeletal negative musculoskeletal ROS (+)    Abdominal   Peds negative pediatric ROS (+)  Hematology Hb 10.7, plt 286   Anesthesia Other Findings history of prior cocaine abuse and developed a large nasal septal perforation with saddle nose deformity with severe nasal crusting. She reported difficulty breathing at night. Above procedure initially scheduled for 07/05/2024 but delayed to complete echocardiogram which had been ordered for DOE. Echo was done on 08/02/2024 and normal   Restricted R arm   Reproductive/Obstetrics negative OB ROS                              Anesthesia Physical Anesthesia Plan  ASA: 2  Anesthesia Plan: General   Post-op Pain Management: Tylenol  PO (pre-op)* and Dilaudid  IV   Induction: Intravenous  PONV Risk Score and Plan: 4 or greater and Dexamethasone , Midazolam , Treatment may vary due to age or medical condition and Ondansetron   Airway Management Planned: Oral ETT  Additional Equipment: None  Intra-op Plan:   Post-operative Plan: Extubation in OR  Informed Consent: I have reviewed the patients History and Physical, chart, labs and discussed the procedure including the risks, benefits and alternatives for the proposed anesthesia  with the patient or authorized representative who has indicated his/her understanding and acceptance.     Dental advisory given  Plan Discussed with: CRNA  Anesthesia Plan Comments:          Anesthesia Quick Evaluation  "

## 2024-10-12 ENCOUNTER — Ambulatory Visit (HOSPITAL_COMMUNITY)
Admission: RE | Admit: 2024-10-12 | Discharge: 2024-10-12 | Disposition: A | Discharge status: Home / Self Care | Attending: Otolaryngology | Admitting: Otolaryngology

## 2024-10-12 ENCOUNTER — Other Ambulatory Visit: Payer: Self-pay

## 2024-10-12 ENCOUNTER — Ambulatory Visit (HOSPITAL_COMMUNITY)

## 2024-10-12 ENCOUNTER — Encounter (HOSPITAL_COMMUNITY): Payer: Self-pay | Admitting: Otolaryngology

## 2024-10-12 ENCOUNTER — Encounter (HOSPITAL_COMMUNITY)
Admission: RE | Disposition: A | Discharge status: Home / Self Care | Payer: Self-pay | Source: Home / Self Care | Attending: Otolaryngology

## 2024-10-12 ENCOUNTER — Ambulatory Visit (HOSPITAL_COMMUNITY): Payer: Self-pay | Admitting: Physician Assistant

## 2024-10-12 DIAGNOSIS — I10 Essential (primary) hypertension: Secondary | ICD-10-CM | POA: Insufficient documentation

## 2024-10-12 DIAGNOSIS — F32A Depression, unspecified: Secondary | ICD-10-CM | POA: Insufficient documentation

## 2024-10-12 DIAGNOSIS — K219 Gastro-esophageal reflux disease without esophagitis: Secondary | ICD-10-CM | POA: Insufficient documentation

## 2024-10-12 DIAGNOSIS — F1410 Cocaine abuse, uncomplicated: Secondary | ICD-10-CM | POA: Insufficient documentation

## 2024-10-12 DIAGNOSIS — Z01818 Encounter for other preprocedural examination: Secondary | ICD-10-CM

## 2024-10-12 DIAGNOSIS — Z79899 Other long term (current) drug therapy: Secondary | ICD-10-CM | POA: Diagnosis not present

## 2024-10-12 DIAGNOSIS — M950 Acquired deformity of nose: Secondary | ICD-10-CM

## 2024-10-12 DIAGNOSIS — F419 Anxiety disorder, unspecified: Secondary | ICD-10-CM | POA: Diagnosis not present

## 2024-10-12 DIAGNOSIS — J3489 Other specified disorders of nose and nasal sinuses: Secondary | ICD-10-CM | POA: Diagnosis not present

## 2024-10-12 DIAGNOSIS — E119 Type 2 diabetes mellitus without complications: Secondary | ICD-10-CM | POA: Insufficient documentation

## 2024-10-12 LAB — BASIC METABOLIC PANEL WITH GFR
Anion gap: 11 (ref 5–15)
BUN: 7 mg/dL (ref 6–20)
CO2: 22 mmol/L (ref 22–32)
Calcium: 9.2 mg/dL (ref 8.9–10.3)
Chloride: 104 mmol/L (ref 98–111)
Creatinine, Ser: 0.77 mg/dL (ref 0.44–1.00)
GFR, Estimated: >60 mL/min
Glucose, Bld: 90 mg/dL (ref 70–99)
Potassium: 4.1 mmol/L (ref 3.5–5.1)
Sodium: 137 mmol/L (ref 135–145)

## 2024-10-12 LAB — CBC
HCT: 32.9 % — ABNORMAL LOW (ref 36.0–46.0)
Hemoglobin: 10.7 g/dL — ABNORMAL LOW (ref 12.0–15.0)
MCH: 25.5 pg — ABNORMAL LOW (ref 26.0–34.0)
MCHC: 32.5 g/dL (ref 30.0–36.0)
MCV: 78.5 fL — ABNORMAL LOW (ref 80.0–100.0)
Platelets: 286 K/uL (ref 150–400)
RBC: 4.19 MIL/uL (ref 3.87–5.11)
RDW: 15.1 % (ref 11.5–15.5)
WBC: 4.2 K/uL (ref 4.0–10.5)
nRBC: 0 % (ref 0.0–0.2)

## 2024-10-12 MED ORDER — ORAL CARE MOUTH RINSE: 15.0000 mL | Freq: Once | OROMUCOSAL | Status: AC

## 2024-10-12 MED ORDER — ROCURONIUM BROMIDE 10 MG/ML (PF) SYRINGE
PREFILLED_SYRINGE | INTRAVENOUS | Status: DC | PRN
  Administered 2024-10-12: 30 mg via INTRAVENOUS
  Administered 2024-10-12: 50 mg via INTRAVENOUS
  Administered 2024-10-12: 70 mg via INTRAVENOUS

## 2024-10-12 MED ORDER — HYDROMORPHONE HCL 1 MG/ML IJ SOLN
0.2500 mg | INTRAMUSCULAR | Status: DC | PRN
  Administered 2024-10-12 (×2): 0.5 mg via INTRAVENOUS

## 2024-10-12 MED ORDER — ONDANSETRON HCL 4 MG/2ML IJ SOLN
INTRAMUSCULAR | Status: DC | PRN
  Administered 2024-10-12: 4 mg via INTRAVENOUS

## 2024-10-12 MED ORDER — BACITRACIN ZINC 500 UNIT/GM EX OINT
TOPICAL_OINTMENT | CUTANEOUS | Status: DC | PRN
  Administered 2024-10-12: 1 via TOPICAL

## 2024-10-12 MED ORDER — BACITRACIN ZINC 500 UNIT/GM EX OINT
TOPICAL_OINTMENT | CUTANEOUS | Status: AC
  Filled 2024-10-12: qty 28.35

## 2024-10-12 MED ORDER — COCAINE HCL 40 MG/ML NA SOLN
NASAL | Status: AC
  Filled 2024-10-12: qty 4

## 2024-10-12 MED ORDER — ONDANSETRON HCL 4 MG/2ML IJ SOLN: 4.0000 mg | Freq: Once | INTRAMUSCULAR | Status: DC | PRN

## 2024-10-12 MED ORDER — SODIUM CHLORIDE 0.9 % IV SOLN
80.0000 mg | Freq: Once | INTRAVENOUS | Status: DC
  Filled 2024-10-12: qty 2

## 2024-10-12 MED ORDER — EPINEPHRINE (ANAPHYLAXIS) 30 MG/30ML IJ SOLN
INTRAMUSCULAR | Status: DC | PRN
  Administered 2024-10-12: 10 mL

## 2024-10-12 MED ORDER — HYDROMORPHONE HCL 1 MG/ML IJ SOLN
INTRAMUSCULAR | Status: AC
  Filled 2024-10-12: qty 1

## 2024-10-12 MED ORDER — MUPIROCIN 2 % EX OINT
TOPICAL_OINTMENT | CUTANEOUS | Status: AC
  Filled 2024-10-12: qty 22

## 2024-10-12 MED ORDER — LACTATED RINGERS IV SOLN: INTRAVENOUS | Status: DC

## 2024-10-12 MED ORDER — FENTANYL CITRATE (PF) 250 MCG/5ML IJ SOLN
INTRAMUSCULAR | Status: DC | PRN
  Administered 2024-10-12 (×3): 50 ug via INTRAVENOUS
  Administered 2024-10-12: 100 ug via INTRAVENOUS

## 2024-10-12 MED ORDER — PROPOFOL 10 MG/ML IV BOLUS
INTRAVENOUS | Status: DC | PRN
  Administered 2024-10-12: 200 mg via INTRAVENOUS

## 2024-10-12 MED ORDER — EPHEDRINE SULFATE-NACL 50-0.9 MG/10ML-% IV SOSY
PREFILLED_SYRINGE | INTRAVENOUS | Status: DC | PRN
  Administered 2024-10-12: 10 mg via INTRAVENOUS

## 2024-10-12 MED ORDER — AMISULPRIDE (ANTIEMETIC) 5 MG/2ML IV SOLN: 10.0000 mg | Freq: Once | INTRAVENOUS | Status: DC | PRN

## 2024-10-12 MED ORDER — MIDAZOLAM HCL 2 MG/2ML IJ SOLN
INTRAMUSCULAR | Status: AC
  Filled 2024-10-12: qty 2

## 2024-10-12 MED ORDER — SUGAMMADEX SODIUM 200 MG/2ML IV SOLN
INTRAVENOUS | Status: DC | PRN
  Administered 2024-10-12 (×2): 200 mg via INTRAVENOUS

## 2024-10-12 MED ORDER — LIDOCAINE-EPINEPHRINE 1 %-1:100000 IJ SOLN
INTRAMUSCULAR | Status: DC | PRN
  Administered 2024-10-12: 9 mL

## 2024-10-12 MED ORDER — PROPOFOL 10 MG/ML IV BOLUS
INTRAVENOUS | Status: AC
  Filled 2024-10-12: qty 20

## 2024-10-12 MED ORDER — ALBUMIN HUMAN 5 % IV SOLN: INTRAVENOUS | Status: DC | PRN

## 2024-10-12 MED ORDER — EPINEPHRINE HCL (NASAL) 0.1 % NA SOLN
NASAL | Status: AC
  Filled 2024-10-12: qty 60

## 2024-10-12 MED ORDER — ACETAMINOPHEN 500 MG PO TABS
1000.0000 mg | ORAL_TABLET | Freq: Once | ORAL | Status: AC
  Administered 2024-10-12: 1000 mg via ORAL
  Filled 2024-10-12: qty 2

## 2024-10-12 MED ORDER — LIDOCAINE 2% (20 MG/ML) 5 ML SYRINGE
INTRAMUSCULAR | Status: DC | PRN
  Administered 2024-10-12: 60 mg via INTRAVENOUS

## 2024-10-12 MED ORDER — MUPIROCIN 2 % EX OINT
TOPICAL_OINTMENT | CUTANEOUS | Status: DC | PRN
  Administered 2024-10-12: 1 via NASAL

## 2024-10-12 MED ORDER — SODIUM CHLORIDE 0.9 % IV SOLN
INTRAVENOUS | Status: DC | PRN
  Administered 2024-10-12: 80 mg

## 2024-10-12 MED ORDER — OXYCODONE HCL 5 MG/5ML PO SOLN: 5.0000 mg | Freq: Once | ORAL | Status: DC | PRN

## 2024-10-12 MED ORDER — MIDAZOLAM HCL (PF) 2 MG/2ML IJ SOLN
INTRAMUSCULAR | Status: DC | PRN
  Administered 2024-10-12: 2 mg via INTRAVENOUS

## 2024-10-12 MED ORDER — OXYCODONE HCL 5 MG PO TABS: 5.0000 mg | ORAL_TABLET | Freq: Once | ORAL | Status: DC | PRN

## 2024-10-12 MED ORDER — FENTANYL CITRATE (PF) 250 MCG/5ML IJ SOLN
INTRAMUSCULAR | Status: AC
  Filled 2024-10-12: qty 5

## 2024-10-12 MED ORDER — CHLORHEXIDINE GLUCONATE 0.12 % MT SOLN
15.0000 mL | Freq: Once | OROMUCOSAL | Status: AC
  Administered 2024-10-12: 15 mL via OROMUCOSAL
  Filled 2024-10-12: qty 15

## 2024-10-12 MED ORDER — CEFAZOLIN SODIUM-DEXTROSE 2-3 GM-%(50ML) IV SOLR
INTRAVENOUS | Status: DC | PRN
  Administered 2024-10-12: 2 g via INTRAVENOUS

## 2024-10-12 MED ORDER — LIDOCAINE-EPINEPHRINE 1 %-1:100000 IJ SOLN
INTRAMUSCULAR | Status: AC
  Filled 2024-10-12: qty 1

## 2024-10-12 MED ORDER — DEXAMETHASONE SOD PHOSPHATE PF 10 MG/ML IJ SOLN
INTRAMUSCULAR | Status: DC | PRN
  Administered 2024-10-12: 4 mg via INTRAVENOUS

## 2024-10-12 MED ORDER — SODIUM CHLORIDE 0.9 % IR SOLN
Status: DC | PRN
  Administered 2024-10-12: 1000 mL

## 2024-10-12 NOTE — Discharge Instructions (Signed)
 Penny KANDICE CODDINGTON MD  FACIAL PLASTIC AND RECONSTRUCTIVE SURGERY    AFTER RHINOPLASTY SURGERY   After rhinoplasty surgery your nose will have silicone doyle splints secured inside.    The nasal splints will impact breathing through your nose so you may have to breathe through your mouth. Your mouth will become very dry. Please drink as much fluid as you can which will help you from becoming dehydrated. Drinks at the bedside along with a humidifier (cool or warm) may help. You will have a gauze drip pad placed beneath your nose. Change this as needed for the first 24 hours following rhinoplasty surgery. It is not uncommon to change this every 15 minutes for the first several hours following rhinoplasty. If you completely saturate the pad with bright red blood every five minutes, please call your surgeon at the numbers provided.  The cast must remain on your nose for one week after rhinoplasty surgery. It must be kept dry or it could become loose. Notify Dr. Coddington immediately if the cast falls off.  Activity Sleep with head of the bed elevated or use two to three pillows. Sneeze with your mouth open and do not blow your nose of sniff for seven days after your rhinoplasty procedure. Absolutely no bending, lifting or straining. If you have little children, bend at the knees or sit on the floor and let them climb on to your lap.  Ensure that care is taken to keep the cast dry while bathing is important.   Diet Advance diet from liquids to soft food to your regular diet as tolerated. In the immediate rhinoplasty postoperative period, avoid extremely hot liquids or foods if you experience temporary numbness on the roof of the mouth.    Ice During the day and evening of surgery, cold moist compresses are used continuously over the eyes to minimize swelling and control bruising. Puffiness and bruising can occur but if present usually regresses quickly over the next few days. There are several techniques for icing  which are effective. The glove with ice and cool compresses are the preferred methods.  Icing for 24-48 hours is recommended, icing after this period can be used for comfort.  See video on right.   Breathing:  Invariably, there is some nasal stuffiness during the week after surgery. The external edema (swelling) is reflected internally, but the mild blockage will improve steadily. it is imperative to avoid extensive manipulation inside of the nose. DO NOT BLOW THE NOSE. DO NOT USE NOSE DECONGESTANT DROPS (AFFRIN) unless advised by your doctor.   Pain:  Discomfort following rhinoplasty is usually limited to the two or three hours just after the procedure. It may best be described as a headache. The prescription for pain tablets that you have received is more precautionary that necessary, but please have it filled and available at your home bedside. Take pain medicine with milk to avoid any stomach upset. Most patients switch to extra strength Tylenol  on the first day of recovery.  Adult Post-Operative Pain Management  Pain medication is given immediately following your surgery to help with post-operative pain. Upon your discharge home, we suggest scheduled doses of Acetaminophen  (tylenol ) every 6 hours and Ibuprofen  (Motrin ) every 6 hours, alternating between medications every 3 hours (i.e. Take tylenol  and wait 3 hours, then take Motrin  and wait 3 hours, repeat) for the first 3-4 days after surgery. If you are without significant pain, medications can be taken more infrequently. It is important to follow dosing instructions on  the medication bottle or prescription.   Sample of medication dosing schedule  Give dose of: Time: Given:  Acetaminophen  12 a.m.   Ibuprofen  3 a.m.   Acetaminophen  6 a.m.   Ibuprofen  9 a.m.   Acetaminophen  12 p.m.   Ibuprofen  3 p.m.   Acetaminophen  6 p.m.   Ibuprofen  9 p.m.       Medications Most patients complain of pressure from swelling and congestion more than  pain. Use pain medication (most commonly Vicodin/hydrocodone ) as directed/as needed. Vicodin contains Tylenol . Do not take additional Tylenol  or acetaminophen  while taking Vicodin.    Do not drive or drink alcohol while taking pain medication.    Side effects of pain medications can include nausea and constipation. Taking pain medication with food can minimize nausea. Over-the-counter laxatives are indicated if constipation persists.   Start the prescription for swelling medication if prescribed (most commonly Medrol  Dosepak/methyl prednisolone) when you arrive home following rhinoplasty surgery.   The evening following surgery start using the ointment (most commonly polysporin/bacitracin ) two times a day (morning and evening) inside the base of each nostril. Insert only the cotton part of the Q-tip into your nose. Ointment is applied after the morning and evening saline rinses.   Please Remember! Nasal congestion, facial fullness, headache and disrupted sleep are very normal postoperative symptoms for rhinoplasty and will decrease as the healing process occurs. It is not uncommon to have numbness on the roof of the mouth (palate) behind the front teeth. Therefore avoid extremely hot liquids or food in the immediate rhinoplasty postoperative period.  After the First Week: Nasal Appearance:  Prior to your first post-operative visit please allow your nose to get wet in the shower; this will facilitate a more comfortable dressing removal. At the time of nasal splint removal, you will have your chance to see the new nose. It will appear quite swollen but, in most cases, even in this swollen condition, the improvement can be appreciated. It takes time for the skin and soft tissue to adhere to the new framework.  During your postoperative visits Dr. Luciano may use medicines to help contour the shape of your nose.    Final results following rhinoplasty are not apparent for one full year following  surgery.  After three months, the changes are ever so subtle, although still important. Being perfectionists about our work, you may tell us  you are pleased long before the one year anniversary. However, we request that you follow-up with us  at that time for postoperative rhinoplasty photographs and so that we can enjoy your final result.    Activity Sleep with head of the bed elevated or use two to three pillows for three weeks after surgery.  Three weeks after surgery you may resume full activity without medical restrictions.  It is advised that you resume your work-out regimen slowly, as your body may fatigue a little easier than usual.  Wearing Glasses:  You should not wear glasses for at least one month. If glasses must be worn, taping the central bridge of the glasses to the forehead will allow as little pressure as possible on the nasal bones.    Sun Exposure:  Your skin should be protected from sun exposure for at least six months after surgery. A sunburn will cause the nose to swell dramatically and delay the final result. Austin avoidance or protection with a hat is preferred.  You may begin wearing sun block three weeks after surgery. Factor 30SPF sun block with both UVA and UVB  protection.     General Postoperative Medication Checklist 1.            Pain reliever    2.            Medrol  Dose Pack (possible)   3.            Nasal saline spray: purchased over the counter   4.            Antibiotic ointment: purchased over the counter   AFTER SURGERY CHECKLIST Immediately after the procedure  You must meet nursing criteria for discharge home; walking, voiding, talking. Your nose may have small packings inside the nostrils.  These control any bleeding after surgery but may partially obstruct your nasal breathing.     The First 24 Hours 1.            Wound care: cleaning the sutures with a Q-tip dipped in saline water and then apply the antibiotic ointment (polysporin/bacitracin )  twice a day.   2.            Activity: sleep with head of the bed elevated or use two to three pillows.  No strenuous activity / aerobics / yoga / heavy lifting for 3 weeks after surgery.   3.            Bathing is ok as long as you don't get your incisions wet for a minimum of four days after surgery.   4.            Advance diet from liquids to soft food to your regular diet as tolerated.   5.            Cold compresses are used continuously over the forehead to minimize swelling and control bruising (30 minutes on, 15 minutes off) for the first 48 hours after surgery.   6.            Medications: use pain medication as necessary.  You can have a maximum of 8 tablets of vicodin + Tylenol  in a 24 hour period (6 vicodin + 2 Tylenol  = 8, ect).     7.            Medications: Start Medrol  Dosepak/methyl prednisolone when you arrive home and feeling ok to take medicine.   8.            Medications: Start the nasal saline spray 1 day after surgery.     One Week After Surgery 1.            Make-up can be started 1 week after surgery to camouflage any bruising or redness.   2.            Avoid sun exposure for three weeks after surgery, then use sun block.   3.            The tip of the nose and sometimes the front teeth will be numb to touch following surgery.  This will improve in the first few weeks to months after surgery.   4.            Swelling, bruising and disrupted sleep are very normal postoperative symptoms and will decrease as the healing process occurs.   5.            Differential swelling may asymmetries of the right and left sides of your nose.  As the swelling goes away, so will these asymmetries.  Please be patient.   If you need to  call after clinic hours for a concern, call [phone]  [facility] [address] [place], [place], [address] [phone]

## 2024-10-12 NOTE — Anesthesia Postprocedure Evaluation (Signed)
"   Anesthesia Post Note  Patient: DONYALE FALCON  Procedure(s) Performed: RHINOPLASTY PROCEDURE, BONE GRAFT     Patient location during evaluation: PACU Anesthesia Type: General Level of consciousness: awake and alert, oriented and patient cooperative Pain management: pain level controlled Vital Signs Assessment: post-procedure vital signs reviewed and stable Respiratory status: spontaneous breathing, nonlabored ventilation and respiratory function stable Cardiovascular status: blood pressure returned to baseline and stable Postop Assessment: no apparent nausea or vomiting Anesthetic complications: no   No notable events documented.  Last Vitals:  Vitals:   10/12/24 1600 10/12/24 1615  BP: 127/63 127/74  Pulse: 97 86  Resp: (!) 21 11  Temp:    SpO2: 100% 96%    Last Pain:  Vitals:   10/12/24 1615  TempSrc:   PainSc: Asleep                 Almarie HERO Ollie Esty      "

## 2024-10-12 NOTE — Op Note (Signed)
 OPERATIVE NOTE  Penny Kim Date/Time of Admission: 10/12/2024  9:00 AM  CSN: [id number];FMW:[id number] Attending Provider: Luciano Standing, MD Room/Bed: MCPO/NONE DOB: 02-28-1971 Age: 54 y.o.   Pre-Op Diagnosis: Acquired deformity of nose; Nasal septal perforation; Nasal crusting  Post-Op Diagnosis: Acquired deformity of nose; Nasal septal perforation; Nasal crusting  Procedure: Rhinoplasty 30410 with use of cadaveric rib graft   Anesthesia: General  Surgeon(s): Standing KANDICE Luciano, MD  Staff: Circulator: Primus Leita HERO, RN Relief Circulator: Bari Birmingham, RN Scrub Person: Kassie Charliene DASEN, RN RN First Assistant: Sherrine Moats, RN  Implants: Implant Name Type Inv. Item Serial No. Manufacturer Lot No. LRB No. 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 Intra-nasal synechiae , severe, with total nasal septal perforation  Open rhinoplasty techniques Use of cadaveric rib graft to create 2.5cm notched (notch placed beneath caudal edge of nasal bones at [facility]) extended spreader grafts secured to nasal bones with 5-0 PDS after drilling two separate anchor holes. Upper lateral cartilage remnant beneath dorsal reconstruction.  Caudal septal replacement graft 20x61mm secured tongue in groove to extended spreader grafts with 5-0 PDS mattress sutures.  Tongue-in-groove 5-0 PDS and 4-0 plain gut for tip reconstruction.  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  The patient's nasal hygiene and chronic crusting has been under medical management since September 09, 2023.  The patient has requested reconstruction of her saddlenose deformity. I explicitly informed the patient that surgery may have no impact on breathing. This surgery will NOT repair the septal perforation which is too large for surgical repair. Middle vault widening may occur as a sequelae of reconstruction.    R/B/A/ discussed. Risks discussed in detail including pain, bleeding, infection, nasal deformity, poor cosmesis, nasal tip firmness, nasal numbness, graft infection/failure/extrusion, failure to relieve symptoms, nasal septal perforation, nasal synechiae, need for further surgery, risks of anesthesia. Despite these risks the patient requested to proceed.     Description of Operation:  The patient was identified in the pre-operative area and consent confirmed in the chart. The patient was brought to the operating room by the anesthetist and a pre-operative huddle was performed confirming the patient's identity and procedure to be performed. Once all were in agreement we proceeded with surgery. General anesthesia was induced and the patient was intubated with an oral endotracheal tube. The patient was turned 180 degrees from the anesthetist. The patients external nose was examined with the findings as noted above. 1:1000 epinephrine  pledgets were placed into bilateral nasal cavities and the vibrissae were trimmed with bacitracin  coated curved iris scissors.  Next the columella, marginal incisions, nasal spine, nasal tip and dorsum were further anesthetized 1% lidocaine  1:100 K epi.   The patient was prepped and draped in standard fashion for rhinoplasty.   Next we proceeded with an open septorhinoplasty with inverted V  trans-columellar incision. A 15 blade was used to make the lateral columellar incisions  and a littler scissor used to dissect between the medial crura and the skin/soft tissue envelope. Once a full pocket was dissected a 15 blade was use complete the inverted V incisions. Bleeding from the columellar vessels was controlled with monopolar cautery. Double pronged skin hooks were used to provide optimal tension/counter-tension and the marginal incisions were created by dissecting carefully along the medial crura, intermediate crura and lateral crura on the left side in a sub-mucoperiochondrial plane. Once the left lower lateral cartilage [facility]) was exposed we proceeded with skeletonizing the right LLC in similar fashion taking care to avoid violating the structural integrity. The tip soft tissues were elevated off of the domal region and we proceeded to identify the anterior septal angle. The inter-domal lingaments were released. The ULC's were dissected in a submucoperichondrial plane up to the rhinion. A joseph elevator was used to elevate in a subperiosteal plane and a Aufricht retractor was placed, which was later transitioned to a converse retractor.   During this dissection the pateints devastating anatomic deformity and saddle collapse became strikingly apparents. The keystone was totally collapsed and the remaining ULC's were inferiorly displaced. There was no residual dorsal septal mucosa for dissection therefore I could not safely divide the upper lateral cartilages and incorporate them into the reconstruction without high risk of graft exposure and and infection from nasal flora.   I dissected beneath the nasal bones at the [facility] region to create a subperiosteal pocket for notched extended spreader grafts. A monocortical drill bit on REM B Driver was used to create two separate anchor holes in the nasal bones from the extended spreader grafts.   Next a [employer] frozen cadaveric rib graft was  thawed, brought into the field and soaked in gentamicin infused saline (80mg /500 mL). The graft was carved using oblique split technique with a deramtome blade. Notched extended spreader grafts 25x6x9mm were secured to the nasal bones with 5-0PDS Suture. A spreader graft was placed between them and secured with 5-0 PDS to close the dead space. This dorsal recon was placed aboved the ULC's.   Next a caudal setpal replacement graft 20x8x82mm was created and secured to the ANS periosteum with 4-0 PDS suture. Tongue-in-groove with 5-0 pds mattress sutures to the exneded spreader grafts was used to control the patient's nasal length, rotation and projection. The goal was achieved - which was to restore the nasal profile without creating any dramatic changes to the nasal tip.     To address the narrow and asymmetric middle vault/internal nasal valves deviated concave (right) middle 1/3, asymmetric extended spreader grafts 20x77mm (right), 20x56mm (left) were placed between the ULC and cartilaginous dorsum on the right side and secured with 5-0 PDS improving the internal nasal valve patency and symmetry of the middle 1/3 cartilaginous vault. The ULC's were re-secured to the septum/septal flaps with interrupted 5-0 PDS mattress suture.   Next the medial crura were quilted to the CSEG in a tongue-in-groove fashion with 4-0 plain gut suture and 5-0 PDS - preserving the patient's pre-existing rotation and projection.   Interdomal and 5-0 PDS sutures were used to improve symmetry of the LLC's. A inter-crural spanning suture was loosely placed.  The skin soft tissue envelope was re-draped demonstrating satisfactory changes to the middle and lower thirds of the nose.   Throughout the procedure the nose was copiously irrigated with gentamicin infused saline.    The inverted V incision was closed with buried interrupted 6-0 monocryl and interrupted 5-0 fast gut suture. The marginal  incisions were closed with interrupted  5-0 chromic gut suture.    The dorsum was dressed with brown paper tape and thermaplast splint. A dressing consisting of bacitracin , telfa, and brown paper tape was applied.    The patient was turned back to the anesthetist who extubated and brought the patient to the recovery room in stable condition. All counts were correct and final.   Plan: - Post-op ciprofloxacin, mupirocin ointment and dressing x 1 week    Elspeth KANDICE Coddington, MD [facility]  10/12/2024

## 2024-10-12 NOTE — H&P (Signed)
 Penny Kim is an 54 y.o. female.    Chief Complaint:  Acquired deformity of nose  HPI: Patient presents today for planned elective procedure.  He/she denies any interval change in history since office visit on 06/12/24.  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 TTE 10/2016: EF 50-55%, no RWMA, normal LV diastolic function, left atrium normal, RV systolic function normal, PASP normal   History of stress test    a. treadmill Myoview  11/2016: small defect of mild severity present in the apex location felt to be secondary to breast attenuation. EF 55-65%. Normal study   HSV infection    HTN (hypertension)    Orthostatic hypotension    Pre-diabetes    no meds, diet controlled   Psoriasis    Substance abuse (HCC) 2017   cocaine last use 08/2022    Past Surgical History:  Procedure Laterality Date   BREAST IMPLANT REMOVAL Bilateral 06/16/2023   Procedure: REMOVAL BREAST IMPLANTS;  Surgeon: Lowery Estefana RAMAN, DO;  Location: [facility];  Service: Plastics;  Laterality: Bilateral;   BREAST RECONSTRUCTION  01/06/2012   Procedure: BREAST RECONSTRUCTION;  Surgeon: Alm Sick, MD;  Location: [facility];  Service: Plastics;  Laterality: Bilateral;  bilateral removal of tissue expanders, bilateral placement of implants--Breast   BREAST SURGERY  06/19/2010   exploration of right mastectomy site  due to post-op bleeding   CAPSULECTOMY Left 11/23/2023   Procedure: left breast capsule release with grafting to left breast;  Surgeon: Lowery Estefana RAMAN, DO;  Location: [facility];  Service: Plastics;  Laterality: Left;   COLONOSCOPY WITH PROPOFOL  N/A 10/07/2023   Procedure: COLONOSCOPY WITH PROPOFOL ;  Surgeon: Unk Corinn Skiff, MD;  Location: [facility];  Service: Gastroenterology;  Laterality: N/A;   INSERTION OF TISSUE EXPANDER AFTER MASTECTOMY  06/19/2010   bilat. mastectomies with bilat. tissue expanders   LIPOSUCTION WITH LIPOFILLING Left 11/23/2023   Procedure: LIPOSUCTION, WITH FAT TRANSFER;  Surgeon: Lowery Estefana RAMAN, DO;  Location: [facility];  Service: Plastics;  Laterality: Left;   PLACEMENT OF BREAST IMPLANTS Bilateral 06/16/2023   Procedure: bilateral removal and replacement of saline implants;  Surgeon: Lowery Estefana RAMAN, DO;  Location: [facility];  Service: Plastics;  Laterality: Bilateral;   POLYPECTOMY  10/07/2023   Procedure: POLYPECTOMY;  Surgeon: Unk Corinn Skiff, MD;  Location: [facility];  Service: Gastroenterology;;   [facility] REMOVAL  11/26/2010   PORTACATH PLACEMENT  07/23/2010   TISSUE EXPANDER REMOVAL  10/20/2010   left    Family History  Problem Relation Age of Onset   Hypertension Mother    Depression Mother    Heart attack Mother    Hyperlipidemia Mother    Cancer Sister 62       Ovarian cancer   Hypertension  Maternal Grandmother    Diabetes Maternal Grandmother    Cancer Maternal Grandfather        Lung cancer - education officer, environmental and smoker   Hypertension Maternal Grandfather    Diabetes Paternal Grandmother    Hypertension Paternal Grandmother    Kidney disease Paternal Grandmother        End stage renal dz - dialysis   Hyperlipidemia Paternal Grandmother    Breast cancer Paternal Grandmother    Alcohol abuse Father    Depression Father    Drug abuse Father 36       Died of drug overdose     Social History:  reports that she has never smoked. She has never used smokeless tobacco. She reports that she does not currently use alcohol. She reports that she does not currently use drugs after having used the following drugs: Cocaine.  Allergies: Allergies[1]  Medications Prior to Admission  Medication Sig Dispense Refill   acyclovir  (ZOVIRAX ) 400 MG tablet Take 400 mg by mouth daily as needed (outbreaks).     amLODipine  (NORVASC ) 10 MG tablet Take 1 tablet (10 mg total) by mouth daily. Please schedule appointment for future refills 90 tablet 3   ARIPiprazole (ABILIFY) 5 MG tablet Take 5 mg by mouth daily.     budesonide (PULMICORT) 0.5 MG/2ML nebulizer solution Take 0.5 mg by nebulization in the morning and at bedtime. Used with saline for nasal rinse     clobetasol  ointment (TEMOVATE ) 0.05 % Apply 1 Application topically 2 (two) times daily. 45 g 2   clonazePAM  (KLONOPIN ) 1 MG tablet Take 1 mg by mouth 3 (three) times daily as needed for anxiety.     gabapentin  (NEURONTIN ) 300 MG capsule Take 300 mg by mouth 4 (four) times daily as needed (For pain).     hydrOXYzine  (ATARAX ) 25 MG tablet Take 25 mg by mouth 3 (three) times daily as needed for itching or anxiety.     TRINTELLIX  20 MG TABS tablet Take 20 mg by mouth at bedtime.     amitriptyline (ELAVIL) 50 MG tablet Take 50 mg by mouth at bedtime as needed (nightmares).      Results for orders placed or performed during the hospital encounter of 10/12/24 (from the past 48 hours)  Basic metabolic panel     Status: None   Collection Time: 10/12/24  9:45 AM  Result Value Ref Range   Sodium 137 135 - 145 mmol/L   Potassium 4.1 3.5 - 5.1 mmol/L   Chloride 104 98 - 111 mmol/L   CO2 22 22 - 32 mmol/L   Glucose, Bld 90 70 - 99 mg/dL    Comment: Glucose reference range applies only to samples taken after fasting for at least 8 hours.   BUN 7 6 - 20 mg/dL   Creatinine, Ser 9.22 0.44 - 1.00 mg/dL   Calcium 9.2 8.9 - 89.6 mg/dL   GFR,  Estimated >39 >39 mL/min    Comment: (NOTE) Calculated using the CKD-EPI Creatinine Equation (2021)    Anion gap 11 5 - 15    Comment: Performed at [facility], [address] [place], [place] [address]  CBC     Status: Abnormal   Collection Time: 10/12/24  9:45 AM  Result Value Ref Range   WBC 4.2 4.0 - 10.5 K/uL   RBC 4.19 3.87 - 5.11 MIL/uL   Hemoglobin 10.7 (L) 12.0 - 15.0 g/dL   HCT 67.0 (L) 63.9 - 53.9 %   MCV  78.5 (L) 80.0 - 100.0 fL   MCH 25.5 (L) 26.0 - 34.0 pg   MCHC 32.5 30.0 - 36.0 g/dL   RDW 84.8 88.4 - 84.4 %   Platelets 286 150 - 400 K/uL   nRBC 0.0 0.0 - 0.2 %    Comment: Performed at [facility], [address] [place], [place] [address]   No results found.  ROS: negative other than stated in HPI  Blood pressure 114/71, pulse 75, temperature 99.3 F (37.4 C), temperature source Oral, resp. rate 18, height 5' 4.5 (1.638 m), weight 63.5 kg, last menstrual period 09/21/2010, SpO2 99%.  PHYSICAL EXAM: General: Resting comfortably in NAD  Lungs: Non-labored respiratinos  Studies Reviewed: None   Assessment/Plan Saddle nose deformity Nasal septal perforation History of cocaine abuse  Proceed with Rhinoplasty with use of cadaveric rib graft for dorsal reconstruction. Explicitly informed patient this surgery may have no impact on breathing. This surgery will NOT repair the septal perforation which is too large for surgical repair.   R/B/A/ discussed. Risks discussed in detail including pain, bleeding, infection, nasal deformity, poor cosmesis, nasal tip firmness, nasal numbness, graft infection/failure/extrusion, failure to relieve symptoms, nasal septal perforation, nasal synechiae, need for further surgery, risks of anesthesia. Despite these risks the patient requested to proceed.     Electronically signed by:  Elspeth Coddington, MD  Facial Plastic & Reconstructive Surgery Otolaryngology - Head and Neck Surgery [facility]  10/12/2024, 11:12 AM       [1]  Allergies Allergen Reactions   Prochlorperazine Other (See Comments)    Syncope   Propranolol  Rash   Tape Rash   Wound Dressing Adhesive Rash

## 2024-10-12 NOTE — Anesthesia Procedure Notes (Signed)
 Procedure Name: Intubation Date/Time: 10/12/2024 11:59 AM  Performed by: Lockie Flesher, CRNAPre-anesthesia Checklist: Patient identified, Emergency Drugs available, Suction available and Patient being monitored Patient Re-evaluated:Patient Re-evaluated prior to induction Oxygen Delivery Method: Circle System Utilized Preoxygenation: Pre-oxygenation with 100% oxygen Induction Type: IV induction Ventilation: Mask ventilation without difficulty Laryngoscope Size: Mac and 3 Grade View: Grade I Tube type: Oral Tube size: 7.0 mm Number of attempts: 1 Airway Equipment and Method: Stylet and Oral airway Placement Confirmation: ETT inserted through vocal cords under direct vision, positive ETCO2 and breath sounds checked- equal and bilateral Secured at: 22 cm Tube secured with: Tape Dental Injury: Teeth and Oropharynx as per pre-operative assessment

## 2024-10-12 NOTE — Transfer of Care (Signed)
 Immediate Anesthesia Transfer of Care Note  Patient: Penny Kim  Procedure(s) Performed: RHINOPLASTY PROCEDURE, BONE GRAFT  Patient Location: PACU  Anesthesia Type:General  Level of Consciousness: awake, alert , and oriented  Airway & Oxygen Therapy: Patient Spontanous Breathing and Patient connected to nasal cannula oxygen  Post-op Assessment: Report given to RN and Post -op Vital signs reviewed and stable  Post vital signs: Reviewed and stable  Last Vitals:  Vitals Value Taken Time  BP 125/69 10/12/24 15:47  Temp    Pulse 111 10/12/24 15:50  Resp 15 10/12/24 15:50  SpO2 100 % 10/12/24 15:50  Vitals shown include unfiled device data.  Last Pain:  Vitals:   10/12/24 0938  TempSrc:   PainSc: 0-No pain         Complications: No notable events documented.

## 2024-10-15 ENCOUNTER — Encounter (HOSPITAL_COMMUNITY): Payer: Self-pay | Admitting: Otolaryngology

## 2024-12-18 ENCOUNTER — Encounter: Admitting: Plastic Surgery

## 2025-01-04 ENCOUNTER — Encounter: Admitting: Plastic Surgery
